# Patient Record
Sex: Female | Born: 1965
Health system: Southern US, Community
[De-identification: ages and names within clinical notes are randomized; demographics above are authoritative.]

## PROBLEM LIST (undated history)

## (undated) DIAGNOSIS — N183 Chronic kidney disease, stage 3 unspecified: Secondary | ICD-10-CM

## (undated) DIAGNOSIS — E079 Disorder of thyroid, unspecified: Secondary | ICD-10-CM

## (undated) DIAGNOSIS — M5136 Other intervertebral disc degeneration, lumbar region: Secondary | ICD-10-CM

## (undated) DIAGNOSIS — L309 Dermatitis, unspecified: Secondary | ICD-10-CM

## (undated) DIAGNOSIS — M545 Low back pain, unspecified: Secondary | ICD-10-CM

## (undated) DIAGNOSIS — M51369 Other intervertebral disc degeneration, lumbar region without mention of lumbar back pain or lower extremity pain: Secondary | ICD-10-CM

## (undated) DIAGNOSIS — K219 Gastro-esophageal reflux disease without esophagitis: Secondary | ICD-10-CM

## (undated) DIAGNOSIS — N3010 Interstitial cystitis (chronic) without hematuria: Secondary | ICD-10-CM

## (undated) DIAGNOSIS — R569 Unspecified convulsions: Secondary | ICD-10-CM

## (undated) DIAGNOSIS — Z860100 Personal history of colon polyps, unspecified: Secondary | ICD-10-CM

## (undated) DIAGNOSIS — T7840XA Allergy, unspecified, initial encounter: Secondary | ICD-10-CM

## (undated) DIAGNOSIS — I10 Essential (primary) hypertension: Secondary | ICD-10-CM

## (undated) DIAGNOSIS — E785 Hyperlipidemia, unspecified: Secondary | ICD-10-CM

## (undated) DIAGNOSIS — T783XXA Angioneurotic edema, initial encounter: Secondary | ICD-10-CM

## (undated) DIAGNOSIS — Z5189 Encounter for other specified aftercare: Secondary | ICD-10-CM

## (undated) DIAGNOSIS — G039 Meningitis, unspecified: Secondary | ICD-10-CM

## (undated) HISTORY — PX: BRAIN TUMOR EXCISION: SHX577

## (undated) HISTORY — DX: Personal history of colon polyps, unspecified: Z86.0100

## (undated) HISTORY — DX: Disorder of thyroid, unspecified: E07.9

## (undated) HISTORY — PX: TONSILLECTOMY: SUR1361

## (undated) HISTORY — DX: Gastro-esophageal reflux disease without esophagitis: K21.9

## (undated) HISTORY — DX: Other intervertebral disc degeneration, lumbar region: M51.36

## (undated) HISTORY — DX: Meningitis, unspecified: G03.9

## (undated) HISTORY — DX: Angioneurotic edema, initial encounter: T78.3XXA

## (undated) HISTORY — PX: ABDOMINAL HYSTERECTOMY: SHX81

## (undated) HISTORY — DX: Low back pain: M54.5

## (undated) HISTORY — DX: Allergy, unspecified, initial encounter: T78.40XA

## (undated) HISTORY — DX: Other intervertebral disc degeneration, lumbar region without mention of lumbar back pain or lower extremity pain: M51.369

## (undated) HISTORY — PX: CHOLECYSTECTOMY: SHX55

## (undated) HISTORY — DX: Interstitial cystitis (chronic) without hematuria: N30.10

## (undated) HISTORY — DX: Encounter for other specified aftercare: Z51.89

## (undated) HISTORY — DX: Unspecified convulsions: R56.9

## (undated) HISTORY — PX: OTHER SURGICAL HISTORY: SHX169

## (undated) HISTORY — DX: Essential (primary) hypertension: I10

## (undated) HISTORY — DX: Dermatitis, unspecified: L30.9

## (undated) HISTORY — DX: Chronic kidney disease, stage 3 unspecified: N18.30

## (undated) HISTORY — PX: POLYPECTOMY: SHX149

## (undated) HISTORY — DX: Low back pain, unspecified: M54.50

## (undated) HISTORY — PX: TUBAL LIGATION: SHX77

## (undated) HISTORY — DX: Personal history of colonic polyps: Z86.010

## (undated) HISTORY — DX: Hyperlipidemia, unspecified: E78.5

---

## 1997-12-15 ENCOUNTER — Emergency Department (HOSPITAL_COMMUNITY): Admission: EM | Admit: 1997-12-15 | Discharge: 1997-12-15 | Payer: Self-pay | Admitting: Emergency Medicine

## 1997-12-17 ENCOUNTER — Emergency Department (HOSPITAL_COMMUNITY): Admission: EM | Admit: 1997-12-17 | Discharge: 1997-12-17 | Payer: Self-pay | Admitting: Emergency Medicine

## 1999-03-14 ENCOUNTER — Emergency Department (HOSPITAL_COMMUNITY): Admission: EM | Admit: 1999-03-14 | Discharge: 1999-03-14 | Payer: Self-pay | Admitting: Emergency Medicine

## 1999-08-01 ENCOUNTER — Encounter: Payer: Self-pay | Admitting: Emergency Medicine

## 1999-08-01 ENCOUNTER — Emergency Department (HOSPITAL_COMMUNITY): Admission: EM | Admit: 1999-08-01 | Discharge: 1999-08-01 | Payer: Self-pay | Admitting: Emergency Medicine

## 2000-03-09 ENCOUNTER — Ambulatory Visit (HOSPITAL_COMMUNITY): Admission: RE | Admit: 2000-03-09 | Discharge: 2000-03-09 | Payer: Self-pay | Admitting: Family Medicine

## 2000-06-09 ENCOUNTER — Encounter: Payer: Self-pay | Admitting: Family Medicine

## 2000-06-09 ENCOUNTER — Encounter: Admission: RE | Admit: 2000-06-09 | Discharge: 2000-06-09 | Payer: Self-pay | Admitting: Family Medicine

## 2001-05-04 ENCOUNTER — Encounter: Admission: RE | Admit: 2001-05-04 | Discharge: 2001-05-04 | Payer: Self-pay | Admitting: Chiropractic Medicine

## 2001-05-04 ENCOUNTER — Encounter: Payer: Self-pay | Admitting: Chiropractic Medicine

## 2002-02-16 ENCOUNTER — Encounter: Payer: Self-pay | Admitting: Internal Medicine

## 2002-02-16 ENCOUNTER — Ambulatory Visit (HOSPITAL_COMMUNITY): Admission: RE | Admit: 2002-02-16 | Discharge: 2002-02-16 | Payer: Self-pay | Admitting: Internal Medicine

## 2002-02-17 ENCOUNTER — Encounter: Payer: Self-pay | Admitting: Internal Medicine

## 2002-02-17 ENCOUNTER — Ambulatory Visit (HOSPITAL_COMMUNITY): Admission: RE | Admit: 2002-02-17 | Discharge: 2002-02-17 | Payer: Self-pay | Admitting: Internal Medicine

## 2002-07-28 ENCOUNTER — Encounter: Admission: RE | Admit: 2002-07-28 | Discharge: 2002-07-28 | Payer: Self-pay | Admitting: Gastroenterology

## 2002-07-28 ENCOUNTER — Encounter: Payer: Self-pay | Admitting: Gastroenterology

## 2002-09-12 ENCOUNTER — Encounter: Payer: Self-pay | Admitting: Emergency Medicine

## 2002-09-12 ENCOUNTER — Emergency Department (HOSPITAL_COMMUNITY): Admission: EM | Admit: 2002-09-12 | Discharge: 2002-09-12 | Payer: Self-pay | Admitting: Emergency Medicine

## 2002-10-28 ENCOUNTER — Encounter: Payer: Self-pay | Admitting: Emergency Medicine

## 2002-10-28 ENCOUNTER — Emergency Department (HOSPITAL_COMMUNITY): Admission: EM | Admit: 2002-10-28 | Discharge: 2002-10-29 | Payer: Self-pay | Admitting: Emergency Medicine

## 2003-02-10 ENCOUNTER — Emergency Department (HOSPITAL_COMMUNITY): Admission: EM | Admit: 2003-02-10 | Discharge: 2003-02-10 | Payer: Self-pay

## 2003-11-24 ENCOUNTER — Ambulatory Visit (HOSPITAL_BASED_OUTPATIENT_CLINIC_OR_DEPARTMENT_OTHER): Admission: RE | Admit: 2003-11-24 | Discharge: 2003-11-24 | Payer: Self-pay | Admitting: Urology

## 2003-11-24 ENCOUNTER — Ambulatory Visit (HOSPITAL_COMMUNITY): Admission: RE | Admit: 2003-11-24 | Discharge: 2003-11-24 | Payer: Self-pay | Admitting: Urology

## 2004-05-07 ENCOUNTER — Ambulatory Visit: Payer: Self-pay | Admitting: Family Medicine

## 2004-05-31 ENCOUNTER — Ambulatory Visit: Payer: Self-pay | Admitting: Family Medicine

## 2004-06-03 ENCOUNTER — Encounter: Admission: RE | Admit: 2004-06-03 | Discharge: 2004-06-03 | Payer: Self-pay | Admitting: Family Medicine

## 2004-12-25 ENCOUNTER — Ambulatory Visit: Payer: Self-pay | Admitting: Family Medicine

## 2004-12-26 ENCOUNTER — Ambulatory Visit: Payer: Self-pay | Admitting: Family Medicine

## 2005-01-01 ENCOUNTER — Ambulatory Visit: Payer: Self-pay | Admitting: Family Medicine

## 2005-01-02 ENCOUNTER — Encounter: Admission: RE | Admit: 2005-01-02 | Discharge: 2005-01-02 | Payer: Self-pay | Admitting: Family Medicine

## 2005-03-18 ENCOUNTER — Encounter: Admission: RE | Admit: 2005-03-18 | Discharge: 2005-03-18 | Payer: Self-pay | Admitting: Family Medicine

## 2005-11-01 ENCOUNTER — Emergency Department (HOSPITAL_COMMUNITY): Admission: EM | Admit: 2005-11-01 | Discharge: 2005-11-01 | Payer: Self-pay | Admitting: Emergency Medicine

## 2005-12-30 ENCOUNTER — Ambulatory Visit: Payer: Self-pay | Admitting: Family Medicine

## 2005-12-30 LAB — CONVERTED CEMR LAB
AST: 13 units/L (ref 0–37)
Calcium: 9.1 mg/dL (ref 8.4–10.5)
Chloride: 103 meq/L (ref 96–112)
Creatinine, Ser: 0.7 mg/dL (ref 0.4–1.2)
Creatinine,U: 176.3 mg/dL
Glomerular Filtration Rate, Af Am: 119 mL/min/{1.73_m2}
Glucose, Bld: 82 mg/dL (ref 70–99)
HDL: 39.6 mg/dL (ref >39.0)
Hemoglobin: 14.8 g/dL (ref 12.0–15.0)
MCHC: 33.1 g/dL (ref 30.0–36.0)
MCV: 89.9 fL (ref 78.0–100.0)
Microalb Creat Ratio: 5.7 mg/g (ref 0.0–30.0)
Microalb, Ur: 1 mg/dL (ref 0.0–1.9)
Potassium: 4.2 meq/L (ref 3.5–5.1)
RBC: 4.97 M/uL (ref 3.87–5.11)
RDW: 14 % (ref 11.5–14.6)
Sodium: 143 meq/L (ref 135–145)
Triglyceride fasting, serum: 160 mg/dL — ABNORMAL HIGH (ref 0–149)

## 2006-01-14 ENCOUNTER — Ambulatory Visit: Payer: Self-pay | Admitting: Family Medicine

## 2006-09-11 DIAGNOSIS — I10 Essential (primary) hypertension: Secondary | ICD-10-CM | POA: Insufficient documentation

## 2006-09-11 DIAGNOSIS — Z86010 Personal history of colonic polyps: Secondary | ICD-10-CM

## 2006-09-11 DIAGNOSIS — J45909 Unspecified asthma, uncomplicated: Secondary | ICD-10-CM | POA: Insufficient documentation

## 2006-09-11 DIAGNOSIS — K219 Gastro-esophageal reflux disease without esophagitis: Secondary | ICD-10-CM

## 2006-10-28 ENCOUNTER — Emergency Department (HOSPITAL_COMMUNITY): Admission: EM | Admit: 2006-10-28 | Discharge: 2006-10-28 | Payer: Self-pay | Admitting: Emergency Medicine

## 2006-12-28 ENCOUNTER — Ambulatory Visit: Payer: Self-pay | Admitting: Family Medicine

## 2006-12-28 DIAGNOSIS — M545 Low back pain: Secondary | ICD-10-CM

## 2006-12-31 LAB — CONVERTED CEMR LAB
Alkaline Phosphatase: 68 units/L (ref 39–117)
BUN: 6 mg/dL (ref 6–23)
Basophils Relative: 0.3 % (ref 0.0–1.0)
Bilirubin, Direct: 0.1 mg/dL (ref 0.0–0.3)
CO2: 32 meq/L (ref 19–32)
Cholesterol: 180 mg/dL (ref 0–200)
Creatinine, Ser: 0.7 mg/dL (ref 0.4–1.2)
Direct LDL: 113.3 mg/dL
Eosinophils Relative: 0.5 % (ref 0.0–5.0)
GFR calc Af Amer: 118 mL/min
Glucose, Bld: 94 mg/dL (ref 70–99)
HCT: 43.9 % (ref 36.0–46.0)
Hemoglobin: 15.4 g/dL — ABNORMAL HIGH (ref 12.0–15.0)
Lymphocytes Relative: 14 % (ref 12.0–46.0)
Monocytes Absolute: 0.2 10*3/uL (ref 0.2–0.7)
Monocytes Relative: 1.6 % — ABNORMAL LOW (ref 3.0–11.0)
Neutro Abs: 11 10*3/uL — ABNORMAL HIGH (ref 1.4–7.7)
Neutrophils Relative %: 83.6 % — ABNORMAL HIGH (ref 43.0–77.0)
Nitrite: POSITIVE
Potassium: 4.1 meq/L (ref 3.5–5.1)
RDW: 13.9 % (ref 11.5–14.6)
Sodium: 141 meq/L (ref 135–145)
TSH: 2.35 microintl units/mL (ref 0.35–5.50)
Total Bilirubin: 0.4 mg/dL (ref 0.3–1.2)
Total CHOL/HDL Ratio: 5.5
Total Protein: 6.5 g/dL (ref 6.0–8.3)
Urobilinogen, UA: 0.2
VLDL: 44 mg/dL — ABNORMAL HIGH (ref 0–40)
WBC Urine, dipstick: NEGATIVE

## 2007-02-01 ENCOUNTER — Ambulatory Visit: Payer: Self-pay | Admitting: Family Medicine

## 2007-06-11 ENCOUNTER — Ambulatory Visit: Payer: Self-pay | Admitting: Family Medicine

## 2007-06-11 ENCOUNTER — Telehealth: Payer: Self-pay | Admitting: Family Medicine

## 2007-06-11 DIAGNOSIS — M129 Arthropathy, unspecified: Secondary | ICD-10-CM | POA: Insufficient documentation

## 2007-06-14 LAB — CONVERTED CEMR LAB
Albumin: 3.7 g/dL (ref 3.5–5.2)
Basophils Absolute: 0 10*3/uL (ref 0.0–0.1)
Basophils Relative: 0.3 % (ref 0.0–1.0)
Eosinophils Absolute: 0.1 10*3/uL (ref 0.0–0.7)
Hemoglobin: 14.7 g/dL (ref 12.0–15.0)
LH: 8 milliintl units/mL
MCHC: 34.6 g/dL (ref 30.0–36.0)
MCV: 84.5 fL (ref 78.0–100.0)
Neutro Abs: 8.3 10*3/uL — ABNORMAL HIGH (ref 1.4–7.7)
Neutrophils Relative %: 75.4 % (ref 43.0–77.0)
RBC: 5.01 M/uL (ref 3.87–5.11)
RDW: 13.8 % (ref 11.5–14.6)
Rhuematoid fact SerPl-aCnc: <20 intl units/mL — ABNORMAL LOW (ref 0.0–20.0)
Total Bilirubin: 0.6 mg/dL (ref 0.3–1.2)
Uric Acid, Serum: 5.4 mg/dL (ref 2.4–7.0)

## 2007-06-20 ENCOUNTER — Encounter: Admission: RE | Admit: 2007-06-20 | Discharge: 2007-06-20 | Payer: Self-pay | Admitting: Family Medicine

## 2007-06-22 DIAGNOSIS — M5137 Other intervertebral disc degeneration, lumbosacral region: Secondary | ICD-10-CM

## 2007-10-13 ENCOUNTER — Ambulatory Visit: Payer: Self-pay | Admitting: Family Medicine

## 2008-04-03 ENCOUNTER — Ambulatory Visit: Payer: Self-pay | Admitting: Family Medicine

## 2008-04-03 DIAGNOSIS — R1084 Generalized abdominal pain: Secondary | ICD-10-CM

## 2008-04-03 LAB — CONVERTED CEMR LAB
Nitrite: NEGATIVE
Urobilinogen, UA: 0.2
WBC Urine, dipstick: NEGATIVE

## 2008-04-05 LAB — CONVERTED CEMR LAB
AST: 11 units/L (ref 0–37)
Albumin: 3.8 g/dL (ref 3.5–5.2)
Alkaline Phosphatase: 72 units/L (ref 39–117)
BUN: 8 mg/dL (ref 6–23)
Eosinophils Relative: 0.6 % (ref 0.0–5.0)
GFR calc Af Amer: 117 mL/min
Glucose, Bld: 104 mg/dL — ABNORMAL HIGH (ref 70–99)
HCT: 41.3 % (ref 36.0–46.0)
Hemoglobin: 14.2 g/dL (ref 12.0–15.0)
Monocytes Absolute: 0.4 10*3/uL (ref 0.1–1.0)
Monocytes Relative: 3.6 % (ref 3.0–12.0)
Platelets: 213 10*3/uL (ref 150–400)
Potassium: 4 meq/L (ref 3.5–5.1)
RBC: 4.74 M/uL (ref 3.87–5.11)
Total CHOL/HDL Ratio: 4.6
Total Protein: 6.8 g/dL (ref 6.0–8.3)
Triglycerides: 192 mg/dL — ABNORMAL HIGH (ref 0–149)
WBC: 10.6 10*3/uL — ABNORMAL HIGH (ref 4.5–10.5)

## 2008-04-24 ENCOUNTER — Ambulatory Visit: Payer: Self-pay | Admitting: Family Medicine

## 2008-05-01 ENCOUNTER — Telehealth: Payer: Self-pay | Admitting: Family Medicine

## 2008-05-08 ENCOUNTER — Encounter: Payer: Self-pay | Admitting: Family Medicine

## 2008-07-26 ENCOUNTER — Ambulatory Visit: Payer: Self-pay | Admitting: Family Medicine

## 2008-07-26 DIAGNOSIS — F411 Generalized anxiety disorder: Secondary | ICD-10-CM

## 2008-11-27 ENCOUNTER — Ambulatory Visit: Payer: Self-pay | Admitting: Family Medicine

## 2008-11-27 DIAGNOSIS — L989 Disorder of the skin and subcutaneous tissue, unspecified: Secondary | ICD-10-CM

## 2009-04-24 ENCOUNTER — Ambulatory Visit: Payer: Self-pay | Admitting: Family Medicine

## 2009-04-24 LAB — CONVERTED CEMR LAB
Glucose, Urine, Semiquant: NEGATIVE
Protein, U semiquant: 30
Urobilinogen, UA: 0.2
WBC Urine, dipstick: NEGATIVE

## 2009-04-25 LAB — CONVERTED CEMR LAB
Albumin: 3.7 g/dL (ref 3.5–5.2)
Alkaline Phosphatase: 59 units/L (ref 39–117)
BUN: 15 mg/dL (ref 6–23)
Basophils Absolute: 0.1 10*3/uL (ref 0.0–0.1)
Bilirubin, Direct: 0 mg/dL (ref 0.0–0.3)
CO2: 33 meq/L — ABNORMAL HIGH (ref 19–32)
Calcium: 9.2 mg/dL (ref 8.4–10.5)
Creatinine, Ser: 0.8 mg/dL (ref 0.4–1.2)
Eosinophils Absolute: 0.1 10*3/uL (ref 0.0–0.7)
Glucose, Bld: 133 mg/dL — ABNORMAL HIGH (ref 70–99)
INR: 0.9 (ref 0.8–1.0)
Lymphocytes Relative: 21.2 % (ref 12.0–46.0)
MCHC: 32.4 g/dL (ref 30.0–36.0)
Neutrophils Relative %: 70.9 % (ref 43.0–77.0)
Prothrombin Time: 9.6 s (ref 9.1–11.7)
RDW: 13.5 % (ref 11.5–14.6)
aPTT: 25.4 s (ref 21.7–28.8)

## 2009-04-30 HISTORY — PX: OTHER SURGICAL HISTORY: SHX169

## 2009-05-02 ENCOUNTER — Observation Stay (HOSPITAL_COMMUNITY): Admission: EM | Admit: 2009-05-02 | Discharge: 2009-05-03 | Payer: Self-pay | Admitting: Emergency Medicine

## 2009-05-02 ENCOUNTER — Ambulatory Visit: Payer: Self-pay | Admitting: Family Medicine

## 2009-05-02 DIAGNOSIS — T783XXA Angioneurotic edema, initial encounter: Secondary | ICD-10-CM

## 2009-05-02 DIAGNOSIS — E119 Type 2 diabetes mellitus without complications: Secondary | ICD-10-CM

## 2009-05-02 DIAGNOSIS — L500 Allergic urticaria: Secondary | ICD-10-CM | POA: Insufficient documentation

## 2009-05-03 ENCOUNTER — Encounter: Payer: Self-pay | Admitting: Family Medicine

## 2009-05-10 ENCOUNTER — Ambulatory Visit: Payer: Self-pay | Admitting: Family Medicine

## 2009-05-21 ENCOUNTER — Encounter: Payer: Self-pay | Admitting: Family Medicine

## 2009-06-07 ENCOUNTER — Ambulatory Visit: Payer: Self-pay | Admitting: Family Medicine

## 2009-06-07 DIAGNOSIS — L259 Unspecified contact dermatitis, unspecified cause: Secondary | ICD-10-CM | POA: Insufficient documentation

## 2009-09-11 ENCOUNTER — Ambulatory Visit: Payer: Self-pay | Admitting: Family Medicine

## 2009-09-11 DIAGNOSIS — E1140 Type 2 diabetes mellitus with diabetic neuropathy, unspecified: Secondary | ICD-10-CM | POA: Insufficient documentation

## 2009-09-14 ENCOUNTER — Ambulatory Visit: Payer: Self-pay | Admitting: Family Medicine

## 2009-09-14 DIAGNOSIS — E538 Deficiency of other specified B group vitamins: Secondary | ICD-10-CM | POA: Insufficient documentation

## 2009-09-14 DIAGNOSIS — E781 Pure hyperglyceridemia: Secondary | ICD-10-CM

## 2009-09-14 LAB — CONVERTED CEMR LAB
Albumin: 3.6 g/dL (ref 3.5–5.2)
Basophils Absolute: 0 10*3/uL (ref 0.0–0.1)
Basophils Relative: 0.3 % (ref 0.0–3.0)
CO2: 32 meq/L (ref 19–32)
Calcium: 9.1 mg/dL (ref 8.4–10.5)
Chloride: 96 meq/L (ref 96–112)
Eosinophils Absolute: 0.1 10*3/uL (ref 0.0–0.7)
Glucose, Bld: 145 mg/dL — ABNORMAL HIGH (ref 70–99)
HCT: 37.9 % (ref 36.0–46.0)
Hemoglobin: 13.1 g/dL (ref 12.0–15.0)
Hgb A1c MFr Bld: 6.6 % — ABNORMAL HIGH (ref 4.6–6.5)
Lymphocytes Relative: 18.5 % (ref 12.0–46.0)
Lymphs Abs: 1.7 10*3/uL (ref 0.7–4.0)
MCHC: 34.5 g/dL (ref 30.0–36.0)
MCV: 86.2 fL (ref 78.0–100.0)
Microalb, Ur: 1.1 mg/dL (ref 0.0–1.9)
Monocytes Absolute: 0.4 10*3/uL (ref 0.1–1.0)
Neutro Abs: 6.8 10*3/uL (ref 1.4–7.7)
RDW: 14.8 % — ABNORMAL HIGH (ref 11.5–14.6)
Sodium: 139 meq/L (ref 135–145)
TSH: 1.59 microintl units/mL (ref 0.35–5.50)
Total Protein: 6.6 g/dL (ref 6.0–8.3)
Triglycerides: 696 mg/dL — ABNORMAL HIGH (ref 0.0–149.0)
Vitamin B-12: 97 pg/mL — ABNORMAL LOW (ref 211–911)

## 2009-11-28 ENCOUNTER — Ambulatory Visit: Payer: Self-pay | Admitting: Family Medicine

## 2009-11-28 DIAGNOSIS — M542 Cervicalgia: Secondary | ICD-10-CM | POA: Insufficient documentation

## 2009-11-28 DIAGNOSIS — M5412 Radiculopathy, cervical region: Secondary | ICD-10-CM | POA: Insufficient documentation

## 2010-02-26 NOTE — Assessment & Plan Note (Signed)
Summary: 1 month fup//ccm   Vital Signs:  Patient profile:   45 year old female Weight:      245 pounds O2 Sat:      98 % on Room air Temp:     98.0 degrees F oral BP sitting:   150 / 88  (left arm) Cuff size:   regular  Vitals Entered By: Raechel Ache, RN (Jun 07, 2009 8:55 AM)  O2 Flow:  Room air CC: 1 mo ROV. BS's 80-130. C/o face breaking out.   History of Present Illness: Here to follow up HTN and DM. She feels much better with more energy. She has lost about 20 lbs in the past 3 months. her BP at home remains in the 140s systolic and 80s diastolic. Her random glucoses run from 80 to 130. She has ha dsome itchy dry patches of skin on the arms, legs, and face.   Allergies: 1)  ! Doxycycline 2)  ! Tylox 3)  ! Codeine 4)  ! * Albuterol 5)  ! Lisinopril 6)  ! Flagyl 7)  ! * Zofran 8)  ! Vancomycin  Past History:  Past Medical History: Allergies Asthma GERD Hypertension Meningitis Colonic polyps, hx of Low back pain interstitial cystitis, per Dr. Isabel Caprice Diabetes mellitus, type II angioedema eczema  Review of Systems  The patient denies anorexia, fever, weight gain, vision loss, decreased hearing, hoarseness, chest pain, syncope, dyspnea on exertion, peripheral edema, prolonged cough, headaches, hemoptysis, abdominal pain, melena, hematochezia, severe indigestion/heartburn, hematuria, incontinence, genital sores, muscle weakness, suspicious skin lesions, transient blindness, difficulty walking, depression, unusual weight change, abnormal bleeding, enlarged lymph nodes, angioedema, breast masses, and testicular masses.    Physical Exam  General:  overweight-appearing.  Gets around very well using her cane Neck:  No deformities, masses, or tenderness noted. Lungs:  Normal respiratory effort, chest expands symmetrically. Lungs are clear to auscultation, no crackles or wheezes. Heart:  Normal rate and regular rhythm. S1 and S2 normal without gallop, murmur, click,  rub or other extra sounds. Extremities:  trace left pedal edema and trace right pedal edema.     Impression & Recommendations:  Problem # 1:  DIABETES MELLITUS, TYPE II (ICD-250.00)  Her updated medication list for this problem includes:    Aspir-low 81 Mg Tbec (Aspirin) .[name]... 1 once daily  Problem # 2:  HYPERTENSION (ICD-401.9)  Her updated medication list for this problem includes:    Diltiazem Hcl Er Beads 240 Mg Xr24h-cap (Diltiazem hcl er beads) .[name]... 1 once daily    Hydrochlorothiazide 25 Mg Tabs (Hydrochlorothiazide) ..... Once daily  Problem # 3:  ASTHMA (ICD-493.90)  Problem # 4:  ECZEMA (ICD-692.9)  The following medications were removed from the medication list:    Cortizone-10/aloe 1 % Crea (Hydrocortisone-aloe vera) .[name]... As needed Her updated medication list for this problem includes:    Promethazine Hcl 25 Mg Tabs (Promethazine hcl) .[name]... Take 1/2 every six hours as needed nausea and vomiting    Benadryl 25 Mg Tabs (Diphenhydramine hcl) .[name]... As needed    Triamcinolone Acetonide 0.1 % Crea (Triamcinolone acetonide) .[name]... Apply two times a day as needed  Complete Medication List: 1)  Promethazine Hcl 25 Mg Tabs (Promethazine hcl) .... Take 1/2 every six hours as needed nausea and vomiting 2)  Magic Mouthwash  .... Swish ans spit 15ml by mouth every six hours as needed 3)  Benadryl 25 Mg Tabs (Diphenhydramine hcl) .... As needed 4)  Sertraline Hcl 50 Mg Tabs (Sertraline hcl) .Marland KitchenMarland KitchenMarland Kitchen  1 once daily 5)  Tramadol Hcl 50 Mg Tabs (Tramadol hcl) .[name].. 1 q8h as needed 6)  Aspir-low 81 Mg Tbec (Aspirin) .[name].. 1 once daily 7)  Diltiazem Hcl Er Beads 240 Mg Xr24h-cap (Diltiazem hcl er beads) .[name].. 1 once daily 8)  Omeprazole 20 Mg Cpdr (Omeprazole) .[name].. 1 once daily 9)  [facility]vs Ultra Thin Lancets Misc (Lancets) .... Uad 10)  Truetest Test Strp (Glucose blood) .... Uad 11)  Hydrochlorothiazide 25 Mg Tabs (Hydrochlorothiazide) .... Once daily 12)  Triamcinolone Acetonide 0.1 % Crea  (Triamcinolone acetonide) .... Apply two times a day as needed  Patient Instructions: 1)  Add HCTZ to the Diltiazem. Use Triamcinolone for her skin.  2)  Please schedule a follow-up appointment in 3 months .  Prescriptions: TRIAMCINOLONE ACETONIDE 0.1 % CREA (TRIAMCINOLONE ACETONIDE) apply two times a day as needed  #60 x 5   Entered and Authorized by:   Nelwyn Salisbury MD   Signed by:   Nelwyn Salisbury MD on 06/07/2009   Method used:   Electronically to        [facility]  [address] [phone][facility] (retail)       [address]       [place], [place]  [address]       Ph: [phone] or [phone]       Fax: [phone]   RxID:   [id number] HYDROCHLOROTHIAZIDE 25 MG TABS (HYDROCHLOROTHIAZIDE) once daily  #30 x 11   Entered and Authorized by:   Nelwyn Salisbury MD   Signed by:   Nelwyn Salisbury MD on 06/07/2009   Method used:   [facility]ronically to        [facility]  [address] [phone][facility] (retail)       [address]       [place], [place]  [address]       Ph: [phone] or [phone]       Fax: [phone]   RxID:   [id number]

## 2010-02-26 NOTE — Assessment & Plan Note (Signed)
Summary: RECTAL BLEEDING // RS   Vital Signs:  Patient profile:   45 year old female Weight:      229 pounds BMI:     42.04 Temp:     97.7 degrees F oral Pulse rate:   78 / minute Pulse rhythm:   regular BP sitting:   148 / 86  (left arm) Cuff size:   regular  Vitals Entered By: Raechel Ache, RN (September 14, 2009 10:21 AM) CC: C/o bright red rectal bleeding for some time, but heavier past 2 weeks, nausea and weight loss.   History of Present Illness: Here for 2 weeks of daily bright red blood from the rectum. She did not tell me about this when I saw her a few days ago. She has a hx of external hemorrhoids and colon polyps, and her last colonoscopy was just last year. Most of this bleeding occurs with BMs, but she has some seepage of blood in between Bms as well. No pain. Also she recentl had lbas here for her chronic fatigue, and we found her B12 level to be quite low. Also her TG were quite high.   Allergies: 1)  ! Doxycycline 2)  ! Tylox 3)  ! Codeine 4)  ! * Albuterol 5)  ! Lisinopril 6)  ! Flagyl 7)  ! * Zofran 8)  ! Vancomycin  Past History:  Past Medical History: Reviewed history from 06/07/2009 and no changes required. Allergies Asthma GERD Hypertension Meningitis Colonic polyps, hx of Low back pain interstitial cystitis, per Dr. Isabel Caprice Diabetes mellitus, type II angioedema eczema  Past Surgical History: Reviewed history from 05/10/2009 and no changes required. Hysterectomy BTL Brain Tumor, benign 1984 SVD x2 Tonsillectomy colonoscopy 05-08-08  per Dr. Matthias Hughs, benign polyps, repeat 5 yrs EGD 9-04 per Dr. Matthias Hughs, GERD only removal of a cystic mass from her abdomen 04-30-09 per Dr. Hardin Negus at [facility]  Review of Systems  The patient denies anorexia, fever, weight loss, weight gain, vision loss, decreased hearing, hoarseness, chest pain, syncope, dyspnea on exertion, peripheral edema, prolonged cough, headaches, hemoptysis, abdominal  pain, melena, severe indigestion/heartburn, hematuria, incontinence, genital sores, muscle weakness, suspicious skin lesions, transient blindness, difficulty walking, depression, unusual weight change, abnormal bleeding, enlarged lymph nodes, angioedema, breast masses, and testicular masses.    Physical Exam  General:  overweight-appearing.   Lungs:  Normal respiratory effort, chest expands symmetrically. Lungs are clear to auscultation, no crackles or wheezes. Heart:  Normal rate and regular rhythm. S1 and S2 normal without gallop, murmur, click, rub or other extra sounds. Abdomen:  Bowel sounds positive,abdomen soft and non-tender without masses, organomegaly or hernias noted. Rectal:  several external hemorrhoids. On digital exam stool was heme positive, no masses felt   Impression & Recommendations:  Problem # 1:  RECTAL BLEEDING (ICD-569.3)  Orders: Gastroenterology Referral (GI)  Problem # 2:  VITAMIN B12 DEFICIENCY (ICD-266.2)  Orders: Vit B12 1000 mcg (J3420) Admin of Therapeutic Inj  intramuscular or subcutaneous ([address])  Problem # 3:  HYPERTRIGLYCERIDEMIA (ICD-272.1)  Her updated medication list for this problem includes:    Fenofibrate 160 Mg Tabs (Fenofibrate) ..... Once daily  Complete Medication List: 1)  Promethazine Hcl 25 Mg Tabs (Promethazine hcl) .... Take 1/2 every six hours as needed nausea and vomiting 2)  Magic Mouthwash  .... Swish ans spit 15ml by mouth every six hours as needed 3)  Benadryl 25 Mg Tabs (Diphenhydramine hcl) .... As needed 4)  Sertraline Hcl 50 Mg Tabs (Sertraline hcl) .Marland KitchenMarland KitchenMarland Kitchen  1 once daily 5)  Tramadol Hcl 50 Mg Tabs (Tramadol hcl) .[name].. 1 q8h as needed 6)  Aspir-low 81 Mg Tbec (Aspirin) .[name].. 1 once daily 7)  Diltiazem Hcl Er Beads 240 Mg Xr24h-cap (Diltiazem hcl er beads) .[name].. 1 once daily 8)  Omeprazole 20 Mg Cpdr (Omeprazole) .[name].. 1 once daily 9)  [facility]vs Ultra Thin Lancets Misc (Lancets) .... Uad 10)  Truetest Test Strp (Glucose blood) ....  Uad 11)  Hydrochlorothiazide 25 Mg Tabs (Hydrochlorothiazide) .... Once daily 12)  Triamcinolone Acetonide 0.1 % Crea (Triamcinolone acetonide) .... Apply two times a day as needed 13)  Permethrin 5 % Crea (Permethrin) .... Apply as directed 14)  Neurontin 300 Mg Caps (Gabapentin) .... Two times a day 15)  Fenofibrate 160 Mg Tabs (Fenofibrate) .... Once daily 16)  Cyanocobalamin 1000 Mcg/ml Soln (Cyanocobalamin) .... Give one ml per week 17)  Monoject Syringe 25g X 1-1/4" 3 Ml Misc (Syringe/needle (disp)) .... As directed  Patient Instructions: 1)  Start weekly B12 shots. Her husband will give these to her. recheck a B12 level in 6 months. Start on Fenofibrate. Refer to a new GI closer to her home.  Prescriptions: MONOJECT SYRINGE 25G X 1-1/4" 3 ML MISC (SYRINGE/NEEDLE (DISP)) as directed  #30 x 11   Entered and Authorized by:   Nelwyn Salisbury MD   Signed by:   Nelwyn Salisbury MD on 09/14/2009   Method used:   [facility]ronically to        [facility]  [address] [phone]* (retail)       [address]       [place], [place]  [address]       Ph: [phone] or [phone]       Fax: [phone]   RxID:   [id number] CYANOCOBALAMIN 1000 MCG/ML SOLN (CYANOCOBALAMIN) give one ml per week  #30 x 11   Entered and Authorized by:   Nelwyn Salisbury MD   Signed by:   Nelwyn Salisbury MD on 09/14/2009   Method used:   [facility]ronically to        [facility]  [address] [phone]* (retail)       [address]       [place], [place]  [address]       Ph: [phone] or [phone]       Fax: [phone]   RxID:   [id number] FENOFIBRATE 160 MG TABS (FENOFIBRATE) once daily  #30 x 11   Entered and Authorized by:   Nelwyn Salisbury MD   Signed by:   Nelwyn Salisbury MD on 09/14/2009   Method used:   [facility]        [facility]  [address] [phone]* (retail)       [address]       [place], [place]  [address]       Ph: [phone] or [phone]       Fax: [phone]   RxID:    [id number]    Medication Administration  Injection # 1:    Medication: Vit B12 1000 mcg    Diagnosis: VITAMIN B12 DEFICIENCY (ICD-266.2)    Route: IM    Site: L deltoid    Exp Date: 04/13    Lot #: [id number]    Mfr: [employer]    Patient tolerated injection without complications  Given by: Raechel Ache, RN (September 14, 2009 11:27 AM)  Orders Added: 1)  Vit B12 1000 mcg [J3420] 2)  Admin of Therapeutic Inj  intramuscular or subcutaneous [96372] 3)  Est. Patient Level IV [89381] 4)  Gastroenterology Referral [GI]

## 2010-02-26 NOTE — Assessment & Plan Note (Signed)
Summary: 1 WK ROV (PT REQ F/U ON ALLERGIC REACTION?) // RS   Vital Signs:  Patient profile:   45 year old female Weight:      241 pounds BMI:     44.24 Temp:     97.6 degrees F oral BP sitting:   150 / 78  (left arm) Cuff size:   regular  Vitals Entered By: Raechel Ache, RN (May 10, 2009 9:04 AM) CC: Hosp f/u- feels better but tired. Not taking Glipizide.   History of Present Illness: Here to follow up a hospital stay from 05-02-09 to 05-03-09 for urticaria and anaphylaxis, presumably from medications. She had been given several different meds a few days before that at [facility], and these included Zofran, Flagyl, and Vancomycin. The most likely culprit was Lisinopril, so of course she was taken off this. Also during this period her glucoses went up, and she was diagnosed with diabetes. Her A1c was only 6.5 however. She was started on Glipizide for this. Since coming home her glucoses bottomed out to the 50s, so she stopped the Glipizide. Now she feels better, and her glucoses are running from 110 to 150. The rash and swelling have all resolved, and she feels well in general. There is some confusion on her meds however, and there is some duplication on them since coming home. She has been on two CCBs and two SSRIs.   Allergies: 1)  ! Doxycycline 2)  ! Tylox 3)  ! Codeine 4)  ! * Albuterol 5)  ! Lisinopril 6)  ! Flagyl 7)  ! * Zofran 8)  ! Vancomycin  Past History:  Past Medical History: Reviewed history from 05/02/2009 and no changes required. Allergies Asthma GERD Hypertension Meningitis Colonic polyps, hx of Low back pain interstitial cystitis, per Dr. Isabel Caprice Diabetes mellitus, type II angioedema  Past Surgical History: Hysterectomy BTL Brain Tumor, benign 1984 SVD x2 Tonsillectomy colonoscopy 05-08-08  per Dr. Matthias Hughs, benign polyps, repeat 5 yrs EGD 9-04 per Dr. Matthias Hughs, GERD only removal of a cystic mass from her abdomen 04-30-09 per Dr. Hardin Negus at  [facility]  Review of Systems  The patient denies anorexia, fever, weight loss, weight gain, vision loss, decreased hearing, hoarseness, chest pain, syncope, dyspnea on exertion, peripheral edema, prolonged cough, headaches, hemoptysis, abdominal pain, melena, hematochezia, severe indigestion/heartburn, hematuria, incontinence, genital sores, muscle weakness, suspicious skin lesions, transient blindness, difficulty walking, depression, unusual weight change, abnormal bleeding, enlarged lymph nodes, angioedema, breast masses, and testicular masses.    Physical Exam  General:  Well-developed,well-nourished,in no acute distress; alert,appropriate and cooperative throughout examination. Walks with her cane Head:  Normocephalic and atraumatic without obvious abnormalities. No apparent alopecia or balding. Eyes:  No corneal or conjunctival inflammation noted. EOMI. Perrla. Funduscopic exam benign, without hemorrhages, exudates or papilledema. Vision grossly normal. Ears:  External ear exam shows no significant lesions or deformities.  Otoscopic examination reveals clear canals, tympanic membranes are intact bilaterally without bulging, retraction, inflammation or discharge. Hearing is grossly normal bilaterally. Nose:  External nasal examination shows no deformity or inflammation. Nasal mucosa are pink and moist without lesions or exudates. Mouth:  Oral mucosa and oropharynx without lesions or exudates.  Teeth in good repair. Neck:  No deformities, masses, or tenderness noted. Lungs:  Normal respiratory effort, chest expands symmetrically. Lungs are clear to auscultation, no crackles or wheezes. Heart:  Normal rate and regular rhythm. S1 and S2 normal without gallop, murmur, click, rub or other extra sounds. Abdomen:  Bowel sounds  positive,abdomen soft and non-tender without masses, organomegaly or hernias noted. Skin:  Intact without suspicious lesions or rashes   Impression &  Recommendations:  Problem # 1:  ALLERGIC URTICARIA (ICD-708.0) Assessment Improved  Problem # 2:  ANGIOEDEMA (ICD-995.1) Assessment: Improved  Problem # 3:  DIABETES MELLITUS, TYPE II (ICD-250.00) Assessment: Improved  The following medications were removed from the medication list:    Glipizide Xl 5 Mg Xr24h-tab (Glipizide) .[name]... Take 1 tablet by mouth once a day for sugar Her updated medication list for this problem includes:    Aspir-low 81 Mg Tbec (Aspirin) .[name]... 1 once daily  Problem # 4:  ANXIETY (ICD-300.00) Assessment: Unchanged  The following medications were removed from the medication list:    Citalopram Hydrobromide 20 Mg Tabs (Citalopram hydrobromide) ..... One tablet every morning Her updated medication list for this problem includes:    Sertraline Hcl 50 Mg Tabs (Sertraline hcl) .[name]... 1 once daily  Problem # 5:  HYPERTENSION (ICD-401.9) Assessment: Unchanged  The following medications were removed from the medication list:    Amlodipine Besylate 5 Mg Tabs (Amlodipine besylate) ..... One tablet every morning Her updated medication list for this problem includes:    Diltiazem Hcl Er Beads 240 Mg Xr24h-cap (Diltiazem hcl er beads) .[name]... 1 once daily  Complete Medication List: 1)  Promethazine Hcl 25 Mg Tabs (Promethazine hcl) .... Take 1/2 every six hours as needed nausea and vomiting 2)  Magic Mouthwash  .... Swish ans spit 15ml by mouth every six hours as needed 3)  Benadryl 25 Mg Tabs (Diphenhydramine hcl) .... As needed 4)  Cortizone-10/aloe 1 % Crea (Hydrocortisone-aloe vera) .... As needed 5)  Sertraline Hcl 50 Mg Tabs (Sertraline hcl) .[name].. 1 once daily 6)  Tramadol Hcl 50 Mg Tabs (Tramadol hcl) .[name].. 1 q8h as needed 7)  Aspir-low 81 Mg Tbec (Aspirin) .[name].. 1 once daily 8)  Diltiazem Hcl Er Beads 240 Mg Xr24h-cap (Diltiazem hcl er beads) .[name].. 1 once daily 9)  Omeprazole 20 Mg Cpdr (Omeprazole) .[name].. 1 once daily  Patient Instructions: 1)  Continue to watch  the diet very closely. Check glucoses. Get some exercise. We got her medications straightened out. 2)  Please schedule a follow-up appointment in 1 month.

## 2010-02-26 NOTE — Assessment & Plan Note (Signed)
Summary: RASH // RS   Vital Signs:  Patient profile:   45 year old female Weight:      233 pounds BMI:     42.77 O2 Sat:      93 % Temp:     98.0 degrees F oral BP sitting:   122 / 82  (left arm) Cuff size:   regular  Vitals Entered By: Raechel Ache, RN (September 11, 2009 9:40 AM) CC: C/o red, itchy rash all over which scabs. Hands get numb and feet have sharp pains. Dizzy at times.   History of Present Illness: Here for several months of an intensely itchy rash over the arms, legs, and trunk. Triamcinolone cream does not help. She continues to lose weight, but she admits to a lot of dietary indiscretions. Her am fasting glucoses have been in the mid to high 100s lately. She has had burning, numbness, and tingling in the hands and feet for a month or so.   Allergies: 1)  ! Doxycycline 2)  ! Tylox 3)  ! Codeine 4)  ! * Albuterol 5)  ! Lisinopril 6)  ! Flagyl 7)  ! * Zofran 8)  ! Vancomycin  Past History:  Past Medical History: Reviewed history from 06/07/2009 and no changes required. Allergies Asthma GERD Hypertension Meningitis Colonic polyps, hx of Low back pain interstitial cystitis, per Dr. Isabel Caprice Diabetes mellitus, type II angioedema eczema  Review of Systems  The patient denies anorexia, fever, weight gain, vision loss, decreased hearing, hoarseness, chest pain, syncope, dyspnea on exertion, peripheral edema, prolonged cough, headaches, hemoptysis, abdominal pain, melena, hematochezia, severe indigestion/heartburn, hematuria, incontinence, genital sores, muscle weakness, suspicious skin lesions, transient blindness, difficulty walking, depression, unusual weight change, abnormal bleeding, enlarged lymph nodes, angioedema, breast masses, and testicular masses.    Physical Exam  General:  overweight-appearing.   Neck:  No deformities, masses, or tenderness noted. Lungs:  Normal respiratory effort, chest expands symmetrically. Lungs are clear to auscultation,  no crackles or wheezes. Heart:  Normal rate and regular rhythm. S1 and S2 normal without gallop, murmur, click, rub or other extra sounds. Neurologic:  alert & oriented X3, cranial nerves II-XII intact, and gait normal.   Skin:  widespread rash as above consisting of single red papular lesions, many of which are excoriated   Impression & Recommendations:  Problem # 1:  PERIPHERAL NEUROPATHY (ICD-356.9)  Orders: TLB-B12, Serum-Total ONLY (01027-O53)  Problem # 2:  SCABIES (ICD-133.0)  Problem # 3:  DIABETES MELLITUS, TYPE II (ICD-250.00)  Her updated medication list for this problem includes:    Aspir-low 81 Mg Tbec (Aspirin) .[name]... 1 once daily  Orders: Venipuncture (66440) TLB-Lipid Panel (80061-LIPID) TLB-BMP (Basic Metabolic Panel-BMET) (80048-METABOL) TLB-CBC Platelet - w/Differential (85025-CBCD) TLB-Hepatic/Liver Function Pnl (80076-HEPATIC) TLB-TSH (Thyroid Stimulating Hormone) (84443-TSH) TLB-A1C / Hgb A1C (Glycohemoglobin) (83036-A1C) TLB-Microalbumin/Creat Ratio, Urine (82043-MALB)  Problem # 4:  ANXIETY (ICD-300.00)  Her updated medication list for this problem includes:    Sertraline Hcl 50 Mg Tabs (Sertraline hcl) .[name]... 1 once daily  Complete Medication List: 1)  Promethazine Hcl 25 Mg Tabs (Promethazine hcl) .... Take 1/2 every six hours as needed nausea and vomiting 2)  Magic Mouthwash  .... Swish ans spit 15ml by mouth every six hours as needed 3)  Benadryl 25 Mg Tabs (Diphenhydramine hcl) .... As needed 4)  Sertraline Hcl 50 Mg Tabs (Sertraline hcl) .[name].. 1 once daily 5)  Tramadol Hcl 50 Mg Tabs (Tramadol hcl) .[name].. 1 q8h as needed 6)  Aspir-low 81  Mg Tbec (Aspirin) .[name].. 1 once daily 7)  Diltiazem Hcl Er Beads 240 Mg Xr24h-cap (Diltiazem hcl er beads) .[name].. 1 once daily 8)  Omeprazole 20 Mg Cpdr (Omeprazole) .[name].. 1 once daily 9)  [facility]vs Ultra Thin Lancets Misc (Lancets) .... Uad 10)  Truetest Test Strp (Glucose blood) .... Uad 11)  Hydrochlorothiazide 25 Mg  Tabs (Hydrochlorothiazide) .... Once daily 12)  Triamcinolone Acetonide 0.1 % Crea (Triamcinolone acetonide) .... Apply two times a day as needed 13)  Permethrin 5 % Crea (Permethrin) .... Apply as directed 14)  Neurontin 300 Mg Caps (Gabapentin) .... Two times a day  Patient Instructions: 1)  get labs today. It sounds like her diabetes is not well controlled.  Prescriptions: NEURONTIN 300 MG CAPS (GABAPENTIN) two times a day  #60 x 11   Entered and Authorized by:   Nelwyn Salisbury MD   Signed by:   Nelwyn Salisbury MD on 09/11/2009   Method used:   [facility]  [address] [phone]* (retail)       [address]       [place], [place]  [address]       Ph: [phone] or [phone]       Fax: [phone]   RxID:   [id number] PERMETHRIN 5 % CREA (PERMETHRIN) apply as directed  #2 x 2   Entered and Authorized by:   Nelwyn Salisbury MD   Signed by:   Nelwyn Salisbury MD on 09/11/2009   Method used:   [facility]  [address] [phone]* (retail)       [address]       [place], [place]  [address]       Ph: [phone] or [phone]       Fax: [phone]   RxID:   [id number]   Appended Document: Orders Update    Clinical Lists Changes  Orders: Added new Service order of Specimen Handling (73710) - Signed Added new Service order of Specimen Handling ([id number]) - Signed

## 2010-02-26 NOTE — Assessment & Plan Note (Signed)
Summary: renew meds//ccm   Vital Signs:  Patient profile:   45 year old female Weight:      248 pounds BMI:     45.52 Temp:     97.4 degrees F oral BP sitting:   122 / 84  (left arm) Cuff size:   regular  Vitals Entered By: Raechel Ache, RN (April 24, 2009 9:00 AM) CC: Needs refills. C/o abdominal pain and diarrhea and says she sees blood. C/o sore on abd which bleeds. Lots of bruising.   History of Present Illness: Here with her husband for 2 weeks of generalized weakness, low grade fevers, diffuse abdominal pains, diarrhea, and some difficulty passing urine. No nausea or vomitting. About 6 weeks ago she had an abcessed tooth pulled by her oral surgeon, and she was on 2 different antibiotics. Her mouth has returned to normal now. She also mentions frequenbt bruising over parts of her body. her daughter was recently diagnosed with thrombotic thrombocytopenic purpura, and she wonders about her own platelet status.   Allergies: 1)  ! Doxycycline 2)  ! * Antibiotics 3)  ! Tylox 4)  ! Codeine 5)  ! * Albuterol  Past History:  Past Medical History: Reviewed history from 02/01/2007 and no changes required. Allergies Asthma GERD Hypertension Meningitis Colonic polyps, hx of Low back pain interstitial cystitis, per Dr. Isabel Caprice  Past Surgical History: Reviewed history from 07/26/2008 and no changes required. Hysterectomy BTL Brain Tumor, benign 1984 SVD x2 Tonsillectomy colonoscopy 05-08-08  per Dr. Matthias Hughs, benign polyps, repeat 5 yrs EGD 9-04 per Dr. Matthias Hughs, GERD only  Review of Systems  The patient denies anorexia, fever, weight loss, weight gain, vision loss, decreased hearing, hoarseness, chest pain, syncope, dyspnea on exertion, peripheral edema, prolonged cough, headaches, hemoptysis, melena, hematochezia, severe indigestion/heartburn, hematuria, incontinence, genital sores, muscle weakness, suspicious skin lesions, transient blindness, difficulty walking,  depression, unusual weight change, abnormal bleeding, enlarged lymph nodes, angioedema, breast masses, and testicular masses.    Physical Exam  General:  overweight-appearing.   Neck:  No deformities, masses, or tenderness noted. Lungs:  Normal respiratory effort, chest expands symmetrically. Lungs are clear to auscultation, no crackles or wheezes. Heart:  Normal rate and regular rhythm. S1 and S2 normal without gallop, murmur, click, rub or other extra sounds. Abdomen:  soft, normal bowel sounds, no distention, no masses, no guarding, no rigidity, no rebound tenderness, no abdominal hernia, no inguinal hernia, no hepatomegaly, and no splenomegaly.  Diffusely tender, more so in both lower quadrants.  Skin:  several small superficial boils on the abdomen. Several small ecchymotic areas on her legs.  Cervical Nodes:  No lymphadenopathy noted Axillary Nodes:  No palpable lymphadenopathy Inguinal Nodes:  No significant adenopathy   Impression & Recommendations:  Problem # 1:  ABDOMINAL PAIN, GENERALIZED (ICD-789.07)  Orders: Venipuncture (16109) TLB-BMP (Basic Metabolic Panel-BMET) (80048-METABOL) TLB-CBC Platelet - w/Differential (85025-CBCD) TLB-Hepatic/Liver Function Pnl (80076-HEPATIC) TLB-TSH (Thyroid Stimulating Hormone) (84443-TSH) TLB-PT (Protime) (85610-PTP) TLB-PTT (85730-PTTL) UA Dipstick w/o Micro (manual) (60454)  Problem # 2:  ECCHYMOSES (ICD-782.9)  Complete Medication List: 1)  Diltiazem Hcl Cr 240 Mg Cp24 (Diltiazem hcl) .[name].. 1 by mouth once daily 2)  Omeprazole 20 Mg Cpdr (Omeprazole) .... Two times a day 3)  Naprosyn 500 Mg Tabs (Naproxen) .... Two times a day 4)  Ketorolac Tromethamine 10 Mg Tabs (Ketorolac tromethamine) .... Three times a day as needed severe pain 5)  Furosemide 20 Mg Tabs (Furosemide) .... Once daily 6)  Lisinopril 20 Mg Tabs (Lisinopril) .Marland KitchenMarland KitchenMarland Kitchen  2 tablet by mouth daily 7)  Zoloft 50 Mg Tabs (Sertraline hcl) .... Once daily 8)  Aspirin 81 Mg Tbec  (Aspirin) .... Take one tablet daily 9)  Metronidazole 500 Mg Tabs (Metronidazole) .... Two times a day  Patient Instructions: 1)  This is consistent with a Clostridium difficile infection, given her recent rounds of antibiotics. Treat with Flagyl. Check labs including her clotting functions.  Prescriptions: OMEPRAZOLE 20 MG CPDR (OMEPRAZOLE) two times a day  #60 x 11   Entered and Authorized by:   Nelwyn Salisbury MD   Signed by:   Nelwyn Salisbury MD on 04/24/2009   Method used:   [facility]ronically to        [facility]  [address] [address] [phone]* (retail)       [address]       [place], [place]  [address]       Ph: [phone] or [phone]       Fax: [phone]   RxID:   [id number] ZOLOFT 50 MG TABS (SERTRALINE HCL) once daily  #30 x 11   Entered and Authorized by:   Nelwyn Salisbury MD   Signed by:   Nelwyn Salisbury MD on 04/24/2009   Method used:   [facility]        [facility]  [address] [phone]* (retail)       [address]       [place], [place]  [address]       Ph: [phone] or [phone]       Fax: [phone]   RxID:   [id number] LISINOPRIL 20 MG TABS (LISINOPRIL) 2 tablet by mouth daily  #60 x 11   Entered and Authorized by:   Nelwyn Salisbury MD   Signed by:   Nelwyn Salisbury MD on 04/24/2009   Method used:   [facility]ically [facility]        [facility]  [address] [phone]* (retail)       [address]       [place], [place]  [address]       Ph: [phone] or [phone]       Fax: [phone]   RxID:   [id number] NAPROSYN 500 MG  TABS (NAPROXEN) two times a day  #60 x 11   Entered and Authorized by:   Nelwyn Salisbury MD   Signed by:   Nelwyn Salisbury MD on 04/24/2009   Method used:   [facility]ronically [facility]        [facility]  [address] [phone]* (retail)       [address]       [place], [place]  [address]       Ph: [phone] or [phone]       Fax: [phone]   RxID:   [id number] DILTIAZEM HCL CR 240 MG  CP24 (DILTIAZEM HCL) 1 by  mouth once daily  #30 x 11   Entered and Authorized by:   Nelwyn Salisbury MD   Signed by:   Nelwyn Salisbury MD on 04/24/2009   Method used:   [facility]ically [facility]        [facility]  [address] [phone]* (retail)       [address] [facility]  [place], [place]  [address]       Ph: [phone] or [phone]       Fax: [phone]   RxID:   [id number] METRONIDAZOLE 500 MG TABS (METRONIDAZOLE) two times a day  #28 x 0   Entered and Authorized by:   Nelwyn Salisbury MD   Signed by:   Nelwyn Salisbury MD on 04/24/2009   Method used:   [facility]        [facility]  [address] [phone]* (retail)       [address]       [place], [place]  [address]       Ph: [phone] or [phone]       Fax: [phone]   RxID:   [id number]   Laboratory Results   Urine Tests    Routine Urinalysis   Color: yellow Appearance: Clear Glucose: negative   (Normal Range: Negative) Bilirubin: negative   (Normal Range: Negative) Ketone: negative   (Normal Range: Negative) Spec. Gravity: 1.025   (Normal Range: 1.003-1.035) Blood: negative   (Normal Range: Negative) pH: 5.0   (Normal Range: 5.0-8.0) Protein: 30   (Normal Range: Negative) Urobilinogen: 0.2   (Normal Range: 0-1) Nitrite: negative   (Normal Range: Negative) Leukocyte Esterace: negative   (Normal Range: Negative)

## 2010-02-26 NOTE — Assessment & Plan Note (Signed)
Summary: SHOULDER PAIN/NJR   Vital Signs:  Patient profile:   45 year old female Weight:      223 pounds O2 Sat:      92 % Temp:     97.9 degrees F Pulse rate:   82 / minute BP sitting:   136 / 88  (left arm) Cuff size:   large  Vitals Entered By: Pura Spice, RN (November 28, 2009 11:12 AM) CC: left shoulder pain  went to urgent care on 11/23/2009 was started on prednisone and meloxicam  15mg     History of Present Illness: Here for one week of severe pain from the left neck to the left shoulder and down the left arm. No recent trauma. She has known degenerative disc disease throughout the entire spine. She saw Urgent Care 3 days ago and was given Meloxicam and a steroid taper. This has not helped at all.   Allergies: 1)  ! Doxycycline 2)  ! Tylox 3)  ! Codeine 4)  ! * Albuterol 5)  ! Lisinopril 6)  ! Flagyl 7)  ! * Zofran 8)  ! Vancomycin  Past History:  Past Medical History: Reviewed history from 06/07/2009 and no changes required. Allergies Asthma GERD Hypertension Meningitis Colonic polyps, hx of Low back pain interstitial cystitis, per Dr. Isabel Caprice Diabetes mellitus, type II angioedema eczema  Past Surgical History: Reviewed history from 05/10/2009 and no changes required. Hysterectomy BTL Brain Tumor, benign 1984 SVD x2 Tonsillectomy colonoscopy 05-08-08  per Dr. Matthias Hughs, benign polyps, repeat 5 yrs EGD 9-04 per Dr. Matthias Hughs, GERD only removal of a cystic mass from her abdomen 04-30-09 per Dr. Hardin Negus at [facility]  Review of Systems  The patient denies anorexia, fever, weight loss, weight gain, vision loss, decreased hearing, hoarseness, chest pain, syncope, dyspnea on exertion, peripheral edema, prolonged cough, headaches, hemoptysis, abdominal pain, melena, hematochezia, severe indigestion/heartburn, hematuria, incontinence, genital sores, muscle weakness, suspicious skin lesions, transient blindness, difficulty walking, depression, unusual  weight change, abnormal bleeding, enlarged lymph nodes, angioedema, breast masses, and testicular masses.    Physical Exam  General:  walks with a cane, no apparent distress Msk:  very tender with spasm and reduced ROM of the neck, especially on the left side. The left trapezius is tender. The left shoulder itself is benign with full ROM.  Pulses:  R and L carotid,radial,femoral,dorsalis pedis and posterior tibial pulses are full and equal bilaterally Extremities:  No clubbing, cyanosis, edema, or deformity noted with normal full range of motion of all joints.     Impression & Recommendations:  Problem # 1:  BRACHIAL NEURITIS OR RADICULITIS NOS (ICD-723.4)  Problem # 2:  NECK PAIN (ICD-723.1)  The following medications were removed from the medication list:    Meloxicam 15 Mg Tabs (Meloxicam) Her updated medication list for this problem includes:    Tramadol Hcl 50 Mg Tabs (Tramadol hcl) .[name]... 1 q 6 hours as needed for pain    Aspir-low 81 Mg Tbec (Aspirin) .[name]... 1 once daily    Flexeril 10 Mg Tabs (Cyclobenzaprine hcl) .[name]... Three times a day as needed spasm    Dilaudid 4 Mg Tabs (Hydromorphone hcl) .[name]... 1 q 6 hours as needed severe pain  Complete Medication List: 1)  Promethazine Hcl 25 Mg Tabs (Promethazine hcl) .... Take 1/2 every six hours as needed nausea and vomiting 2)  Magic Mouthwash  .... Swish ans spit 15ml by mouth every six hours as needed 3)  Benadryl 25 Mg Tabs (Diphenhydramine  hcl) .... As needed 4)  Sertraline Hcl 50 Mg Tabs (Sertraline hcl) .[name].. 1 once daily 5)  Tramadol Hcl 50 Mg Tabs (Tramadol hcl) .[name].. 1 q 6 hours as needed for pain 6)  Aspir-low 81 Mg Tbec (Aspirin) .[name].. 1 once daily 7)  Diltiazem Hcl Er Beads 240 Mg Xr24h-cap (Diltiazem hcl er beads) .[name].. 1 once daily 8)  Omeprazole 20 Mg Cpdr (Omeprazole) .[name].. 1 once daily 9)  [facility] Ultra Thin Lancets Misc (Lancets) .... Uad 10)  Truetest Test Strp (Glucose blood) .... Uad 11)  Hydrochlorothiazide 25 Mg Tabs  (Hydrochlorothiazide) .... Once daily 12)  Triamcinolone Acetonide 0.1 % Crea (Triamcinolone acetonide) .... Apply two times a day as needed 13)  Permethrin 5 % Crea (Permethrin) .... Apply as directed 14)  Neurontin 300 Mg Caps (Gabapentin) .... Two times a day 15)  Fenofibrate 160 Mg Tabs (Fenofibrate) .... Once daily 16)  Cyanocobalamin 1000 Mcg/ml Soln (Cyanocobalamin) .... Give one ml per week 17)  Monoject Syringe 25g X 1-1/4" 3 Ml Misc (Syringe/needle (disp)) .... As directed 18)  Flexeril 10 Mg Tabs (Cyclobenzaprine hcl) .... Three times a day as needed spasm 19)  Dilaudid 4 Mg Tabs (Hydromorphone hcl) .[name].. 1 q 6 hours as needed severe pain  Patient Instructions: 1)  Her arm pain is from pinched nerves in the neck. Use moist heat. Finish out the steroid taper but stop the Meloxicam. Refilled Tramadol. Use Flexeril for spasm and Dilaudid for severe pains. recheck if not better in one week. Prescriptions: DILAUDID 4 MG TABS (HYDROMORPHONE HCL) 1 q 6 hours as needed severe pain  #60 x 0   Entered and Authorized by:   Nelwyn Salisbury MD   Signed by:   Nelwyn Salisbury MD on 11/28/2009   Method used:   Print then Give to Patient   RxID:   [id number] TRAMADOL HCL 50 MG TABS (TRAMADOL HCL) 1 q 6 hours as needed for pain  #60 x 5   Entered and Authorized by:   Nelwyn Salisbury MD   Signed by:   Nelwyn Salisbury MD on 11/28/2009   Method used:   Print then Give to Patient   RxID:   [id number] FLEXERIL 10 MG TABS (CYCLOBENZAPRINE HCL) three times a day as needed spasm  #60 x 5   Entered and Authorized by:   Nelwyn Salisbury MD   Signed by:   Nelwyn Salisbury MD on 11/28/2009   Method used:   Print then Give to Patient   RxID:   [id number]    Orders Added: 1)  Est. Patient Level IV [20254]

## 2010-02-26 NOTE — Miscellaneous (Signed)
  Clinical Lists Changes  Medications: Added new medication of [facility] ULTRA THIN LANCETS  MISC (LANCETS) UAD Added new medication of TRUETEST TEST  STRP (GLUCOSE BLOOD) UAD

## 2010-02-26 NOTE — Assessment & Plan Note (Signed)
Summary: ALLERGIC REACTION? (HIVES) // RS   Vital Signs:  Patient profile:   45 year old female O2 Sat:      82 % on Room air Temp:     98.1 degrees F axillary Pulse rate:   80 / minute Pulse rhythm:   regular BP sitting:   120 / 88  (left arm)  Vitals Entered By: Gladis Riffle, RN (May 02, 2009 9:25 AM)  O2 Flow:  Room air CC: allergic reaction--was discharged from [facility] last night for surgery on abdominal cyst--went to hospital for facial swelling that was told is from lisinopril Is Patient Diabetic? Yes Did you bring your meter with you today? No CBG Result 167 Comments wheezing--has saved urine in case was needed, no sxs at present--continues nausea and body hives   History of Present Illness: Here with her husband for rapidly spreading hives over her entire body including the face over the past 6 hours. She also has been very SOB and wheezing in that time. She was admitted to [facility] on 04-29-09 for the sudden onset of angioedema causing swelling of her face and lips. This was brought under control fairly quickly with IV steroids, but she also told them about the abdominal pains she has been having for the past 3 weeks. They got an abdominal CT scan which showed a mass. She then went to laparoscopic surgery on 04-30-09 per Dr. Hardin Negus, who removed a "cystic mass" from her intestines. Reportedly this was benign, but Petrina does not really know what the nature of this mass was. I  have received no information from [facility] to this point. It was felt that the most likely cause of her angioedema was her Lisinopril, so this was stopped. In fact all her meds were stopped, and substitute meds were started. Her glucoses also jumoed up to the 200s and 300s while she was there, so she has now been diagnosed with diabetes. She was started on Glipizide for this. She was DC'd to home last night, however over the night she has developed the itching hives over her body as  above. She is wheezing and very SOB.   Current Medications (verified): 1)  Naprosyn 500 Mg Tabs (Naproxen) .... Take 1 Tablet By Mouth Two Times A Day 2)  Citalopram Hydrobromide 20 Mg Tabs (Citalopram Hydrobromide) .... One Tablet Every Morning 3)  Amlodipine Besylate 5 Mg Tabs (Amlodipine Besylate) .... One Tablet Every Morning 4)  Glipizide Xl 5 Mg Xr24h-Tab (Glipizide) .... Take 1 Tablet By Mouth Once A Day For Sugar 5)  Promethazine Hcl 25 Mg Tabs (Promethazine Hcl) .... Take 1/2 Every Six Hours As Needed Nausea and Vomiting 6)  Magic Mouthwash .... Swish Ans Spit 15ml By Mouth Every Six Hours As Needed  Allergies: 1)  ! Doxycycline 2)  ! * Antibiotics 3)  ! Tylox 4)  ! Codeine 5)  ! * Albuterol 6)  ! Lisinopril  Past History:  Past Medical History: Allergies Asthma GERD Hypertension Meningitis Colonic polyps, hx of Low back pain interstitial cystitis, per Dr. Isabel Caprice Diabetes mellitus, type II angioedema  Past Surgical History: Hysterectomy BTL Brain Tumor, benign 1984 SVD x2 Tonsillectomy colonoscopy 05-08-08  per Dr. Matthias Hughs, benign polyps, repeat 5 yrs EGD 9-04 per Dr. Matthias Hughs, GERD only removal of a cystic mass from her abdomen 04-30-09 per Dr. Hardin Negus at [facility]  Review of Systems  The patient denies anorexia, fever, weight loss, weight gain, vision loss, decreased hearing, hoarseness, chest  pain, syncope, peripheral edema, prolonged cough, headaches, hemoptysis, melena, hematochezia, severe indigestion/heartburn, hematuria, incontinence, genital sores, muscle weakness, suspicious skin lesions, transient blindness, difficulty walking, depression, unusual weight change, abnormal bleeding, enlarged lymph nodes, angioedema, breast masses, and testicular masses.    Physical Exam  General:  alert but weak and audibly wheezing and SOB, in a wheelchair Head:  mild facial edema Neck:  No deformities, masses, or tenderness noted. Lungs:  very reduced  airflow diffusely, soft wheezes, no rales Heart:  Normal rate and regular rhythm. S1 and S2 normal without gallop, murmur, click, rub or other extra sounds. Skin:  diffuse urticaria all over the body   Impression & Recommendations:  Problem # 1:  ALLERGIC URTICARIA (ICD-708.0)  Problem # 2:  ANGIOEDEMA (ICD-995.1)  Problem # 3:  DIABETES MELLITUS, TYPE II (ICD-250.00)  The following medications were removed from the medication list:    Lisinopril 20 Mg Tabs (Lisinopril) .[name]... 2 tablet by mouth daily    Aspirin 81 Mg Tbec (Aspirin) .[name]... Take one tablet daily Her updated medication list for this problem includes:    Glipizide Xl 5 Mg Xr24h-tab (Glipizide) .[name]... Take 1 tablet by mouth once a day for sugar  Orders: Capillary Blood Glucose/CBG (16109)  Problem # 4:  ASTHMA (ICD-493.90)  Her updated medication list for this problem includes:    Methylprednisolone (pak) 4 Mg Tabs (Methylprednisolone) ..... Uad  Orders: Albuterol Sulfate Sol 1mg  unit dose (U0454) Ipratropium inhalation sol. unit dose (U9811) Nebulizer Tx (91478)  Complete Medication List: 1)  Naprosyn 500 Mg Tabs (Naproxen) .... Take 1 tablet by mouth two times a day 2)  Citalopram Hydrobromide 20 Mg Tabs (Citalopram hydrobromide) .... One tablet every morning 3)  Amlodipine Besylate 5 Mg Tabs (Amlodipine besylate) .... One tablet every morning 4)  Glipizide Xl 5 Mg Xr24h-tab (Glipizide) .... Take 1 tablet by mouth once a day for sugar 5)  Promethazine Hcl 25 Mg Tabs (Promethazine hcl) .... Take 1/2 every six hours as needed nausea and vomiting 6)  Magic Mouthwash  .... Swish ans spit 15ml by mouth every six hours as needed 7)  Methylprednisolone (pak) 4 Mg Tabs (Methylprednisolone) .... Uad 8)  Benadryl 25 Mg Tabs (Diphenhydramine hcl) .... As needed 9)  Cortizone-10/aloe 1 % Crea (Hydrocortisone-aloe vera) .... As needed  Patient Instructions: 1)  After we gave her a nebulizer treatment, her O2 sat went up to  93% but she she still looks bad, she is still very weak and SOB, and she is still moving very little air through her lungs. She needs to be on IV steroids and receive serial nebulizations. Her husband will transport her to [facility] ER now for further management, and I think it likely that she will need to be re-admitted for a time.    Medication Administration  Medication # 1:    Medication: Albuterol Sulfate Sol 1mg  unit dose    Diagnosis: ASTHMA (ICD-493.90)    Dose: 3 ml    Route: inhaled    Exp Date: 06/28/2010    Lot #: [id number]    Mfr: nephron    Patient tolerated medication without complications    Given by: Gladis Riffle, RN (May 02, 2009 10:55 AM)  Medication # 2:    Medication: Ipratropium inhalation sol. unit dose    Diagnosis: ASTHMA (ICD-493.90)    Dose: 2.26ml    Route: inhaled    Exp Date: 05/28/2010    Lot #: [id number]    Mfr: nephron  Patient tolerated medication without complications    Given by: Gladis Riffle, RN (May 02, 2009 10:56 AM)  Orders Added: 1)  Albuterol Sulfate Sol 1mg  unit dose [U9811] 2)  Ipratropium inhalation sol. unit dose [J7644] 3)  Nebulizer Tx [94640] 4)  Capillary Blood Glucose/CBG [82948] 5)  Est. Patient Level V [91478]

## 2010-03-29 ENCOUNTER — Ambulatory Visit (INDEPENDENT_AMBULATORY_CARE_PROVIDER_SITE_OTHER): Payer: BC Managed Care – PPO | Admitting: Family Medicine

## 2010-03-29 ENCOUNTER — Encounter: Payer: Self-pay | Admitting: Family Medicine

## 2010-03-29 DIAGNOSIS — I10 Essential (primary) hypertension: Secondary | ICD-10-CM

## 2010-03-29 DIAGNOSIS — E119 Type 2 diabetes mellitus without complications: Secondary | ICD-10-CM

## 2010-03-29 DIAGNOSIS — E538 Deficiency of other specified B group vitamins: Secondary | ICD-10-CM

## 2010-03-29 DIAGNOSIS — L719 Rosacea, unspecified: Secondary | ICD-10-CM

## 2010-03-29 MED ORDER — METRONIDAZOLE 0.75 % EX GEL: Freq: Two times a day (BID) | CUTANEOUS | Status: AC

## 2010-03-29 NOTE — Progress Notes (Signed)
  Subjective:    Patient ID: Christina [name], female    DOB: 14-Jun-1965, 45 y.o.   MRN: [id number]  HPI Here to follow up on low B12 and diabetes. Also for several months she has had a red, scaly, itchy rash over the face.   Review of Systems  Constitutional: Negative.   Respiratory: Negative.   Cardiovascular: Negative.   Skin: Positive for rash.       Objective:   Physical Exam  Constitutional: She appears well-developed and well-nourished.  Neck: No thyromegaly present.  Cardiovascular: Normal rate, regular rhythm, normal heart sounds and intact distal pulses.   Pulmonary/Chest: Effort normal and breath sounds normal.  Lymphadenopathy:    She has no cervical adenopathy.  Skin:       The entire face and upper neck has a red,  Macular, scaly rash          Assessment & Plan:  Check labs today. Try Metrogel.

## 2010-04-03 ENCOUNTER — Telehealth: Payer: Self-pay

## 2010-04-03 NOTE — Telephone Encounter (Signed)
Message copied by Kyung Rudd on Wed Apr 03, 2010  2:20 PM ------      Message from: Dwaine Deter      Created: Mon Apr 01, 2010  8:23 AM       B12 is normal, her diabetes is slightly out of control. Stay on current meds and watch the diet

## 2010-04-03 NOTE — Progress Notes (Signed)
Pt aware.

## 2010-04-03 NOTE — Telephone Encounter (Signed)
Pt aware.

## 2010-04-17 LAB — DIFFERENTIAL
Basophils Absolute: 0 10*3/uL (ref 0.0–0.1)
Basophils Relative: 0 % (ref 0–1)
Eosinophils Relative: 0 % (ref 0–5)
Lymphocytes Relative: 14 % (ref 12–46)
Monocytes Absolute: 0.2 10*3/uL (ref 0.1–1.0)
Monocytes Relative: 2 % — ABNORMAL LOW (ref 3–12)
Neutro Abs: 10.2 10*3/uL — ABNORMAL HIGH (ref 1.7–7.7)

## 2010-04-17 LAB — CBC
HCT: 35.7 % — ABNORMAL LOW (ref 36.0–46.0)
HCT: 43.3 % (ref 36.0–46.0)
Hemoglobin: 14.6 g/dL (ref 12.0–15.0)
MCHC: 33.8 g/dL (ref 30.0–36.0)
Platelets: 195 10*3/uL (ref 150–400)
RBC: 4.85 MIL/uL (ref 3.87–5.11)
RDW: 14 % (ref 11.5–15.5)
RDW: 14.7 % (ref 11.5–15.5)

## 2010-04-17 LAB — CULTURE, BLOOD (ROUTINE X 2): Culture: NO GROWTH

## 2010-04-17 LAB — URINALYSIS, ROUTINE W REFLEX MICROSCOPIC
Bilirubin Urine: NEGATIVE
Hgb urine dipstick: NEGATIVE
Ketones, ur: NEGATIVE mg/dL
Specific Gravity, Urine: 1.008 (ref 1.005–1.030)
pH: 6.5 (ref 5.0–8.0)

## 2010-04-17 LAB — MAGNESIUM: Magnesium: 1.9 mg/dL (ref 1.5–2.5)

## 2010-04-17 LAB — BASIC METABOLIC PANEL
BUN: 12 mg/dL (ref 6–23)
CO2: 29 mEq/L (ref 19–32)
Calcium: 8.1 mg/dL — ABNORMAL LOW (ref 8.4–10.5)
Calcium: 8.3 mg/dL — ABNORMAL LOW (ref 8.4–10.5)
Creatinine, Ser: 0.89 mg/dL (ref 0.4–1.2)
GFR calc Af Amer: >60 mL/min (ref >60)
GFR calc non Af Amer: >60 mL/min (ref >60)
Glucose, Bld: 145 mg/dL — ABNORMAL HIGH (ref 70–99)
Glucose, Bld: 257 mg/dL — ABNORMAL HIGH (ref 70–99)
Potassium: 3.4 mEq/L — ABNORMAL LOW (ref 3.5–5.1)
Sodium: 135 mEq/L (ref 135–145)

## 2010-04-17 LAB — C3 COMPLEMENT: C3 Complement: 86 mg/dL — ABNORMAL LOW (ref 88–201)

## 2010-05-08 ENCOUNTER — Other Ambulatory Visit: Payer: Self-pay | Admitting: Family Medicine

## 2010-05-11 ENCOUNTER — Other Ambulatory Visit: Payer: Self-pay | Admitting: Family Medicine

## 2010-05-16 ENCOUNTER — Other Ambulatory Visit: Payer: Self-pay | Admitting: Family Medicine

## 2010-05-24 ENCOUNTER — Other Ambulatory Visit: Payer: Self-pay | Admitting: *Deleted

## 2010-05-24 DIAGNOSIS — I10 Essential (primary) hypertension: Secondary | ICD-10-CM

## 2010-05-24 MED ORDER — HYDROCHLOROTHIAZIDE 25 MG PO TABS: 25.0000 mg | ORAL_TABLET | Freq: Every day | ORAL | Status: DC

## 2010-06-07 ENCOUNTER — Other Ambulatory Visit: Payer: Self-pay

## 2010-06-07 MED ORDER — GLUCOSE BLOOD VI STRP: ORAL_STRIP | Status: AC

## 2010-06-07 NOTE — Telephone Encounter (Signed)
[facility] [address] truetest glucose test x5

## 2010-06-11 NOTE — Assessment & Plan Note (Signed)
[facility]                                 ON-CALL NOTE   NAME:[name], Christina                          MRN:          [id number]  DATE:06/04/2007                            DOB:          Dec 23, 1965    TIME OF INTERACTION:  10:48 p.m.   PHONE NUMBER:  [phone]   CALLER:  Reginia Naas, the daughter.   The patient is in severe pain in her left leg; it started this afternoon  about 3 or 4 o'clock, although she says it really started yesterday.  She has pain from the pelvis down to the left ankle.  She has known  degenerative disk disease and is used to being in pain, but this is  different.  This goes all the way from her hip down her left leg and  precludes her from walking.  She fell years ago, not recently, has a  fever.  This discomfort again is unusual for her.  She just does not  feel good.  She is overweight and cannot get out the door or down the  steps by herself or with her daughter.  Her husband is indeed on his way  home from work.  I told them she can wait until he gets home and then go  to the emergency room to be seen.  She lives in [place] and will  go to the [facility] when he gets home.  I tried to impress upon  her with a fever that it is important that she be seen and she said she  would go to the hospital to be evaluated.   PRIMARY CARE Labrisha Wuellner:  Dr. Clent Ridges and home office is [place].     Arta Silence, MD  Electronically Signed    RNS/MedQ  DD: 06/04/2007  DT: 06/04/2007  Job #: [id number]

## 2010-06-14 NOTE — Assessment & Plan Note (Signed)
[facility]                            [place] OFFICE NOTE   NAME:Narasimhan, Christina [name]                        MRN:          [id number]  DATE:01/14/2006                            DOB:          October 01, 1965    This is a 45 year old woman who here a non-gynecological physical  examination. She is accompanied by her husband as usual. Most of our  time spent today was discussing how she has felt bad chronically for  the past several years. She has gained a tremendous amount of weight  over the past several years. She has no energy. She feels tired all the  time. She feels sluggish and somewhat depressed, although she certainly  denies any suicidal ideations. She does not get particularly tearful,  but does admit to not getting enjoyment from things that she used to  enjoy. She has trouble sleeping at times. She apparently does not eat a  lot of food, but is very frustrated with her inability to lose weight.  She admits to getting almost no regular exercise, however, she does have  chronic low back pain which limits her exercise. She denies any proximal  muscle weakness, however, no other specific neurologic phenomena. I last  saw her on December 4th, when she had a small abscessed cyst on the  right breast. She took a 10 day course of Keflex which I prescribed and  it has greatly improved. It is almost gone at this point. She and her  husband have been doing some research on the Internet, and she wonders  if she could have Cushing's disease, and in fact, she does have a lot of  the symptoms and stigmata of it, although she does lack certain typical  symptoms associated with it. She would like to be tested for it. Of  note, she did have fasting laboratories here on December 4th, these were  all completely within normal limits, and especially I was glad that she  showed no signs of diabetes. For other details for past medical history,  family history, social  history, habits, etc., refer to her last physical  note dated January 01, 2005. Her asthma has been under fairly good  control lately.   ALLERGIES:  DOXYCYCLINE  TYLOX  CODEINE   CURRENT MEDICATIONS:  1. Diltiazem 240 mg daily.  2. Omeprazole 20 mg b.i.d.   OBJECTIVE:  VITAL SIGNS: Height 5 foot 3 inches, weight 217, blood  pressure 138/88, pulse 84 and regular.  GENERAL: She remains morbidly obese, affect is somewhat flat, but she  has a good eye contact and is very appropriate.  SKIN: Clear, with the exception of a small erythematous tender spot on  the right breast, this is much improved over when I saw it several weeks  ago.  HEENT: Eyes clear, ears clear, oral pharynx clear.  NECK: Supple without lymphadenopathy or masses.  LUNGS: Clear with good airflow.  CARDIAC: Regular rate and rhythm, no gallops, rubs or murmurs. Pulse is  full.  ABDOMEN: Soft, normal bowel sounds, nontender, no masses.  EXTREMITIES: Show no cyanosis, clubbing, or edema.  NEUROLOGIC: Grossly intact.  Of note, most of her obesity is concentrated in the truncal areas, with  fairly thin arms and legs.   ASSESSMENT AND PLAN:  1. Complete physical exam. We talked about the necessity of increasing      exercise with an eye to losing weight.  2. Chronic low back pain, stable.  3. Asthma, stable.  4. Gastroesophageal reflux disease, stable.  5. Depression. We spent some time talking about somatic symptoms of      depression, and she agreed to give medication a try. I gave her      samples for Cymbalta 60 mg, to take once a day. I asked her and her      husband to return in 3 weeks for a followup visit.  6. Possibility of Cushing's disease. We will set her up for a 24 hour      urine, for a urine free cortisol evaluation.     Tera Mater. Clent Ridges, MD  Electronically Signed    SAF/MedQ  DD: 01/14/2006  DT: 01/15/2006  Job #: [id number]

## 2010-06-14 NOTE — Op Note (Signed)
[name], [name]                 ACCOUNT NO.:  [id number]   MEDICAL RECORD NO.:  [id number]          PATIENT TYPE:  AMB   LOCATION:  [facility]                         FACILITY:  [facility]   PHYSICIAN:  Valetta Fuller, M.D.  DATE OF BIRTH:  11/09/1965   DATE OF PROCEDURE:  DATE OF DISCHARGE:                                 OPERATIVE REPORT   PREOPERATIVE DIAGNOSES:  1.  Chronic pelvic pain.  2.  Frequency.  3.  Possible interstitial cystitis.   POSTOPERATIVE DIAGNOSES:  1.  Chronic pelvic pain.  2.  Frequency.  3.  Possible interstitial cystitis.   PROCEDURE PERFORMED:  Cystoscopy with hydraulic over-distention of the  bladder, urethral dilation and instillation of Clorpactin, Marcaine, and  Pyridium.   SURGEON:  Valetta Fuller, M.D.   ANESTHESIA:  General.   INDICATIONS:  [name] is a 45 year old female.  She does not have much in  the way of urologic history.  She has had longstanding suprapubic and pelvic  pain.  She has also had frequent urination for the last 8-10 years.  She  complains of nocturia 2-3 times per night but during the day can go as often  as every 15-30 minutes.  The patient has not had evidence of infection.  She  has been tried on nonspecific antispasmodic medication for her bladder  without significant improvement.  Because of this ongoing problem with  pelvic pain and frequent urination, we thought she might have interstitial  cystitis, and she presents now for diagnostic as well as potential  therapeutic trial of cystoscopy with hydraulic over-distention of her  bladder and instillation of medication, as indicated.   TECHNIQUE/FINDINGS:  Patient was brought to the operating room, where she  had successful  induction of general anesthesia.  She was placed in a  lithotomy position and was prepped and draped in the usual manner.  The  urethra was dilated to 45 [place] and found not to have any significant  stenosis.  On cystoscopy, her bladder appeared  endoscopically normal.  She  underwent hydraulic distention for five minutes at 100 cm of water pressure.  Capacity was measured at 750 cc.  The patient did have a few scattered 1-2+  glomerular areas of hemorrhage.  She did not appear endoscopically to have  severe interstitial cystitis, but a milder form could not be ruled out.  At  the completion of the procedure, her bladder was drained.  A red Roxan Hockey  was placed, and she had instillation of  standard Clorpactin under gravity drainage a few hundred cc's at a time.  At  the completion of this, her bladder was drained and again rinsed.  We then  placed some Pyridium and Marcaine in her bladder.  She was brought to the  recovery room in stable condition in stable condition.      DSG/MEDQ  D:  11/24/2003  T:  11/24/2003  Job:  [id number]

## 2010-06-25 ENCOUNTER — Other Ambulatory Visit: Payer: Self-pay | Admitting: Family Medicine

## 2010-06-25 NOTE — Telephone Encounter (Signed)
Pt needs refill on tramadol 50 mg and generic flexeril  [facility] [place] [phone]

## 2010-06-26 MED ORDER — CYCLOBENZAPRINE HCL 10 MG PO TABS: 10.0000 mg | ORAL_TABLET | Freq: Three times a day (TID) | ORAL | Status: DC | PRN

## 2010-06-26 MED ORDER — TRAMADOL HCL 50 MG PO TABS: 50.0000 mg | ORAL_TABLET | ORAL | Status: DC | PRN

## 2010-06-26 NOTE — Telephone Encounter (Signed)
Call in Tramadol 50 mg q 4 hours prn pain, #120 with 5 rf and Flexeril 10 mg q 8 hours prn spasm, #90 with 5 rf

## 2010-06-26 NOTE — Telephone Encounter (Signed)
Rx[s] phoned in Done.

## 2010-08-03 ENCOUNTER — Other Ambulatory Visit: Payer: Self-pay | Admitting: Family Medicine

## 2010-08-12 ENCOUNTER — Other Ambulatory Visit: Payer: Self-pay | Admitting: Family Medicine

## 2010-08-12 NOTE — Telephone Encounter (Signed)
Pharmacy request Zoloft; last filled on 05/11/10 and pt last seen 03/29/10

## 2010-08-14 NOTE — Telephone Encounter (Signed)
Call in a one year supply  

## 2010-08-15 ENCOUNTER — Other Ambulatory Visit: Payer: Self-pay | Admitting: Family Medicine

## 2010-08-30 ENCOUNTER — Encounter: Payer: Self-pay | Admitting: Family Medicine

## 2010-08-30 ENCOUNTER — Ambulatory Visit (INDEPENDENT_AMBULATORY_CARE_PROVIDER_SITE_OTHER): Payer: BC Managed Care – PPO | Admitting: Family Medicine

## 2010-08-30 VITALS — BP 128/80 | HR 88 | Temp 98.1°F | Wt 220.0 lb

## 2010-08-30 DIAGNOSIS — R21 Rash and other nonspecific skin eruption: Secondary | ICD-10-CM

## 2010-08-30 MED ORDER — MUPIROCIN CALCIUM 2 % EX CREA: TOPICAL_CREAM | CUTANEOUS | Status: AC

## 2010-08-30 MED ORDER — SULFAMETHOXAZOLE-TRIMETHOPRIM 800-160 MG PO TABS: 1.0000 | ORAL_TABLET | Freq: Two times a day (BID) | ORAL | Status: AC

## 2010-08-30 MED ORDER — SERTRALINE HCL 100 MG PO TABS: 100.0000 mg | ORAL_TABLET | Freq: Every day | ORAL | Status: DC

## 2010-08-30 NOTE — Progress Notes (Signed)
  Subjective:    Patient ID: Christina [name], female    DOB: 07/29/1965, 45 y.o.   MRN: [id number]  HPI Here for one week of scattered tender sore spots or boils on the arms and face. The largest of these is on the face, and it is painful. Neosporin does not help. No fever. She has been treated for MRSA in the past.    Review of Systems  Constitutional: Negative.   Skin: Positive for rash.       Objective:   Physical Exam  Constitutional: She appears well-developed and well-nourished.  Lymphadenopathy:    She has no cervical adenopathy.  Skin:       Scattered single red macular scabbed areas as above, the largest is about 1 cm in diameter on the chin.         Assessment & Plan:  This is a staph infection, possibly MRSA. We will treat it as such with intranasal Bactroban and Bactrim DS. Recheck in 2 weeks

## 2010-09-12 ENCOUNTER — Other Ambulatory Visit: Payer: Self-pay | Admitting: Family Medicine

## 2010-09-27 ENCOUNTER — Encounter: Payer: Self-pay | Admitting: Family Medicine

## 2010-09-27 ENCOUNTER — Ambulatory Visit (INDEPENDENT_AMBULATORY_CARE_PROVIDER_SITE_OTHER): Payer: BC Managed Care – PPO | Admitting: Family Medicine

## 2010-09-27 VITALS — BP 122/80 | HR 99 | Temp 98.0°F | Wt 216.0 lb

## 2010-09-27 DIAGNOSIS — E119 Type 2 diabetes mellitus without complications: Secondary | ICD-10-CM

## 2010-09-27 DIAGNOSIS — R21 Rash and other nonspecific skin eruption: Secondary | ICD-10-CM

## 2010-09-27 LAB — CBC WITH DIFFERENTIAL/PLATELET
Basophils Absolute: 0 10*3/uL (ref 0.0–0.1)
Hemoglobin: 13.7 g/dL (ref 12.0–15.0)
Lymphocytes Relative: 22.1 % (ref 12.0–46.0)
Monocytes Relative: 4.2 % (ref 3.0–12.0)
Platelets: 205 10*3/uL (ref 150.0–400.0)
RDW: 13.8 % (ref 11.5–14.6)

## 2010-09-27 LAB — HEPATIC FUNCTION PANEL
ALT: 24 U/L (ref 0–35)
AST: 20 U/L (ref 0–37)
Albumin: 3.9 g/dL (ref 3.5–5.2)

## 2010-09-27 LAB — BASIC METABOLIC PANEL
BUN: 18 mg/dL (ref 6–23)
GFR: 97.32 mL/min (ref >60.00)
Glucose, Bld: 177 mg/dL — ABNORMAL HIGH (ref 70–99)
Potassium: 3.3 mEq/L — ABNORMAL LOW (ref 3.5–5.1)

## 2010-09-27 LAB — TSH: TSH: 2.96 u[IU]/mL (ref 0.35–5.50)

## 2010-09-27 LAB — HEMOGLOBIN A1C: Hgb A1c MFr Bld: 7.8 % — ABNORMAL HIGH (ref 4.6–6.5)

## 2010-09-27 MED ORDER — SULFAMETHOXAZOLE-TRIMETHOPRIM 800-160 MG PO TABS: 1.0000 | ORAL_TABLET | Freq: Two times a day (BID) | ORAL | Status: DC

## 2010-09-27 MED ORDER — TRAMADOL HCL 50 MG PO TABS: 100.0000 mg | ORAL_TABLET | Freq: Two times a day (BID) | ORAL | Status: DC

## 2010-09-27 MED ORDER — CYCLOBENZAPRINE HCL 10 MG PO TABS: 10.0000 mg | ORAL_TABLET | Freq: Three times a day (TID) | ORAL | Status: DC | PRN

## 2010-09-27 NOTE — Progress Notes (Signed)
  Subjective:    Patient ID: Christina [name], female    DOB: 1965/06/08, 45 y.o.   MRN: [id number]  HPI Here to follow up on rashes on the body and face. She took 10 days of Septra and this helped a lot. She still has some lesions however. Her nose has cleared up completely with Bactroban.    Review of Systems  Constitutional: Negative.   Respiratory: Negative.   Cardiovascular: Negative.   Skin: Positive for rash.       Objective:   Physical Exam  Constitutional: She appears well-developed and well-nourished.  Skin:       Scattered red excoriated lesions and a few ecchymoses           Assessment & Plan:  Get labs today. Get back on Septra DS. Refer to Dermatology.

## 2010-10-01 ENCOUNTER — Telehealth: Payer: Self-pay | Admitting: Family Medicine

## 2010-10-01 ENCOUNTER — Telehealth: Payer: Self-pay | Admitting: *Deleted

## 2010-10-01 DIAGNOSIS — R21 Rash and other nonspecific skin eruption: Secondary | ICD-10-CM

## 2010-10-01 MED ORDER — POTASSIUM CHLORIDE 10 MEQ PO TBCR: 10.0000 meq | EXTENDED_RELEASE_TABLET | Freq: Every day | ORAL | Status: DC

## 2010-10-01 NOTE — Telephone Encounter (Signed)
Please set this referral up for rashes on the body and the face

## 2010-10-01 NOTE — Telephone Encounter (Signed)
Message copied by Trenton Gammon on Tue Oct 01, 2010  4:27 PM ------      Message from: Gershon Crane A      Created: Mon Sep 30, 2010 11:04 AM       Normal except her diabetes is mildly out of control, and her potassium is low. Watch the diet, get some exercise. Satrt on Klor-con 10 mEq daily. Call in one year supply

## 2010-10-01 NOTE — Telephone Encounter (Signed)
patient  Is aware of lab results and rx sent

## 2010-10-01 NOTE — Telephone Encounter (Signed)
Pt req to get a referral to see [facility] [phone], for skin rash as previously discussed.

## 2010-10-03 ENCOUNTER — Telehealth: Payer: Self-pay | Admitting: Family Medicine

## 2010-10-03 NOTE — Telephone Encounter (Signed)
This is not true. She does not need a doctor's  order to set up a mammogram. She had a breast US last year when she had some nipple discharge, but they recommended that go back to routine screening  Mammograms again this year

## 2010-10-03 NOTE — Telephone Encounter (Signed)
Pt would like to get a Mamogram done at the [facility] in [place]  [phone]) pt contacted the office and was told she had to have an order placed by her primary doctor to have this done. Please contact pt.

## 2010-10-06 ENCOUNTER — Other Ambulatory Visit: Payer: Self-pay | Admitting: Family Medicine

## 2010-10-07 NOTE — Telephone Encounter (Signed)
I called the [facility] and they do require a written order and fax to [phone]. Pt can then contact the office in a few days to schedule the appointment.

## 2010-10-08 NOTE — Telephone Encounter (Signed)
Script was faxed and pt aware.

## 2010-10-08 NOTE — Telephone Encounter (Signed)
done

## 2010-10-11 ENCOUNTER — Encounter: Payer: Self-pay | Admitting: Family Medicine

## 2010-10-11 ENCOUNTER — Ambulatory Visit (INDEPENDENT_AMBULATORY_CARE_PROVIDER_SITE_OTHER): Payer: BC Managed Care – PPO | Admitting: Family Medicine

## 2010-10-11 VITALS — BP 120/80 | HR 108 | Temp 98.1°F | Wt 218.0 lb

## 2010-10-11 DIAGNOSIS — G629 Polyneuropathy, unspecified: Secondary | ICD-10-CM

## 2010-10-11 DIAGNOSIS — G589 Mononeuropathy, unspecified: Secondary | ICD-10-CM

## 2010-10-11 DIAGNOSIS — R21 Rash and other nonspecific skin eruption: Secondary | ICD-10-CM

## 2010-10-11 DIAGNOSIS — L719 Rosacea, unspecified: Secondary | ICD-10-CM

## 2010-10-11 NOTE — Progress Notes (Signed)
  Subjective:    Patient ID: Christina [name], female    DOB: [birth year], 45 y.o.   MRN: [id number]  HPI Here to discuss recent lab results. She saw Ebony Cargo NP at [facility] recently for the facial rash she has been dealing with. They felt this was acne rosacea, and in fact she is improving quite nicely on the Septra DS we have been giving her. The dermatologist did labs that included a "positive" ANA but no titers were done. Christina [name] is very worried that she could have multiple sclerosis since there is a hx of MS in her family.    Review of Systems  Respiratory: Negative.   Cardiovascular: Negative.   Musculoskeletal: Positive for myalgias.  Skin: Positive for rash.  Neurological: Positive for weakness and numbness. Negative for headaches.       Objective:   Physical Exam  Constitutional: She appears well-developed and well-nourished.  Cardiovascular: Normal rate, regular rhythm, normal heart sounds and intact distal pulses.   Pulmonary/Chest: Effort normal and breath sounds normal.  Skin:       Her face has slight erythema but is much improved from last time          Assessment & Plan:  It is confirmed that she has rosacea, and we will continue her on Septra DS for now. We will get kabs today including a quantitative ANA with titers and a ds DNA. Refer to Neurology to rule out MS

## 2010-10-12 LAB — HIV ANTIBODY (ROUTINE TESTING W REFLEX): HIV: NONREACTIVE

## 2010-10-16 ENCOUNTER — Telehealth: Payer: Self-pay | Admitting: Family Medicine

## 2010-10-16 NOTE — Telephone Encounter (Signed)
Pt called req test results.

## 2010-10-17 ENCOUNTER — Telehealth: Payer: Self-pay | Admitting: Family Medicine

## 2010-10-17 ENCOUNTER — Encounter: Payer: Self-pay | Admitting: Family Medicine

## 2010-10-17 NOTE — Telephone Encounter (Signed)
I spoke with pt and gave lab results. She wants to know what the next step in her treatment plan is?

## 2010-10-17 NOTE — Telephone Encounter (Signed)
Pr called back again re: test results. Pt req a call back today.

## 2010-10-17 NOTE — Telephone Encounter (Signed)
Message copied by Baldemar Friday on Thu Oct 17, 2010  2:04 PM ------      Message from: Gershon Crane A      Created: Wed Oct 16, 2010  8:45 AM       Lupus test is negative. The ANA is negative .

## 2010-10-17 NOTE — Telephone Encounter (Signed)
Spoke with pt and gave results. 

## 2010-10-17 NOTE — Telephone Encounter (Signed)
Spoke with pt and gave results and also put a copy in mail, per pt request.

## 2010-10-17 NOTE — Telephone Encounter (Signed)
Message copied by Baldemar Friday on Thu Oct 17, 2010  2:04 PM ------      Message from: Gershon Crane A      Created: Mon Oct 14, 2010  5:49 AM       Her HIV is negative. The lupus tests are still pending

## 2010-10-18 NOTE — Telephone Encounter (Signed)
Left voice message.

## 2010-10-18 NOTE — Telephone Encounter (Signed)
We have already referred her to Neurology, so that is the next step

## 2010-10-28 ENCOUNTER — Other Ambulatory Visit: Payer: Self-pay | Admitting: Family Medicine

## 2010-10-29 NOTE — Telephone Encounter (Signed)
Script sent e-scribe 

## 2010-10-30 ENCOUNTER — Other Ambulatory Visit: Payer: Self-pay | Admitting: Family Medicine

## 2010-10-31 MED ORDER — OMEPRAZOLE 20 MG PO CPDR: 20.0000 mg | DELAYED_RELEASE_CAPSULE | Freq: Every day | ORAL | Status: DC

## 2010-10-31 NOTE — Telephone Encounter (Signed)
Addended by: Romualdo Bolk on: 10/31/2010 08:52 AM   Modules accepted: Orders

## 2010-11-06 ENCOUNTER — Telehealth: Payer: Self-pay | Admitting: Family Medicine

## 2010-11-06 NOTE — Telephone Encounter (Signed)
Refill for a year 

## 2010-11-06 NOTE — Telephone Encounter (Signed)
Refill request for B12 injection and pt last here on 10/11/10. Pt is injecting 1 ml once a week.

## 2010-11-07 MED ORDER — CYANOCOBALAMIN 1000 MCG/ML IJ SOLN: 1000.0000 ug | INTRAMUSCULAR | Status: DC

## 2010-11-07 NOTE — Telephone Encounter (Signed)
rx sent in electronically 

## 2011-01-03 ENCOUNTER — Other Ambulatory Visit: Payer: Self-pay | Admitting: Family Medicine

## 2011-01-25 ENCOUNTER — Other Ambulatory Visit: Payer: Self-pay | Admitting: Family Medicine

## 2011-03-31 ENCOUNTER — Encounter: Payer: Self-pay | Admitting: Family Medicine

## 2011-03-31 ENCOUNTER — Ambulatory Visit (INDEPENDENT_AMBULATORY_CARE_PROVIDER_SITE_OTHER)
Admission: RE | Admit: 2011-03-31 | Discharge: 2011-03-31 | Disposition: A | Discharge status: Home / Self Care | Payer: BC Managed Care – PPO | Source: Ambulatory Visit | Attending: Family Medicine | Admitting: Family Medicine

## 2011-03-31 ENCOUNTER — Ambulatory Visit (INDEPENDENT_AMBULATORY_CARE_PROVIDER_SITE_OTHER): Payer: BC Managed Care – PPO | Admitting: Family Medicine

## 2011-03-31 VITALS — BP 138/78 | HR 94 | Temp 98.2°F | Wt 214.0 lb

## 2011-03-31 DIAGNOSIS — IMO0002 Reserved for concepts with insufficient information to code with codable children: Secondary | ICD-10-CM

## 2011-03-31 DIAGNOSIS — K5289 Other specified noninfective gastroenteritis and colitis: Secondary | ICD-10-CM

## 2011-03-31 DIAGNOSIS — S93409A Sprain of unspecified ligament of unspecified ankle, initial encounter: Secondary | ICD-10-CM

## 2011-03-31 DIAGNOSIS — S53409A Unspecified sprain of unspecified elbow, initial encounter: Secondary | ICD-10-CM

## 2011-03-31 DIAGNOSIS — K529 Noninfective gastroenteritis and colitis, unspecified: Secondary | ICD-10-CM

## 2011-03-31 MED ORDER — OMEPRAZOLE 20 MG PO CPDR: 20.0000 mg | DELAYED_RELEASE_CAPSULE | Freq: Two times a day (BID) | ORAL | Status: DC

## 2011-03-31 MED ORDER — TRAMADOL HCL 50 MG PO TABS: 100.0000 mg | ORAL_TABLET | Freq: Two times a day (BID) | ORAL | Status: DC

## 2011-03-31 NOTE — Progress Notes (Signed)
  Subjective:    Patient ID: Christina [name], female    DOB: 12-Nov-1965, 47 y.o.   MRN: [id number]  HPI Here for several reasons. First she fell at home 2 months ago and landed on her right side. She injured the right elbow and the right ankle. Both are still a little swollen and painful. She walks with a cane. Also she is getting over a stomach virus, as is her husband. She had vomiting and diarrhea, but this has stopped. No fever.    Review of Systems  Constitutional: Negative.   Gastrointestinal: Positive for vomiting and diarrhea. Negative for abdominal pain.  Musculoskeletal: Positive for joint swelling.       Objective:   Physical Exam  Constitutional: She appears well-developed and well-nourished.  Abdominal: Soft. Bowel sounds are normal. She exhibits no distension and no mass. There is no tenderness. There is no rebound and no guarding.  Musculoskeletal:       Right lateral epicondyle is swollen and tender. No crepitus. Full ROM. Also the right lateral malleolus is tender and swollen. No crepitus and full ROM           Assessment & Plan:  She is recovering from her GI virus. I think she has sprained her ankle and elbow, but we will get Xrays today to rule out any fracture.

## 2011-04-01 NOTE — Progress Notes (Signed)
Quick Note:  Spoke with pt ______ 

## 2011-04-21 ENCOUNTER — Other Ambulatory Visit: Payer: Self-pay | Admitting: Family Medicine

## 2011-04-22 ENCOUNTER — Telehealth: Payer: Self-pay | Admitting: Family Medicine

## 2011-04-22 NOTE — Telephone Encounter (Signed)
Refill request for B12 injection and inject 1 ml once a week. Pt last here on 10/11/10.

## 2011-04-23 MED ORDER — CYANOCOBALAMIN 1000 MCG/ML IJ SOLN: 1000.0000 ug | INTRAMUSCULAR | Status: DC

## 2011-04-23 NOTE — Telephone Encounter (Signed)
Call in a one year supply  

## 2011-04-23 NOTE — Telephone Encounter (Signed)
Rx sent to pharmacy   

## 2011-04-26 ENCOUNTER — Other Ambulatory Visit: Payer: Self-pay | Admitting: Internal Medicine

## 2011-04-26 ENCOUNTER — Other Ambulatory Visit: Payer: Self-pay | Admitting: Family Medicine

## 2011-05-05 ENCOUNTER — Other Ambulatory Visit: Payer: Self-pay | Admitting: Family Medicine

## 2011-06-01 ENCOUNTER — Other Ambulatory Visit: Payer: Self-pay | Admitting: Family Medicine

## 2011-06-16 ENCOUNTER — Other Ambulatory Visit: Payer: Self-pay | Admitting: Family Medicine

## 2011-07-13 ENCOUNTER — Other Ambulatory Visit: Payer: Self-pay | Admitting: Family Medicine

## 2011-07-26 ENCOUNTER — Other Ambulatory Visit: Payer: Self-pay | Admitting: Family Medicine

## 2011-07-29 ENCOUNTER — Other Ambulatory Visit (INDEPENDENT_AMBULATORY_CARE_PROVIDER_SITE_OTHER): Payer: BC Managed Care – PPO

## 2011-07-29 DIAGNOSIS — Z0000 Encounter for general adult medical examination without abnormal findings: Secondary | ICD-10-CM

## 2011-07-29 LAB — BASIC METABOLIC PANEL
BUN: 15 mg/dL (ref 6–23)
Calcium: 8.6 mg/dL (ref 8.4–10.5)
Creatinine, Ser: 0.8 mg/dL (ref 0.4–1.2)
GFR: 86.73 mL/min (ref >60.00)

## 2011-07-29 LAB — CBC WITH DIFFERENTIAL/PLATELET
Eosinophils Relative: 1.2 % (ref 0.0–5.0)
Monocytes Absolute: 0.4 10*3/uL (ref 0.1–1.0)
Monocytes Relative: 5 % (ref 3.0–12.0)
Neutrophils Relative %: 69.2 % (ref 43.0–77.0)
Platelets: 192 10*3/uL (ref 150.0–400.0)
WBC: 7.5 10*3/uL (ref 4.5–10.5)

## 2011-07-29 LAB — LIPID PANEL
Cholesterol: 189 mg/dL (ref 0–200)
HDL: 53 mg/dL (ref >39.00)
Total CHOL/HDL Ratio: 4
VLDL: 47.6 mg/dL — ABNORMAL HIGH (ref 0.0–40.0)

## 2011-07-29 LAB — HEPATIC FUNCTION PANEL
AST: 19 U/L (ref 0–37)
Alkaline Phosphatase: 49 U/L (ref 39–117)
Total Bilirubin: 0.7 mg/dL (ref 0.3–1.2)

## 2011-07-29 LAB — MICROALBUMIN / CREATININE URINE RATIO: Microalb Creat Ratio: 1.3 mg/g (ref 0.0–30.0)

## 2011-07-29 LAB — POCT URINALYSIS DIPSTICK
Bilirubin, UA: NEGATIVE
Blood, UA: NEGATIVE
Ketones, UA: NEGATIVE
Spec Grav, UA: 1.025

## 2011-07-29 LAB — HEMOGLOBIN A1C: Hgb A1c MFr Bld: 7.9 % — ABNORMAL HIGH (ref 4.6–6.5)

## 2011-08-05 ENCOUNTER — Telehealth: Payer: Self-pay | Admitting: Family Medicine

## 2011-08-05 ENCOUNTER — Ambulatory Visit (INDEPENDENT_AMBULATORY_CARE_PROVIDER_SITE_OTHER): Payer: BC Managed Care – PPO | Admitting: Family Medicine

## 2011-08-05 ENCOUNTER — Encounter: Payer: Self-pay | Admitting: Family Medicine

## 2011-08-05 VITALS — BP 142/90 | HR 97 | Temp 98.5°F | Ht 62.75 in | Wt 220.0 lb

## 2011-08-05 DIAGNOSIS — Z0000 Encounter for general adult medical examination without abnormal findings: Secondary | ICD-10-CM

## 2011-08-05 MED ORDER — ESTROGENS CONJUGATED 0.625 MG PO TABS: 0.6250 mg | ORAL_TABLET | Freq: Every day | ORAL | Status: DC

## 2011-08-05 MED ORDER — HYDROMORPHONE HCL 4 MG PO TABS: 4.0000 mg | ORAL_TABLET | Freq: Four times a day (QID) | ORAL | Status: DC | PRN

## 2011-08-05 MED ORDER — PROMETHAZINE HCL 25 MG PO TABS: 25.0000 mg | ORAL_TABLET | Freq: Four times a day (QID) | ORAL | Status: DC | PRN

## 2011-08-05 MED ORDER — CYANOCOBALAMIN 1000 MCG/ML IJ SOLN: 1000.0000 ug | INTRAMUSCULAR | Status: DC

## 2011-08-05 MED ORDER — HALOBETASOL PROPIONATE 0.05 % EX CREA: TOPICAL_CREAM | Freq: Two times a day (BID) | CUTANEOUS | Status: AC

## 2011-08-05 NOTE — Progress Notes (Signed)
  Subjective:    Patient ID: Christina [name], female    DOB: 09-01-1965, 46 y.o.   MRN: [id number]  HPI 46 yr old female for a cpx. She feels well except for back pain and fatigue. She also asks about menopause. For the past year she has had a lot of hot flashes with sweats, and her libido is down. Her recent labs showed the A1c is up to 7.9.    Review of Systems  Constitutional: Negative.   HENT: Negative.   Eyes: Negative.   Respiratory: Negative.   Cardiovascular: Negative.   Gastrointestinal: Negative.   Genitourinary: Negative for dysuria, urgency, frequency, hematuria, flank pain, decreased urine volume, enuresis, difficulty urinating, pelvic pain and dyspareunia.  Musculoskeletal: Negative.   Skin: Negative.   Neurological: Negative.   Hematological: Negative.   Psychiatric/Behavioral: Negative.        Objective:   Physical Exam  Constitutional: She is oriented to person, place, and time. She appears well-developed and well-nourished. No distress.  HENT:  Head: Normocephalic and atraumatic.  Right Ear: External ear normal.  Left Ear: External ear normal.  Nose: Nose normal.  Mouth/Throat: Oropharynx is clear and moist. No oropharyngeal exudate.  Eyes: Conjunctivae and EOM are normal. Pupils are equal, round, and reactive to light. No scleral icterus.  Neck: Normal range of motion. Neck supple. No JVD present. No thyromegaly present.  Cardiovascular: Normal rate, regular rhythm, normal heart sounds and intact distal pulses.  Exam reveals no gallop and no friction rub.   No murmur heard. Pulmonary/Chest: Effort normal and breath sounds normal. No respiratory distress. She has no wheezes. She has no rales. She exhibits no tenderness.  Abdominal: Soft. Bowel sounds are normal. She exhibits no distension and no mass. There is no tenderness. There is no rebound and no guarding.  Musculoskeletal: Normal range of motion. She exhibits no edema and no tenderness.  Lymphadenopathy:   She has no cervical adenopathy.  Neurological: She is alert and oriented to person, place, and time. She has normal reflexes. No cranial nerve deficit. She exhibits normal muscle tone. Coordination normal.  Skin: Skin is warm and dry. No rash noted. No erythema.  Psychiatric: She has a normal mood and affect. Her behavior is normal. Judgment and thought content normal.          Assessment & Plan:  Well exam. We discussed diet and exercise to lose weight. Start on Metformin for the diabetes. Try Premarin  Daily.

## 2011-08-05 NOTE — Addendum Note (Signed)
Addended by: Gershon Crane A on: 08/05/2011 10:20 AM   Modules accepted: Orders

## 2011-08-05 NOTE — Progress Notes (Signed)
Quick Note:  Pt is here now for CPE and the doctor will go over. ______

## 2011-08-05 NOTE — Telephone Encounter (Signed)
Patient called stating that she was supposed to metformin and it was not called into her pharmacy [facility] [place] [address] Please assist.

## 2011-08-06 MED ORDER — METFORMIN HCL 1000 MG PO TABS: 1000.0000 mg | ORAL_TABLET | Freq: Two times a day (BID) | ORAL | Status: DC

## 2011-08-06 NOTE — Telephone Encounter (Signed)
done

## 2011-09-02 ENCOUNTER — Encounter: Payer: Self-pay | Admitting: Family Medicine

## 2011-09-20 ENCOUNTER — Other Ambulatory Visit: Payer: Self-pay | Admitting: Family Medicine

## 2011-09-27 ENCOUNTER — Other Ambulatory Visit: Payer: Self-pay | Admitting: Family Medicine

## 2011-12-22 ENCOUNTER — Ambulatory Visit (HOSPITAL_COMMUNITY)
Admission: RE | Admit: 2011-12-22 | Discharge: 2011-12-22 | Disposition: A | Discharge status: Home / Self Care | Payer: BC Managed Care – PPO | Source: Ambulatory Visit | Attending: Chiropractic Medicine | Admitting: Chiropractic Medicine

## 2011-12-22 ENCOUNTER — Other Ambulatory Visit (HOSPITAL_COMMUNITY): Payer: Self-pay | Admitting: Chiropractic Medicine

## 2011-12-22 ENCOUNTER — Ambulatory Visit (HOSPITAL_COMMUNITY): Payer: BC Managed Care – PPO

## 2011-12-22 DIAGNOSIS — M542 Cervicalgia: Secondary | ICD-10-CM | POA: Insufficient documentation

## 2011-12-22 DIAGNOSIS — M5412 Radiculopathy, cervical region: Secondary | ICD-10-CM

## 2011-12-22 DIAGNOSIS — S60219A Contusion of unspecified wrist, initial encounter: Secondary | ICD-10-CM | POA: Insufficient documentation

## 2011-12-28 ENCOUNTER — Other Ambulatory Visit: Payer: Self-pay | Admitting: Family Medicine

## 2012-01-01 ENCOUNTER — Other Ambulatory Visit: Payer: Self-pay | Admitting: Family Medicine

## 2012-01-01 MED ORDER — HYDROMORPHONE HCL 4 MG PO TABS: 4.0000 mg | ORAL_TABLET | Freq: Four times a day (QID) | ORAL | Status: DC | PRN

## 2012-01-01 NOTE — Telephone Encounter (Signed)
done

## 2012-01-01 NOTE — Telephone Encounter (Signed)
Pt called req rx for HYDROmorphone (DILAUDID) 4 MG tablet and also traMADol (ULTRAM) 50 MG tablet [facility] [place]. Pts spouse will pick up script when ready.

## 2012-01-25 ENCOUNTER — Other Ambulatory Visit: Payer: Self-pay | Admitting: Family Medicine

## 2012-02-20 ENCOUNTER — Ambulatory Visit (INDEPENDENT_AMBULATORY_CARE_PROVIDER_SITE_OTHER): Payer: BC Managed Care – PPO | Admitting: Family Medicine

## 2012-02-20 ENCOUNTER — Encounter: Payer: Self-pay | Admitting: Family Medicine

## 2012-02-20 VITALS — BP 162/94 | HR 90 | Temp 97.7°F | Wt 216.0 lb

## 2012-02-20 DIAGNOSIS — E119 Type 2 diabetes mellitus without complications: Secondary | ICD-10-CM

## 2012-02-20 DIAGNOSIS — I10 Essential (primary) hypertension: Secondary | ICD-10-CM

## 2012-02-20 DIAGNOSIS — J40 Bronchitis, not specified as acute or chronic: Secondary | ICD-10-CM

## 2012-02-20 DIAGNOSIS — Z780 Asymptomatic menopausal state: Secondary | ICD-10-CM

## 2012-02-20 DIAGNOSIS — L989 Disorder of the skin and subcutaneous tissue, unspecified: Secondary | ICD-10-CM

## 2012-02-20 LAB — HEMOGLOBIN A1C: Hgb A1c MFr Bld: 6.5 % (ref 4.6–6.5)

## 2012-02-20 MED ORDER — AZITHROMYCIN 250 MG PO TABS: ORAL_TABLET | ORAL | Status: DC

## 2012-02-20 MED ORDER — ESTROGENS CONJUGATED 0.9 MG PO TABS: 0.9000 mg | ORAL_TABLET | Freq: Every day | ORAL | Status: DC

## 2012-02-20 MED ORDER — MUPIROCIN 2 % EX OINT: TOPICAL_OINTMENT | Freq: Three times a day (TID) | CUTANEOUS | Status: DC

## 2012-02-20 NOTE — Progress Notes (Signed)
  Subjective:    Patient ID: Christina [name], female    DOB: 1965/04/22, 47 y.o.   MRN: [id number]  HPI Here for several issues. First she has had chest tightness and coughing up yellow sputum for 3 days. No fever. Also her face has cleared up a lot by taking Bactrim DS but she would like better results. She is on Premarin 0.625mg  daily but still has some hot flashes. She stopped taking Metformin 5 months ago due to nausea.   Review of Systems  Constitutional: Negative.   HENT: Negative.   Eyes: Negative.   Respiratory: Positive for cough, chest tightness and shortness of breath. Negative for wheezing.   Cardiovascular: Negative.        Objective:   Physical Exam  Constitutional: She appears well-developed and well-nourished. No distress.  HENT:  Right Ear: External ear normal.  Left Ear: External ear normal.  Nose: Nose normal.  Mouth/Throat: Oropharynx is clear and moist.  Eyes: Conjunctivae normal are normal.  Cardiovascular: Normal rate, regular rhythm, normal heart sounds and intact distal pulses.   Pulmonary/Chest: Effort normal. No respiratory distress. She has no wheezes. She has no rales.       Soft rhonchi   Lymphadenopathy:    She has no cervical adenopathy.  Skin:       Scattered red macules and patches around the mouth           Assessment & Plan:  Treat the bronchitis with a Zpack. Increase the Premarin to 0.9 mg daily. Add Bactroban ointment to the Bactrim DS for her face. Check an A1c today.

## 2012-02-24 NOTE — Progress Notes (Signed)
Quick Note:  I left voice message with results. ______ 

## 2012-03-01 ENCOUNTER — Encounter: Payer: Self-pay | Admitting: Family Medicine

## 2012-03-03 ENCOUNTER — Other Ambulatory Visit: Payer: Self-pay | Admitting: Family Medicine

## 2012-03-04 ENCOUNTER — Telehealth: Payer: Self-pay | Admitting: Family Medicine

## 2012-03-04 MED ORDER — AZITHROMYCIN 250 MG PO TABS: ORAL_TABLET | ORAL | Status: DC

## 2012-03-04 NOTE — Telephone Encounter (Signed)
[facility] Dr. Clent Ridges.

## 2012-03-13 ENCOUNTER — Other Ambulatory Visit: Payer: Self-pay

## 2012-03-25 ENCOUNTER — Encounter: Payer: Self-pay | Admitting: Family Medicine

## 2012-05-10 ENCOUNTER — Telehealth: Payer: Self-pay | Admitting: Family Medicine

## 2012-05-10 DIAGNOSIS — E538 Deficiency of other specified B group vitamins: Secondary | ICD-10-CM

## 2012-05-10 NOTE — Telephone Encounter (Signed)
Refill request for Cyanocobalamin 1,000 mcg/ml.

## 2012-05-10 NOTE — Telephone Encounter (Signed)
Refill for one year, but she needs to set up for a level to be drawn soon

## 2012-05-11 ENCOUNTER — Other Ambulatory Visit: Payer: Self-pay | Admitting: Family Medicine

## 2012-05-11 DIAGNOSIS — E538 Deficiency of other specified B group vitamins: Secondary | ICD-10-CM

## 2012-05-11 MED ORDER — CYANOCOBALAMIN 1000 MCG/ML IJ SOLN: 1000.0000 ug | INTRAMUSCULAR | Status: DC

## 2012-05-11 NOTE — Telephone Encounter (Signed)
I sent script e-scribe and put future lab order in computer, left a voice message for pt to schedule the lab draw.

## 2012-05-17 ENCOUNTER — Other Ambulatory Visit: Payer: Self-pay | Admitting: Family Medicine

## 2012-05-18 ENCOUNTER — Telehealth: Payer: Self-pay | Admitting: Family Medicine

## 2012-05-18 MED ORDER — HYDROCHLOROTHIAZIDE 25 MG PO TABS: 25.0000 mg | ORAL_TABLET | Freq: Every day | ORAL | Status: DC

## 2012-05-18 NOTE — Telephone Encounter (Signed)
Refill request for HCTZ 25 mg and I did send e-scribe

## 2012-06-02 ENCOUNTER — Telehealth: Payer: Self-pay | Admitting: Family Medicine

## 2012-06-02 MED ORDER — CYCLOBENZAPRINE HCL 10 MG PO TABS: 10.0000 mg | ORAL_TABLET | Freq: Three times a day (TID) | ORAL | Status: DC | PRN

## 2012-06-02 NOTE — Telephone Encounter (Signed)
Refill request for Cyclobenzaprine 10 mg and I did send e-scribe

## 2012-07-21 ENCOUNTER — Other Ambulatory Visit: Payer: Self-pay | Admitting: Family Medicine

## 2012-08-12 ENCOUNTER — Other Ambulatory Visit: Payer: Self-pay | Admitting: Family Medicine

## 2012-08-21 ENCOUNTER — Other Ambulatory Visit: Payer: Self-pay | Admitting: Family Medicine

## 2012-10-04 ENCOUNTER — Other Ambulatory Visit: Payer: Self-pay | Admitting: Family Medicine

## 2012-10-04 NOTE — Telephone Encounter (Signed)
Refill for 6 months. 

## 2012-10-08 ENCOUNTER — Encounter: Payer: Self-pay | Admitting: Family Medicine

## 2012-10-08 ENCOUNTER — Ambulatory Visit (INDEPENDENT_AMBULATORY_CARE_PROVIDER_SITE_OTHER): Payer: BC Managed Care – PPO | Admitting: Family Medicine

## 2012-10-08 VITALS — BP 140/70 | HR 90 | Temp 97.7°F | Wt 225.0 lb

## 2012-10-08 DIAGNOSIS — I10 Essential (primary) hypertension: Secondary | ICD-10-CM

## 2012-10-08 DIAGNOSIS — Z23 Encounter for immunization: Secondary | ICD-10-CM

## 2012-10-08 DIAGNOSIS — M545 Low back pain: Secondary | ICD-10-CM

## 2012-10-08 DIAGNOSIS — G609 Hereditary and idiopathic neuropathy, unspecified: Secondary | ICD-10-CM

## 2012-10-08 DIAGNOSIS — E119 Type 2 diabetes mellitus without complications: Secondary | ICD-10-CM

## 2012-10-08 DIAGNOSIS — R413 Other amnesia: Secondary | ICD-10-CM

## 2012-10-08 DIAGNOSIS — M542 Cervicalgia: Secondary | ICD-10-CM

## 2012-10-08 MED ORDER — HYDROMORPHONE HCL 4 MG PO TABS: 4.0000 mg | ORAL_TABLET | Freq: Four times a day (QID) | ORAL | Status: DC | PRN

## 2012-10-08 MED ORDER — CYANOCOBALAMIN 1000 MCG/ML IJ SOLN: 1000.0000 ug | INTRAMUSCULAR | Status: DC

## 2012-10-08 MED ORDER — PHENTERMINE HCL 37.5 MG PO CAPS: 37.5000 mg | ORAL_CAPSULE | ORAL | Status: DC

## 2012-10-08 NOTE — Addendum Note (Signed)
Addended by: Aniceto Boss A on: 10/08/2012 10:24 AM   Modules accepted: Orders

## 2012-10-08 NOTE — Progress Notes (Signed)
  Subjective:    Patient ID: Christina [name], female    DOB: August 23, 1965, 47 y.o.   MRN: [id number]  HPI Here to follow up. She has been seeing Dr. Jacqualyn Posey, a chiropractor, for her neck pain and right arm numbness. He recently got a cervical spine MRI which revealed spinal stenosis. He has referred her to see Dr. Venita Lick for this. She has had a NCS showing severe right and moderate left carpal tunnel syndrome as well. She tries to watch her diet but cannot control her appetite. She asks for help with this. Lastly she has concerns about some memory issues, and she is worried because of a family hx of dementia.    Review of Systems  Constitutional: Negative.   Respiratory: Negative.   Cardiovascular: Negative.        Objective:   Physical Exam  Constitutional: She is oriented to person, place, and time. She appears well-developed and well-nourished.  Cardiovascular: Normal rate, regular rhythm, normal heart sounds and intact distal pulses.   Pulmonary/Chest: Effort normal and breath sounds normal.  Neurological: She is alert and oriented to person, place, and time.          Assessment & Plan:  Get an A1c today. Try Phentermine for 6 months. Refer to Neurology to assess her memory. She will see Dr. Shon Baton as above.

## 2012-10-09 ENCOUNTER — Other Ambulatory Visit: Payer: Self-pay | Admitting: Family Medicine

## 2012-10-12 NOTE — Progress Notes (Signed)
Quick Note:  I left message for pt to check results on my chart and I did release them. ______

## 2012-10-26 ENCOUNTER — Ambulatory Visit: Payer: BC Managed Care – PPO | Admitting: Neurology

## 2012-10-26 ENCOUNTER — Encounter: Payer: Self-pay | Admitting: Neurology

## 2012-10-26 ENCOUNTER — Ambulatory Visit (INDEPENDENT_AMBULATORY_CARE_PROVIDER_SITE_OTHER): Payer: BC Managed Care – PPO | Admitting: Neurology

## 2012-10-26 VITALS — BP 150/80 | HR 72 | Temp 97.7°F | Ht 63.0 in | Wt 224.0 lb

## 2012-10-26 DIAGNOSIS — R413 Other amnesia: Secondary | ICD-10-CM

## 2012-10-26 NOTE — Progress Notes (Signed)
NEUROLOGY CONSULTATION NOTE  Christina [name] MRN: [id number] DOB: December 13, 1965  Referring provider: Dr. Clent Ridges Primary care provider: Dr. Clent Ridges  Reason for consult:  Memory problems.  HISTORY OF PRESENT ILLNESS: Christina [name] is a 47 year old right-handed woman with history of cervical stenosis with right arm numbness, hypertension, asthma, and type II diabetes who presents for memory deficits.  She is accompanied by her husband.  Records and images were personally reviewed where available.    Symptoms have been progressively worsening over at least 13 years.  Specifically, she tends to stop mid-sentence and forget what she was talking about.  It is not word-finding difficulties.  She requires a pill box to remember to take medications.  She does not drive.  She does not have trouble recognizing faces of acquaintances.  She does not have difficulty with remembering how to perform daily tasks.  She has depression, but feels it is well controlled.  She denies hallucinations.  She has history of poor sleep.  She is fatigued when she wakes up in the morning and naps during the day.  Her husband says she snores and there may be brief lapses in breathing.  She is obese.  She has history of chronic pain due to peripheral neuropathy with degenerative disc disease and stenosis.  She takes multiple medications for pain, such as hydrocomorphone, flexeril, tramadol and gabapentin.  She has a history of a benign brain tumor, diagnosed in the 90s and had radiation.  She says it was extraparenchymal, so it may have been a meningioma.  In 1995, she had spinal meningitis.  She feels her health started to decline since then.  She was evaluated by a neurologist in [place] in 2010.  She was experiencing numbness and tingling.  ANA was positive but repeat testing was negative.  She had a repeat MRI, which did not reveal evidence of tumor, but there were "spots".  An LP was performed, which was reportedly normal.  She had an EMG  which revealed neuropathy, cervical radiculopathy and carpal tunnel syndrome.  She takes supplemental B12.  Her maternal grandfather had Alzheimer's dementia.  03/29/10: B12 1364 07/29/11: TSH 4.71.  PAST MEDICAL HISTORY: Past Medical History  Diagnosis Date  . Allergy   . Asthma   . GERD (gastroesophageal reflux disease)   . Hypertension   . Meningitis   . Hx of colonic polyps   . Low back pain   . Interstitial cystitis     per Dr. Isabel Caprice  . Diabetes mellitus   . Angioedema   . Eczema     PAST SURGICAL HISTORY: Past Surgical History  Procedure Laterality Date  . Abdominal hysterectomy    . Tubal ligation      bilateral  . Brain tumor excision      benign 1984  . Tonsillectomy    . Cystic mass  4.4.11    remval from abdomen per Dr. Hardin Negus at [facility]  . Svd x 2      MEDICATIONS: Current Outpatient Prescriptions on File Prior to Visit  Medication Sig Dispense Refill  . aspirin 81 MG EC tablet Take 81 mg by mouth daily.        . B-D 3CC LUER-LOK SYR 25GX1/2" 25G X 1-1/2" 3 ML MISC USE AS DIRECTED  100 each  0  . [facility] Ultra Thin Lancets MISC by Does not apply route as directed.        . cyanocobalamin (,VITAMIN B-12,) 1000 MCG/ML injection Inject 1 mL (  1,000 mcg total) into the muscle once a week.  10 mL  11  . cyclobenzaprine (FLEXERIL) 10 MG tablet Take 1 tablet (10 mg total) by mouth 3 (three) times daily as needed for muscle spasms.  90 tablet  1  . desonide (DESOWEN) 0.05 % cream Apply topically 2 (two) times daily.        [name] diltiazem (CARDIZEM CD) 240 MG 24 hr capsule TAKE ONE CAPSULE BY MOUTH ONCE DAILY  30 capsule  6  . diphenhydrAMINE (BENADRYL) 25 mg capsule Take 25 mg by mouth as needed.        [name] estrogens, conjugated, (PREMARIN) 0.9 MG tablet Take 1 tablet (0.9 mg total) by mouth daily. Take daily for 21 days then do not take for 7 days.  30 tablet  11  . fenofibrate 160 MG tablet TAKE 1 TABLET BY MOUTH EVERY DAY  30 tablet  1  . gabapentin  (NEURONTIN) 300 MG capsule TAKE ONE CAPSULE 2 TIMES A DAY  60 capsule  5  . hydrochlorothiazide (HYDRODIURIL) 25 MG tablet Take 1 tablet (25 mg total) by mouth daily.  30 tablet  6  . HYDROmorphone (DILAUDID) 4 MG tablet Take 1 tablet (4 mg total) by mouth every 6 (six) hours as needed for pain.  60 tablet  0  . ketoconazole (NIZORAL) 2 % shampoo Apply topically 2 (two) times a week.        [name] KLOR-CON M10 10 MEQ tablet TAKE 1 TABLET (10 MEQ TOTAL) BY MOUTH DAILY.  90 tablet  3  . mupirocin ointment (BACTROBAN) 2 % Apply topically 3 (three) times daily.  30 g  5  . omeprazole (PRILOSEC) 20 MG capsule Take 1 capsule (20 mg total) by mouth 2 (two) times daily.  60 capsule  11  . phentermine 37.5 MG capsule Take 1 capsule (37.5 mg total) by mouth every morning.  30 capsule  5  . promethazine (PHENERGAN) 25 MG tablet Take 1 tablet (25 mg total) by mouth every 6 (six) hours as needed for nausea.  60 tablet  5  . sertraline (ZOLOFT) 100 MG tablet TAKE 1 TABLET (100 MG TOTAL) BY MOUTH DAILY.  30 tablet  11  . sulfamethoxazole-trimethoprim (BACTRIM DS) 800-160 MG per tablet TAKE 1 TABLET BY MOUTH TWICE A DAY  60 tablet  6  . SYRINGE-NEEDLE, DISP, 3 ML (MONOJECT 3CC SYR 25GX1-1/4") 25G X 1-1/4" 3 ML MISC by Does not apply route as directed.        . traMADol (ULTRAM) 50 MG tablet TAKE 2 TABLETS TWICE A DAY  120 tablet  5  . [DISCONTINUED] potassium chloride (KLOR-CON 10) 10 MEQ CR tablet Take 1 tablet (10 mEq total) by mouth daily.  90 tablet  3   No current facility-administered medications on file prior to visit.    ALLERGIES: Allergies  Allergen Reactions  . Albuterol   . Codeine   . Doxycycline   . Lisinopril     REACTION: facial swelling  . Metronidazole     REACTION: rash  . Ondansetron Hcl     REACTION: rash  . Oxycodone-Acetaminophen   . Tylox [Oxycodone-Acetaminophen]   . Vancomycin     REACTION: rash    FAMILY HISTORY: Family History  Problem Relation Age of Onset  .  Hypertension      family hx  . Stroke Mother   . Stroke Father   . Rheum arthritis Mother     SOCIAL HISTORY: History   Social History  .  Marital Status: Married    Spouse Name: N/A    Number of Children: N/A  . Years of Education: N/A   Occupational History  . Not on file.   Social History Main Topics  . Smoking status: Former Games developer  . Smokeless tobacco: Never Used  . Alcohol Use: No  . Drug Use: No  . Sexual Activity: Not on file   Other Topics Concern  . Not on file   Social History Narrative  . No narrative on file    REVIEW OF SYSTEMS: Constitutional: No fevers, chills, or sweats, no generalized fatigue, change in appetite Eyes: No visual changes, double vision, eye pain Ear, nose and throat: No hearing loss, ear pain, nasal congestion, sore throat Cardiovascular: No chest pain, palpitations Respiratory:  No shortness of breath at rest or with exertion, wheezes GastrointestinaI: No nausea, vomiting, diarrhea, abdominal pain, fecal incontinence Genitourinary:  No dysuria, urinary retention or frequency Musculoskeletal:  No neck pain, back pain Integumentary: No rash, pruritus, skin lesions Neurological: as above Psychiatric: No depression, insomnia, anxiety Endocrine: No palpitations, fatigue, diaphoresis, mood swings, change in appetite, change in weight, increased thirst Hematologic/Lymphatic:  No anemia, purpura, petechiae. Allergic/Immunologic: no itchy/runny eyes, nasal congestion, recent allergic reactions, rashes  PHYSICAL EXAM: Filed Vitals:   10/26/12 0943  BP: 150/80  Pulse: 72  Temp: 97.7 F (36.5 C)   General: No acute distress Head:  Normocephalic/atraumatic Neck: supple, no paraspinal tenderness, full range of motion Back: No paraspinal tenderness Heart: regular rate and rhythm Lungs: Clear to auscultation bilaterally. Vascular: No carotid bruits. Neurological Exam: Mental status: Orientation: intact (6 pts), speech fluent and not  dysarthric, Visuospatial/Executive:  Able to draw Trail Making test and clock correctly, cube incorrect (4 pts).  Naming:  Intact (3pts).  Attention:  Difficulty repeating numbers in reverse order and difficulty with serial 7 subtraction (4 pts), Language: incorrectly repeated word-for-word one of the sentences, difficulty with naming fluency (1 pt), Abstraction: some difficulty (1 pt), Delayed Recall: 2/5 words.  MOCA 21/30 Cranial nerves: CN I: not tested CN II: pupils equal, round and reactive to light, visual fields intact, fundi unremarkable. CN III, IV, VI:  full range of motion, no nystagmus, no ptosis CN V: facial sensation intact CN VII: upper and lower face symmetric CN VIII: hearing intact CN IX, X: gag intact, uvula midline CN XI: sternocleidomastoid and trapezius muscles intact CN XII: tongue midline Bulk & Tone: normal, no fasciculations. Motor: 5/5 UE bilaterally, 4+/5 left hip flexion and knee extension, otherwise 5-/5. Sensation: reduced temperature and vibration in toes Deep Tendon Reflexes: 2+ UE, 3+ LE, toes down Finger to nose testing: no dysmetria Gait: antalgic, requiring cane. Romberg not tested due to gait instability.  IMPRESSION: Memory problems.  May have cognitive impairment vs effects of medication or possible sleep apnea.  PLAN: 1.  Referral for neuropsychological testing 2.  Will try and obtain MRI brain images and report that was performed 2 years ago. 3.  Recommend evaluation for sleep apnea.  45 minutes spent with patient, over 50% spent counseling and coordinating care.  Thank you for allowing me to take part in the care of this patient.  Shon Millet, DO  CC:  Gershon Crane, MD

## 2012-10-26 NOTE — Patient Instructions (Addendum)
1.  We will schedule for neuropsychological testing. 2.  We will get MRI and report for me to review 3.  Would recommend referral to sleep medicine to evaluate for sleep apnea. 4.  Follow up in 3 months.

## 2012-11-04 ENCOUNTER — Other Ambulatory Visit: Payer: Self-pay | Admitting: Family Medicine

## 2012-11-04 NOTE — Telephone Encounter (Signed)
Can we refill this? 

## 2012-11-05 NOTE — Telephone Encounter (Signed)
Okay for one year  

## 2012-12-12 ENCOUNTER — Other Ambulatory Visit: Payer: Self-pay | Admitting: Family Medicine

## 2012-12-20 ENCOUNTER — Other Ambulatory Visit: Payer: Self-pay | Admitting: Family Medicine

## 2012-12-21 NOTE — Telephone Encounter (Signed)
Can we refill this? Looks like pt might be due for labs? 

## 2013-01-24 ENCOUNTER — Ambulatory Visit: Payer: BC Managed Care – PPO | Admitting: Neurology

## 2013-02-18 ENCOUNTER — Encounter: Payer: Self-pay | Admitting: Family Medicine

## 2013-02-18 ENCOUNTER — Ambulatory Visit (INDEPENDENT_AMBULATORY_CARE_PROVIDER_SITE_OTHER): Payer: BC Managed Care – PPO | Admitting: Family Medicine

## 2013-02-18 VITALS — BP 140/78 | HR 87 | Temp 98.1°F | Ht 63.0 in | Wt 218.0 lb

## 2013-02-18 DIAGNOSIS — M545 Low back pain, unspecified: Secondary | ICD-10-CM

## 2013-02-18 DIAGNOSIS — I10 Essential (primary) hypertension: Secondary | ICD-10-CM

## 2013-02-18 DIAGNOSIS — E119 Type 2 diabetes mellitus without complications: Secondary | ICD-10-CM

## 2013-02-18 DIAGNOSIS — M542 Cervicalgia: Secondary | ICD-10-CM

## 2013-02-18 LAB — CBC WITH DIFFERENTIAL/PLATELET
Basophils Absolute: 0 10*3/uL (ref 0.0–0.1)
Basophils Relative: 0.3 % (ref 0.0–3.0)
Eosinophils Absolute: 0.1 10*3/uL (ref 0.0–0.7)
Eosinophils Relative: 1 % (ref 0.0–5.0)
HCT: 37.7 % (ref 36.0–46.0)
HEMOGLOBIN: 12.6 g/dL (ref 12.0–15.0)
LYMPHS PCT: 21.7 % (ref 12.0–46.0)
Lymphs Abs: 1.6 10*3/uL (ref 0.7–4.0)
MCHC: 33.5 g/dL (ref 30.0–36.0)
MCV: 86.5 fl (ref 78.0–100.0)
MONOS PCT: 4.1 % (ref 3.0–12.0)
Monocytes Absolute: 0.3 10*3/uL (ref 0.1–1.0)
NEUTROS ABS: 5.4 10*3/uL (ref 1.4–7.7)
Neutrophils Relative %: 72.9 % (ref 43.0–77.0)
Platelets: 200 10*3/uL (ref 150.0–400.0)
RBC: 4.36 Mil/uL (ref 3.87–5.11)
RDW: 13.7 % (ref 11.5–14.6)
WBC: 7.5 10*3/uL (ref 4.5–10.5)

## 2013-02-18 LAB — BASIC METABOLIC PANEL
BUN: 16 mg/dL (ref 6–23)
CHLORIDE: 102 meq/L (ref 96–112)
CO2: 29 mEq/L (ref 19–32)
Calcium: 8.8 mg/dL (ref 8.4–10.5)
Creatinine, Ser: 0.7 mg/dL (ref 0.4–1.2)
GFR: 90.26 mL/min (ref >60.00)
Glucose, Bld: 111 mg/dL — ABNORMAL HIGH (ref 70–99)
POTASSIUM: 4.1 meq/L (ref 3.5–5.1)
SODIUM: 140 meq/L (ref 135–145)

## 2013-02-18 LAB — TSH: TSH: 1.74 u[IU]/mL (ref 0.35–5.50)

## 2013-02-18 LAB — HEPATIC FUNCTION PANEL
ALK PHOS: 49 U/L (ref 39–117)
ALT: 13 U/L (ref 0–35)
AST: 11 U/L (ref 0–37)
Albumin: 3.8 g/dL (ref 3.5–5.2)
BILIRUBIN DIRECT: 0 mg/dL (ref 0.0–0.3)
TOTAL PROTEIN: 7 g/dL (ref 6.0–8.3)
Total Bilirubin: 0.4 mg/dL (ref 0.3–1.2)

## 2013-02-18 LAB — HEMOGLOBIN A1C: Hgb A1c MFr Bld: 6.5 % (ref 4.6–6.5)

## 2013-02-18 MED ORDER — HYDROMORPHONE HCL 4 MG PO TABS: 4.0000 mg | ORAL_TABLET | Freq: Four times a day (QID) | ORAL | Status: DC | PRN

## 2013-02-18 MED ORDER — KETOCONAZOLE 2 % EX SHAM: MEDICATED_SHAMPOO | CUTANEOUS | Status: DC

## 2013-02-18 MED ORDER — CVS ULTRA THIN LANCETS MISC
1.0000 | Status: DC
Start: 2013-02-18 — End: 2022-01-03

## 2013-02-18 MED ORDER — SULFAMETHOXAZOLE-TMP DS 800-160 MG PO TABS: ORAL_TABLET | ORAL | Status: DC

## 2013-02-18 MED ORDER — GABAPENTIN 300 MG PO CAPS: 600.0000 mg | ORAL_CAPSULE | Freq: Three times a day (TID) | ORAL | Status: DC

## 2013-02-18 NOTE — Progress Notes (Signed)
Pre visit review using our clinic review tool, if applicable. No additional management support is needed unless otherwise documented below in the visit note. 

## 2013-02-18 NOTE — Progress Notes (Signed)
   Subjective:    Patient ID: Christina [name], female    DOB: 09-Nov-1965, 48 y.o.   MRN: [id number]  HPI Here to follow up. She is doing well in general but the neuropathy in her feet has gotten worse. They burn constantly. She is only taking 600 mg total of gabapentin daily.    Review of Systems  Constitutional: Negative.   Respiratory: Negative.   Cardiovascular: Negative.        Objective:   Physical Exam  Constitutional: She appears well-developed and well-nourished.  Cardiovascular: Normal rate, regular rhythm, normal heart sounds and intact distal pulses.   Pulmonary/Chest: Effort normal and breath sounds normal.          Assessment & Plan:  Increase gabapentin to 600 mg TID. Get labs today

## 2013-02-21 ENCOUNTER — Telehealth: Payer: Self-pay | Admitting: Family Medicine

## 2013-02-21 NOTE — Telephone Encounter (Signed)
Relevant patient education assigned to patient using Emmi. ° °

## 2013-02-22 ENCOUNTER — Telehealth: Payer: Self-pay

## 2013-02-22 NOTE — Telephone Encounter (Signed)
Relevant patient education assigned to patient using Emmi. ° °

## 2013-03-14 ENCOUNTER — Encounter: Payer: Self-pay | Admitting: Family Medicine

## 2013-04-04 ENCOUNTER — Other Ambulatory Visit: Payer: Self-pay | Admitting: Family Medicine

## 2013-05-09 ENCOUNTER — Encounter: Payer: Self-pay | Admitting: Family Medicine

## 2013-05-11 NOTE — Telephone Encounter (Signed)
Call in Tramadol #120 with 5 rf, also test strips

## 2013-05-12 ENCOUNTER — Telehealth: Payer: Self-pay | Admitting: Family Medicine

## 2013-05-12 NOTE — Telephone Encounter (Signed)
[id number] [place], [place] - [address] requesting refill of traMADol (ULTRAM) 50 MG tablet

## 2013-05-13 MED ORDER — TRAMADOL HCL 50 MG PO TABS: ORAL_TABLET | ORAL | Status: DC

## 2013-05-13 MED ORDER — GLUCOSE BLOOD VI STRP: ORAL_STRIP | Status: DC

## 2013-05-13 NOTE — Telephone Encounter (Signed)
I called in script 

## 2013-05-18 ENCOUNTER — Telehealth: Payer: Self-pay | Admitting: Family Medicine

## 2013-05-18 NOTE — Telephone Encounter (Signed)
I received a denial for True Test strips.  [employer] One touch Ultra Blue or One Touch Verio or [employer] Next or [employer] 2 are the preferred products.

## 2013-05-19 NOTE — Telephone Encounter (Signed)
Please send in for the appropriate strips

## 2013-05-20 NOTE — Telephone Encounter (Signed)
I spoke with pt and she has enough strips to last until next week. We need to call the pharmacy on Monday 05/23/13 to find out what they have in stock or if they need to order something.

## 2013-05-23 MED ORDER — ONETOUCH VERIO W/DEVICE KIT: 1.0000 | PACK | Freq: Every day | Status: DC

## 2013-05-23 MED ORDER — GLUCOSE BLOOD VI STRP: ORAL_STRIP | Status: DC

## 2013-05-23 MED ORDER — LANCETS MISC: Status: DC

## 2013-05-23 NOTE — Telephone Encounter (Signed)
I spoke with pt and sent order for One Touch Verio machine, testing strips & [facility]. I called first to make sure the pharmacy had machine.

## 2013-07-02 ENCOUNTER — Other Ambulatory Visit: Payer: Self-pay | Admitting: Family Medicine

## 2013-07-02 IMAGING — CR DG ANKLE COMPLETE 3+V*R*
3 series · 3 of 3 positions shown · non-contrast
Comparison: None

CLINICAL DATA: Fall, right ankle pain.

RIGHT ANKLE - COMPLETE 3+ VIEW

[view not recorded (1 of 3)]
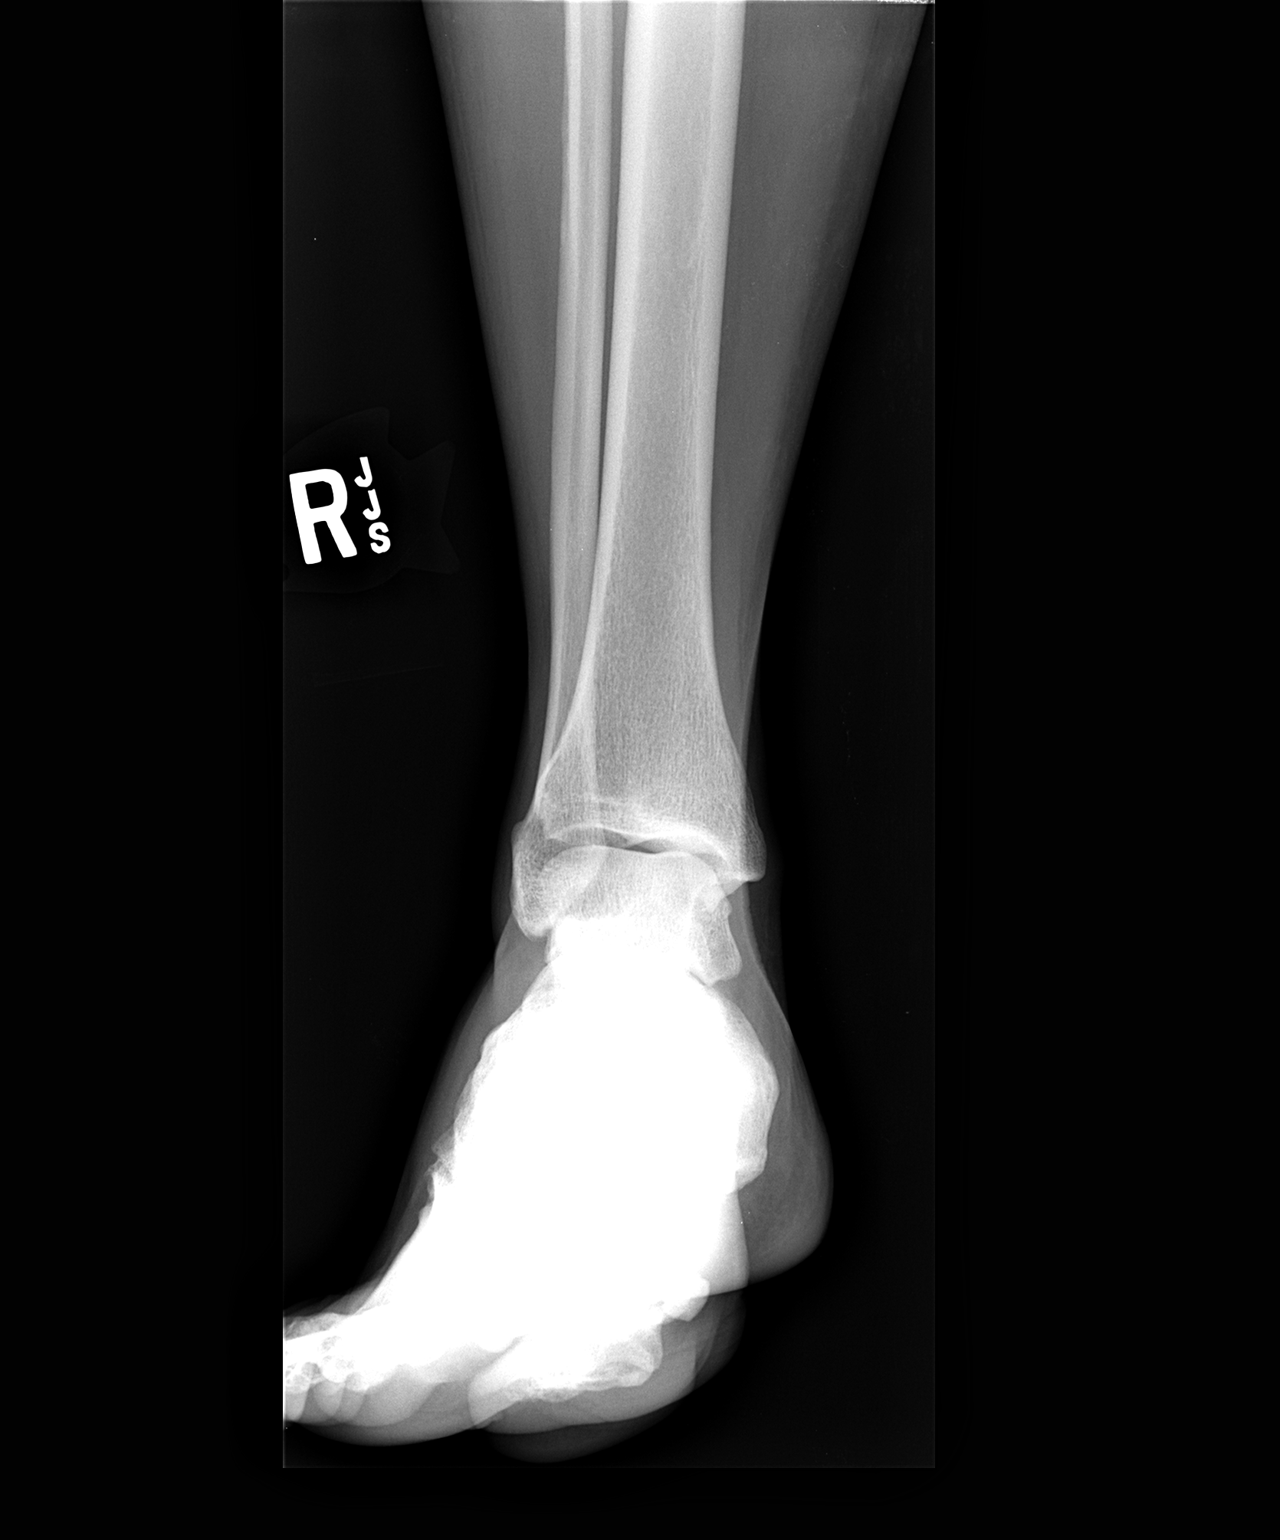

[view not recorded (2 of 3)]
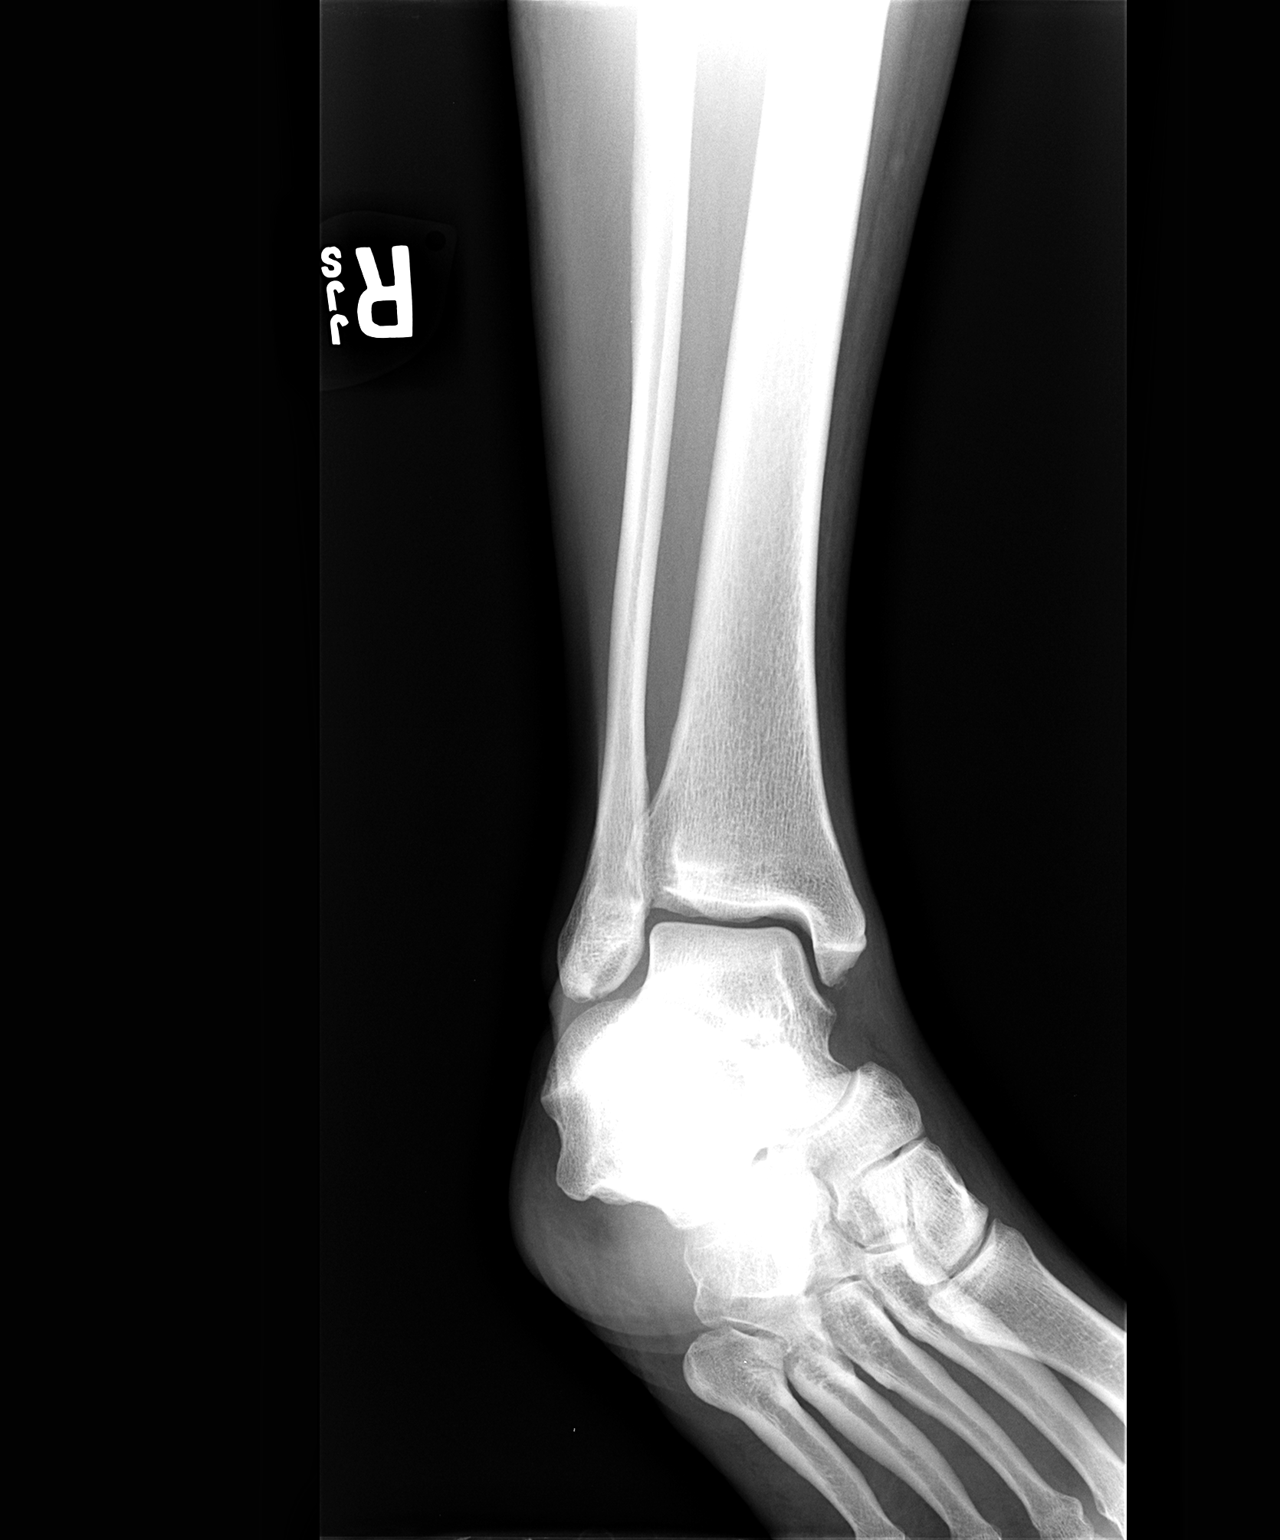

[view not recorded (3 of 3)]
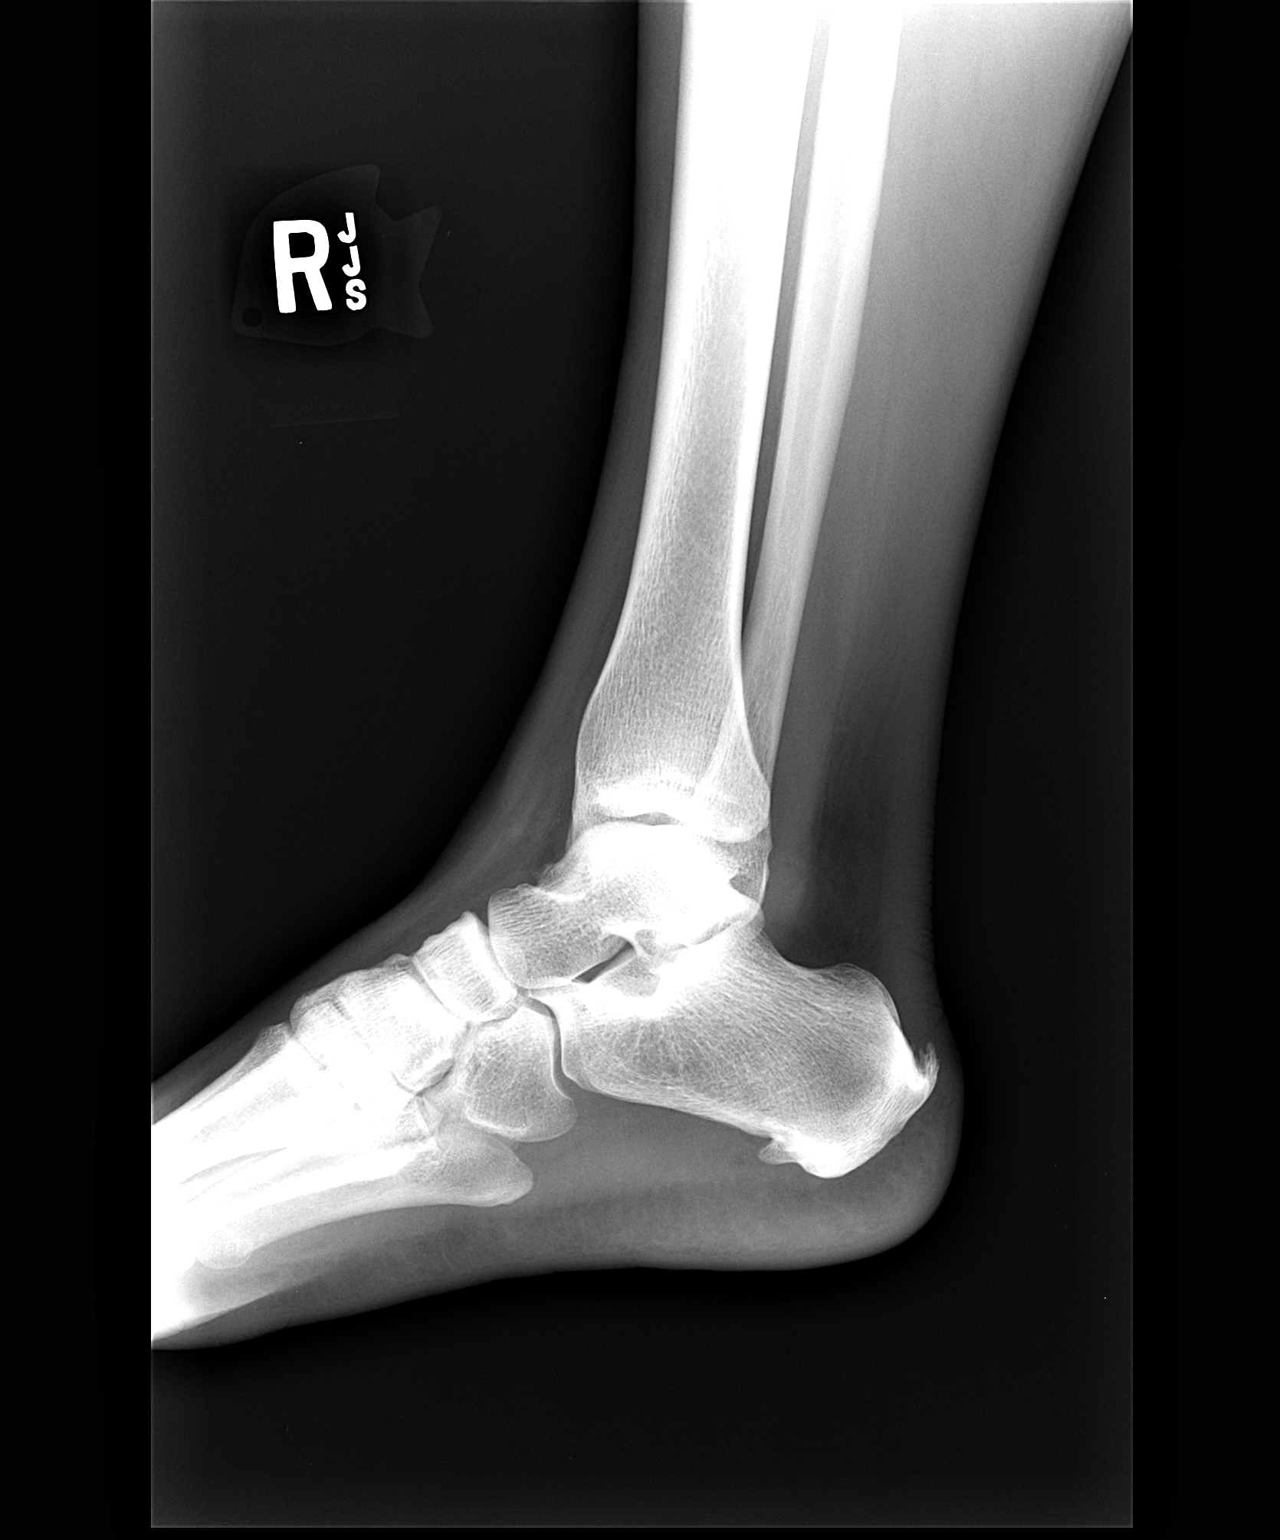

[3 of 3 positions shown; findings below may reference images not displayed]

FINDINGS: No acute bony abnormality.  Specifically, no fracture,
subluxation, or dislocation.  Soft tissues are intact.  Plantar
calcaneal spur.
IMPRESSION: No acute bony abnormality.

## 2013-08-15 ENCOUNTER — Other Ambulatory Visit: Payer: Self-pay | Admitting: Family Medicine

## 2013-08-22 ENCOUNTER — Telehealth: Payer: Self-pay | Admitting: *Deleted

## 2013-08-22 DIAGNOSIS — E119 Type 2 diabetes mellitus without complications: Secondary | ICD-10-CM

## 2013-08-22 DIAGNOSIS — E781 Pure hyperglyceridemia: Secondary | ICD-10-CM

## 2013-08-22 NOTE — Telephone Encounter (Signed)
Left message on machine for patient to schedule a lab appointment to have her cholesterol cheked Lipid panel ordered Message sent to mychart Diabetic bundle

## 2013-09-06 ENCOUNTER — Other Ambulatory Visit (INDEPENDENT_AMBULATORY_CARE_PROVIDER_SITE_OTHER): Payer: BC Managed Care – PPO

## 2013-09-06 DIAGNOSIS — E781 Pure hyperglyceridemia: Secondary | ICD-10-CM

## 2013-09-06 DIAGNOSIS — E119 Type 2 diabetes mellitus without complications: Secondary | ICD-10-CM

## 2013-09-06 LAB — LIPID PANEL
CHOLESTEROL: 205 mg/dL — AB (ref 0–200)
HDL: 45.6 mg/dL (ref >39.00)
NonHDL: 159.4
TRIGLYCERIDES: 356 mg/dL — AB (ref 0.0–149.0)
Total CHOL/HDL Ratio: 4
VLDL: 71.2 mg/dL — AB (ref 0.0–40.0)

## 2013-09-06 LAB — LDL CHOLESTEROL, DIRECT: Direct LDL: 118.4 mg/dL

## 2013-09-21 ENCOUNTER — Ambulatory Visit: Payer: BC Managed Care – PPO | Admitting: Family Medicine

## 2013-09-21 ENCOUNTER — Encounter: Payer: Self-pay | Admitting: Family Medicine

## 2013-09-22 ENCOUNTER — Telehealth: Payer: Self-pay | Admitting: Family Medicine

## 2013-09-22 MED ORDER — HYDROCHLOROTHIAZIDE 25 MG PO TABS: 25.0000 mg | ORAL_TABLET | Freq: Every day | ORAL | Status: DC

## 2013-09-22 NOTE — Telephone Encounter (Signed)
Refill request for Hydrochlorothiazide 25 mg take 1 po qd and [facility]

## 2013-09-22 NOTE — Telephone Encounter (Signed)
I sent script e-scribe. 

## 2013-09-26 ENCOUNTER — Ambulatory Visit: Payer: BC Managed Care – PPO | Admitting: Family Medicine

## 2013-09-27 ENCOUNTER — Ambulatory Visit: Payer: BC Managed Care – PPO | Admitting: Family Medicine

## 2013-09-27 ENCOUNTER — Other Ambulatory Visit: Payer: Self-pay | Admitting: Family Medicine

## 2013-10-05 ENCOUNTER — Encounter: Payer: Self-pay | Admitting: Family Medicine

## 2013-10-05 ENCOUNTER — Ambulatory Visit (INDEPENDENT_AMBULATORY_CARE_PROVIDER_SITE_OTHER): Payer: BC Managed Care – PPO | Admitting: Family Medicine

## 2013-10-05 VITALS — BP 144/84 | HR 74 | Temp 98.5°F | Ht 63.0 in | Wt 221.0 lb

## 2013-10-05 DIAGNOSIS — M542 Cervicalgia: Secondary | ICD-10-CM

## 2013-10-05 DIAGNOSIS — G609 Hereditary and idiopathic neuropathy, unspecified: Secondary | ICD-10-CM

## 2013-10-05 DIAGNOSIS — E119 Type 2 diabetes mellitus without complications: Secondary | ICD-10-CM

## 2013-10-05 DIAGNOSIS — M545 Low back pain, unspecified: Secondary | ICD-10-CM

## 2013-10-05 DIAGNOSIS — Z23 Encounter for immunization: Secondary | ICD-10-CM

## 2013-10-05 LAB — MICROALBUMIN / CREATININE URINE RATIO
Creatinine,U: 101.2 mg/dL
Microalb Creat Ratio: 1.7 mg/g (ref 0.0–30.0)
Microalb, Ur: 1.7 mg/dL (ref 0.0–1.9)

## 2013-10-05 LAB — HEMOGLOBIN A1C: Hgb A1c MFr Bld: 8.2 % — ABNORMAL HIGH (ref 4.6–6.5)

## 2013-10-05 MED ORDER — DICLOFENAC SODIUM 75 MG PO TBEC: 75.0000 mg | DELAYED_RELEASE_TABLET | Freq: Two times a day (BID) | ORAL | Status: DC

## 2013-10-05 NOTE — Progress Notes (Signed)
   Subjective:    Patient ID: Christina [name], female    DOB: 10/02/1965, 48 y.o.   MRN: [id number]  HPI She is doing well in general and her glucoses and BP have been stable at home.    Review of Systems  Constitutional: Negative.   Respiratory: Negative.   Cardiovascular: Negative.        Objective:   Physical Exam  Constitutional: She appears well-developed and well-nourished.  Cardiovascular: Normal rate, regular rhythm, normal heart sounds and intact distal pulses.   Pulmonary/Chest: Effort normal and breath sounds normal.          Assessment & Plan:  Get an A1c today. I suggested she look into Weight Watchers as a way to modify her diet.

## 2013-10-05 NOTE — Addendum Note (Signed)
Addended by: Aggie Hacker A on: 10/05/2013 11:02 AM   Modules accepted: Orders

## 2013-10-05 NOTE — Progress Notes (Signed)
Pre visit review using our clinic review tool, if applicable. No additional management support is needed unless otherwise documented below in the visit note. 

## 2013-10-10 MED ORDER — METFORMIN HCL 1000 MG PO TABS: 1000.0000 mg | ORAL_TABLET | Freq: Two times a day (BID) | ORAL | Status: DC

## 2013-10-10 NOTE — Addendum Note (Signed)
Addended by: Aggie Hacker A on: 10/10/2013 01:59 PM   Modules accepted: Orders

## 2013-10-10 NOTE — Addendum Note (Signed)
Addended by: Aggie Hacker A on: 10/10/2013 02:17 PM   Modules accepted: Orders

## 2013-10-12 MED ORDER — SITAGLIPTIN PHOSPHATE 100 MG PO TABS: 100.0000 mg | ORAL_TABLET | Freq: Every day | ORAL | Status: DC

## 2013-10-12 NOTE — Addendum Note (Signed)
Addended by: Aggie Hacker A on: 10/12/2013 03:35 PM   Modules accepted: Orders

## 2013-10-25 ENCOUNTER — Encounter: Payer: Self-pay | Admitting: Family Medicine

## 2013-10-31 ENCOUNTER — Other Ambulatory Visit: Payer: Self-pay | Admitting: Family Medicine

## 2013-11-08 ENCOUNTER — Telehealth: Payer: Self-pay | Admitting: Family Medicine

## 2013-11-08 NOTE — Telephone Encounter (Signed)
[id number] - [place], [place] - [address] is requesting re-fill on cyanocobalamin (,VITAMIN B-12,) 1000 MCG/ML injection

## 2013-11-09 ENCOUNTER — Other Ambulatory Visit: Payer: Self-pay | Admitting: Family Medicine

## 2013-11-09 DIAGNOSIS — E538 Deficiency of other specified B group vitamins: Secondary | ICD-10-CM

## 2013-11-09 MED ORDER — CYANOCOBALAMIN 1000 MCG/ML IJ SOLN: 1000.0000 ug | INTRAMUSCULAR | Status: DC

## 2013-11-09 NOTE — Telephone Encounter (Signed)
Is pt due for a Vitamin B 12 lab draw?

## 2013-11-09 NOTE — Telephone Encounter (Signed)
I sent script e-scribe, put a future lab order in computer and left a voice message for pt to call and schedule the lab draw soon.

## 2013-11-09 NOTE — Telephone Encounter (Signed)
Refill the B12 for one year, but yes she does need to have a level drawn soon

## 2013-11-14 ENCOUNTER — Other Ambulatory Visit: Payer: Self-pay | Admitting: Family Medicine

## 2013-11-20 ENCOUNTER — Other Ambulatory Visit: Payer: Self-pay | Admitting: Family Medicine

## 2013-11-21 ENCOUNTER — Other Ambulatory Visit: Payer: Self-pay | Admitting: Family Medicine

## 2013-12-12 ENCOUNTER — Other Ambulatory Visit: Payer: Self-pay | Admitting: Family Medicine

## 2013-12-12 NOTE — Telephone Encounter (Signed)
[id number] - [place], [place] - [address] is requesting re-fill on traMADol (ULTRAM) 50 MG tablet

## 2013-12-13 NOTE — Telephone Encounter (Signed)
Call in #120 with 5 rf 

## 2013-12-14 MED ORDER — TRAMADOL HCL 50 MG PO TABS: ORAL_TABLET | ORAL | Status: DC

## 2013-12-14 NOTE — Telephone Encounter (Signed)
Rx sent to pharmacy   

## 2014-01-19 ENCOUNTER — Other Ambulatory Visit: Payer: Self-pay | Admitting: Family Medicine

## 2014-01-24 ENCOUNTER — Other Ambulatory Visit: Payer: Self-pay | Admitting: *Deleted

## 2014-01-24 MED ORDER — POTASSIUM CHLORIDE CRYS ER 10 MEQ PO TBCR: EXTENDED_RELEASE_TABLET | ORAL | Status: DC

## 2014-02-21 ENCOUNTER — Encounter: Payer: Self-pay | Admitting: Family Medicine

## 2014-02-21 ENCOUNTER — Ambulatory Visit (INDEPENDENT_AMBULATORY_CARE_PROVIDER_SITE_OTHER): Payer: 59 | Admitting: Family Medicine

## 2014-02-21 VITALS — BP 148/86 | HR 77 | Temp 98.4°F | Ht 63.0 in | Wt 221.0 lb

## 2014-02-21 DIAGNOSIS — G4733 Obstructive sleep apnea (adult) (pediatric): Secondary | ICD-10-CM

## 2014-02-21 DIAGNOSIS — I10 Essential (primary) hypertension: Secondary | ICD-10-CM

## 2014-02-21 DIAGNOSIS — Z794 Long term (current) use of insulin: Secondary | ICD-10-CM

## 2014-02-21 DIAGNOSIS — E119 Type 2 diabetes mellitus without complications: Secondary | ICD-10-CM

## 2014-02-21 LAB — POCT URINALYSIS DIPSTICK
GLUCOSE UA: NEGATIVE
KETONES UA: NEGATIVE
LEUKOCYTES UA: NEGATIVE
Nitrite, UA: NEGATIVE
PROTEIN UA: NEGATIVE
RBC UA: NEGATIVE
Spec Grav, UA: 1.025
Urobilinogen, UA: 0.2
pH, UA: 5.5

## 2014-02-21 LAB — CBC WITH DIFFERENTIAL/PLATELET
BASOS ABS: 0 10*3/uL (ref 0.0–0.1)
Basophils Relative: 0.4 % (ref 0.0–3.0)
EOS PCT: 1.1 % (ref 0.0–5.0)
Eosinophils Absolute: 0.1 10*3/uL (ref 0.0–0.7)
HCT: 40.1 % (ref 36.0–46.0)
Hemoglobin: 13.8 g/dL (ref 12.0–15.0)
Lymphocytes Relative: 28.3 % (ref 12.0–46.0)
Lymphs Abs: 1.7 10*3/uL (ref 0.7–4.0)
MCHC: 34.4 g/dL (ref 30.0–36.0)
MCV: 84 fl (ref 78.0–100.0)
Monocytes Absolute: 0.2 10*3/uL (ref 0.1–1.0)
Monocytes Relative: 3.8 % (ref 3.0–12.0)
NEUTROS PCT: 66.4 % (ref 43.0–77.0)
Neutro Abs: 4 10*3/uL (ref 1.4–7.7)
Platelets: 172 10*3/uL (ref 150.0–400.0)
RBC: 4.77 Mil/uL (ref 3.87–5.11)
RDW: 13.5 % (ref 11.5–15.5)
WBC: 6.1 10*3/uL (ref 4.0–10.5)

## 2014-02-21 LAB — HEMOGLOBIN A1C: HEMOGLOBIN A1C: 9 % — AB (ref 4.6–6.5)

## 2014-02-21 LAB — TSH: TSH: 4.98 u[IU]/mL — ABNORMAL HIGH (ref 0.35–4.50)

## 2014-02-21 NOTE — Progress Notes (Signed)
   Subjective:    Patient ID: Christina [name], female    DOB: 07/29/1965, 49 y.o.   MRN: [id number]  HPI Here to follow up on issues. Her glucoses have been quite high for the past few months, usually in the high 200s to mid 300s. She is watching her diet but she is off all her meds. She does not tolerate metformin and Januvia is too expensive. She got frustrated and simply stopped taking glipizide. She also describes feeling tired all the time and her husband notes that she often stops breathing in her sleep and she snores a lot.    Review of Systems  Constitutional: Positive for fatigue.  Respiratory: Negative.   Cardiovascular: Negative.        Objective:   Physical Exam  Constitutional: She appears well-developed and well-nourished.  Cardiovascular: Normal rate, regular rhythm, normal heart sounds and intact distal pulses.   Pulmonary/Chest: Effort normal and breath sounds normal.          Assessment & Plan:  She very likely has sleep apnea so we will refer her to be evaluated for this. Her diabetes is out of control, and she would do much better on insulin. We will refer her to Endocrine for this. Get labs today.

## 2014-02-21 NOTE — Progress Notes (Signed)
Pre visit review using our clinic review tool, if applicable. No additional management support is needed unless otherwise documented below in the visit note. 

## 2014-02-22 LAB — HEPATIC FUNCTION PANEL
ALT: 32 U/L (ref 0–35)
AST: 23 U/L (ref 0–37)
Albumin: 4.2 g/dL (ref 3.5–5.2)
Alkaline Phosphatase: 53 U/L (ref 39–117)
BILIRUBIN DIRECT: 0.1 mg/dL (ref 0.0–0.3)
TOTAL PROTEIN: 7.2 g/dL (ref 6.0–8.3)
Total Bilirubin: 0.5 mg/dL (ref 0.2–1.2)

## 2014-02-22 LAB — BASIC METABOLIC PANEL
BUN: 18 mg/dL (ref 6–23)
CHLORIDE: 100 meq/L (ref 96–112)
CO2: 29 mEq/L (ref 19–32)
Calcium: 9.3 mg/dL (ref 8.4–10.5)
Creatinine, Ser: 0.95 mg/dL (ref 0.40–1.20)
GFR: 66.32 mL/min (ref >60.00)
Glucose, Bld: 190 mg/dL — ABNORMAL HIGH (ref 70–99)
Potassium: 4.4 mEq/L (ref 3.5–5.1)
SODIUM: 137 meq/L (ref 135–145)

## 2014-02-22 LAB — LIPID PANEL
CHOL/HDL RATIO: 5
CHOLESTEROL: 224 mg/dL — AB (ref 0–200)
HDL: 49 mg/dL (ref >39.00)
NONHDL: 175
Triglycerides: 328 mg/dL — ABNORMAL HIGH (ref 0.0–149.0)
VLDL: 65.6 mg/dL — ABNORMAL HIGH (ref 0.0–40.0)

## 2014-02-22 LAB — MICROALBUMIN / CREATININE URINE RATIO
CREATININE, U: 177.2 mg/dL
MICROALB/CREAT RATIO: 1.4 mg/g (ref 0.0–30.0)
Microalb, Ur: 2.5 mg/dL — ABNORMAL HIGH (ref 0.0–1.9)

## 2014-02-22 LAB — LDL CHOLESTEROL, DIRECT: LDL DIRECT: 143 mg/dL

## 2014-02-27 MED ORDER — LEVOTHYROXINE SODIUM 50 MCG PO TABS
50.0000 ug | ORAL_TABLET | Freq: Every day | ORAL | Status: DC
Start: 2014-02-27 — End: 2015-02-07

## 2014-02-27 NOTE — Addendum Note (Signed)
Addended by: Aggie Hacker A on: 02/27/2014 12:33 PM   Modules accepted: Orders

## 2014-03-06 ENCOUNTER — Ambulatory Visit (INDEPENDENT_AMBULATORY_CARE_PROVIDER_SITE_OTHER): Payer: 59 | Admitting: Internal Medicine

## 2014-03-06 ENCOUNTER — Encounter: Payer: Self-pay | Admitting: Internal Medicine

## 2014-03-06 VITALS — BP 124/66 | HR 85 | Temp 97.6°F | Resp 14 | Ht 62.5 in | Wt 218.0 lb

## 2014-03-06 DIAGNOSIS — IMO0002 Reserved for concepts with insufficient information to code with codable children: Secondary | ICD-10-CM | POA: Insufficient documentation

## 2014-03-06 DIAGNOSIS — E1165 Type 2 diabetes mellitus with hyperglycemia: Secondary | ICD-10-CM

## 2014-03-06 DIAGNOSIS — E1169 Type 2 diabetes mellitus with other specified complication: Secondary | ICD-10-CM | POA: Insufficient documentation

## 2014-03-06 MED ORDER — GLIPIZIDE 5 MG PO TABS
5.0000 mg | ORAL_TABLET | Freq: Two times a day (BID) | ORAL | Status: DC
Start: 2014-03-06 — End: 2014-04-06

## 2014-03-06 MED ORDER — METFORMIN HCL ER 500 MG PO TB24: 500.0000 mg | ORAL_TABLET | Freq: Two times a day (BID) | ORAL | Status: DC

## 2014-03-06 NOTE — Patient Instructions (Signed)
Please start Metformin XR 500 mg with dinner x 4 days, then add a second dose with b'fast >> continue 500 mg 2x a day with meals. Let me know if I need to call more in.  Also, start Glipizide 5 mg 2x a daily, before b'fast and dinner.  Please let me know if I can refer you to nutrition.   Please return in 1 month with your sugar log.   PATIENT INSTRUCTIONS FOR TYPE 2 DIABETES:  DIET AND EXERCISE Diet and exercise is an important part of diabetic treatment.  We recommended aerobic exercise in the form of brisk walking (working between 40-60% of maximal aerobic capacity, similar to brisk walking) for 150 minutes per week (such as 30 minutes five days per week) along with 3 times per week performing 'resistance' training (using various gauge rubber tubes with handles) 5-10 exercises involving the major muscle groups (upper body, lower body and core) performing 10-15 repetitions (or near fatigue) each exercise. Start at half the above goal but build slowly to reach the above goals. If limited by weight, joint pain, or disability, we recommend daily walking in a swimming pool with water up to waist to reduce pressure from joints while allow for adequate exercise.    BLOOD GLUCOSES Monitoring your blood glucoses is important for continued management of your diabetes. Please check your blood glucoses 2-4 times a day: fasting, before meals and at bedtime (you can rotate these measurements - e.g. one day check before the 3 meals, the next day check before 2 of the meals and before bedtime, etc.).   HYPOGLYCEMIA (low blood sugar) Hypoglycemia is usually a reaction to not eating, exercising, or taking too much insulin/ other diabetes drugs.  Symptoms include tremors, sweating, hunger, confusion, headache, etc. Treat IMMEDIATELY with 15 grams of Carbs: . 4 glucose tablets .  cup regular juice/soda . 2 tablespoons raisins . 4 teaspoons sugar . 1 tablespoon honey Recheck blood glucose in 15 mins and  repeat above if still symptomatic/blood glucose <100.  RECOMMENDATIONS TO REDUCE YOUR RISK OF DIABETIC COMPLICATIONS: * Take your prescribed MEDICATION(S) * Follow a DIABETIC diet: Complex carbs, fiber rich foods, (monounsaturated and polyunsaturated) fats * AVOID saturated/trans fats, high fat foods, >2,300 mg salt per day. * EXERCISE at least 5 times a week for 30 minutes or preferably daily.  * DO NOT SMOKE OR DRINK more than 1 drink a day. * Check your FEET every day. Do not wear tightfitting shoes. Contact us if you develop an ulcer * See your EYE doctor once a year or more if needed * Get a FLU shot once a year * Get a PNEUMONIA vaccine once before and once after age 51 years  GOALS:  * Your Hemoglobin A1c of <7%  * fasting sugars need to be <130 * after meals sugars need to be <180 (2h after you start eating) * Your Systolic BP should be 710 or lower  * Your Diastolic BP should be 80 or lower  * Your HDL (Good Cholesterol) should be 40 or higher  * Your LDL (Bad Cholesterol) should be 100 or lower. * Your Triglycerides should be 150 or lower  * Your Urine microalbumin (kidney function) should be <30 * Your Body Mass Index should be 25 or lower    Please consider the following ways to cut down carbs and fat and increase fiber and micronutrients in your diet: - substitute whole grain for white bread or pasta - substitute brown rice for white  rice - substitute 90-calorie flat bread pieces for slices of bread when possible - substitute sweet potatoes or yams for white potatoes - substitute humus for margarine - substitute tofu for cheese when possible - substitute almond or rice milk for regular milk (would not drink soy milk daily due to concern for soy estrogen influence on breast cancer risk) - substitute dark chocolate for other sweets when possible - substitute water - can add lemon or orange slices for taste - for diet sodas (artificial sweeteners will trick your body that  you can eat sweets without getting calories and will lead you to overeating and weight gain in the long run) - do not skip breakfast or other meals (this will slow down the metabolism and will result in more weight gain over time)  - can try smoothies made from fruit and almond/rice milk in am instead of regular breakfast - can also try old-fashioned (not instant) oatmeal made with almond/rice milk in am - order the dressing on the side when eating salad at a restaurant (pour less than half of the dressing on the salad) - eat as little meat as possible - can try juicing, but should not forget that juicing will get rid of the fiber, so would alternate with eating raw veg./fruits or drinking smoothies - use as little oil as possible, even when using olive oil - can dress a salad with a mix of balsamic vinegar and lemon juice, for e.g. - use agave nectar, stevia sugar, or regular sugar rather than artificial sweateners - steam or broil/roast veggies  - snack on veggies/fruit/nuts (unsalted, preferably) when possible, rather than processed foods - reduce or eliminate aspartame in diet (it is in diet sodas, chewing gum, etc) Read the labels!  Try to read Dr. Janene Harvey book: "Program for Reversing Diabetes" for other ideas for healthy eating.

## 2014-03-06 NOTE — Progress Notes (Signed)
Patient ID: Christina [name], female   DOB: 12/30/1965, 49 y.o.   MRN: [id number]  HPI: Christina [name] is a 49 y.o.-year-old female, referred by her PCP, Dr.Fry, for management of DM2, dx in 2011, initially GDM in 1987 and 1992, non-insulin-dependent, uncontrolled, without complications.  Last hemoglobin A1c was: Lab Results  Component Value Date   HGBA1C 9.0* 02/21/2014   HGBA1C 8.2* 10/05/2013   HGBA1C 6.5 02/18/2013   Pt is not on any diabetic meds. She was on Januvia 100 mg in am >> could not afford ([employer]) Now she has [facility]. She was on metformin >> nausea.  Pt checks her sugars 1-2x a day and they are: - am: 249 - 2h after b'fast: 168 - before lunch: 218-278 - 2h after lunch: 187-232 - before dinner: 231 - 2h after dinner: n/c - bedtime: n/c - nighttime: n/c No lows. Lowest sugar was 168; she has hypoglycemia awareness at 70.  Highest sugar was 400s.  Glucometer: True test  Pt's meals are: - Breakfast: pancakes; eggs; toast; sometimes fruit - Lunch: PB sandwich - Dinner: steak + gravy + mashed potatoes; biscuit; veggies - Snacks: 1-2: Glucerna bar and crackers Cut down sodas >> now driking water.  - no CKD, last BUN/creatinine:  Lab Results  Component Value Date   BUN 18 02/21/2014   CREATININE 0.95 02/21/2014   - last set of lipids: Lab Results  Component Value Date   CHOL 224* 02/21/2014   HDL 49.00 02/21/2014   LDLCALC 103* 12/30/2005   LDLDIRECT 143.0 02/21/2014   TRIG 328.0* 02/21/2014   CHOLHDL 5 02/21/2014  Allergy to ACEI >> face swelling. - last eye exam was in 03/2012. No DR.  - + numbness and tingling in her feet. On Neurontin  - also low back pain.  Pt has no FH of DM.  ROS: Constitutional: + weight gain, + fatigue, + hot flushes, + poor sleep, + nocturia Eyes: + blurry vision, no xerophthalmia ENT: no sore throat, no nodules palpated in throat, no dysphagia/odynophagia, + hoarseness, + tinnitus, + hypoacusis Cardiovascular: + CP/+ SOB/+  palpitations/+ leg swelling Respiratory: + cough/+ SOB/+ wheezing Gastrointestinal: + N/+ V/+ D/+ C, + heartburn Musculoskeletal: + muscle aches/+ joint aches Skin: no rashes, + itching, + easy bruising, + hair loss Neurological: no tremors/numbness/tingling/dizziness, + HA Psychiatric: + both: depression/anxiety   Past Medical History  Diagnosis Date  . Allergy   . Asthma   . GERD (gastroesophageal reflux disease)   . Hypertension   . Meningitis   . Hx of colonic polyps   . Low back pain   . Interstitial cystitis     per Dr. Risa Grill  . Diabetes mellitus   . Angioedema   . Eczema    Past Surgical History  Procedure Laterality Date  . Abdominal hysterectomy    . Tubal ligation      bilateral  . Brain tumor excision      benign 1984  . Tonsillectomy    . Cystic mass  4.4.11    remval from abdomen per Dr. Kendell Bane at [facility]  . Svd x 2     History   Social History  . Marital Status: Married    Spouse Name: N/A    Number of Children: 2   Occupational History  .    Social History Main Topics  . Smoking status: Former Smoker, quit 2009  . Smokeless tobacco: Never Used  . Alcohol Use: No  . Drug Use: No  Current Outpatient Prescriptions on File Prior to Visit  Medication Sig Dispense Refill  . aspirin 81 MG EC tablet Take 81 mg by mouth daily.      . B-D 3CC LUER-LOK SYR 25GX1/2" 25G X 1-1/2" 3 ML MISC USE AS DIRECTED 100 each 0  . Blood Glucose Monitoring Suppl (ONETOUCH VERIO) W/DEVICE KIT 1 each by Does not apply route daily. 1 kit 0  . [facility] ULTRA THIN LANCETS MISC 1 strip by Does not apply route as directed. 200 each 11  . cyanocobalamin (,VITAMIN B-12,) 1000 MCG/ML injection Inject 1 mL (1,000 mcg total) into the muscle once a week. 10 mL 6  . cyclobenzaprine (FLEXERIL) 10 MG tablet TAKE 1 TABLET (10 MG TOTAL) BY MOUTH 3 (THREE) TIMES DAILY AS NEEDED FOR MUSCLE SPASMS. 90 tablet 6  . desonide (DESOWEN) 0.05 % cream Apply topically 2 (two) times  daily.      . diclofenac (VOLTAREN) 75 MG EC tablet Take 1 tablet (75 mg total) by mouth 2 (two) times daily. 60 tablet 5  . diltiazem (CARDIZEM CD) 240 MG 24 hr capsule TAKE ONE CAPSULE BY MOUTH EVERY DAY 30 capsule 3  . diphenhydrAMINE (BENADRYL) 25 mg capsule Take 25 mg by mouth as needed.      . fenofibrate 160 MG tablet TAKE 1 TABLET BY MOUTH EVERY DAY 30 tablet 11  . gabapentin (NEURONTIN) 300 MG capsule TAKE 2 CAPSULES (600 MG TOTAL) BY MOUTH 3 (THREE) TIMES DAILY. 180 capsule 1  . glucose blood (ONETOUCH VERIO) test strip Test once per day and diagnosis code is 250.00 100 each 1  . glucose blood (TRUETEST TEST) test strip Use as instructed 100 each 1  . hydrochlorothiazide (HYDRODIURIL) 25 MG tablet Take 1 tablet (25 mg total) by mouth daily. 30 tablet 6  . HYDROmorphone (DILAUDID) 4 MG tablet Take 1 tablet (4 mg total) by mouth every 6 (six) hours as needed. 60 tablet 0  . ketoconazole (NIZORAL) 2 % shampoo Apply topically 2 (two) times a week. 120 mL 11  . Lancets MISC Test once per day and diagnosis code is 250.00 100 each 1  . levothyroxine (SYNTHROID, LEVOTHROID) 50 MCG tablet Take 1 tablet (50 mcg total) by mouth daily. 30 tablet 11  . mupirocin ointment (BACTROBAN) 2 % Apply topically 3 (three) times daily. 30 g 5  . omeprazole (PRILOSEC) 20 MG capsule Take 1 capsule (20 mg total) by mouth 2 (two) times daily. 60 capsule 11  . omeprazole (PRILOSEC) 20 MG capsule TAKE ONE CAPSULE 2 TIMES A DAY 60 capsule 6  . phentermine 37.5 MG capsule Take 1 capsule (37.5 mg total) by mouth every morning. 30 capsule 5  . potassium chloride (KLOR-CON M10) 10 MEQ tablet TAKE 1 TABLET (10 MEQ TOTAL) BY MOUTH DAILY. 90 tablet 3  . promethazine (PHENERGAN) 25 MG tablet Take 1 tablet (25 mg total) by mouth every 6 (six) hours as needed for nausea. 60 tablet 5  . sertraline (ZOLOFT) 100 MG tablet TAKE 1 TABLET (100 MG TOTAL) BY MOUTH DAILY. 90 tablet 3  . sitaGLIPtin (JANUVIA) 100 MG tablet Take 1 tablet  (100 mg total) by mouth daily. 30 tablet 11  . sulfamethoxazole-trimethoprim (BACTRIM DS) 800-160 MG per tablet TAKE 1 TABLET BY MOUTH TWICE A DAY 60 tablet 11  . SYRINGE-NEEDLE, DISP, 3 ML (MONOJECT 3CC SYR 25GX1-1/4") 25G X 1-1/4" 3 ML MISC by Does not apply route as directed.      . traMADol (ULTRAM) 50 MG tablet  TAKE 2 TABLETS TWICE A DAY 120 tablet 5  . [DISCONTINUED] potassium chloride (KLOR-CON 10) 10 MEQ CR tablet Take 1 tablet (10 mEq total) by mouth daily. 90 tablet 3   No current facility-administered medications on file prior to visit.   Allergies  Allergen Reactions  . Albuterol   . Clindamycin/Lincomycin   . Codeine   . Doxycycline   . Lisinopril     REACTION: facial swelling  . Metformin And Related     Vomiting  . Metronidazole     REACTION: rash  . Ondansetron Hcl     REACTION: rash  . Oxycodone-Acetaminophen   . Tylox [Oxycodone-Acetaminophen]   . Vancomycin     REACTION: rash   Family History  Problem Relation Age of Onset  . Hypertension      family hx  . Stroke Mother   . Stroke Father   . Rheum arthritis Mother    PE: BP 124/66 mmHg  Pulse 85  Temp(Src) 97.6 F (36.4 C) (Oral)  Resp 14  Ht 5' 2.5" (1.588 m)  Wt 218 lb (98.884 kg)  BMI 39.21 kg/m2  SpO2 95% Wt Readings from Last 3 Encounters:  03/06/14 218 lb (98.884 kg)  02/21/14 221 lb (100.245 kg)  10/05/13 221 lb (100.245 kg)   Constitutional: obese - truncal, in NAD Eyes: PERRLA, EOMI, no exophthalmos ENT: moist mucous membranes, no thyromegaly, no cervical lymphadenopathy Cardiovascular: RRR, No MRG Respiratory: CTA B Gastrointestinal: abdomen soft, NT, ND, BS+ Musculoskeletal: no deformities, strength intact in all 4 Skin: moist, warm, no rashes Neurological: no tremor with outstretched hands, DTR normal in all 4  ASSESSMENT: 1. DM2, non-insulin-dependent, uncontrolled, without complications  PLAN:  1. Patient with long-standing, uncontrolled diabetes, not on antidiabetic  regimen yet. We will retry Metformin again, in the XR form and will add Glipizide. We are limited by cost issues. - We discussed about options for treatment, and I suggested to:  Patient Instructions  Please start Metformin XR 500 mg with dinner x 4 days, then add a second dose with b'fast >> continue 500 mg 2x a day with meals. Let me know if I need to call more in.  Also, start Glipizide 5 mg 2x a daily, before b'fast and dinner.  Please let me know if I can refer you to nutrition.   Please return in 1 month with your sugar log.   - Strongly advised her to start checking sugars at different times of the day - check 2 times a day, rotating checks - given sugar log and advised how to fill it and to bring it at next appt  - given foot care handout and explained the principles  - given instructions for hypoglycemia management "15-15 rule"  - advised for yearly eye exams >> she is UTD - Return to clinic in 1 mo with sugar log

## 2014-03-10 ENCOUNTER — Other Ambulatory Visit: Payer: Self-pay | Admitting: Family Medicine

## 2014-03-21 ENCOUNTER — Other Ambulatory Visit: Payer: Self-pay | Admitting: Family Medicine

## 2014-03-27 ENCOUNTER — Other Ambulatory Visit: Payer: Self-pay | Admitting: Family Medicine

## 2014-03-28 ENCOUNTER — Telehealth: Payer: Self-pay | Admitting: Family Medicine

## 2014-03-28 ENCOUNTER — Other Ambulatory Visit: Payer: Self-pay | Admitting: Family Medicine

## 2014-03-28 NOTE — Telephone Encounter (Signed)
Pt has fup next week but states she also would like labs prior to appt: B12,  a1c and thyroid. Is that ok?

## 2014-03-29 NOTE — Telephone Encounter (Signed)
It is too soon to check the TSH and A1c but she already has a future order in place for the B12, so she could check the B12

## 2014-03-30 ENCOUNTER — Other Ambulatory Visit: Payer: Self-pay | Admitting: Family Medicine

## 2014-03-30 NOTE — Telephone Encounter (Signed)
I spoke with pt and went over the below information. 

## 2014-04-05 ENCOUNTER — Encounter: Payer: Self-pay | Admitting: Family Medicine

## 2014-04-05 ENCOUNTER — Ambulatory Visit (INDEPENDENT_AMBULATORY_CARE_PROVIDER_SITE_OTHER): Payer: 59 | Admitting: Family Medicine

## 2014-04-05 VITALS — BP 158/60 | HR 104 | Temp 98.1°F | Wt 220.8 lb

## 2014-04-05 DIAGNOSIS — IMO0002 Reserved for concepts with insufficient information to code with codable children: Secondary | ICD-10-CM

## 2014-04-05 DIAGNOSIS — I10 Essential (primary) hypertension: Secondary | ICD-10-CM

## 2014-04-05 DIAGNOSIS — E039 Hypothyroidism, unspecified: Secondary | ICD-10-CM | POA: Insufficient documentation

## 2014-04-05 DIAGNOSIS — M5441 Lumbago with sciatica, right side: Secondary | ICD-10-CM

## 2014-04-05 DIAGNOSIS — M5442 Lumbago with sciatica, left side: Secondary | ICD-10-CM

## 2014-04-05 DIAGNOSIS — E1165 Type 2 diabetes mellitus with hyperglycemia: Secondary | ICD-10-CM

## 2014-04-05 DIAGNOSIS — J4540 Moderate persistent asthma, uncomplicated: Secondary | ICD-10-CM

## 2014-04-05 DIAGNOSIS — F411 Generalized anxiety disorder: Secondary | ICD-10-CM

## 2014-04-05 DIAGNOSIS — E038 Other specified hypothyroidism: Secondary | ICD-10-CM

## 2014-04-05 MED ORDER — HYDROMORPHONE HCL 4 MG PO TABS: 4.0000 mg | ORAL_TABLET | Freq: Four times a day (QID) | ORAL | Status: DC | PRN

## 2014-04-05 NOTE — Progress Notes (Signed)
   Subjective:    Patient ID: Christina [name], female    DOB: 25-Apr-1965, 49 y.o.   MRN: [id number]  HPI Here to follow up. She feels about the same, but she is trying to walk a little more than before to get exercise. She saw Dr. Cruzita Lederer and she was put back on Metformin. We started her on Synthroid about 6 weeks ago.    Review of Systems  Constitutional: Positive for fatigue.  Respiratory: Negative.   Musculoskeletal: Positive for back pain.       Objective:   Physical Exam  Constitutional: She appears well-developed and well-nourished.  Walks with a cane   Cardiovascular: Normal rate, regular rhythm, normal heart sounds and intact distal pulses.   Pulmonary/Chest: Effort normal and breath sounds normal.          Assessment & Plan:  Refilled her Dilaudid. We will check another TSH in about 6 weeks. She will follow up with Dr. Cruzita Lederer.

## 2014-04-05 NOTE — Progress Notes (Signed)
Pre visit review using our clinic review tool, if applicable. No additional management support is needed unless otherwise documented below in the visit note. 

## 2014-04-06 ENCOUNTER — Encounter: Payer: Self-pay | Admitting: Internal Medicine

## 2014-04-06 ENCOUNTER — Ambulatory Visit (INDEPENDENT_AMBULATORY_CARE_PROVIDER_SITE_OTHER): Payer: 59 | Admitting: Internal Medicine

## 2014-04-06 VITALS — BP 152/82 | HR 81 | Temp 97.6°F | Ht 62.5 in | Wt 226.2 lb

## 2014-04-06 DIAGNOSIS — E1165 Type 2 diabetes mellitus with hyperglycemia: Secondary | ICD-10-CM

## 2014-04-06 DIAGNOSIS — IMO0002 Reserved for concepts with insufficient information to code with codable children: Secondary | ICD-10-CM

## 2014-04-06 MED ORDER — GLIPIZIDE 5 MG PO TABS: 5.0000 mg | ORAL_TABLET | Freq: Two times a day (BID) | ORAL | Status: DC

## 2014-04-06 MED ORDER — METFORMIN HCL ER 500 MG PO TB24: 500.0000 mg | ORAL_TABLET | Freq: Two times a day (BID) | ORAL | Status: DC

## 2014-04-06 NOTE — Progress Notes (Signed)
Patient ID: Christina [name], female   DOB: 1965-03-14, 49 y.o.   MRN: [id number]  HPI: Christina [name] is a 49 y.o.-year-old female, returning for f/u for DM2, dx in 2011, initially GDM in 1987 and 1992, non-insulin-dependent, uncontrolled, without complications. Last visit 1 mo ago. Sh is here with her husband who offers part of the hx.  Last hemoglobin A1c was: Lab Results  Component Value Date   HGBA1C 9.0* 02/21/2014   HGBA1C 8.2* 10/05/2013   HGBA1C 6.5 02/18/2013   Pt is on: - Metformin XR 1000 mg 2x a day - tolerates it well but may forget the evening dose - Glipizide 5 mg 2x a day She was on Januvia 100 mg in am >> could not afford ([employer]) Now she has [facility]. She was on metformin >> nausea.  Pt checks her sugars 2-3x a day and they are MUCH improved: - am: 249 >> 119-170 - 2h after b'fast: 168 >> 220 - before lunch: 218-278 >> 98-163 - 2h after lunch: 187-232 >> 92-147, 170 - before dinner: 231 >> 69, 94-139 - 2h after dinner: n/c >> 101-175 - bedtime: n/c >> 84-160, 175 - nighttime: n/c No lows. Lowest sugar was 168 >> 69; she has hypoglycemia awareness at 70.  Highest sugar was 400s >> 220  Glucometer: True test  Pt's meals are: - Breakfast: pancakes; eggs; toast; sometimes fruit - Lunch: PB sandwich - Dinner: steak + gravy + mashed potatoes; biscuit; veggies - Snacks: 1-2: Glucerna bar and crackers Cut down sodas >> now driking water.  - no CKD, last BUN/creatinine:  Lab Results  Component Value Date   BUN 18 02/21/2014   CREATININE 0.95 02/21/2014   - last set of lipids: Lab Results  Component Value Date   CHOL 224* 02/21/2014   HDL 49.00 02/21/2014   LDLCALC 103* 12/30/2005   LDLDIRECT 143.0 02/21/2014   TRIG 328.0* 02/21/2014   CHOLHDL 5 02/21/2014  Allergy to ACEI >> face swelling. - last eye exam was in 03/2012. No DR.  - + numbness and tingling in her feet. On Neurontin  - also low back pain.  ROS: Constitutional: no weight gain/loss, + fatigue, + hot  flushes, + poor sleep, + nocturia Eyes: + blurry vision, no xerophthalmia ENT: no sore throat, no nodules palpated in throat, no dysphagia/odynophagia Cardiovascular: no CP/SOB/palpitations/leg swelling Respiratory: + cough/no SOB/wheezing Gastrointestinal: no N/no V/+ D/+ C, no heartburn Musculoskeletal: + muscle aches/+ joint aches Skin: no rashes, + easy bruising, + hair loss Neurological: no tremors/numbness/tingling/dizziness, + HA  I reviewed pt's medications, allergies, PMH, social hx, family hx, and changes were documented in the history of present illness. Otherwise, unchanged from my initial visit note.  Past Medical History  Diagnosis Date  . Allergy   . Asthma   . GERD (gastroesophageal reflux disease)   . Hypertension   . Meningitis   . Hx of colonic polyps   . Low back pain   . Interstitial cystitis     per Dr. Risa Grill  . Diabetes mellitus   . Angioedema   . Eczema    Past Surgical History  Procedure Laterality Date  . Abdominal hysterectomy    . Tubal ligation      bilateral  . Brain tumor excision      benign 1984  . Tonsillectomy    . Cystic mass  4.4.11    remval from abdomen per Dr. Kendell Bane at [facility]  . Svd x 2  History   Social History  . Marital Status: Married    Spouse Name: N/A    Number of Children: 2   Occupational History  .    Social History Main Topics  . Smoking status: Former Smoker, quit 2009  . Smokeless tobacco: Never Used  . Alcohol Use: No  . Drug Use: No   Current Outpatient Prescriptions on File Prior to Visit  Medication Sig Dispense Refill  . aspirin 81 MG EC tablet Take 81 mg by mouth daily.      . Blood Glucose Monitoring Suppl (ONETOUCH VERIO) W/DEVICE KIT 1 each by Does not apply route daily. 1 kit 0  . [facility]S ULTRA THIN LANCETS MISC 1 strip by Does not apply route as directed. 200 each 11  . cyanocobalamin (,VITAMIN B-12,) 1000 MCG/ML injection Inject 1 mL (1,000 mcg total) into the muscle once a  week. 10 mL 6  . cyclobenzaprine (FLEXERIL) 10 MG tablet TAKE 1 TABLET (10 MG TOTAL) BY MOUTH 3 (THREE) TIMES DAILY AS NEEDED FOR MUSCLE SPASMS. 90 tablet 6  . desonide (DESOWEN) 0.05 % cream Apply topically 2 (two) times daily.      . diclofenac (VOLTAREN) 75 MG EC tablet Take 1 tablet (75 mg total) by mouth 2 (two) times daily. 60 tablet 5  . diltiazem (CARDIZEM CD) 240 MG 24 hr capsule TAKE ONE CAPSULE BY MOUTH EVERY DAY 30 capsule 3  . diphenhydrAMINE (BENADRYL) 25 mg capsule Take 25 mg by mouth as needed.      . fenofibrate 160 MG tablet TAKE 1 TABLET BY MOUTH EVERY DAY 30 tablet 11  . gabapentin (NEURONTIN) 300 MG capsule TAKE 2 CAPSULES (600 MG TOTAL) BY MOUTH 3 (THREE) TIMES DAILY. 180 capsule 1  . glipiZIDE (GLUCOTROL) 5 MG tablet Take 1 tablet (5 mg total) by mouth 2 (two) times daily before a meal. 60 tablet 2  . glucose blood (ONETOUCH VERIO) test strip Test once per day and diagnosis code is 250.00 100 each 1  . glucose blood (TRUETEST TEST) test strip Use as instructed 100 each 1  . hydrochlorothiazide (HYDRODIURIL) 25 MG tablet Take 1 tablet (25 mg total) by mouth daily. 30 tablet 6  . HYDROmorphone (DILAUDID) 4 MG tablet Take 1 tablet (4 mg total) by mouth every 6 (six) hours as needed for severe pain. 120 tablet 0  . ketoconazole (NIZORAL) 2 % shampoo Apply topically 2 (two) times a week. 120 mL 11  . levothyroxine (SYNTHROID, LEVOTHROID) 50 MCG tablet Take 1 tablet (50 mcg total) by mouth daily. 30 tablet 11  . metFORMIN (GLUCOPHAGE-XR) 500 MG 24 hr tablet Take 1 tablet (500 mg total) by mouth 2 (two) times daily with a meal. 14 tablet 2  . mupirocin ointment (BACTROBAN) 2 % Apply topically 3 (three) times daily. 30 g 5  . omeprazole (PRILOSEC) 20 MG capsule Take 1 capsule (20 mg total) by mouth 2 (two) times daily. 60 capsule 11  . potassium chloride (KLOR-CON M10) 10 MEQ tablet TAKE 1 TABLET (10 MEQ TOTAL) BY MOUTH DAILY. 90 tablet 3  . promethazine (PHENERGAN) 25 MG tablet  Take 1 tablet (25 mg total) by mouth every 6 (six) hours as needed for nausea. 60 tablet 5  . sertraline (ZOLOFT) 100 MG tablet TAKE 1 TABLET (100 MG TOTAL) BY MOUTH DAILY. 90 tablet 3  . sulfamethoxazole-trimethoprim (BACTRIM DS,SEPTRA DS) 800-160 MG per tablet TAKE 1 TABLET BY MOUTH TWICE A DAY 60 tablet 11  . SYRINGE-NEEDLE, DISP, 3 ML (  MONOJECT 3CC SYR 25GX1-1/4") 25G X 1-1/4" 3 ML MISC by Does not apply route as directed.      . traMADol (ULTRAM) 50 MG tablet TAKE 2 TABLETS TWICE A DAY 120 tablet 5  . phentermine 37.5 MG capsule Take 1 capsule (37.5 mg total) by mouth every morning. (Patient not taking: Reported on 04/06/2014) 30 capsule 5  . [DISCONTINUED] potassium chloride (KLOR-CON 10) 10 MEQ CR tablet Take 1 tablet (10 mEq total) by mouth daily. 90 tablet 3   No current facility-administered medications on file prior to visit.   Allergies  Allergen Reactions  . Albuterol   . Clindamycin/Lincomycin   . Codeine   . Doxycycline   . Lisinopril     REACTION: facial swelling  . Metformin And Related     Vomiting  . Metronidazole     REACTION: rash  . Ondansetron Hcl     REACTION: rash  . Oxycodone-Acetaminophen   . Tylox [Oxycodone-Acetaminophen]   . Vancomycin     REACTION: rash   Family History  Problem Relation Age of Onset  . Hypertension      family hx  . Stroke Mother   . Stroke Father   . Rheum arthritis Mother    PE: BP 152/82 mmHg  Pulse 81  Temp(Src) 97.6 F (36.4 C) (Oral)  Ht 5' 2.5" (1.588 m)  Wt 226 lb 4 oz (102.626 kg)  BMI 40.70 kg/m2  SpO2 96% Wt Readings from Last 3 Encounters:  04/06/14 226 lb 4 oz (102.626 kg)  04/05/14 220 lb 12.8 oz (100.154 kg)  03/06/14 218 lb (98.884 kg)   Constitutional: obese - truncal, in NAD Eyes: PERRLA, EOMI, no exophthalmos ENT: moist mucous membranes, no thyromegaly, no cervical lymphadenopathy Cardiovascular: RRR, No MRG Respiratory: CTA B Gastrointestinal: abdomen soft, NT, ND, BS+ Musculoskeletal: no  deformities, strength intact in all 4 Skin: moist, warm, no rashes Neurological: no tremor with outstretched hands, DTR normal in all 4  ASSESSMENT: 1. DM2, non-insulin-dependent, uncontrolled, without complications  PLAN:  1. Patient with long-standing, uncontrolled diabetes, now much improved after starting Metformin XR form and Glipizide. Sugars can even be lower later in the day and at bedtime >> will reduce the pm Glipizide. - We are limited by cost issues. -  I suggested to:  Patient Instructions  Please schedule an appt with Mickel Baas for nutrition advice.  Please continue Metformin XR 500 mg 2x a day with meals.   Continue Glipizide 5 mg in am, before b'fast, but only take it before dinner if you have a larger dinner.  Please return in 1.5 month with your sugar log.   - continue checking sugars at different times of the day - check 2 times a day, rotating checks - advised for yearly eye exams >> needs one - will refer her to nutrition - Return to clinic in 1.5 mo with sugar log

## 2014-04-06 NOTE — Progress Notes (Signed)
Pre visit review using our clinic review tool, if applicable. No additional management support is needed unless otherwise documented below in the visit note. 

## 2014-04-06 NOTE — Patient Instructions (Addendum)
Please schedule an appt with Christina [name] for nutrition advice.  Please continue Metformin XR 500 mg 2x a day with meals.   Continue Glipizide 5 mg in am, before b'fast, but only take it before dinner if you have a larger dinner.  Please return in 1.5 month with your sugar log.

## 2014-04-10 ENCOUNTER — Other Ambulatory Visit: Payer: Self-pay | Admitting: Family Medicine

## 2014-04-18 ENCOUNTER — Institutional Professional Consult (permissible substitution): Payer: 59 | Admitting: Pulmonary Disease

## 2014-05-04 ENCOUNTER — Encounter: Payer: 59 | Admitting: Dietician

## 2014-05-20 ENCOUNTER — Other Ambulatory Visit: Payer: Self-pay | Admitting: Family Medicine

## 2014-05-25 ENCOUNTER — Encounter: Payer: 59 | Attending: Internal Medicine | Admitting: Dietician

## 2014-05-25 ENCOUNTER — Encounter: Payer: Self-pay | Admitting: Dietician

## 2014-05-25 VITALS — Ht 62.5 in | Wt 218.0 lb

## 2014-05-25 DIAGNOSIS — E1165 Type 2 diabetes mellitus with hyperglycemia: Secondary | ICD-10-CM

## 2014-05-25 DIAGNOSIS — E118 Type 2 diabetes mellitus with unspecified complications: Secondary | ICD-10-CM | POA: Diagnosis present

## 2014-05-25 DIAGNOSIS — IMO0002 Reserved for concepts with insufficient information to code with codable children: Secondary | ICD-10-CM

## 2014-05-25 DIAGNOSIS — Z713 Dietary counseling and surveillance: Secondary | ICD-10-CM | POA: Insufficient documentation

## 2014-05-25 NOTE — Progress Notes (Signed)
Medical Nutrition Therapy:  Appt start time: 1761 end time:  1430.   Assessment:  Primary concerns today: Patient is here with her with her husband.  She wishes to learn how to control her blood sugar. Patient checks her blood sugar once daily before breakfast with readings 81-150.  She occasionally will check twice a day but cannot afford strips.  Hx of type 2 diabetes since 2011.  GDM [place].  She gets hypoglycemia 1-2 times per month (as low as 51).  HgbA1C:  9% 02/21/14 which was increased from 8.2% 10/06/14.  Hx also includes benign Brain Tumor 1984 and mass removed from abdomen 04/30/09.  Lipids elevated and noted.  Patient lives with husband.  Husband does the shopping and cooking.  She is in a wheelchair today due to degenerative disk disease.  She can walk short distances. Husband is currently on workman"s comp and may lose job shortly.  Patient drinks a lot of sugar sweetened beverages which she is hesitant to give up.  Dislikes many vegetables and like higher fat/seasoned foods.  Approaches life with sense of humor but may not want to make changes.  Preferred Learning Style:   No preference indicated   Learning Readiness:   Contemplating  Ready  MEDICATIONS: Include Glucotrol and Glucophage   DIETARY INTAKE: Refuses whole wheat bread.  Does not like a lot of vegetables.  Eats out more than cooking at home.  (Steakhouses, cafeteria, subs).  Husband cooks.  Lucendia Herrlich at times.    24-hr recall:  B (10-11 AM): 3 packets instant oatmeal  Snk ( AM):   L ( PM): peanut butter and jelly sandwich on white wheat or SKIPS Snk ( PM):  D ( PM): steak and sweet potato or hamburgers or hot dogs with baked fries or tater tots, occasional salad with [place] or vegetable Snk ( PM): peanut butter and jelly sandwich if did not have one for lunch Beverages: flavored water, diet soda, sweet tea or regular coke (often when feels blood sugar is low) when out to eat  Usual physical activity:  ADL's only.  Patient is considering pool aerobics.    Estimated energy needs: 1400 calories 158 g carbohydrates 88 g protein 47 g fat  Progress Towards Goal(s):  In progress.   Nutritional Diagnosis:  NB-1.1 Food and nutrition-related knowledge deficit As related to balance of carbohydrate, protein, and fat.  As evidenced by diet hx.    Intervention:  Nutrition counseling and diabetes education initiated. Discussed Carb Counting by food group as method of portion control, reading food labels, and benefits of increased activity. Also discussed basic physiology of Diabetes, target BG ranges pre and post meals, and A1c.  Discussed changing breakfast to reduce carbohydrate intake and include a protein.  (cook oats in microwave rather than instant packet or use only 1 packet and have toast and egg or peanut butter.    Remember the Rule of 15 for treatment of low blood sugar.    -If blood sugar is confirmed low then drink or eat 15 grams of carbs (1/2 cup regular soda, 1/2 cup juice, 3-4 glucose tabs, or 2 tablespoons raisin).  Recheck blood sugar.  If still low, repeat above.  When normal, eat a meal or protein. Do not over treat low blood sugar. Rethink what you drink.  Avoid drinking sugar. Try to eat regularly scheduled meals to avoid the low sugar and prevent late night eating. Be mindful of portion sizes and appetite.   For snacks,  ask am I hungry or am I eating for another reason? Protein in moderation with meals and snacks Aim for 3 Carb Choices per meal (45 grams) +/- 1 either way  Aim for 0-2 Carbs per snack if hungry  Bake, broil or grill rather than fry.    Teaching Method Utilized:  Visual Auditory Hands on  Handouts given during visit include:  My plate placemat  Label reading  Snack list  Meal plan card  Support group  Barriers to learning/adherence to lifestyle change: none  Demonstrated degree of understanding via:  Teach Back   Monitoring/Evaluation:   Dietary intake, exercise, label reading, and body weight in 1 month(s).

## 2014-05-25 NOTE — Patient Instructions (Signed)
Remember the Rule of 15 for treatment of low blood sugar.    -If blood sugar is confirmed low then drink or eat 15 grams of carbs (1/2 cup regular soda, 1/2 cup juice, 3-4 glucose tabs, or 2 tablespoons raisin).  Recheck blood sugar.  If still low, repeat above.  When normal, eat a meal or protein. Do not over treat low blood sugar. Rethink what you drink.  Avoid drinking sugar. Try to eat regularly scheduled meals to avoid the low sugar and prevent late night eating. Be mindful of portion sizes and appetite.   For snacks, ask am I hungry or am I eating for another reason? Protein in moderation with meals and snacks Aim for 3 Carb Choices per meal (45 grams) +/- 1 either way  Aim for 0-2 Carbs per snack if hungry  Bake, broil or grill rather than fry.

## 2014-05-29 ENCOUNTER — Ambulatory Visit (INDEPENDENT_AMBULATORY_CARE_PROVIDER_SITE_OTHER): Payer: 59 | Admitting: Family Medicine

## 2014-05-29 ENCOUNTER — Encounter: Payer: Self-pay | Admitting: Family Medicine

## 2014-05-29 VITALS — BP 152/102 | HR 82 | Temp 98.5°F | Ht 62.5 in | Wt 221.0 lb

## 2014-05-29 DIAGNOSIS — I10 Essential (primary) hypertension: Secondary | ICD-10-CM | POA: Diagnosis not present

## 2014-05-29 DIAGNOSIS — E038 Other specified hypothyroidism: Secondary | ICD-10-CM | POA: Diagnosis not present

## 2014-05-29 DIAGNOSIS — IMO0002 Reserved for concepts with insufficient information to code with codable children: Secondary | ICD-10-CM

## 2014-05-29 DIAGNOSIS — E1165 Type 2 diabetes mellitus with hyperglycemia: Secondary | ICD-10-CM

## 2014-05-29 MED ORDER — PROMETHAZINE HCL 25 MG PO TABS: 25.0000 mg | ORAL_TABLET | Freq: Four times a day (QID) | ORAL | Status: AC | PRN

## 2014-05-29 MED ORDER — OXYMORPHONE HCL ER 20 MG PO T12A: 20.0000 mg | EXTENDED_RELEASE_TABLET | Freq: Two times a day (BID) | ORAL | Status: DC

## 2014-05-29 MED ORDER — METOPROLOL SUCCINATE ER 50 MG PO TB24: 50.0000 mg | ORAL_TABLET | Freq: Every day | ORAL | Status: DC

## 2014-05-29 NOTE — Progress Notes (Signed)
   Subjective:    Patient ID: Christina [name], female    DOB: [birth year], 49 y.o.   MRN: [id number]  HPI Here for several issues. First her BP at home has been running high and this worries her. No chest pain or SOB but she feels lightheaded at times. Also she asks if we can resume taking care of her diabetes. She had been seeing Dr. Cruzita Lederer , but she can no longer pay for this since her husband will be losing his insurance in the next week or two. Her random glucoses have been running around 150-180. Her A1c last January was 9.0. We started her on Synthroid last January for newly diagnosed hypothyroidism, and this needs to be checked. Lastly her immediate release hydromorphone no longer helps much with her pain and she asks to try Opana ER like her husband uses.    Review of Systems  Constitutional: Negative.   Respiratory: Negative.   Cardiovascular: Negative.   Neurological: Positive for light-headedness. Negative for dizziness, tremors, seizures, syncope, facial asymmetry, speech difficulty, weakness, numbness and headaches.       Objective:   Physical Exam  Constitutional: She is oriented to person, place, and time. She appears well-developed and well-nourished.  Neck: No thyromegaly present.  Cardiovascular: Normal rate, regular rhythm, normal heart sounds and intact distal pulses.   Pulmonary/Chest: Effort normal and breath sounds normal.  Lymphadenopathy:    She has no cervical adenopathy.  Neurological: She is alert and oriented to person, place, and time.          Assessment & Plan:  First I agreed to assume care of her diabetes, and we will check another A1c today. For the HTN we will start her on Metoprolol succinate 50 mg daily. For the chronic pain we will try Opana ER 20 mg bid. For the hypothyroidism we will check another TSH today.

## 2014-05-29 NOTE — Progress Notes (Signed)
Pre visit review using our clinic review tool, if applicable. No additional management support is needed unless otherwise documented below in the visit note. 

## 2014-05-30 LAB — HEMOGLOBIN A1C: HEMOGLOBIN A1C: 7.1 % — AB (ref 4.6–6.5)

## 2014-05-30 LAB — TSH: TSH: 2.13 u[IU]/mL (ref 0.35–4.50)

## 2014-06-02 ENCOUNTER — Telehealth: Payer: Self-pay | Admitting: Family Medicine

## 2014-06-02 MED ORDER — OPANA ER 20 MG PO T12A: 1.0000 | EXTENDED_RELEASE_TABLET | Freq: Two times a day (BID) | ORAL | Status: DC

## 2014-06-02 NOTE — Telephone Encounter (Signed)
PA for oxymorphone hcl er was denied.  Patient's plan requires patient to try and fail 3 of the following:  Nucynta ER, Opana ER, Morphine sulfate controlled release tablets, fentanyl transdermal .

## 2014-06-02 NOTE — Telephone Encounter (Signed)
Wrote for name brand Opana ER

## 2014-06-02 NOTE — Telephone Encounter (Signed)
Left message on machine for patient that Rx ready for pick up. 

## 2014-06-13 ENCOUNTER — Encounter: Payer: Self-pay | Admitting: Family Medicine

## 2014-06-13 ENCOUNTER — Ambulatory Visit (INDEPENDENT_AMBULATORY_CARE_PROVIDER_SITE_OTHER): Payer: 59 | Admitting: Family Medicine

## 2014-06-13 VITALS — BP 133/74 | HR 65 | Temp 98.8°F | Ht 62.5 in | Wt 227.0 lb

## 2014-06-13 DIAGNOSIS — M545 Low back pain, unspecified: Secondary | ICD-10-CM

## 2014-06-13 DIAGNOSIS — E1165 Type 2 diabetes mellitus with hyperglycemia: Secondary | ICD-10-CM | POA: Diagnosis not present

## 2014-06-13 DIAGNOSIS — E038 Other specified hypothyroidism: Secondary | ICD-10-CM

## 2014-06-13 DIAGNOSIS — IMO0002 Reserved for concepts with insufficient information to code with codable children: Secondary | ICD-10-CM

## 2014-06-13 DIAGNOSIS — I10 Essential (primary) hypertension: Secondary | ICD-10-CM

## 2014-06-13 NOTE — Progress Notes (Signed)
   Subjective:    Patient ID: Christina [name], female    DOB: Apr 09, 1965, 49 y.o.   MRN: [id number]  HPI Here to follow up several issues. At our last visit her A1c returned at 7.1. Her TSH was in range. We added Metoprolol to her regimen and her BP has come down nicely. She averaging 130s over 80s at home. We wrote for her to try Opana ER for pain but they had some trouble getting this filled at the pharmacy. She thinks this has been worked out now.    Review of Systems  Constitutional: Negative.   Respiratory: Negative.   Cardiovascular: Negative.   Musculoskeletal: Positive for back pain.       Objective:   Physical Exam  Constitutional: She is oriented to person, place, and time. She appears well-developed and well-nourished.  Cardiovascular: Normal rate, regular rhythm, normal heart sounds and intact distal pulses.   Pulmonary/Chest: Effort normal and breath sounds normal.  Neurological: She is alert and oriented to person, place, and time.          Assessment & Plan:  Her diabetes and HTN are now well controlled. Hopefully she can pick up the Opana ER to try this .

## 2014-06-13 NOTE — Progress Notes (Signed)
Pre visit review using our clinic review tool, if applicable. No additional management support is needed unless otherwise documented below in the visit note. 

## 2014-06-20 ENCOUNTER — Other Ambulatory Visit: Payer: Self-pay | Admitting: Family Medicine

## 2014-06-20 NOTE — Telephone Encounter (Signed)
Refill Flexeril #90 with 5 rf, also Tramadol #120 with 5 rf

## 2014-06-22 ENCOUNTER — Ambulatory Visit: Payer: 59 | Admitting: Dietician

## 2014-06-24 ENCOUNTER — Other Ambulatory Visit: Payer: Self-pay | Admitting: Family Medicine

## 2014-07-02 ENCOUNTER — Other Ambulatory Visit: Payer: Self-pay | Admitting: Family Medicine

## 2014-07-03 ENCOUNTER — Other Ambulatory Visit: Payer: Self-pay | Admitting: Family Medicine

## 2014-07-04 ENCOUNTER — Telehealth: Payer: Self-pay | Admitting: Family Medicine

## 2014-07-05 MED ORDER — HYDROMORPHONE HCL 4 MG PO TABS: 4.0000 mg | ORAL_TABLET | Freq: Four times a day (QID) | ORAL | Status: DC | PRN

## 2014-07-05 NOTE — Telephone Encounter (Signed)
done

## 2014-07-05 NOTE — Telephone Encounter (Signed)
Left message to advise pt Rx ready for pick up 

## 2014-07-05 NOTE — Addendum Note (Signed)
Addended by: Alysia Penna A on: 07/05/2014 01:17 PM   Modules accepted: Orders

## 2014-07-09 ENCOUNTER — Encounter: Payer: Self-pay | Admitting: Family Medicine

## 2014-07-10 MED ORDER — METFORMIN HCL ER 500 MG PO TB24: 500.0000 mg | ORAL_TABLET | Freq: Two times a day (BID) | ORAL | Status: DC

## 2014-07-24 ENCOUNTER — Other Ambulatory Visit: Payer: Self-pay

## 2014-07-24 ENCOUNTER — Other Ambulatory Visit: Payer: Self-pay | Admitting: Family Medicine

## 2014-09-17 ENCOUNTER — Other Ambulatory Visit: Payer: Self-pay | Admitting: Family Medicine

## 2014-10-02 ENCOUNTER — Other Ambulatory Visit: Payer: Self-pay | Admitting: Family Medicine

## 2014-10-06 ENCOUNTER — Encounter: Payer: Self-pay | Admitting: Family Medicine

## 2014-10-06 ENCOUNTER — Ambulatory Visit (INDEPENDENT_AMBULATORY_CARE_PROVIDER_SITE_OTHER): Payer: PPO | Admitting: Family Medicine

## 2014-10-06 VITALS — BP 151/88 | HR 72 | Temp 98.1°F | Ht 62.5 in | Wt 230.0 lb

## 2014-10-06 DIAGNOSIS — J4540 Moderate persistent asthma, uncomplicated: Secondary | ICD-10-CM

## 2014-10-06 DIAGNOSIS — Z86010 Personal history of colonic polyps: Secondary | ICD-10-CM

## 2014-10-06 DIAGNOSIS — M545 Low back pain, unspecified: Secondary | ICD-10-CM

## 2014-10-06 DIAGNOSIS — I10 Essential (primary) hypertension: Secondary | ICD-10-CM | POA: Diagnosis not present

## 2014-10-06 DIAGNOSIS — Z23 Encounter for immunization: Secondary | ICD-10-CM | POA: Diagnosis not present

## 2014-10-06 DIAGNOSIS — E119 Type 2 diabetes mellitus without complications: Secondary | ICD-10-CM

## 2014-10-06 MED ORDER — FENOFIBRATE 160 MG PO TABS: 160.0000 mg | ORAL_TABLET | Freq: Every day | ORAL | Status: DC

## 2014-10-06 MED ORDER — METOPROLOL SUCCINATE ER 100 MG PO TB24: 100.0000 mg | ORAL_TABLET | Freq: Every day | ORAL | Status: DC

## 2014-10-06 MED ORDER — HYDROMORPHONE HCL 4 MG PO TABS: 4.0000 mg | ORAL_TABLET | Freq: Four times a day (QID) | ORAL | Status: DC | PRN

## 2014-10-06 NOTE — Progress Notes (Signed)
Pre visit review using our clinic review tool, if applicable. No additional management support is needed unless otherwise documented below in the visit note. 

## 2014-10-09 ENCOUNTER — Encounter: Payer: Self-pay | Admitting: Family Medicine

## 2014-10-09 NOTE — Progress Notes (Signed)
   Subjective:    Patient ID: Christina [name], female    DOB: 01-Jan-1966, 49 y.o.   MRN: [id number]  HPI Here to follow up on several issues. Her BP has been running high at home, often in the 453M systolic. Her chronic back oain has been well controlled on her current meds. She is due to see Christina [name] soon. Her glucoses have been running a bit high at home, often getting am fasting values of 140 to 150. Her asthma has been stable.    Review of Systems  Constitutional: Negative.   Respiratory: Negative.   Cardiovascular: Negative.   Musculoskeletal: Positive for back pain.  Neurological: Negative.        Objective:   Physical Exam  Constitutional: She is oriented to person, place, and time. She appears well-developed and well-nourished.  Neck: No thyromegaly present.  Cardiovascular: Normal rate, regular rhythm, normal heart sounds and intact distal pulses.   Pulmonary/Chest: Effort normal and breath sounds normal.  Lymphadenopathy:    She has no cervical adenopathy.  Neurological: She is alert and oriented to person, place, and time.          Assessment & Plan:  Her pain control is adequate and her meds were refilled. She will see Christina [name] soon. Her asthma is stable. Her BP is a bit high so we will increase the Metoprolol to 100 mg daily. Given a flu shot. Refer for a colonoscopy.

## 2014-10-17 ENCOUNTER — Encounter: Payer: Self-pay | Admitting: Family Medicine

## 2014-10-17 NOTE — Telephone Encounter (Signed)
Tell her that Amy Frederik Pear is a NP and she does not perform colonoscopies. Right now we only have female doctors to do this

## 2014-10-18 ENCOUNTER — Other Ambulatory Visit: Payer: Self-pay | Admitting: Family Medicine

## 2014-10-20 ENCOUNTER — Encounter: Payer: Self-pay | Admitting: Family Medicine

## 2014-10-20 DIAGNOSIS — K921 Melena: Secondary | ICD-10-CM

## 2014-10-20 NOTE — Telephone Encounter (Signed)
Referral was done  

## 2014-10-23 ENCOUNTER — Telehealth: Payer: Self-pay | Admitting: Family Medicine

## 2014-10-23 NOTE — Telephone Encounter (Signed)
Pt has seen another GI MD in [place] since seeing Dr Cristina Gong. They are happy to see her again but are currently waiting to see which MD the patient would like to use going forward.

## 2014-10-30 ENCOUNTER — Encounter: Payer: Self-pay | Admitting: Family Medicine

## 2014-10-30 DIAGNOSIS — Z86010 Personal history of colonic polyps: Secondary | ICD-10-CM

## 2014-10-30 NOTE — Telephone Encounter (Signed)
Called Dr.  Ronald Lobo MD office : [phone]) to check on the status of pt referral was told by jennifer that they contact the pt on  Sept 20 to scheduled .pt told them she would like to  speak with her husband first and she would call back . As of today the has not call their office back to schedule spoke with jennifer in Dr [employer] office . Pt can call their office to schedule when she's ready

## 2014-10-30 NOTE — Telephone Encounter (Signed)
Can you check into this for pt? I see the referral was done.

## 2014-10-31 NOTE — Telephone Encounter (Signed)
Referral was done  

## 2014-11-29 ENCOUNTER — Telehealth: Payer: Self-pay | Admitting: Gastroenterology

## 2014-11-29 NOTE — Telephone Encounter (Signed)
Received [facility] records and placed on Dr. Woodward Ku desk for review. Patient is requesting a female provider.

## 2014-12-04 NOTE — Telephone Encounter (Signed)
Dr. Silverio Decamp has accepted patient. Ok to schedule Direct Colon. Left message for patient to return my call.

## 2014-12-10 ENCOUNTER — Other Ambulatory Visit: Payer: Self-pay | Admitting: Family Medicine

## 2014-12-14 ENCOUNTER — Other Ambulatory Visit: Payer: Self-pay

## 2014-12-18 NOTE — Telephone Encounter (Signed)
Office visit scheduled for 02-05-15 with Dr. Silverio Decamp

## 2014-12-20 ENCOUNTER — Other Ambulatory Visit: Payer: Self-pay | Admitting: Family Medicine

## 2014-12-20 NOTE — Telephone Encounter (Signed)
Call in #120 with 5 rf 

## 2014-12-23 ENCOUNTER — Encounter: Payer: Self-pay | Admitting: Family Medicine

## 2014-12-25 ENCOUNTER — Telehealth: Payer: Self-pay | Admitting: Family Medicine

## 2014-12-25 NOTE — Telephone Encounter (Signed)
Pt request refill of the following: traMADol (ULTRAM) 50 MG tablet   Phamacy: [facility] [place]

## 2014-12-25 NOTE — Telephone Encounter (Signed)
This is a duplicate request, please see my chart message.

## 2014-12-31 ENCOUNTER — Other Ambulatory Visit: Payer: Self-pay | Admitting: Family Medicine

## 2015-01-14 ENCOUNTER — Encounter: Payer: Self-pay | Admitting: Family Medicine

## 2015-01-15 ENCOUNTER — Other Ambulatory Visit: Payer: Self-pay | Admitting: Family Medicine

## 2015-01-15 MED ORDER — POTASSIUM CHLORIDE CRYS ER 10 MEQ PO TBCR: EXTENDED_RELEASE_TABLET | ORAL | Status: DC

## 2015-01-15 NOTE — Telephone Encounter (Signed)
We gave her a year long supply of this last June

## 2015-02-05 ENCOUNTER — Telehealth: Payer: Self-pay | Admitting: Family Medicine

## 2015-02-05 ENCOUNTER — Encounter: Payer: Self-pay | Admitting: Family Medicine

## 2015-02-05 ENCOUNTER — Ambulatory Visit: Payer: PPO | Admitting: Gastroenterology

## 2015-02-05 MED ORDER — TRAMADOL HCL 50 MG PO TABS: 100.0000 mg | ORAL_TABLET | Freq: Three times a day (TID) | ORAL | Status: DC | PRN

## 2015-02-05 MED ORDER — HYDROMORPHONE HCL 4 MG PO TABS: 4.0000 mg | ORAL_TABLET | Freq: Four times a day (QID) | ORAL | Status: DC | PRN

## 2015-02-05 NOTE — Telephone Encounter (Signed)
done

## 2015-02-05 NOTE — Addendum Note (Signed)
Addended by: Alysia Penna A on: 02/05/2015 12:50 PM   Modules accepted: Orders

## 2015-02-05 NOTE — Telephone Encounter (Signed)
Script for Dilaudid is ready for pick up at front office.

## 2015-02-05 NOTE — Telephone Encounter (Signed)
Change Tramadol 50 mg to 2 tabs TID, call in #[phone] with 5 rf

## 2015-02-07 ENCOUNTER — Other Ambulatory Visit: Payer: Self-pay | Admitting: Family Medicine

## 2015-03-28 ENCOUNTER — Ambulatory Visit (INDEPENDENT_AMBULATORY_CARE_PROVIDER_SITE_OTHER): Payer: PPO | Admitting: Gastroenterology

## 2015-03-28 ENCOUNTER — Encounter: Payer: Self-pay | Admitting: Gastroenterology

## 2015-03-28 VITALS — BP 136/80 | HR 76 | Ht 62.5 in | Wt 231.0 lb

## 2015-03-28 DIAGNOSIS — K219 Gastro-esophageal reflux disease without esophagitis: Secondary | ICD-10-CM | POA: Diagnosis not present

## 2015-03-28 DIAGNOSIS — R1310 Dysphagia, unspecified: Secondary | ICD-10-CM | POA: Diagnosis not present

## 2015-03-28 DIAGNOSIS — K625 Hemorrhage of anus and rectum: Secondary | ICD-10-CM | POA: Diagnosis not present

## 2015-03-28 MED ORDER — OMEPRAZOLE 20 MG PO CPDR: 20.0000 mg | DELAYED_RELEASE_CAPSULE | Freq: Two times a day (BID) | ORAL | Status: DC

## 2015-03-28 MED ORDER — OMEPRAZOLE 20 MG PO CPDR: DELAYED_RELEASE_CAPSULE | ORAL | Status: DC

## 2015-03-28 MED ORDER — NA SULFATE-K SULFATE-MG SULF 17.5-3.13-1.6 GM/177ML PO SOLN: 1.0000 | Freq: Once | ORAL | Status: DC

## 2015-03-28 NOTE — Patient Instructions (Signed)
You have been scheduled for an endoscopy. Please follow written instructions given to you at your visit today. If you use inhalers (even only as needed), please bring them with you on the day of your procedure.   We have sent the following medications to your pharmacy for you to pick up at your convenience:  Omeprazole  

## 2015-04-11 ENCOUNTER — Other Ambulatory Visit: Payer: Self-pay | Admitting: Family Medicine

## 2015-04-25 ENCOUNTER — Other Ambulatory Visit: Payer: Self-pay | Admitting: Family Medicine

## 2015-05-02 ENCOUNTER — Encounter: Payer: Self-pay | Admitting: Gastroenterology

## 2015-05-02 ENCOUNTER — Ambulatory Visit (AMBULATORY_SURGERY_CENTER): Payer: PPO | Admitting: Gastroenterology

## 2015-05-02 VITALS — BP 114/71 | HR 64 | Temp 97.5°F | Resp 12 | Ht 62.0 in | Wt 231.0 lb

## 2015-05-02 DIAGNOSIS — K2950 Unspecified chronic gastritis without bleeding: Secondary | ICD-10-CM | POA: Diagnosis not present

## 2015-05-02 DIAGNOSIS — K219 Gastro-esophageal reflux disease without esophagitis: Secondary | ICD-10-CM | POA: Diagnosis not present

## 2015-05-02 DIAGNOSIS — K2990 Gastroduodenitis, unspecified, without bleeding: Secondary | ICD-10-CM

## 2015-05-02 DIAGNOSIS — K625 Hemorrhage of anus and rectum: Secondary | ICD-10-CM | POA: Diagnosis not present

## 2015-05-02 DIAGNOSIS — I10 Essential (primary) hypertension: Secondary | ICD-10-CM | POA: Diagnosis not present

## 2015-05-02 DIAGNOSIS — K2970 Gastritis, unspecified, without bleeding: Secondary | ICD-10-CM

## 2015-05-02 DIAGNOSIS — R1310 Dysphagia, unspecified: Secondary | ICD-10-CM | POA: Diagnosis not present

## 2015-05-02 DIAGNOSIS — E119 Type 2 diabetes mellitus without complications: Secondary | ICD-10-CM | POA: Diagnosis not present

## 2015-05-02 HISTORY — PX: COLONOSCOPY: SHX174

## 2015-05-02 LAB — GLUCOSE, CAPILLARY
Glucose-Capillary: 164 mg/dL — ABNORMAL HIGH (ref 65–99)
Glucose-Capillary: 129 mg/dL — ABNORMAL HIGH (ref 65–99)

## 2015-05-02 MED ORDER — SODIUM CHLORIDE 0.9 % IV SOLN: 500.0000 mL | INTRAVENOUS | Status: DC

## 2015-05-02 NOTE — Patient Instructions (Addendum)
YOU HAD AN ENDOSCOPIC PROCEDURE TODAY AT [facility]:   Refer to the procedure report that was given to you for any specific questions about what was found during the examination.  If the procedure report does not answer your questions, please call your gastroenterologist to clarify.  If you requested that your care partner not be given the details of your procedure findings, then the procedure report has been included in a sealed envelope for you to review at your convenience later.  YOU SHOULD EXPECT: Some feelings of bloating in the abdomen. Passage of more gas than usual.  Walking can help get rid of the air that was put into your GI tract during the procedure and reduce the bloating. If you had a lower endoscopy (such as a colonoscopy or flexible sigmoidoscopy) you may notice spotting of blood in your stool or on the toilet paper. If you underwent a bowel prep for your procedure, you may not have a normal bowel movement for a few days.  Please Note:  You might notice some irritation and congestion in your nose or some drainage.  This is from the oxygen used during your procedure.  There is no need for concern and it should clear up in a day or so.  SYMPTOMS TO REPORT IMMEDIATELY:   Following lower endoscopy (colonoscopy or flexible sigmoidoscopy):  Excessive amounts of blood in the stool  Significant tenderness or worsening of abdominal pains  Swelling of the abdomen that is new, acute  Fever of 100F or higher   Following upper endoscopy (EGD)  Vomiting of blood or coffee ground material  New chest pain or pain under the shoulder blades  Painful or persistently difficult swallowing  New shortness of breath  Fever of 100F or higher  Black, tarry-looking stools  For urgent or emergent issues, a gastroenterologist can be reached at any hour by calling [phone].   DIET:   Drink plenty of fluids but you should avoid alcoholic beverages for 24 hours.  Please follow  the dilatation diet the rest of the day.  Follow the handout given to your care partner.  ACTIVITY:  You should plan to take it easy for the rest of today and you should NOT DRIVE or use heavy machinery until tomorrow (because of the sedation medicines used during the test).    FOLLOW UP: Our staff will call the number listed on your records the next business day following your procedure to check on you and address any questions or concerns that you may have regarding the information given to you following your procedure. If we do not reach you, we will leave a message.  However, if you are feeling well and you are not experiencing any problems, there is no need to return our call.  We will assume that you have returned to your regular daily activities without incident.  If any biopsies were taken you will be contacted by phone or by letter within the next 1-3 weeks.  Please call us at [phone] if you have not heard about the biopsies in 3 weeks.    SIGNATURES/CONFIDENTIALITY: You and/or your care partner have signed paperwork which will be entered into your electronic medical record.  These signatures attest to the fact that that the information above on your After Visit Summary has been reviewed and is understood.  Full responsibility of the confidentiality of this discharge information lies with you and/or your care-partner.    Handouts were given to your care  partner on hemorrhoids, diverticulosis, a high fiber diet with liberal fluid intake, gastritis and the esophageal dilatation diet to follow the rest of today. Your blood sugar 129 in the recovery room. You may resume your current medications today. Await biopsy results. Please call if any questions or concerns.

## 2015-05-02 NOTE — Progress Notes (Addendum)
No egg or soy allergy known to patient  No issues with past sedation with any surgeries  or procedures, no intubation problems  No diet pills per patient No home 02 use per patient  No blood thinners per patient  Pt denies issues with constipation   Pt has wheel chair and can transfer and dress with assistance. Husband with her to assist.

## 2015-05-02 NOTE — Progress Notes (Signed)
No problems noted in the recovery room. maw 

## 2015-05-02 NOTE — Progress Notes (Signed)
Report to PACU, RN, vss, BBS= Clear.  

## 2015-05-02 NOTE — Op Note (Signed)
[facility] Patient Name: Christina [name] Procedure Date: 05/02/2015 1:45 PM MRN: [id number] Endoscopist: Mauri Pole , MD Age: 50 Referring MD:  Date of Birth: [birth year] Gender: Female Procedure:                Upper GI endoscopy Indications:              Dysphagia, Heartburn, Suspected esophageal reflux Medicines:                Monitored Anesthesia Care Procedure:                Pre-Anesthesia Assessment:                           - Prior to the procedure, a History and Physical                            was performed, and patient medications and                            allergies were reviewed. The patient's tolerance of                            previous anesthesia was also reviewed. The risks                            and benefits of the procedure and the sedation                            options and risks were discussed with the patient.                            All questions were answered, and informed consent                            was obtained. Prior Anticoagulants: The patient has                            taken no previous anticoagulant or antiplatelet                            agents. ASA Grade Assessment: III - A patient with                            severe systemic disease. After reviewing the risks                            and benefits, the patient was deemed in                            satisfactory condition to undergo the procedure.                           After obtaining informed consent, the endoscope was  passed under direct vision. Throughout the                            procedure, the patient's blood pressure, pulse, and                            oxygen saturations were monitored continuously. The                            Model GIF-HQ190 [id number]) scope was introduced                            through the mouth, and advanced to the second part                            of duodenum. The upper  GI endoscopy was                            accomplished without difficulty. The patient                            tolerated the procedure well. Scope In: Scope Out: Findings:      One mild benign-appearing, intrinsic stenosis was found 35 to 36 cm from       the incisors. This measured less than one cm (in length) and was       traversed. A TTS dilator was passed through the scope. Dilation with a       15-16.5-18 mm balloon dilator was performed to 18 mm. The dilation site       was examined and showed no change. Estimated blood loss: none. Biopsies       were taken from proximal and distal esophagus      Localized mild inflammation characterized by erythema was found in the       prepyloric region of the stomach. Biopsies were taken with a cold       forceps for histology. Biopsies were taken with a cold forceps for       Helicobacter pylori testing.      The duodenal bulb and second portion of the duodenum were normal. Complications:            No immediate complications. Estimated Blood Loss:     Estimated blood loss was minimal. Impression:               - Benign-appearing esophageal stenosis. Dilated.                           - Erosive nodular gastritis. Biopsied.                           - Normal duodenal bulb and second portion of the                            duodenum. Recommendation:           - Patient has a contact number available for  emergencies. The signs and symptoms of potential                            delayed complications were discussed with the                            patient. Return to normal activities tomorrow.                            Written discharge instructions were provided to the                            patient.                           - Resume previous diet.                           - Continue present medications.                           - Await pathology results.                           - No repeat upper  endoscopy.                           - Return to GI office in 3 months. Mauri Pole, MD 05/02/2015 2:38:37 PM This report has been signed electronically. Number of Addenda: 0 Referring MD:      Standley Brooking, MD

## 2015-05-02 NOTE — Progress Notes (Signed)
Pt was in the rest room for a while before discharge.  Pt had no complaints at discharge. maw

## 2015-05-02 NOTE — Progress Notes (Signed)
Called to room to assist during endoscopic procedure.  Patient ID and intended procedure confirmed with present staff. Received instructions for my participation in the procedure from the performing physician.  

## 2015-05-02 NOTE — Op Note (Signed)
[facility] Patient Name: Christina [name] Procedure Date: 05/02/2015 1:44 PM MRN: [id number] Endoscopist: Mauri Pole , MD Age: 50 Referring MD:  Date of Birth: 1965-05-09 Gender: Female Procedure:                Colonoscopy Indications:              Rectal bleeding Medicines:                Monitored Anesthesia Care Procedure:                Pre-Anesthesia Assessment:                           - Prior to the procedure, a History and Physical                            was performed, and patient medications and                            allergies were reviewed. The patient's tolerance of                            previous anesthesia was also reviewed. The risks                            and benefits of the procedure and the sedation                            options and risks were discussed with the patient.                            All questions were answered, and informed consent                            was obtained. Prior Anticoagulants: The patient has                            taken no previous anticoagulant or antiplatelet                            agents. ASA Grade Assessment: III - A patient with                            severe systemic disease. After reviewing the risks                            and benefits, the patient was deemed in                            satisfactory condition to undergo the procedure.                           After obtaining informed consent, the colonoscope  was passed under direct vision. Throughout the                            procedure, the patient's blood pressure, pulse, and                            oxygen saturations were monitored continuously. The                            Model CF-HQ190L [id number]) scope was introduced                            through the anus and advanced to the the cecum,                            identified by appendiceal orifice and ileocecal              valve. The colonoscopy was performed without                            difficulty. The patient tolerated the procedure                            well. The quality of the bowel preparation was                            adequate. The ileocecal valve, appendiceal orifice,                            and rectum were photographed. Scope In: 2:11:32 PM Scope Out: 2:24:40 PM Scope Withdrawal Time: 0 hours 7 minutes 16 seconds  Total Procedure Duration: 0 hours 13 minutes 8 seconds  Findings:      The perianal exam findings include internal hemorrhoids that prolapse       with straining, but require manual replacement into the anal canal       (Grade III).      Non-bleeding external internal hemorrhoids were found during       retroflexion. The hemorrhoids were large and Grade III (internal       hemorrhoids that prolapse but require manual reduction).      The exam was otherwise without abnormality.      A few small-mouthed diverticula were found in the sigmoid colon. Complications:            No immediate complications. Estimated Blood Loss:     Estimated blood loss: none. Impression:               - Internal hemorrhoids that prolapse with                            straining, but require manual replacement into the                            anal canal (Grade III) found on perianal exam.                           -  Non-bleeding external internal hemorrhoids.                           - The examination was otherwise normal.                           - Diverticulosis in the sigmoid colon.                           - No specimens collected. Recommendation:           - Patient has a contact number available for                            emergencies. The signs and symptoms of potential                            delayed complications were discussed with the                            patient. Return to normal activities tomorrow.                            Written discharge  instructions were provided to the                            patient.                           - Resume previous diet.                           - Continue present medications.                           - Repeat colonoscopy in 10 years for screening                            purposes.                           - Return to GI clinic in 2 weeks for hemorrhoidal                            banding. Mauri Pole, MD 05/02/2015 2:41:28 PM This report has been signed electronically. Number of Addenda: 0 Referring MD:      Standley Brooking, MD

## 2015-05-03 ENCOUNTER — Telehealth: Payer: Self-pay | Admitting: *Deleted

## 2015-05-03 NOTE — Telephone Encounter (Signed)
No answer, message left for the patient. 

## 2015-05-08 ENCOUNTER — Other Ambulatory Visit: Payer: Self-pay

## 2015-05-08 ENCOUNTER — Encounter: Payer: Self-pay | Admitting: Gastroenterology

## 2015-05-12 ENCOUNTER — Other Ambulatory Visit: Payer: Self-pay | Admitting: Internal Medicine

## 2015-05-14 ENCOUNTER — Telehealth: Payer: Self-pay | Admitting: Gastroenterology

## 2015-05-14 NOTE — Telephone Encounter (Signed)
Ok to review #60 tabs with no refills. Needs appt for further refills.

## 2015-05-14 NOTE — Telephone Encounter (Signed)
Pt hasn't been seen in over a year and no future appt., please advise  

## 2015-05-17 ENCOUNTER — Other Ambulatory Visit: Payer: Self-pay | Admitting: Family Medicine

## 2015-05-17 NOTE — Progress Notes (Signed)
Christina [name]    [id number]    05-02-1965  Primary Care Physician:FRY,STEPHEN A, MD  Referring Physician: Laurey Morale, MD [place], [place] [address]  Chief complaint: GERD, dysphagia, rectal bleeding HPI: 58 yr F here for new patient with c/o chronic GERD, and intermittent bright red blood per rectum. She is on PPI with some breakthrough heartburn and also c/o intermittent dysphagia to solids. No food impactions. She is having small volume intermittent bright red blood per rectum, worse when she strains more with bowel movement. No pain, does feels hemorrhoids come out sometimes. Denies any nausea or vomiting. Weight stable.    Outpatient Encounter Prescriptions as of 03/28/2015  Medication Sig  . aspirin 81 MG EC tablet Take 81 mg by mouth daily.    . cholecalciferol (VITAMIN D) 1000 UNITS tablet Take 1,000 Units by mouth daily.  . [facility] ULTRA THIN LANCETS MISC 1 strip by Does not apply route as directed.  . cyanocobalamin (,VITAMIN B-12,) 1000 MCG/ML injection Inject 1 mL (1,000 mcg total) into the muscle once a week.  . cyclobenzaprine (FLEXERIL) 10 MG tablet TAKE 1 TABLET (10 MG TOTAL) BY MOUTH 3 (THREE) TIMES DAILY AS NEEDED FOR MUSCLE SPASMS.  [name] desonide (DESOWEN) 0.05 % cream Apply topically 2 (two) times daily.    . diclofenac (VOLTAREN) 75 MG EC tablet TAKE 1 TABLET (75 MG TOTAL) BY MOUTH 2 (TWO) TIMES DAILY.  [name] diltiazem (CARDIZEM CD) 240 MG 24 hr capsule TAKE ONE CAPSULE BY MOUTH EVERY DAY  . diphenhydrAMINE (BENADRYL) 25 mg capsule Take 25 mg by mouth as needed.    . fenofibrate 160 MG tablet Take 1 tablet (160 mg total) by mouth daily.  [name] gabapentin (NEURONTIN) 300 MG capsule TAKE 2 CAPSULES (600 MG TOTAL) BY MOUTH 3 (THREE) TIMES DAILY.  [name] glucose blood (TRUETEST TEST) test strip Use as instructed  . HYDROmorphone (DILAUDID) 4 MG tablet Take 1 tablet (4 mg total) by mouth every 6 (six) hours as needed for severe pain.  [name] ketoconazole  (NIZORAL) 2 % shampoo Apply topically 2 (two) times a week.  . levothyroxine (SYNTHROID, LEVOTHROID) 50 MCG tablet TAKE 1 TABLET (50 MCG TOTAL) BY MOUTH DAILY.  . metFORMIN (GLUCOPHAGE-XR) 500 MG 24 hr tablet Take 1 tablet (500 mg total) by mouth 2 (two) times daily with a meal.  . metoprolol succinate (TOPROL-XL) 100 MG 24 hr tablet Take 1 tablet (100 mg total) by mouth daily. Take with or immediately following a meal.  . mupirocin ointment (BACTROBAN) 2 % Apply topically 3 (three) times daily.  [name] omeprazole (PRILOSEC) 20 MG capsule Take 1 capsule (20 mg total) by mouth 2 (two) times daily.  . potassium chloride (KLOR-CON M10) 10 MEQ tablet TAKE 1 TABLET (10 MEQ TOTAL) BY MOUTH DAILY.  [name] promethazine (PHENERGAN) 25 MG tablet Take 1 tablet (25 mg total) by mouth every 6 (six) hours as needed for nausea.  . sertraline (ZOLOFT) 100 MG tablet TAKE 1 TABLET (100 MG TOTAL) BY MOUTH DAILY.  . SYRINGE-NEEDLE, DISP, 3 ML (MONOJECT 3CC SYR 25GX1-1/4") 25G X 1-1/4" 3 ML MISC by Does not apply route as directed.    . traMADol (ULTRAM) 50 MG tablet Take 2 tablets (100 mg total) by mouth 3 (three) times daily as needed.  . [DISCONTINUED] fenofibrate 160 MG tablet TAKE 1 TABLET BY MOUTH EVERY DAY  . [DISCONTINUED] glipiZIDE (GLUCOTROL) 5 MG tablet Take 1 tablet (5 mg  total) by mouth 2 (two) times daily before a meal.  . [DISCONTINUED] hydrochlorothiazide (HYDRODIURIL) 25 MG tablet TAKE 1 TABLET (25 MG TOTAL) BY MOUTH DAILY.  . [DISCONTINUED] omeprazole (PRILOSEC) 20 MG capsule Take 1 capsule (20 mg total) by mouth 2 (two) times daily.  . [DISCONTINUED] omeprazole (PRILOSEC) 20 MG capsule TAKE ONE CAPSULE 2 TIMES A DAY  . [DISCONTINUED] omeprazole (PRILOSEC) 20 MG capsule TAKE ONE CAPSULE 2 TIMES A DAY  . [DISCONTINUED] sulfamethoxazole-trimethoprim (BACTRIM DS,SEPTRA DS) 800-160 MG per tablet TAKE 1 TABLET BY MOUTH TWICE A DAY  . [DISCONTINUED] Na Sulfate-K Sulfate-Mg Sulf SOLN Take 1 kit by mouth once.   No  facility-administered encounter medications on file as of 03/28/2015.    Allergies as of 03/28/2015 - Review Complete 03/28/2015  Allergen Reaction Noted  . Albuterol  09/11/2006  . Antihistamines, loratadine-type  05/29/2014  . Bee venom  05/29/2014  . Clindamycin/lincomycin  02/21/2014  . Codeine  09/11/2006  . Doxycycline  09/11/2006  . Lisinopril  05/02/2009  . Metronidazole  05/10/2009  . Ondansetron hcl  05/10/2009  . Oxycodone-acetaminophen  09/11/2006  . Tylox [oxycodone-acetaminophen]  10/26/2012  . Vancomycin  05/10/2009    Past Medical History  Diagnosis Date  . Allergy   . Asthma   . GERD (gastroesophageal reflux disease)   . Hypertension   . Meningitis   . Hx of colonic polyps   . Low back pain   . Interstitial cystitis     per Dr. Risa Grill  . Angioedema   . Eczema   . Diabetes mellitus     sees Dr. Cruzita Lederer   . Blood transfusion without reported diagnosis     with both vaginal deliveries bleed out and had blood  . Hyperlipidemia   . DDD (degenerative disc disease), lumbar   . Thyroid disease   . Seizures ([place])     seizures with brain tumor-no sz since 1984    Past Surgical History  Procedure Laterality Date  . Abdominal hysterectomy    . Tubal ligation      bilateral  . Brain tumor excision      benign 1984  . Tonsillectomy    . Cystic mass  4.4.11    remval from abdomen per Dr. Kendell Bane at [facility]  . Svd x 2    . Colonoscopy  05-08-08    per Dr. Cristina Gong, benign polyps, repeat in 5 yrs   . Cholecystectomy    . Polypectomy      Family History  Problem Relation Age of Onset  . Hypertension      family hx  . Stroke Mother   . Rheum arthritis Mother   . COPD Mother   . Stroke Father   . Colon cancer Father   . Colon polyps Neg Hx     Social History   Social History  . Marital Status: Married    Spouse Name: N/A  . Number of Children: N/A  . Years of Education: N/A   Occupational History  . Not on file.   Social  History Main Topics  . Smoking status: Former Research scientist (life sciences)  . Smokeless tobacco: Never Used  . Alcohol Use: No  . Drug Use: No  . Sexual Activity: Not on file   Other Topics Concern  . Not on file   Social History Narrative      Review of systems: Review of Systems  Constitutional: Negative for fever and chills.  HENT: Negative.   Eyes: Negative for blurred  vision.  Respiratory: Negative for cough, shortness of breath and wheezing.   Cardiovascular: Negative for chest pain and palpitations.  Gastrointestinal: as per HPI Genitourinary: Negative for dysuria, urgency, frequency and hematuria.  Musculoskeletal: Negative for myalgias, back pain and joint pain.  Skin: Negative for itching and rash.  Neurological: Negative for dizziness, tremors, focal weakness, seizures and loss of consciousness.  Endo/Heme/Allergies: Negative for environmental allergies.  Psychiatric/Behavioral: Negative for depression, suicidal ideas and hallucinations.  All other systems reviewed and are negative.   Physical Exam: Filed Vitals:   03/28/15 1505  BP: 136/80  Pulse: 76   Gen:      No acute distress HEENT:  EOMI, sclera anicteric Neck:     No masses; no thyromegaly Lungs:    Clear to auscultation bilaterally; normal respiratory effort CV:         Regular rate and rhythm; no murmurs Abd:      + bowel sounds; soft, non-tender; no palpable masses, no distension Ext:    No edema; adequate peripheral perfusion Skin:      Warm and dry; no rash Neuro: alert and oriented x 3 Psych: normal mood and affect  Data Reviewed: Reviewed chart in epic  Assessment and Plan/Recommendations: 11 yr F with GERD with c/o intermittent solid dysphagia and small volume bright red blood per rectum likely hemorrhoidal but will need to exclude other etiologies Will schedule for EGD and colonoscopy  PPI and anti reflux measures  K. Denzil Magnuson , MD [phone] Mon-Fri 8a-5p [phone] after 5p, weekends,  holidays

## 2015-05-20 ENCOUNTER — Other Ambulatory Visit: Payer: Self-pay | Admitting: Internal Medicine

## 2015-05-20 ENCOUNTER — Other Ambulatory Visit: Payer: Self-pay | Admitting: Family Medicine

## 2015-05-21 ENCOUNTER — Other Ambulatory Visit: Payer: Self-pay | Admitting: *Deleted

## 2015-05-21 NOTE — Telephone Encounter (Signed)
Opened encounter in error  

## 2015-05-24 ENCOUNTER — Other Ambulatory Visit: Payer: Self-pay | Admitting: Family Medicine

## 2015-05-29 ENCOUNTER — Telehealth: Payer: Self-pay | Admitting: Family Medicine

## 2015-05-29 MED ORDER — HYDROMORPHONE HCL 4 MG PO TABS: 4.0000 mg | ORAL_TABLET | Freq: Four times a day (QID) | ORAL | Status: DC | PRN

## 2015-05-29 NOTE — Telephone Encounter (Signed)
done

## 2015-06-05 ENCOUNTER — Ambulatory Visit (INDEPENDENT_AMBULATORY_CARE_PROVIDER_SITE_OTHER): Payer: PPO | Admitting: Family Medicine

## 2015-06-05 ENCOUNTER — Encounter: Payer: Self-pay | Admitting: Family Medicine

## 2015-06-05 VITALS — BP 137/79 | HR 68 | Temp 97.8°F | Ht 62.0 in | Wt 230.0 lb

## 2015-06-05 DIAGNOSIS — E119 Type 2 diabetes mellitus without complications: Secondary | ICD-10-CM | POA: Diagnosis not present

## 2015-06-05 DIAGNOSIS — M5137 Other intervertebral disc degeneration, lumbosacral region: Secondary | ICD-10-CM | POA: Diagnosis not present

## 2015-06-05 DIAGNOSIS — I10 Essential (primary) hypertension: Secondary | ICD-10-CM

## 2015-06-05 DIAGNOSIS — E1142 Type 2 diabetes mellitus with diabetic polyneuropathy: Secondary | ICD-10-CM

## 2015-06-05 MED ORDER — GLIPIZIDE 5 MG PO TABS: ORAL_TABLET | ORAL | Status: DC

## 2015-06-05 MED ORDER — TEMAZEPAM 30 MG PO CAPS: 30.0000 mg | ORAL_CAPSULE | Freq: Every evening | ORAL | Status: DC | PRN

## 2015-06-05 NOTE — Progress Notes (Signed)
Pre visit review using our clinic review tool, if applicable. No additional management support is needed unless otherwise documented below in the visit note. 

## 2015-06-06 ENCOUNTER — Encounter: Payer: Self-pay | Admitting: Family Medicine

## 2015-06-06 NOTE — Progress Notes (Signed)
   Subjective:    Patient ID: Christina [name], female    DOB: 09/12/1965, 50 y.o.   MRN: [id number]  HPI Here to follow up. She has been doing fairly well with control of her back pain. Her BP has been stable. Glucoses have been fairly well controlled. Her last A1c was 7.1. She sees Dr. Cruzita Lederer but asks if we could refill her Glipizide until she can see her again.    Review of Systems  Constitutional: Negative.   Respiratory: Negative.   Cardiovascular: Negative.   Neurological: Negative.        Objective:   Physical Exam  Constitutional: She is oriented to person, place, and time. She appears well-developed and well-nourished.  Cardiovascular: Normal rate, regular rhythm, normal heart sounds and intact distal pulses.   Pulmonary/Chest: Effort normal and breath sounds normal.  Musculoskeletal: She exhibits no edema.  Neurological: She is alert and oriented to person, place, and time.          Assessment & Plan:  Her HTN and diabetes are stable. Refills were written. Neuropathy and back pain are stable.  Laurey Morale, MD

## 2015-06-18 ENCOUNTER — Encounter: Payer: Self-pay | Admitting: Gastroenterology

## 2015-06-18 ENCOUNTER — Ambulatory Visit (INDEPENDENT_AMBULATORY_CARE_PROVIDER_SITE_OTHER): Payer: PPO | Admitting: Gastroenterology

## 2015-06-18 VITALS — BP 140/70 | HR 80 | Ht 62.75 in | Wt 230.2 lb

## 2015-06-18 DIAGNOSIS — K641 Second degree hemorrhoids: Secondary | ICD-10-CM

## 2015-06-18 NOTE — Patient Instructions (Signed)
HEMORRHOID BANDING PROCEDURE    FOLLOW-UP CARE   1. The procedure you have had should have been relatively painless since the banding of the area involved does not have nerve endings and there is no pain sensation.  The rubber band cuts off the blood supply to the hemorrhoid and the band may fall off as soon as 48 hours after the banding (the band may occasionally be seen in the toilet bowl following a bowel movement). You may notice a temporary feeling of fullness in the rectum which should respond adequately to plain Tylenol or Motrin.  2. Following the banding, avoid strenuous exercise that evening and resume full activity the next day.  A sitz bath (soaking in a warm tub) or bidet is soothing, and can be useful for cleansing the area after bowel movements.     3. To avoid constipation, take two tablespoons of natural wheat bran, natural oat bran, flax, Benefiber or any over the counter fiber supplement and increase your water intake to 7-8 glasses daily.    4. Unless you have been prescribed anorectal medication, do not put anything inside your rectum for two weeks: No suppositories, enemas, fingers, etc.  5. Occasionally, you may have more bleeding than usual after the banding procedure.  This is often from the untreated hemorrhoids rather than the treated one.  Don't be concerned if there is a tablespoon or so of blood.  If there is more blood than this, lie flat with your bottom higher than your head and apply an ice pack to the area. If the bleeding does not stop within a half an hour or if you feel faint, call our office at [phone] or go to the emergency room.  6. Problems are not common; however, if there is a substantial amount of bleeding, severe pain, chills, fever or difficulty passing urine (very rare) or other problems, you should call us at [phone] or report to the nearest emergency room.  7. Do not stay seated continuously for more than 2-3 hours for a day or two  after the procedure.  Tighten your buttock muscles 10-15 times every two hours and take 10-15 deep breaths every 1-2 hours.  Do not spend more than a few minutes on the toilet if you cannot empty your bowel; instead re-visit the toilet at a later time.   Your 2nd banding is scheduled on 07/13/2015 Friday at 10:30am

## 2015-06-18 NOTE — Progress Notes (Signed)
PROCEDURE NOTE: The patient presents with symptomatic grade II-III hemorrhoids, requesting rubber band ligation of his/her hemorrhoidal disease.  All risks, benefits and alternative forms of therapy were described and informed consent was obtained.  In the Left Lateral Decubitus position anoscopic examination revealed grade II hemorrhoids in the right posterior and left lateral position(s).  The anorectum was pre-medicated with 0.125% nitroglycerin  The decision was made to band the right posterior internal hemorrhoid, and the [place] was used to perform band ligation without complication.  Digital anorectal examination was then performed to assure proper positioning of the band, and to adjust the banded tissue as required.  The patient was discharged home without pain or other issues.  Dietary and behavioral recommendations were given and along with follow-up instructions.     The following adjunctive treatments were recommended:   Benefiber 1 tablespoon 3 times a day  The patient will return in 2-4 weeks for  follow-up and possible additional banding as required. No complications were encountered and the patient tolerated the procedure well.

## 2015-06-30 ENCOUNTER — Other Ambulatory Visit: Payer: Self-pay | Admitting: Family Medicine

## 2015-07-02 ENCOUNTER — Encounter: Payer: PPO | Admitting: Gastroenterology

## 2015-07-13 ENCOUNTER — Encounter: Payer: Self-pay | Admitting: Gastroenterology

## 2015-07-13 ENCOUNTER — Ambulatory Visit (INDEPENDENT_AMBULATORY_CARE_PROVIDER_SITE_OTHER): Payer: PPO | Admitting: Gastroenterology

## 2015-07-13 VITALS — BP 120/70 | HR 72 | Ht 62.75 in | Wt 230.5 lb

## 2015-07-13 DIAGNOSIS — K641 Second degree hemorrhoids: Secondary | ICD-10-CM | POA: Diagnosis not present

## 2015-07-13 DIAGNOSIS — K625 Hemorrhage of anus and rectum: Secondary | ICD-10-CM

## 2015-07-13 NOTE — Patient Instructions (Signed)

## 2015-07-13 NOTE — Progress Notes (Signed)
PROCEDURE NOTE: The patient presents with symptomatic grade II  hemorrhoids, requesting rubber band ligation of his/her hemorrhoidal disease.  All risks, benefits and alternative forms of therapy were described and informed consent was obtained.  The anorectum was pre-medicated with 0.125% Nitroglycerine The decision was made to band the Right anterior internal hemorrhoid, and the [place] was used to perform band ligation without complication.  Digital anorectal examination was then performed to assure proper positioning of the band, and to adjust the banded tissue as required.  The patient was discharged home without pain or other issues.  Dietary and behavioral recommendations were given and along with follow-up instructions.     The patient will return in 2-4 weeks for  follow-up and possible additional banding as required. No complications were encountered and the patient tolerated the procedure well.  Damaris Hippo , MD [phone] Mon-Fri 8a-5p [phone] after 5p, weekends, holidays

## 2015-07-20 ENCOUNTER — Telehealth: Payer: Self-pay | Admitting: Family Medicine

## 2015-07-27 ENCOUNTER — Other Ambulatory Visit: Payer: Self-pay | Admitting: Family Medicine

## 2015-07-27 MED ORDER — HYDROMORPHONE HCL 4 MG PO TABS: 4.0000 mg | ORAL_TABLET | Freq: Four times a day (QID) | ORAL | Status: DC | PRN

## 2015-07-27 NOTE — Telephone Encounter (Signed)
Script ready for pick up, sent pt a my chart message.

## 2015-07-27 NOTE — Telephone Encounter (Signed)
Refill sent to pharmacy.   

## 2015-07-27 NOTE — Telephone Encounter (Signed)
done

## 2015-07-27 NOTE — Addendum Note (Signed)
Addended by: Alysia Penna A on: 07/27/2015 05:31 PM   Modules accepted: Orders

## 2015-08-15 ENCOUNTER — Other Ambulatory Visit: Payer: Self-pay | Admitting: Family Medicine

## 2015-08-15 NOTE — Telephone Encounter (Signed)
Refill sent to pharmacy.   

## 2015-08-21 ENCOUNTER — Other Ambulatory Visit: Payer: Self-pay | Admitting: Family Medicine

## 2015-08-24 ENCOUNTER — Other Ambulatory Visit: Payer: Self-pay | Admitting: Family Medicine

## 2015-09-14 ENCOUNTER — Encounter: Payer: PPO | Admitting: Gastroenterology

## 2015-09-18 ENCOUNTER — Other Ambulatory Visit: Payer: Self-pay | Admitting: Family Medicine

## 2015-09-25 ENCOUNTER — Other Ambulatory Visit: Payer: Self-pay | Admitting: Family Medicine

## 2015-10-10 ENCOUNTER — Other Ambulatory Visit: Payer: Self-pay | Admitting: Family Medicine

## 2015-10-12 NOTE — Telephone Encounter (Signed)
Rx refill sent to pharmacy. 

## 2015-10-16 ENCOUNTER — Encounter: Payer: PPO | Admitting: Gastroenterology

## 2015-10-24 ENCOUNTER — Other Ambulatory Visit: Payer: Self-pay | Admitting: Family Medicine

## 2015-10-26 NOTE — Telephone Encounter (Signed)
Call in #[phone] with 5 rf 

## 2015-11-06 ENCOUNTER — Telehealth: Payer: Self-pay | Admitting: *Deleted

## 2015-11-06 MED ORDER — OMEPRAZOLE 20 MG PO CPDR: 20.0000 mg | DELAYED_RELEASE_CAPSULE | Freq: Two times a day (BID) | ORAL | 3 refills | Status: DC

## 2015-11-06 NOTE — Telephone Encounter (Signed)
[facility]eprazole [facility] request

## 2015-11-12 ENCOUNTER — Other Ambulatory Visit: Payer: Self-pay | Admitting: Internal Medicine

## 2015-11-12 ENCOUNTER — Other Ambulatory Visit: Payer: Self-pay | Admitting: Family Medicine

## 2015-11-15 ENCOUNTER — Telehealth: Payer: Self-pay

## 2015-11-15 NOTE — Telephone Encounter (Signed)
Patient requesting Glipizide refill. Not seen since 03/2014, and no future appointments made. Okay to refill? Thank you!

## 2015-11-15 NOTE — Telephone Encounter (Signed)
She was lost for f/u. PCP now following DM >> she needs to have them send the Rx

## 2015-11-28 ENCOUNTER — Other Ambulatory Visit: Payer: Self-pay

## 2015-11-28 MED ORDER — METOPROLOL SUCCINATE ER 100 MG PO TB24: ORAL_TABLET | ORAL | 1 refills | Status: DC

## 2015-12-11 ENCOUNTER — Other Ambulatory Visit: Payer: Self-pay | Admitting: Family Medicine

## 2015-12-15 ENCOUNTER — Other Ambulatory Visit: Payer: Self-pay | Admitting: Family Medicine

## 2015-12-25 ENCOUNTER — Other Ambulatory Visit: Payer: Self-pay | Admitting: Family Medicine

## 2016-01-04 ENCOUNTER — Ambulatory Visit (INDEPENDENT_AMBULATORY_CARE_PROVIDER_SITE_OTHER): Payer: PPO | Admitting: Family Medicine

## 2016-01-04 ENCOUNTER — Encounter: Payer: Self-pay | Admitting: Family Medicine

## 2016-01-04 DIAGNOSIS — Z23 Encounter for immunization: Secondary | ICD-10-CM | POA: Diagnosis not present

## 2016-01-04 DIAGNOSIS — J4540 Moderate persistent asthma, uncomplicated: Secondary | ICD-10-CM | POA: Diagnosis not present

## 2016-01-04 DIAGNOSIS — E119 Type 2 diabetes mellitus without complications: Secondary | ICD-10-CM | POA: Diagnosis not present

## 2016-01-04 DIAGNOSIS — M5137 Other intervertebral disc degeneration, lumbosacral region: Secondary | ICD-10-CM

## 2016-01-04 DIAGNOSIS — I10 Essential (primary) hypertension: Secondary | ICD-10-CM | POA: Diagnosis not present

## 2016-01-04 DIAGNOSIS — E1142 Type 2 diabetes mellitus with diabetic polyneuropathy: Secondary | ICD-10-CM

## 2016-01-04 DIAGNOSIS — F411 Generalized anxiety disorder: Secondary | ICD-10-CM

## 2016-01-04 LAB — CBC WITH DIFFERENTIAL/PLATELET
BASOS PCT: 0 %
Basophils Absolute: 0 cells/uL (ref 0–200)
EOS ABS: 122 {cells}/uL (ref 15–500)
Eosinophils Relative: 2 %
HEMATOCRIT: 40.1 % (ref 35.0–45.0)
Hemoglobin: 13.2 g/dL (ref 11.7–15.5)
LYMPHS PCT: 28 %
Lymphs Abs: 1708 cells/uL (ref 850–3900)
MCH: 29.3 pg (ref 27.0–33.0)
MCHC: 32.9 g/dL (ref 32.0–36.0)
MCV: 89.1 fL (ref 80.0–100.0)
MONOS PCT: 6 %
MPV: 9.6 fL (ref 7.5–12.5)
Monocytes Absolute: 366 cells/uL (ref 200–950)
NEUTROS PCT: 64 %
Neutro Abs: 3904 cells/uL (ref 1500–7800)
PLATELETS: 167 10*3/uL (ref 140–400)
RBC: 4.5 MIL/uL (ref 3.80–5.10)
RDW: 14.7 % (ref 11.0–15.0)
WBC: 6.1 10*3/uL (ref 3.8–10.8)

## 2016-01-04 LAB — BASIC METABOLIC PANEL
BUN: 20 mg/dL (ref 7–25)
CHLORIDE: 97 mmol/L — AB (ref 98–110)
CO2: 29 mmol/L (ref 20–31)
CREATININE: 1.31 mg/dL — AB (ref 0.50–1.05)
Calcium: 8.9 mg/dL (ref 8.6–10.4)
GLUCOSE: 177 mg/dL — AB (ref 65–99)
Potassium: 4.7 mmol/L (ref 3.5–5.3)
Sodium: 137 mmol/L (ref 135–146)

## 2016-01-04 LAB — TSH: TSH: 2.88 m[IU]/L

## 2016-01-04 MED ORDER — HYDROMORPHONE HCL 4 MG PO TABS: 4.0000 mg | ORAL_TABLET | Freq: Four times a day (QID) | ORAL | 0 refills | Status: DC | PRN

## 2016-01-04 NOTE — Progress Notes (Signed)
   Subjective:    Patient ID: Christina [name], female    DOB: August 22, 1965, 50 y.o.   MRN: [id number]  HPI Here to follow up on issues like HTN and DM and obesity. She has tried for years to lose weight but has found this to be impossible. She has followed strict diets but she cannot exercise at all due to her chronic back  Pain, arthritis, and asthma. She asks if bariatric surgery is possible for her. She knows the weight contributes to her HTN, her DM, her SOB, and her joint pains. Her BP has been stable at home in the 140s over 80s. Her glucoses have been spotty, often with am fasting readings of 150-160.    Review of Systems  Constitutional: Positive for fatigue.  Respiratory: Positive for shortness of breath. Negative for cough and wheezing.   Cardiovascular: Negative.   Gastrointestinal: Negative.   Musculoskeletal: Positive for arthralgias and back pain.  Neurological: Negative.        Objective:   Physical Exam  Constitutional: She is oriented to person, place, and time.  Morbidly obese, walks with a cane   Neck: No thyromegaly present.  Cardiovascular: Normal rate, regular rhythm, normal heart sounds and intact distal pulses.   Pulmonary/Chest: Effort normal and breath sounds normal. No respiratory distress. She has no wheezes. She has no rales.  Musculoskeletal:  2+ edema to both ankles   Lymphadenopathy:    She has no cervical adenopathy.  Neurological: She is alert and oriented to person, place, and time.          Assessment & Plan:  She has chronic type 2 diabetes, HTN, asthma, and joint and back pains, and her obesity contributes to all of these. We get labs today including an A1c. I do feel she would be an appropriate candidate for bariatric surgery, and we will refer her to Surgery to consider this. Laurey Morale, MD

## 2016-01-04 NOTE — Progress Notes (Signed)
Pre visit review using our clinic review tool, if applicable. No additional management support is needed unless otherwise documented below in the visit note. 

## 2016-01-05 LAB — HEMOGLOBIN A1C
Hgb A1c MFr Bld: 9.7 % — ABNORMAL HIGH (ref <5.7)
Mean Plasma Glucose: 232 mg/dL

## 2016-01-08 ENCOUNTER — Telehealth: Payer: Self-pay | Admitting: Family Medicine

## 2016-01-08 ENCOUNTER — Other Ambulatory Visit: Payer: Self-pay | Admitting: Family Medicine

## 2016-01-08 NOTE — Telephone Encounter (Signed)
I spoke with pt and went over lab results.  

## 2016-01-08 NOTE — Telephone Encounter (Signed)
Patient returning phone call.  Contact Info: [phone]

## 2016-01-08 NOTE — Telephone Encounter (Signed)
I spoke with pt and sent over results.

## 2016-01-11 ENCOUNTER — Other Ambulatory Visit: Payer: Self-pay | Admitting: Family Medicine

## 2016-01-14 ENCOUNTER — Other Ambulatory Visit: Payer: Self-pay | Admitting: Family Medicine

## 2016-01-20 ENCOUNTER — Other Ambulatory Visit: Payer: Self-pay | Admitting: Family Medicine

## 2016-01-22 ENCOUNTER — Other Ambulatory Visit: Payer: Self-pay | Admitting: Emergency Medicine

## 2016-01-22 ENCOUNTER — Telehealth: Payer: Self-pay | Admitting: Emergency Medicine

## 2016-01-22 MED ORDER — GLIPIZIDE 10 MG PO TABS: 10.0000 mg | ORAL_TABLET | Freq: Two times a day (BID) | ORAL | 5 refills | Status: DC

## 2016-01-22 NOTE — Telephone Encounter (Signed)
According to last lab result, Glipizide was increased to 10 mg twice a day. [facility] and left a voice message for pt with this information.

## 2016-01-22 NOTE — Telephone Encounter (Signed)
Left message for patient to call back regarding refill request for Glipizide 5mg . Not sure if patient is still taking medication or not. Just needed to verify.   [facility]

## 2016-01-23 ENCOUNTER — Other Ambulatory Visit: Payer: Self-pay | Admitting: Emergency Medicine

## 2016-01-23 MED ORDER — GLIPIZIDE 10 MG PO TABS: 10.0000 mg | ORAL_TABLET | Freq: Two times a day (BID) | ORAL | 2 refills | Status: DC

## 2016-02-13 ENCOUNTER — Other Ambulatory Visit: Payer: Self-pay | Admitting: Family Medicine

## 2016-03-11 ENCOUNTER — Other Ambulatory Visit: Payer: Self-pay | Admitting: Family Medicine

## 2016-04-20 ENCOUNTER — Other Ambulatory Visit: Payer: Self-pay | Admitting: Family Medicine

## 2016-04-28 ENCOUNTER — Other Ambulatory Visit: Payer: Self-pay | Admitting: Family Medicine

## 2016-05-03 ENCOUNTER — Other Ambulatory Visit: Payer: Self-pay | Admitting: Family Medicine

## 2016-05-05 NOTE — Telephone Encounter (Signed)
Call in #[phone] with 5 rf 

## 2016-05-06 NOTE — Telephone Encounter (Signed)
Rx called in to patient's pharmacy.

## 2016-05-07 ENCOUNTER — Other Ambulatory Visit: Payer: Self-pay | Admitting: Family Medicine

## 2016-06-25 ENCOUNTER — Other Ambulatory Visit: Payer: Self-pay | Admitting: Family Medicine

## 2016-07-01 ENCOUNTER — Other Ambulatory Visit: Payer: Self-pay | Admitting: Family Medicine

## 2016-07-11 ENCOUNTER — Other Ambulatory Visit: Payer: Self-pay | Admitting: Family Medicine

## 2016-07-11 NOTE — Telephone Encounter (Signed)
Can we refill this? 

## 2016-07-14 ENCOUNTER — Other Ambulatory Visit: Payer: Self-pay | Admitting: Family Medicine

## 2016-07-14 NOTE — Telephone Encounter (Signed)
Can we refill these? 

## 2016-07-27 ENCOUNTER — Other Ambulatory Visit: Payer: Self-pay | Admitting: Family Medicine

## 2016-09-01 ENCOUNTER — Other Ambulatory Visit: Payer: Self-pay | Admitting: Family Medicine

## 2016-10-01 ENCOUNTER — Other Ambulatory Visit: Payer: Self-pay | Admitting: Family Medicine

## 2016-10-01 NOTE — Telephone Encounter (Signed)
Refill for one year 

## 2016-10-01 NOTE — Telephone Encounter (Signed)
Looks like its been awhile since filled, can we send in new script?

## 2016-10-02 NOTE — Telephone Encounter (Signed)
Just to clarify dosing that we are supposed to be sending,  On 01/08/16 the Rx was changed to 1000mg  BID due elevated A1C and the patient was advised to have follow up A1C rechecked in 90 days but this was never done. Are we keeping dose at 100mg  BID or decreasing back to the original Rx of 500mg  BID?  No follow up OV scheduled or standing labs.   Please advise, thanks.

## 2016-10-06 ENCOUNTER — Other Ambulatory Visit: Payer: Self-pay | Admitting: Family Medicine

## 2016-10-06 ENCOUNTER — Telehealth: Payer: Self-pay | Admitting: Family Medicine

## 2016-10-06 DIAGNOSIS — E119 Type 2 diabetes mellitus without complications: Secondary | ICD-10-CM

## 2016-10-06 DIAGNOSIS — Z794 Long term (current) use of insulin: Principal | ICD-10-CM

## 2016-10-06 NOTE — Telephone Encounter (Signed)
Can you call pt to schedule lab appointment? Lab orders are in computer. Must have this done before next refill on Metformin is due, just sent in a 1 month supply.

## 2016-10-06 NOTE — Telephone Encounter (Signed)
Pt has been sch for 10-10-16

## 2016-10-06 NOTE — Telephone Encounter (Signed)
I put in orders for a BMET and an A1c. Have her set up a lab appt. Call in Metformin 1000 mg bid #60

## 2016-10-06 NOTE — Telephone Encounter (Signed)
lmom for pt to call the office back

## 2016-10-06 NOTE — Progress Notes (Signed)
Call in Metformin 1000 mg Bid #60 and she needs to set up a lab appt

## 2016-10-10 ENCOUNTER — Other Ambulatory Visit: Payer: PPO

## 2016-10-10 ENCOUNTER — Ambulatory Visit: Payer: PPO | Admitting: Family Medicine

## 2016-10-16 ENCOUNTER — Encounter: Payer: Self-pay | Admitting: Family Medicine

## 2016-10-25 ENCOUNTER — Other Ambulatory Visit: Payer: Self-pay | Admitting: Family Medicine

## 2016-10-29 ENCOUNTER — Encounter: Payer: Self-pay | Admitting: Family Medicine

## 2016-10-29 ENCOUNTER — Ambulatory Visit (INDEPENDENT_AMBULATORY_CARE_PROVIDER_SITE_OTHER): Payer: PPO | Admitting: Family Medicine

## 2016-10-29 VITALS — BP 145/82 | HR 61 | Temp 97.9°F | Ht 62.75 in | Wt 223.0 lb

## 2016-10-29 DIAGNOSIS — L409 Psoriasis, unspecified: Secondary | ICD-10-CM

## 2016-10-29 DIAGNOSIS — E119 Type 2 diabetes mellitus without complications: Secondary | ICD-10-CM

## 2016-10-29 DIAGNOSIS — K219 Gastro-esophageal reflux disease without esophagitis: Secondary | ICD-10-CM | POA: Diagnosis not present

## 2016-10-29 DIAGNOSIS — Z794 Long term (current) use of insulin: Secondary | ICD-10-CM

## 2016-10-29 DIAGNOSIS — G8929 Other chronic pain: Secondary | ICD-10-CM

## 2016-10-29 DIAGNOSIS — E1165 Type 2 diabetes mellitus with hyperglycemia: Secondary | ICD-10-CM

## 2016-10-29 DIAGNOSIS — M5440 Lumbago with sciatica, unspecified side: Secondary | ICD-10-CM | POA: Diagnosis not present

## 2016-10-29 DIAGNOSIS — Z23 Encounter for immunization: Secondary | ICD-10-CM | POA: Diagnosis not present

## 2016-10-29 DIAGNOSIS — J4520 Mild intermittent asthma, uncomplicated: Secondary | ICD-10-CM | POA: Diagnosis not present

## 2016-10-29 MED ORDER — CLOBETASOL PROPIONATE 0.05 % EX SOLN: 1.0000 "application " | Freq: Two times a day (BID) | CUTANEOUS | 5 refills | Status: DC

## 2016-10-29 MED ORDER — TRIAMCINOLONE ACETONIDE 0.1 % EX CREA: 1.0000 "application " | TOPICAL_CREAM | Freq: Two times a day (BID) | CUTANEOUS | 5 refills | Status: DC

## 2016-10-29 MED ORDER — TRAMADOL HCL 50 MG PO TABS: ORAL_TABLET | ORAL | 5 refills | Status: DC

## 2016-10-29 MED ORDER — HYDROMORPHONE HCL 4 MG PO TABS: 4.0000 mg | ORAL_TABLET | Freq: Four times a day (QID) | ORAL | 0 refills | Status: DC | PRN

## 2016-10-29 MED ORDER — SERTRALINE HCL 100 MG PO TABS: 150.0000 mg | ORAL_TABLET | Freq: Every day | ORAL | 3 refills | Status: DC

## 2016-10-29 NOTE — Patient Instructions (Signed)
WE NOW OFFER   [facility] FAST TRACK!!!  SAME DAY Appointments for ACUTE CARE  Such as: Sprains, Injuries, cuts, abrasions, rashes, muscle pain, joint pain, back pain Colds, flu, sore throats, headache, allergies, cough, fever  Ear pain, sinus and eye infections Abdominal pain, nausea, vomiting, diarrhea, upset stomach Animal/insect bites  3 Easy Ways to Schedule: Walk-In Scheduling Call in scheduling Mychart Sign-up: [email or web address]         

## 2016-10-29 NOTE — Progress Notes (Signed)
   Subjective:    Patient ID: Christina [name], female    DOB: September 15, 1965, 51 y.o.   MRN: [id number]  HPI Here to follow up and to ask about a rash on the scalp and face that started about 2 months ago. This burns and itches. Her skin peels and flakes. Dandruff shampoos do not help. Her BP has been stable. AM fasting glucoses average around 120. She does say her depression and worsened a bit lately. She has been on her current dose of Zoloft (100 mg daily) for years now.    Review of Systems  Constitutional: Negative.   Respiratory: Negative.   Cardiovascular: Negative.   Gastrointestinal: Negative.   Neurological: Negative.   Psychiatric/Behavioral: Positive for dysphoric mood. Negative for agitation, behavioral problems, confusion and hallucinations. The patient is nervous/anxious.        Objective:   Physical Exam  Constitutional: She is oriented to person, place, and time. She appears well-developed and well-nourished.  Neck: No thyromegaly present.  Cardiovascular: Normal rate, regular rhythm, normal heart sounds and intact distal pulses.   Pulmonary/Chest: Effort normal and breath sounds normal. No respiratory distress. She has no wheezes. She has no rales.  Musculoskeletal: She exhibits no edema.  Lymphadenopathy:    She has no cervical adenopathy.  Neurological: She is alert and oriented to person, place, and time.  Skin:  Her face is clear but the scalp has areas of redness and flaking           Assessment & Plan:  She has some psoriasis on the scalp and face. Try Clobetasol solution on the scalp and Triamcinolone cream on the face. Her HTN is stable. DM is stable. We will get labs today including ann A1c and a BMET. GERD and asthma are stable. For the depression we will increase the Zoloft to 150 mg daily.  Alysia Penna, MD

## 2016-10-30 LAB — BASIC METABOLIC PANEL
BUN: 23 mg/dL (ref 6–23)
CALCIUM: 9.1 mg/dL (ref 8.4–10.5)
CO2: 31 meq/L (ref 19–32)
CREATININE: 1.25 mg/dL — AB (ref 0.40–1.20)
Chloride: 99 mEq/L (ref 96–112)
GFR: 47.8 mL/min — AB (ref >60.00)
GLUCOSE: 110 mg/dL — AB (ref 70–99)
Potassium: 4.6 mEq/L (ref 3.5–5.1)
Sodium: 138 mEq/L (ref 135–145)

## 2016-10-30 LAB — HEMOGLOBIN A1C: Hgb A1c MFr Bld: 6.8 % — ABNORMAL HIGH (ref 4.6–6.5)

## 2016-11-09 ENCOUNTER — Other Ambulatory Visit: Payer: Self-pay | Admitting: Family Medicine

## 2016-11-11 ENCOUNTER — Other Ambulatory Visit: Payer: Self-pay | Admitting: Family Medicine

## 2016-11-16 ENCOUNTER — Other Ambulatory Visit: Payer: Self-pay | Admitting: Family Medicine

## 2016-11-27 ENCOUNTER — Other Ambulatory Visit: Payer: Self-pay | Admitting: Family Medicine

## 2016-12-25 ENCOUNTER — Other Ambulatory Visit: Payer: Self-pay | Admitting: Gastroenterology

## 2016-12-30 ENCOUNTER — Telehealth: Payer: Self-pay | Admitting: Family Medicine

## 2016-12-30 NOTE — Telephone Encounter (Signed)
Copied from [place]. Topic: General - Other >> Dec 30, 2016  3:41 PM Conception Chancy, NT wrote: Reason for CRM:  patient state she was seen in October and was prescribed -- metformin; hydromorphone; and sertraline and can not find those prescriptions anywhere and is wanting to know if they can be sent back in. Please contact patient on what to do if these can not be sent in.

## 2016-12-31 NOTE — Telephone Encounter (Signed)
Called pt and left a VM that her [facility] [address] [place] and the Hydromorphone was given to her as a written Rx back in October. I suggested for the pt to [facility] to find out if [facility] the metformin and zoloft.

## 2017-01-09 ENCOUNTER — Other Ambulatory Visit: Payer: Self-pay

## 2017-01-09 MED ORDER — METFORMIN HCL ER 500 MG PO TB24: ORAL_TABLET | ORAL | 5 refills | Status: DC

## 2017-01-15 ENCOUNTER — Telehealth: Payer: Self-pay | Admitting: *Deleted

## 2017-01-15 NOTE — Telephone Encounter (Signed)
[facility] recommends patient be prescribed a statin due to having a diagnosis of diabetes. If you agree, please advise medication and I will contact patient and send to pharmacy.  Thanks! 

## 2017-01-16 NOTE — Telephone Encounter (Signed)
Call in Lipitor 10 mg daily, #90 with 3 rf. Recheck lipids and a liver panel in 90 days

## 2017-01-19 MED ORDER — ATORVASTATIN CALCIUM 10 MG PO TABS: 10.0000 mg | ORAL_TABLET | Freq: Every day | ORAL | 3 refills | Status: DC

## 2017-01-19 NOTE — Telephone Encounter (Signed)
Pt notified of instructions and verbalized understanding. 

## 2017-01-19 NOTE — Telephone Encounter (Signed)
Patient is agreeable to starting statin. Medication filled to pharmacy as requested.  She is coming in for appt in March and will have labs done at that time.  Should she continue taking fenofibrate once she starts lipitor?

## 2017-01-19 NOTE — Telephone Encounter (Signed)
Yes continue the fenofibrate

## 2017-01-19 NOTE — Telephone Encounter (Signed)
Called patient and left message to return call

## 2017-02-04 ENCOUNTER — Ambulatory Visit: Payer: PPO | Admitting: Family Medicine

## 2017-02-04 ENCOUNTER — Encounter: Payer: Self-pay | Admitting: Family Medicine

## 2017-02-04 DIAGNOSIS — M65331 Trigger finger, right middle finger: Secondary | ICD-10-CM | POA: Diagnosis not present

## 2017-02-04 DIAGNOSIS — R21 Rash and other nonspecific skin eruption: Secondary | ICD-10-CM | POA: Diagnosis not present

## 2017-02-04 MED ORDER — HYDROMORPHONE HCL 4 MG PO TABS: 4.0000 mg | ORAL_TABLET | Freq: Four times a day (QID) | ORAL | 0 refills | Status: DC | PRN

## 2017-02-04 MED ORDER — METFORMIN HCL ER 500 MG PO TB24: ORAL_TABLET | ORAL | 3 refills | Status: DC

## 2017-02-04 MED ORDER — SERTRALINE HCL 100 MG PO TABS: 150.0000 mg | ORAL_TABLET | Freq: Every day | ORAL | 3 refills | Status: DC

## 2017-02-04 NOTE — Progress Notes (Signed)
   Subjective:    Patient ID: Christina [name], female    DOB: 10/15/1965, 52 y.o.   MRN: [id number]  HPI Here for several issues. First she wants to pursue the possibility of bariatric surgery. We spoke about this last year but did not fully pursue it. She cannot walk without assistance and she has chronic back pain, all in part due to her obesity. This also affects her diabetes and HTN. Second for the past month she has had trouble with the 3rd finger on the right hand getting stuck in a flexed position. She often has to use her other hand to pop the finger back out straight. Third she continues to have facial rashes that burn and itch.   Review of Systems  Constitutional: Negative.   Respiratory: Negative.   Cardiovascular: Negative.   Musculoskeletal: Positive for arthralgias and back pain.  Skin: Positive for rash.       Objective:   Physical Exam  Constitutional: She is oriented to person, place, and time.  Morbidly obese. In a wheelchair   Cardiovascular: Normal rate, regular rhythm, normal heart sounds and intact distal pulses.  Pulmonary/Chest: Effort normal and breath sounds normal. No respiratory distress. She has no wheezes. She has no rales.  Musculoskeletal:  The right hand appears normal   Neurological: She is alert and oriented to person, place, and time.          Assessment & Plan:  For the obesity, we will refer her to Bariatric Surgery to evaluate. For the trigger finger, refer to Hand Surgery. For the facial rash, refer to Dermatology. Alysia Penna, MD

## 2017-03-05 DIAGNOSIS — M65331 Trigger finger, right middle finger: Secondary | ICD-10-CM | POA: Diagnosis not present

## 2017-03-24 ENCOUNTER — Encounter: Payer: Self-pay | Admitting: Family Medicine

## 2017-03-25 NOTE — Telephone Encounter (Signed)
We faxed this to them today

## 2017-04-01 ENCOUNTER — Ambulatory Visit (INDEPENDENT_AMBULATORY_CARE_PROVIDER_SITE_OTHER): Payer: PPO | Admitting: Family Medicine

## 2017-04-01 ENCOUNTER — Encounter: Payer: Self-pay | Admitting: Family Medicine

## 2017-04-01 VITALS — BP 126/70 | HR 61 | Temp 98.1°F | Ht 62.75 in | Wt 224.8 lb

## 2017-04-01 DIAGNOSIS — Z0000 Encounter for general adult medical examination without abnormal findings: Secondary | ICD-10-CM | POA: Diagnosis not present

## 2017-04-01 DIAGNOSIS — Z23 Encounter for immunization: Secondary | ICD-10-CM | POA: Diagnosis not present

## 2017-04-01 DIAGNOSIS — E1165 Type 2 diabetes mellitus with hyperglycemia: Secondary | ICD-10-CM | POA: Diagnosis not present

## 2017-04-01 LAB — POC URINALSYSI DIPSTICK (AUTOMATED)
Bilirubin, UA: NEGATIVE
Blood, UA: NEGATIVE
GLUCOSE UA: NEGATIVE
Ketones, UA: NEGATIVE
Leukocytes, UA: NEGATIVE
Nitrite, UA: NEGATIVE
PROTEIN UA: NEGATIVE
SPEC GRAV UA: 1.025 (ref 1.010–1.025)
Urobilinogen, UA: 0.2 E.U./dL
pH, UA: 6 (ref 5.0–8.0)

## 2017-04-01 NOTE — Progress Notes (Signed)
   Subjective:    Patient ID: Christina [name], female    DOB: Aug 26, 1965, 52 y.o.   MRN: [id number]  HPI Here for a well exam. She feels well in general. She has an upcoming surgery with Dr. Apolonio Schneiders to repair the trigger finger. She is talking to [facility] to look into bariatric surgery. Her BP is stable.   Review of Systems  Constitutional: Negative.   HENT: Negative.   Eyes: Negative.   Respiratory: Negative.   Cardiovascular: Negative.   Gastrointestinal: Negative.   Genitourinary: Negative for decreased urine volume, difficulty urinating, dyspareunia, dysuria, enuresis, flank pain, frequency, hematuria, pelvic pain and urgency.  Musculoskeletal: Negative.   Skin: Negative.   Neurological: Negative.   Psychiatric/Behavioral: Negative.        Objective:   Physical Exam  Constitutional: She is oriented to person, place, and time. She appears well-developed and well-nourished. No distress.  HENT:  Head: Normocephalic and atraumatic.  Right Ear: External ear normal.  Left Ear: External ear normal.  Nose: Nose normal.  Mouth/Throat: Oropharynx is clear and moist. No oropharyngeal exudate.  Eyes: Conjunctivae and EOM are normal. Pupils are equal, round, and reactive to light. No scleral icterus.  Neck: Normal range of motion. Neck supple. No JVD present. No thyromegaly present.  Cardiovascular: Normal rate, regular rhythm, normal heart sounds and intact distal pulses. Exam reveals no gallop and no friction rub.  No murmur heard. Pulmonary/Chest: Effort normal and breath sounds normal. No respiratory distress. She has no wheezes. She has no rales. She exhibits no tenderness.  Abdominal: Soft. Bowel sounds are normal. She exhibits no distension and no mass. There is no tenderness. There is no rebound and no guarding.  Musculoskeletal: Normal range of motion. She exhibits no edema or tenderness.  Lymphadenopathy:    She has no cervical adenopathy.  Neurological: She is alert  and oriented to person, place, and time. She has normal reflexes. No cranial nerve deficit. She exhibits normal muscle tone. Coordination normal.  Skin: Skin is warm and dry. No rash noted. No erythema.  Psychiatric: She has a normal mood and affect. Her behavior is normal. Judgment and thought content normal.          Assessment & Plan:  Well exam. We discussed diet and exercise. Get fasting labs today. She is given a TDaP. She is past due for a mammogram. Alysia Penna, MD

## 2017-04-02 ENCOUNTER — Other Ambulatory Visit: Payer: Self-pay | Admitting: Family Medicine

## 2017-04-02 LAB — LIPID PANEL
CHOLESTEROL: 164 mg/dL (ref 0–200)
HDL: 48.7 mg/dL (ref >39.00)
LDL Cholesterol: 76 mg/dL (ref 0–99)
NonHDL: 115.63
Total CHOL/HDL Ratio: 3
Triglycerides: 199 mg/dL — ABNORMAL HIGH (ref 0.0–149.0)
VLDL: 39.8 mg/dL (ref 0.0–40.0)

## 2017-04-02 LAB — CBC WITH DIFFERENTIAL/PLATELET
BASOS ABS: 0 10*3/uL (ref 0.0–0.1)
Basophils Relative: 0.4 % (ref 0.0–3.0)
EOS PCT: 1.7 % (ref 0.0–5.0)
Eosinophils Absolute: 0.1 10*3/uL (ref 0.0–0.7)
HCT: 38.4 % (ref 36.0–46.0)
HEMOGLOBIN: 12.6 g/dL (ref 12.0–15.0)
LYMPHS PCT: 31.1 % (ref 12.0–46.0)
Lymphs Abs: 1.5 10*3/uL (ref 0.7–4.0)
MCHC: 32.7 g/dL (ref 30.0–36.0)
MCV: 84.1 fl (ref 78.0–100.0)
MONOS PCT: 4.4 % (ref 3.0–12.0)
Monocytes Absolute: 0.2 10*3/uL (ref 0.1–1.0)
Neutro Abs: 3.1 10*3/uL (ref 1.4–7.7)
Neutrophils Relative %: 62.4 % (ref 43.0–77.0)
Platelets: 163 10*3/uL (ref 150.0–400.0)
RBC: 4.57 Mil/uL (ref 3.87–5.11)
RDW: 14.9 % (ref 11.5–15.5)
WBC: 5 10*3/uL (ref 4.0–10.5)

## 2017-04-02 LAB — HEPATIC FUNCTION PANEL
ALBUMIN: 4.2 g/dL (ref 3.5–5.2)
ALK PHOS: 41 U/L (ref 39–117)
ALT: 26 U/L (ref 0–35)
AST: 25 U/L (ref 0–37)
Bilirubin, Direct: 0.1 mg/dL (ref 0.0–0.3)
Total Bilirubin: 0.4 mg/dL (ref 0.2–1.2)
Total Protein: 6.7 g/dL (ref 6.0–8.3)

## 2017-04-02 LAB — BASIC METABOLIC PANEL
BUN: 25 mg/dL — ABNORMAL HIGH (ref 6–23)
CO2: 31 mEq/L (ref 19–32)
Calcium: 9.3 mg/dL (ref 8.4–10.5)
Chloride: 99 mEq/L (ref 96–112)
Creatinine, Ser: 1.19 mg/dL (ref 0.40–1.20)
GFR: 50.51 mL/min — ABNORMAL LOW (ref >60.00)
GLUCOSE: 88 mg/dL (ref 70–99)
POTASSIUM: 4.8 meq/L (ref 3.5–5.1)
SODIUM: 138 meq/L (ref 135–145)

## 2017-04-02 LAB — HEMOGLOBIN A1C: HEMOGLOBIN A1C: 7.7 % — AB (ref 4.6–6.5)

## 2017-04-02 LAB — TSH: TSH: 1.98 u[IU]/mL (ref 0.35–4.50)

## 2017-04-03 LAB — ANA: Anti Nuclear Antibody(ANA): NEGATIVE

## 2017-04-03 NOTE — Telephone Encounter (Signed)
Last OV 04/01/2017   Rx was last refilled 02/18/2016 disp 60 with 6 refills   Sent to PCP for approval

## 2017-04-10 ENCOUNTER — Encounter: Payer: PPO | Admitting: Family Medicine

## 2017-04-15 DIAGNOSIS — M65841 Other synovitis and tenosynovitis, right hand: Secondary | ICD-10-CM | POA: Diagnosis not present

## 2017-04-15 DIAGNOSIS — M65331 Trigger finger, right middle finger: Secondary | ICD-10-CM | POA: Diagnosis not present

## 2017-05-20 ENCOUNTER — Other Ambulatory Visit: Payer: Self-pay | Admitting: Family Medicine

## 2017-05-20 NOTE — Telephone Encounter (Signed)
Pt advised that PCP is out of the office until Monday pt is Ok with waiting until PCP is back in the office.   Pt has enough medication to last her.

## 2017-05-21 NOTE — Telephone Encounter (Signed)
Sent to PCP to advise 

## 2017-07-12 ENCOUNTER — Other Ambulatory Visit: Payer: Self-pay | Admitting: Family Medicine

## 2017-07-13 NOTE — Telephone Encounter (Signed)
Last OV   Tramadol last refilled 10/29/2016 disp 180 with 5 refills   klor-con last refilled 07/14/2016 disp 90 with 3 refills   cardizem last refilled 07/11/2016 disp 90 with 3 refills  Sent to PCP for approval for Tramadol

## 2017-07-18 ENCOUNTER — Other Ambulatory Visit: Payer: Self-pay | Admitting: Family Medicine

## 2017-07-20 NOTE — Telephone Encounter (Signed)
Last OV 04/01/2017

## 2017-07-28 ENCOUNTER — Other Ambulatory Visit: Payer: Self-pay | Admitting: Family Medicine

## 2017-07-28 NOTE — Telephone Encounter (Signed)
Last OV 04/01/2017   Last refilled 11/10/2016 disp 90 with 1 refill   Sent to PCP for approval

## 2017-08-13 ENCOUNTER — Other Ambulatory Visit: Payer: Self-pay | Admitting: Family Medicine

## 2017-08-14 NOTE — Telephone Encounter (Signed)
Last OV 04/01/2017   Last refilled 07/28/2016 disp 90 with no refills    Sent to PCP for approval

## 2017-09-28 ENCOUNTER — Other Ambulatory Visit: Payer: Self-pay | Admitting: Gastroenterology

## 2017-10-22 ENCOUNTER — Other Ambulatory Visit: Payer: Self-pay | Admitting: Family Medicine

## 2017-11-09 ENCOUNTER — Other Ambulatory Visit: Payer: Self-pay | Admitting: Family Medicine

## 2017-11-10 NOTE — Telephone Encounter (Signed)
Dr. Fry please advise on refill. Thanks  

## 2017-12-11 ENCOUNTER — Other Ambulatory Visit: Payer: Self-pay | Admitting: Family Medicine

## 2017-12-14 ENCOUNTER — Other Ambulatory Visit: Payer: Self-pay | Admitting: Family Medicine

## 2017-12-17 MED ORDER — DICLOFENAC SODIUM 75 MG PO TBEC: DELAYED_RELEASE_TABLET | ORAL | 4 refills | Status: DC

## 2017-12-22 ENCOUNTER — Other Ambulatory Visit: Payer: Self-pay | Admitting: Family Medicine

## 2017-12-22 MED ORDER — SERTRALINE HCL 100 MG PO TABS: 150.0000 mg | ORAL_TABLET | Freq: Every day | ORAL | 0 refills | Status: DC

## 2017-12-22 NOTE — Telephone Encounter (Signed)
Copied from [place] [id number]. Topic: Quick Communication - Rx Refill/Question >> Dec 22, 2017  8:16 AM Alfredia Ferguson R wrote: Medication: sertraline (ZOLOFT) 100 MG tablet  Has the patient contacted their pharmacy? Yes Preferred Pharmacy (with phone number or street name):  [id number] - [place], [place]. [address] [phone] (Phone) [phone] (Fax)    Agent: Please be advised that RX refills may take up to 3 business days. We ask that you follow-up with your pharmacy.

## 2017-12-22 NOTE — Telephone Encounter (Signed)
Requested Prescriptions  Pending Prescriptions Disp Refills  . sertraline (ZOLOFT) 100 MG tablet 135 tablet 0    Sig: Take 1.5 tablets (150 mg total) by mouth daily.     Psychiatry:  Antidepressants - SSRI Failed - 12/22/2017  1:05 PM      Failed - Valid encounter within last 6 months    Recent Outpatient Visits          8 months ago Preventative health care   [facility] at [place], Ishmael Holter, MD   10 months ago Morbid obesity [facility])   [place] at [place], Ishmael Holter, MD   1 year ago [place] at [place], Ishmael Holter, MD   1 year ago Morbid obesity [facility])   [place] at [place], Ishmael Holter, MD   2 years ago Diabetes mellitus without complication [facility])   [place] at [facility], Ishmael Holter, MD      Future Appointments            Tomorrow Laurey Morale, MD [place] at [place], [facility]

## 2017-12-23 ENCOUNTER — Encounter: Payer: Self-pay | Admitting: Family Medicine

## 2017-12-23 ENCOUNTER — Ambulatory Visit (INDEPENDENT_AMBULATORY_CARE_PROVIDER_SITE_OTHER): Payer: PPO | Admitting: Family Medicine

## 2017-12-23 VITALS — BP 142/84 | HR 85 | Temp 98.4°F | Wt 219.2 lb

## 2017-12-23 DIAGNOSIS — M7661 Achilles tendinitis, right leg: Secondary | ICD-10-CM

## 2017-12-23 DIAGNOSIS — E1165 Type 2 diabetes mellitus with hyperglycemia: Secondary | ICD-10-CM

## 2017-12-23 DIAGNOSIS — I10 Essential (primary) hypertension: Secondary | ICD-10-CM

## 2017-12-23 DIAGNOSIS — L409 Psoriasis, unspecified: Secondary | ICD-10-CM | POA: Diagnosis not present

## 2017-12-23 DIAGNOSIS — Z23 Encounter for immunization: Secondary | ICD-10-CM

## 2017-12-23 DIAGNOSIS — M7121 Synovial cyst of popliteal space [Baker], right knee: Secondary | ICD-10-CM | POA: Diagnosis not present

## 2017-12-23 LAB — HEMOGLOBIN A1C: HEMOGLOBIN A1C: 8 % — AB (ref 4.6–6.5)

## 2017-12-23 MED ORDER — CLOBETASOL PROPIONATE 0.05 % EX SHAM: 1.0000 "application " | MEDICATED_SHAMPOO | Freq: Every day | CUTANEOUS | 5 refills | Status: DC

## 2017-12-23 NOTE — Progress Notes (Signed)
   Subjective:    Patient ID: Christina [name], female    DOB: 02/24/1965, 52 y.o.   MRN: [id number]  HPI Here for several issues. First she has had a tender lump behind the right heel for a year or more, and it has become painful for her to walk on the right foot. She does wear supportive shoes with good arch supports. Also for several months she has noticed a lump behind the right knee, but this is not painful. She also complains about itching on her scalp with some flaking. She does not check her glucoses very often though I ave asked her to. Her last A1c in March was up a bit to 7.4.    Review of Systems  Constitutional: Negative.   Respiratory: Negative.   Cardiovascular: Negative.   Musculoskeletal: Positive for arthralgias.  Neurological: Negative.        Objective:   Physical Exam  Constitutional: She is oriented to person, place, and time.  In a wheelchair   Cardiovascular: Normal rate, regular rhythm, normal heart sounds and intact distal pulses.  Pulmonary/Chest: Effort normal and breath sounds normal.  Musculoskeletal:  There is a small fluctuant non-tender lump in the right popliteal space. There is a firm knot behind the right heel at the insertion of the Achilles tendon that is quite tender   Neurological: She is alert and oriented to person, place, and time.  Skin:  Her scalp has scattered areas of pink scaly skin           Assessment & Plan:  Her HTN is stable. For the diabetes we will send her for an A1c today. She will treat the psoriasis on her scalp with Clobetasol shampoo daily. She has a right Baker's cyst and this does not require treatment at this time. She as a right Achilles tendonitis so we will refer her to Orthopedics. She may use ice and Ibuprofen prn.  Alysia Penna, MD

## 2018-01-01 ENCOUNTER — Encounter (INDEPENDENT_AMBULATORY_CARE_PROVIDER_SITE_OTHER): Payer: Self-pay | Admitting: Orthopaedic Surgery

## 2018-01-01 ENCOUNTER — Ambulatory Visit (INDEPENDENT_AMBULATORY_CARE_PROVIDER_SITE_OTHER): Payer: PPO

## 2018-01-01 ENCOUNTER — Ambulatory Visit (INDEPENDENT_AMBULATORY_CARE_PROVIDER_SITE_OTHER): Payer: PPO | Admitting: Orthopaedic Surgery

## 2018-01-01 ENCOUNTER — Ambulatory Visit (HOSPITAL_COMMUNITY)
Admission: RE | Admit: 2018-01-01 | Discharge: 2018-01-01 | Disposition: A | Discharge status: Home / Self Care | Payer: PPO | Source: Ambulatory Visit | Attending: Orthopaedic Surgery | Admitting: Orthopaedic Surgery

## 2018-01-01 DIAGNOSIS — S86011A Strain of right Achilles tendon, initial encounter: Secondary | ICD-10-CM | POA: Diagnosis not present

## 2018-01-01 DIAGNOSIS — M79671 Pain in right foot: Secondary | ICD-10-CM | POA: Diagnosis not present

## 2018-01-01 DIAGNOSIS — M25471 Effusion, right ankle: Secondary | ICD-10-CM | POA: Insufficient documentation

## 2018-01-01 DIAGNOSIS — S93491A Sprain of other ligament of right ankle, initial encounter: Secondary | ICD-10-CM | POA: Diagnosis not present

## 2018-01-01 DIAGNOSIS — M722 Plantar fascial fibromatosis: Secondary | ICD-10-CM | POA: Diagnosis not present

## 2018-01-01 DIAGNOSIS — R609 Edema, unspecified: Secondary | ICD-10-CM | POA: Insufficient documentation

## 2018-01-01 DIAGNOSIS — X58XXXA Exposure to other specified factors, initial encounter: Secondary | ICD-10-CM | POA: Insufficient documentation

## 2018-01-01 NOTE — Addendum Note (Signed)
Addended by: Precious Bard on: 01/01/2018 09:01 AM   Modules accepted: Orders

## 2018-01-01 NOTE — Progress Notes (Signed)
Office Visit Note   Patient: Christina [name]           Date of Birth: [birth year]           MRN: [id number] Visit Date: 01/01/2018              Requested by: Laurey Morale, MD [place], [place] [address] PCP: Laurey Morale, MD   Assessment & Plan: Visit Diagnoses:  1. Pain of right heel     Plan: My suspicion is that she has acutely torn her right Achilles tendon from the insertion.  At this point we will place her in a plantarflexed splint.  We will obtain an urgent MRI to evaluate for the severity of the rupture.  She is diabetic with her previous hemoglobin A1c of 8.0.  This has been trending upwards.  Follow-Up Instructions: Return in about 10 days (around 01/11/2018).   Orders:  Orders Placed This Encounter  Procedures  . DG Os [place] Right  . XR Os [place]cis Right   No orders of the defined types were placed in this encounter.     Procedures: No procedures performed   Clinical Data: No additional findings.   Subjective: Chief Complaint  Patient presents with  . Right Knee - Pain    Christina [name] is a 52 year old female who has chronic back pain and is a household ambulator who comes in with acute right heel pain since 2 days ago.  She felt like her leg gave out and she felt a pop in the back of her heel.  Since then she is has been unable to ambulate.  She normally only ambulates short distances with a walker at home.  She does endorse swelling and discomfort in her right foot and heel.   Review of Systems  Constitutional: Negative.   HENT: Negative.   Eyes: Negative.   Respiratory: Negative.   Cardiovascular: Negative.   Endocrine: Negative.   Musculoskeletal: Negative.   Neurological: Negative.   Hematological: Negative.   Psychiatric/Behavioral: Negative.   All other systems reviewed and are negative.    Objective: Vital Signs: There were no vitals taken for this visit.  Physical Exam  Constitutional: She is oriented to person, place,  and time. She appears well-developed and well-nourished.  HENT:  Head: Normocephalic and atraumatic.  Eyes: EOM are normal.  Neck: Neck supple.  Pulmonary/Chest: Effort normal.  Abdominal: Soft.  Neurological: She is alert and oriented to person, place, and time.  Skin: Skin is warm. Capillary refill takes less than 2 seconds.  Psychiatric: She has a normal mood and affect. Her behavior is normal. Judgment and thought content normal.  Nursing note and vitals reviewed.   Ortho Exam Right heel exam shows tenderness at the calcaneal tuberosity.  She does have swelling of her right foot which is mild.  No neurovascular compromise.  Compared to the contralateral side it feels like there is a little bit of a divot in her Achilles tendon.  She has an abnormal Thompson's test.  Specialty Comments:  No specialty comments available.  Imaging: No results found.   PMFS History: Patient Active Problem List   Diagnosis Date Noted  . Psoriasis 10/29/2016  . Morbid obesity ([place]) 01/04/2016  . Hypothyroidism 04/05/2014  . Type 2 diabetes mellitus, uncontrolled ([place]) 03/06/2014  . NECK PAIN 11/28/2009  . BRACHIAL NEURITIS OR RADICULITIS NOS 11/28/2009  . VITAMIN B12 DEFICIENCY 09/14/2009  . HYPERTRIGLYCERIDEMIA 09/14/2009  . Diabetic neuropathy ([place]) 09/11/2009  .  ECZEMA 06/07/2009  . Diabetes mellitus without complication ([place]) 15/40/0867  . ALLERGIC URTICARIA 05/02/2009  . ANGIOEDEMA 05/02/2009  . SKIN LESION 11/27/2008  . Anxiety state 07/26/2008  . ABDOMINAL PAIN, GENERALIZED 04/03/2008  . [place] DISEASE, LUMBAR 06/22/2007  . ARTHRITIS 06/11/2007  . LOW BACK PAIN 12/28/2006  . Essential hypertension 09/11/2006  . Asthma 09/11/2006  . GERD 09/11/2006  . History of colonic polyps 09/11/2006   Past Medical History:  Diagnosis Date  . Allergy   . Angioedema   . Asthma   . Blood transfusion without reported diagnosis    with both vaginal deliveries bleed out and had blood  . DDD  (degenerative disc disease), lumbar   . Diabetes mellitus    sees Dr. Cruzita Lederer   . Eczema   . GERD (gastroesophageal reflux disease)   . Hx of colonic polyps   . Hyperlipidemia   . Hypertension   . Interstitial cystitis    per Dr. Risa Grill  . Low back pain   . Meningitis   . Seizures ([place])    seizures with brain tumor-no sz since 1984  . Thyroid disease     Family History  Problem Relation Age of Onset  . Hypertension Unknown        family hx  . Stroke Mother   . Rheum arthritis Mother   . COPD Mother   . Stroke Father   . Colon cancer Father   . Colon polyps Neg Hx     Past Surgical History:  Procedure Laterality Date  . ABDOMINAL HYSTERECTOMY    . BRAIN TUMOR EXCISION     benign 1984  . CHOLECYSTECTOMY    . COLONOSCOPY  05/02/2015   per Dr. Silverio Decamp, internal hemorrhoids but no polyps, repeat in 10 yrs   . cystic mass  4.4.11   remval from abdomen per Dr. Kendell Bane at [facility]  . POLYPECTOMY    . SVD X 2    . TONSILLECTOMY    . TUBAL LIGATION     bilateral   Social History   Occupational History  . Not on file  Tobacco Use  . Smoking status: Former Research scientist (life sciences)  . Smokeless tobacco: Never Used  Substance and Sexual Activity  . Alcohol use: No    Alcohol/week: 0.0 standard drinks  . Drug use: No  . Sexual activity: Not on file

## 2018-01-03 NOTE — Progress Notes (Signed)
Please have her come in to see me on Tuesday to discuss MRI results and treatment options

## 2018-01-06 ENCOUNTER — Ambulatory Visit (INDEPENDENT_AMBULATORY_CARE_PROVIDER_SITE_OTHER): Payer: PPO | Admitting: Orthopaedic Surgery

## 2018-01-06 DIAGNOSIS — S86011A Strain of right Achilles tendon, initial encounter: Secondary | ICD-10-CM

## 2018-01-06 DIAGNOSIS — M9261 Juvenile osteochondrosis of tarsus, right ankle: Secondary | ICD-10-CM

## 2018-01-06 NOTE — Progress Notes (Signed)
Office Visit Note   Patient: Christina [name]           Date of Birth: 06-20-1965           MRN: [id number] Visit Date: 01/06/2018              Requested by: Laurey Morale, MD [place], [place] [address] PCP: Laurey Morale, MD   Assessment & Plan: Visit Diagnoses:  1. Rupture of right Achilles tendon, initial encounter   2. Haglund's deformity of right heel     Plan: MRI findings were reviewed with the patient and her husband today.  She has an acute rupture of her Achilles from the insertion of the calcaneus with 5 cm of retraction.  Given the significant amount of retraction I recommend surgical repair as well as correction of her Haglund deformity as soon as possible.  Risks and benefits and rehab recovery reviewed today.  Questions encouraged and answered.  Follow-Up Instructions: Return for 2 week postop visit.   Orders:  No orders of the defined types were placed in this encounter.  No orders of the defined types were placed in this encounter.     Procedures: No procedures performed   Clinical Data: No additional findings.   Subjective: Chief Complaint  Patient presents with  . Right Ankle - Follow-up    MRI review    Christina [name] follows up today for MRI review.   Review of Systems   Objective: Vital Signs: There were no vitals taken for this visit.  Physical Exam  Ortho Exam Right ankle exam is stable. Specialty Comments:  No specialty comments available.  Imaging: No results found.   PMFS History: Patient Active Problem List   Diagnosis Date Noted  . Psoriasis 10/29/2016  . Morbid obesity ([place]) 01/04/2016  . Hypothyroidism 04/05/2014  . Type 2 diabetes mellitus, uncontrolled ([place]) 03/06/2014  . NECK PAIN 11/28/2009  . BRACHIAL NEURITIS OR RADICULITIS NOS 11/28/2009  . VITAMIN B12 DEFICIENCY 09/14/2009  . HYPERTRIGLYCERIDEMIA 09/14/2009  . Diabetic neuropathy ([place]) 09/11/2009  . ECZEMA 06/07/2009  . Diabetes mellitus  without complication ([place]) 38/75/6433  . ALLERGIC URTICARIA 05/02/2009  . ANGIOEDEMA 05/02/2009  . SKIN LESION 11/27/2008  . Anxiety state 07/26/2008  . ABDOMINAL PAIN, GENERALIZED 04/03/2008  . [place] DISEASE, LUMBAR 06/22/2007  . ARTHRITIS 06/11/2007  . LOW BACK PAIN 12/28/2006  . Essential hypertension 09/11/2006  . Asthma 09/11/2006  . GERD 09/11/2006  . History of colonic polyps 09/11/2006   Past Medical History:  Diagnosis Date  . Allergy   . Angioedema   . Asthma   . Blood transfusion without reported diagnosis    with both vaginal deliveries bleed out and had blood  . DDD (degenerative disc disease), lumbar   . Diabetes mellitus    sees Dr. Cruzita Lederer   . Eczema   . GERD (gastroesophageal reflux disease)   . Hx of colonic polyps   . Hyperlipidemia   . Hypertension   . Interstitial cystitis    per Dr. Risa Grill  . Low back pain   . Meningitis   . Seizures ([place])    seizures with brain tumor-no sz since 1984  . Thyroid disease     Family History  Problem Relation Age of Onset  . Hypertension Unknown        family hx  . Stroke Mother   . Rheum arthritis Mother   . COPD Mother   . Stroke Father   . Colon cancer  Father   . Colon polyps Neg Hx     Past Surgical History:  Procedure Laterality Date  . ABDOMINAL HYSTERECTOMY    . BRAIN TUMOR EXCISION     benign 1984  . CHOLECYSTECTOMY    . COLONOSCOPY  05/02/2015   per Dr. Silverio Decamp, internal hemorrhoids but no polyps, repeat in 10 yrs   . cystic mass  4.4.11   remval from abdomen per [facility] Kendell [facility]ane at [facility]  . POLYPECTOMY    . SVD X 2    . TONSILLECTOMY    . TUBAL LIGATION     bilateral   Social History   Occupational History  . Not on file  Tobacco Use  . Smoking status: Former Research scientist (life sciences)  . Smokeless tobacco: Never Used  Substance and Sexual Activity  . Alcohol use: No    Alcohol/week: 0.0 standard drinks  . Drug use: No  . Sexual activity: Not on file

## 2018-01-07 DIAGNOSIS — Y999 Unspecified external cause status: Secondary | ICD-10-CM | POA: Diagnosis not present

## 2018-01-07 DIAGNOSIS — G8918 Other acute postprocedural pain: Secondary | ICD-10-CM | POA: Diagnosis not present

## 2018-01-07 DIAGNOSIS — X58XXXA Exposure to other specified factors, initial encounter: Secondary | ICD-10-CM | POA: Diagnosis not present

## 2018-01-07 DIAGNOSIS — S86012A Strain of left Achilles tendon, initial encounter: Secondary | ICD-10-CM | POA: Diagnosis not present

## 2018-01-07 DIAGNOSIS — S86011A Strain of right Achilles tendon, initial encounter: Secondary | ICD-10-CM | POA: Diagnosis not present

## 2018-01-07 DIAGNOSIS — M25774 Osteophyte, right foot: Secondary | ICD-10-CM | POA: Diagnosis not present

## 2018-01-11 ENCOUNTER — Other Ambulatory Visit: Payer: Self-pay | Admitting: Family Medicine

## 2018-01-11 ENCOUNTER — Encounter: Payer: Self-pay | Admitting: *Deleted

## 2018-01-11 ENCOUNTER — Telehealth (INDEPENDENT_AMBULATORY_CARE_PROVIDER_SITE_OTHER): Payer: Self-pay | Admitting: Orthopaedic Surgery

## 2018-01-11 MED ORDER — SITAGLIPTIN PHOSPHATE 100 MG PO TABS: 100.0000 mg | ORAL_TABLET | Freq: Every day | ORAL | 3 refills | Status: DC

## 2018-01-11 NOTE — Telephone Encounter (Signed)
Patient request Rx for knee scooter, states the wheelchair is nit working

## 2018-01-11 NOTE — Telephone Encounter (Signed)
Ok to do

## 2018-01-11 NOTE — Telephone Encounter (Signed)
yes

## 2018-01-12 NOTE — Telephone Encounter (Signed)
IC patient and LMVM advising knee scooter Rx ready for pickup

## 2018-01-21 ENCOUNTER — Ambulatory Visit (INDEPENDENT_AMBULATORY_CARE_PROVIDER_SITE_OTHER): Payer: PPO | Admitting: Orthopaedic Surgery

## 2018-01-21 ENCOUNTER — Encounter (INDEPENDENT_AMBULATORY_CARE_PROVIDER_SITE_OTHER): Payer: Self-pay | Admitting: Orthopaedic Surgery

## 2018-01-21 DIAGNOSIS — M9261 Juvenile osteochondrosis of tarsus, right ankle: Secondary | ICD-10-CM

## 2018-01-21 DIAGNOSIS — S86011A Strain of right Achilles tendon, initial encounter: Secondary | ICD-10-CM

## 2018-01-21 MED ORDER — MUPIROCIN 2 % EX OINT: 1.0000 "application " | TOPICAL_OINTMENT | Freq: Two times a day (BID) | CUTANEOUS | 0 refills | Status: DC

## 2018-01-21 NOTE — Progress Notes (Signed)
Post-Op Visit Note   Patient: Christina [name]           Date of Birth: [birth year]           MRN: [id number] Visit Date: 01/21/2018 PCP: Laurey Morale, MD   Assessment & Plan:  Chief Complaint:  Chief Complaint  Patient presents with  . Right Ankle - Routine Post Op   Visit Diagnoses:  1. Rupture of right Achilles tendon, initial encounter   2. Haglund's deformity of right heel     Plan: [name] follows up today 2-week status post right Achilles repair and Haglund's deformity correction.  She is overall doing well.  Her pain is mild.  Her surgical incision is healed without any dehiscence.  Swelling is mild.  Today we remove the sutures and placed Steri-Strips.  Bactroban ointment to the superficial blister.  She may begin physical therapy per protocol.  Protected weightbearing with a walker and Cam walker with heel lifts.  Recheck in 4 weeks.  Follow-Up Instructions: Return in about 4 weeks (around 02/18/2018).   Orders:  No orders of the defined types were placed in this encounter.  Meds ordered this encounter  Medications  . mupirocin ointment (BACTROBAN) 2 %    Sig: Apply 1 application topically 2 (two) times daily.    Dispense:  22 g    Refill:  0    Imaging: No results found.  PMFS History: Patient Active Problem List   Diagnosis Date Noted  . Psoriasis 10/29/2016  . Morbid obesity ([place]) 01/04/2016  . Hypothyroidism 04/05/2014  . Type 2 diabetes mellitus, uncontrolled ([place]) 03/06/2014  . NECK PAIN 11/28/2009  . BRACHIAL NEURITIS OR RADICULITIS NOS 11/28/2009  . VITAMIN B12 DEFICIENCY 09/14/2009  . HYPERTRIGLYCERIDEMIA 09/14/2009  . Diabetic neuropathy ([place]) 09/11/2009  . ECZEMA 06/07/2009  . Diabetes mellitus without complication ([place]) 63/14/9702  . ALLERGIC URTICARIA 05/02/2009  . ANGIOEDEMA 05/02/2009  . SKIN LESION 11/27/2008  . Anxiety state 07/26/2008  . ABDOMINAL PAIN, GENERALIZED 04/03/2008  . [place] DISEASE, LUMBAR 06/22/2007  . ARTHRITIS 06/11/2007    . LOW BACK PAIN 12/28/2006  . Essential hypertension 09/11/2006  . Asthma 09/11/2006  . GERD 09/11/2006  . History of colonic polyps 09/11/2006   Past Medical History:  Diagnosis Date  . Allergy   . Angioedema   . Asthma   . Blood transfusion without reported diagnosis    with both vaginal deliveries bleed out and had blood  . DDD (degenerative disc disease), lumbar   . Diabetes mellitus    sees Dr. Cruzita Lederer   . Eczema   . GERD (gastroesophageal reflux disease)   . Hx of colonic polyps   . Hyperlipidemia   . Hypertension   . Interstitial cystitis    per Dr. Risa Grill  . Low back pain   . Meningitis   . Seizures ([place])    seizures with brain tumor-no sz since 1984  . Thyroid disease     Family History  Problem Relation Age of Onset  . Hypertension Unknown        family hx  . Stroke Mother   . Rheum arthritis Mother   . COPD Mother   . Stroke Father   . Colon cancer Father   . Colon polyps Neg Hx     Past Surgical History:  Procedure Laterality Date  . ABDOMINAL HYSTERECTOMY    . BRAIN TUMOR EXCISION     benign 1984  . CHOLECYSTECTOMY    . COLONOSCOPY  05/02/2015  per Dr. Silverio Decamp, internal hemorrhoids but no polyps, repeat in 10 yrs   . cystic mass  4.4.11   remval from abdomen per [facility] Kendell Bane at [facility]  . POLYPECTOMY    . SVD X 2    . TONSILLECTOMY    . TUBAL LIGATION     bilateral   Social History   Occupational History  . Not on file  Tobacco Use  . Smoking status: Former Research scientist (life sciences)  . Smokeless tobacco: Never Used  Substance and Sexual Activity  . Alcohol use: No    Alcohol/week: 0.0 standard drinks  . Drug use: No  . Sexual activity: Not on file

## 2018-01-22 ENCOUNTER — Telehealth (INDEPENDENT_AMBULATORY_CARE_PROVIDER_SITE_OTHER): Payer: Self-pay | Admitting: Orthopaedic Surgery

## 2018-01-22 NOTE — Telephone Encounter (Signed)
Last 2 ov notes faxed to [facility] Patient [phone], [place]

## 2018-01-25 DIAGNOSIS — R2681 Unsteadiness on feet: Secondary | ICD-10-CM | POA: Diagnosis not present

## 2018-02-05 ENCOUNTER — Telehealth (INDEPENDENT_AMBULATORY_CARE_PROVIDER_SITE_OTHER): Payer: Self-pay | Admitting: Orthopaedic Surgery

## 2018-02-05 NOTE — Telephone Encounter (Signed)
[facility] FAXED

## 2018-02-05 NOTE — Telephone Encounter (Signed)
[facility] in [place] called needing patient demographics referral for OT or PT.  Order can be faxed to [phone]

## 2018-02-09 DIAGNOSIS — R262 Difficulty in walking, not elsewhere classified: Secondary | ICD-10-CM | POA: Diagnosis not present

## 2018-02-09 DIAGNOSIS — M25571 Pain in right ankle and joints of right foot: Secondary | ICD-10-CM | POA: Diagnosis not present

## 2018-02-09 DIAGNOSIS — M66871 Spontaneous rupture of other tendons, right ankle and foot: Secondary | ICD-10-CM | POA: Diagnosis not present

## 2018-02-09 DIAGNOSIS — Z4789 Encounter for other orthopedic aftercare: Secondary | ICD-10-CM | POA: Diagnosis not present

## 2018-02-18 ENCOUNTER — Ambulatory Visit (INDEPENDENT_AMBULATORY_CARE_PROVIDER_SITE_OTHER): Payer: PPO | Admitting: Physician Assistant

## 2018-02-18 ENCOUNTER — Encounter (INDEPENDENT_AMBULATORY_CARE_PROVIDER_SITE_OTHER): Payer: Self-pay | Admitting: Orthopaedic Surgery

## 2018-02-18 VITALS — Temp 98.4°F

## 2018-02-18 DIAGNOSIS — Z9889 Other specified postprocedural states: Secondary | ICD-10-CM

## 2018-02-18 MED ORDER — MUPIROCIN 2 % EX OINT: TOPICAL_OINTMENT | CUTANEOUS | 0 refills | Status: AC

## 2018-02-18 MED ORDER — MUPIROCIN 2 % EX OINT: 1.0000 "application " | TOPICAL_OINTMENT | Freq: Two times a day (BID) | CUTANEOUS | 0 refills | Status: DC

## 2018-02-18 NOTE — Progress Notes (Signed)
Post-Op Visit Note   Patient: Christina [name]           Date of Birth: [birth year]           MRN: [id number] Visit Date: 02/18/2018 PCP: Laurey Morale, MD   Assessment & Plan:  Chief Complaint:  Chief Complaint  Patient presents with  . Right Knee - Pain   Visit Diagnoses:  1. S/P Achilles tendon repair     Plan: Patient is a pleasant 53 year old female who presents our clinic today 5 weeks status post right Achilles tendon repair, date of surgery 01/13/2018.  She noticed a slight area of wound dehiscence to the distal third of the incision following suture removal.  Her PCP prescribed her Bactrim for this.  She has been covering this with a Band-Aid.  No fevers or chills.  She has been ambulating with the cam walker but at times without the cam walker.  She has been unable to attend formal physical therapy due to financial constraints.  She has been working on a home exercise program, however.  Examination of her right heel shows a healing surgical incision with peri-incisional erythema.  Mild tenderness throughout.  She does have a slight wound dehiscence to the distal third.  No active drainage.  This does appear to be filling in.  Of note, she is diabetic.  At this point, we will cover the wound with mupirocin.  A new prescription was provided the patient today for this.  She will do this twice daily after gently cleaning the wound with soap and water.  I have reinforced the importance of weightbearing in her cam walker as opposed to barefoot.  He will continue to work on her range of motion exercises as tolerated.  Follow-up with [place] in 3 weeks time for recheck.  Follow-Up Instructions: Return in about 3 weeks (around 03/11/2018).   Orders:  No orders of the defined types were placed in this encounter.  Meds ordered this encounter  Medications  . mupirocin ointment (BACTROBAN) 2 %    Sig: Apply to affected area 3 times daily    Dispense:  22 g    Refill:  0  . mupirocin ointment  (BACTROBAN) 2 %    Sig: Apply 1 application topically 2 (two) times daily.    Dispense:  22 g    Refill:  0    Imaging: No new imaging  PMFS History: Patient Active Problem List   Diagnosis Date Noted  . S/P Achilles tendon repair 02/18/2018  . Psoriasis 10/29/2016  . Morbid obesity ([place]) 01/04/2016  . Hypothyroidism 04/05/2014  . Type 2 diabetes mellitus, uncontrolled ([place]) 03/06/2014  . NECK PAIN 11/28/2009  . BRACHIAL NEURITIS OR RADICULITIS NOS 11/28/2009  . VITAMIN B12 DEFICIENCY 09/14/2009  . HYPERTRIGLYCERIDEMIA 09/14/2009  . Diabetic neuropathy ([place]) 09/11/2009  . ECZEMA 06/07/2009  . Diabetes mellitus without complication ([place]) 02/54/2706  . ALLERGIC URTICARIA 05/02/2009  . ANGIOEDEMA 05/02/2009  . SKIN LESION 11/27/2008  . Anxiety state 07/26/2008  . ABDOMINAL PAIN, GENERALIZED 04/03/2008  . [place] DISEASE, LUMBAR 06/22/2007  . ARTHRITIS 06/11/2007  . LOW BACK PAIN 12/28/2006  . Essential hypertension 09/11/2006  . Asthma 09/11/2006  . GERD 09/11/2006  . History of colonic polyps 09/11/2006   Past Medical History:  Diagnosis Date  . Allergy   . Angioedema   . Asthma   . Blood transfusion without reported diagnosis    with both vaginal deliveries bleed out and had blood  . DDD (  degenerative disc disease), lumbar   . Diabetes mellitus    sees Dr. Cruzita Lederer   . Eczema   . GERD (gastroesophageal reflux disease)   . Hx of colonic polyps   . Hyperlipidemia   . Hypertension   . Interstitial cystitis    per Dr. Risa Grill  . Low back pain   . Meningitis   . Seizures ([place])    seizures with brain tumor-no sz since 1984  . Thyroid disease     Family History  Problem Relation Age of Onset  . Hypertension Unknown        family hx  . Stroke Mother   . Rheum arthritis Mother   . COPD Mother   . Stroke Father   . Colon cancer Father   . Colon polyps Neg Hx     Past Surgical History:  Procedure Laterality Date  . ABDOMINAL HYSTERECTOMY    . BRAIN TUMOR  EXCISION     benign 1984  . CHOLECYSTECTOMY    . COLONOSCOPY  05/02/2015   per Dr. Silverio Decamp, internal hemorrhoids but no polyps, repeat in 10 yrs   . cystic mass  4.4.11   remval from abdomen per Dr. Kendell Bane at [facility]  . POLYPECTOMY    . SVD X 2    . TONSILLECTOMY    . TUBAL LIGATION     bilateral   Social History   Occupational History  . Not on file  Tobacco Use  . Smoking status: Former Research scientist (life sciences)  . Smokeless tobacco: Never Used  Substance and Sexual Activity  . Alcohol use: No    Alcohol/week: 0.0 standard drinks  . Drug use: No  . Sexual activity: Not on file

## 2018-03-07 ENCOUNTER — Other Ambulatory Visit: Payer: Self-pay | Admitting: Family Medicine

## 2018-03-10 NOTE — Telephone Encounter (Signed)
Last rx given on 6/17 #180 with 5 ref

## 2018-03-16 ENCOUNTER — Other Ambulatory Visit: Payer: Self-pay | Admitting: Family Medicine

## 2018-03-17 ENCOUNTER — Ambulatory Visit (INDEPENDENT_AMBULATORY_CARE_PROVIDER_SITE_OTHER): Payer: PPO | Admitting: Family Medicine

## 2018-03-17 ENCOUNTER — Encounter: Payer: Self-pay | Admitting: Family Medicine

## 2018-03-17 VITALS — BP 160/82 | HR 78 | Temp 97.8°F | Ht 62.75 in | Wt 217.8 lb

## 2018-03-17 DIAGNOSIS — L409 Psoriasis, unspecified: Secondary | ICD-10-CM | POA: Diagnosis not present

## 2018-03-17 DIAGNOSIS — E1165 Type 2 diabetes mellitus with hyperglycemia: Secondary | ICD-10-CM

## 2018-03-17 DIAGNOSIS — E038 Other specified hypothyroidism: Secondary | ICD-10-CM | POA: Diagnosis not present

## 2018-03-17 DIAGNOSIS — I10 Essential (primary) hypertension: Secondary | ICD-10-CM

## 2018-03-17 MED ORDER — CEPHALEXIN 500 MG PO CAPS: 500.0000 mg | ORAL_CAPSULE | Freq: Three times a day (TID) | ORAL | 0 refills | Status: DC

## 2018-03-17 NOTE — Progress Notes (Signed)
   Subjective:    Patient ID: Christina [name], female    DOB: 01-17-1966, 53 y.o.   MRN: [id number]  HPI Here for several issues. First she asks to check her thyroid levels because her hair has been thinning and falling out for the past few months. Also for 2 weeks she has has a painful rash on the scalp. No fever. She is recovering from surgery to repair a torn Achilles tendon. Her last A1c in November was 8.0 and we wanted to start her on Januvia. However she could not afford to get this filled.    Review of Systems  Constitutional: Negative.   Respiratory: Negative.   Cardiovascular: Negative.   Musculoskeletal: Positive for arthralgias.  Skin: Positive for rash.       Objective:   Physical Exam Constitutional:      Comments: In a wheelchair   HENT:     Head:     Comments: Her hair is indeed thinning. There is a collection of red excoriated papules on the vertex of the scalp and at her posterior hairline  Cardiovascular:     Rate and Rhythm: Normal rate and regular rhythm.     Pulses: Normal pulses.     Heart sounds: Normal heart sounds.  Pulmonary:     Effort: Pulmonary effort is normal.     Breath sounds: Normal breath sounds.  Lymphadenopathy:     Cervical: No cervical adenopathy.  Neurological:     Mental Status: She is alert.           Assessment & Plan:  We need to see if her thyroid dose is appropriate, given the hair loss. Get labs today to include a full thyroid panel. Check an A1c. She has a folliculitis on the scalp which we will treat with Keflex.  Alysia Penna, MD

## 2018-03-18 LAB — CBC WITH DIFFERENTIAL/PLATELET
Basophils Absolute: 0.1 10*3/uL (ref 0.0–0.1)
Basophils Relative: 1.3 % (ref 0.0–3.0)
EOS ABS: 0.1 10*3/uL (ref 0.0–0.7)
Eosinophils Relative: 1.4 % (ref 0.0–5.0)
HEMATOCRIT: 34.9 % — AB (ref 36.0–46.0)
HEMOGLOBIN: 11.5 g/dL — AB (ref 12.0–15.0)
LYMPHS PCT: 26.1 % (ref 12.0–46.0)
Lymphs Abs: 1.4 10*3/uL (ref 0.7–4.0)
MCHC: 32.9 g/dL (ref 30.0–36.0)
MCV: 80.6 fl (ref 78.0–100.0)
Monocytes Absolute: 0.3 10*3/uL (ref 0.1–1.0)
Monocytes Relative: 5.2 % (ref 3.0–12.0)
Neutro Abs: 3.5 10*3/uL (ref 1.4–7.7)
Neutrophils Relative %: 66 % (ref 43.0–77.0)
Platelets: 173 10*3/uL (ref 150.0–400.0)
RBC: 4.34 Mil/uL (ref 3.87–5.11)
RDW: 14.5 % (ref 11.5–15.5)
WBC: 5.3 10*3/uL (ref 4.0–10.5)

## 2018-03-18 LAB — T3, FREE: T3, Free: 3.5 pg/mL (ref 2.3–4.2)

## 2018-03-18 LAB — HEPATIC FUNCTION PANEL
ALT: 26 U/L (ref 0–35)
AST: 21 U/L (ref 0–37)
Albumin: 4.4 g/dL (ref 3.5–5.2)
Alkaline Phosphatase: 50 U/L (ref 39–117)
Bilirubin, Direct: 0.1 mg/dL (ref 0.0–0.3)
Total Bilirubin: 0.3 mg/dL (ref 0.2–1.2)
Total Protein: 7 g/dL (ref 6.0–8.3)

## 2018-03-18 LAB — BASIC METABOLIC PANEL
BUN: 24 mg/dL — ABNORMAL HIGH (ref 6–23)
CO2: 32 mEq/L (ref 19–32)
Calcium: 9.5 mg/dL (ref 8.4–10.5)
Chloride: 102 mEq/L (ref 96–112)
Creatinine, Ser: 1.17 mg/dL (ref 0.40–1.20)
GFR: 48.28 mL/min — ABNORMAL LOW (ref >60.00)
Glucose, Bld: 113 mg/dL — ABNORMAL HIGH (ref 70–99)
POTASSIUM: 4.4 meq/L (ref 3.5–5.1)
Sodium: 142 mEq/L (ref 135–145)

## 2018-03-18 LAB — HEMOGLOBIN A1C: Hgb A1c MFr Bld: 7.5 % — ABNORMAL HIGH (ref 4.6–6.5)

## 2018-03-18 LAB — T4, FREE: Free T4: 0.86 ng/dL (ref 0.60–1.60)

## 2018-03-18 LAB — TSH: TSH: 3.33 u[IU]/mL (ref 0.35–4.50)

## 2018-03-19 ENCOUNTER — Encounter: Payer: Self-pay | Admitting: *Deleted

## 2018-03-26 ENCOUNTER — Other Ambulatory Visit: Payer: Self-pay | Admitting: Family Medicine

## 2018-03-30 ENCOUNTER — Other Ambulatory Visit: Payer: Self-pay | Admitting: Family Medicine

## 2018-04-01 ENCOUNTER — Encounter (INDEPENDENT_AMBULATORY_CARE_PROVIDER_SITE_OTHER): Payer: Self-pay

## 2018-04-01 ENCOUNTER — Ambulatory Visit (INDEPENDENT_AMBULATORY_CARE_PROVIDER_SITE_OTHER): Payer: PPO | Admitting: Orthopaedic Surgery

## 2018-04-01 ENCOUNTER — Other Ambulatory Visit: Payer: Self-pay | Admitting: Family Medicine

## 2018-04-02 ENCOUNTER — Other Ambulatory Visit: Payer: Self-pay | Admitting: Family Medicine

## 2018-04-02 MED ORDER — TRAMADOL HCL 50 MG PO TABS: ORAL_TABLET | ORAL | 5 refills | Status: DC

## 2018-04-02 NOTE — Telephone Encounter (Signed)
[facility] called and stated that the pt called in and stated that the refill did not get to the pharmacy.  I have called this medication into the pharmacy and spoke with the pharmacist.

## 2018-04-06 ENCOUNTER — Other Ambulatory Visit: Payer: Self-pay | Admitting: Family Medicine

## 2018-04-07 NOTE — Telephone Encounter (Signed)
Dr. Fry please advise on refill. Thanks  

## 2018-04-10 ENCOUNTER — Other Ambulatory Visit: Payer: Self-pay | Admitting: Family Medicine

## 2018-05-10 ENCOUNTER — Other Ambulatory Visit: Payer: Self-pay | Admitting: Family Medicine

## 2018-05-17 ENCOUNTER — Encounter: Payer: Self-pay | Admitting: Family Medicine

## 2018-05-18 MED ORDER — ATORVASTATIN CALCIUM 10 MG PO TABS: 10.0000 mg | ORAL_TABLET | Freq: Every day | ORAL | 3 refills | Status: DC

## 2018-05-18 NOTE — Telephone Encounter (Signed)
This medication has not been filled since 01/19/17. Last OV was 03/17/2018.  Does patient need a visit for this?

## 2018-05-18 NOTE — Telephone Encounter (Signed)
Please refill this for one year  

## 2018-06-08 ENCOUNTER — Other Ambulatory Visit: Payer: Self-pay | Admitting: Family Medicine

## 2018-06-23 ENCOUNTER — Other Ambulatory Visit: Payer: Self-pay | Admitting: Family Medicine

## 2018-07-03 ENCOUNTER — Encounter: Payer: Self-pay | Admitting: Family Medicine

## 2018-07-03 ENCOUNTER — Other Ambulatory Visit: Payer: Self-pay | Admitting: Family Medicine

## 2018-07-05 MED ORDER — POTASSIUM CHLORIDE CRYS ER 10 MEQ PO TBCR: EXTENDED_RELEASE_TABLET | ORAL | 3 refills | Status: DC

## 2018-07-05 MED ORDER — OMEPRAZOLE 20 MG PO CPDR: 20.0000 mg | DELAYED_RELEASE_CAPSULE | Freq: Two times a day (BID) | ORAL | 3 refills | Status: DC

## 2018-07-05 NOTE — Telephone Encounter (Signed)
Call in [place] #90 with 3 rf, also Omeprazole #180 with 3 rf

## 2018-07-05 NOTE — Telephone Encounter (Signed)
Dr. Sarajane Jews please advise on the refill of bactrim as well.  Thanks

## 2018-07-20 ENCOUNTER — Encounter: Payer: Self-pay | Admitting: Family Medicine

## 2018-07-20 NOTE — Telephone Encounter (Signed)
Dr. Fry please advise. Thanks  

## 2018-07-21 MED ORDER — SULFAMETHOXAZOLE-TRIMETHOPRIM 800-160 MG PO TABS: 1.0000 | ORAL_TABLET | Freq: Two times a day (BID) | ORAL | 11 refills | Status: DC

## 2018-07-21 NOTE — Telephone Encounter (Signed)
No need for an OV. Call in #60 with 11 rf

## 2018-08-09 ENCOUNTER — Other Ambulatory Visit: Payer: Self-pay | Admitting: Family Medicine

## 2018-09-17 ENCOUNTER — Other Ambulatory Visit: Payer: Self-pay | Admitting: Family Medicine

## 2018-09-17 NOTE — Telephone Encounter (Signed)
Last OV 03/17/2018 Please advise Glipizide was last filled 07/28/2016, should the patient continue this medication.

## 2018-10-10 ENCOUNTER — Other Ambulatory Visit: Payer: Self-pay | Admitting: Family Medicine

## 2018-11-13 ENCOUNTER — Other Ambulatory Visit: Payer: Self-pay | Admitting: Family Medicine

## 2018-11-18 ENCOUNTER — Other Ambulatory Visit: Payer: Self-pay | Admitting: Family Medicine

## 2018-11-30 ENCOUNTER — Ambulatory Visit (INDEPENDENT_AMBULATORY_CARE_PROVIDER_SITE_OTHER): Payer: PPO | Admitting: Family Medicine

## 2018-11-30 ENCOUNTER — Other Ambulatory Visit: Payer: Self-pay

## 2018-11-30 ENCOUNTER — Encounter: Payer: Self-pay | Admitting: Family Medicine

## 2018-11-30 VITALS — BP 160/100 | HR 75 | Temp 98.5°F | Ht 62.5 in

## 2018-11-30 DIAGNOSIS — G8929 Other chronic pain: Secondary | ICD-10-CM | POA: Diagnosis not present

## 2018-11-30 DIAGNOSIS — E1165 Type 2 diabetes mellitus with hyperglycemia: Secondary | ICD-10-CM

## 2018-11-30 DIAGNOSIS — M5440 Lumbago with sciatica, unspecified side: Secondary | ICD-10-CM

## 2018-11-30 DIAGNOSIS — G4700 Insomnia, unspecified: Secondary | ICD-10-CM | POA: Diagnosis not present

## 2018-11-30 DIAGNOSIS — Z23 Encounter for immunization: Secondary | ICD-10-CM | POA: Diagnosis not present

## 2018-11-30 DIAGNOSIS — I10 Essential (primary) hypertension: Secondary | ICD-10-CM

## 2018-11-30 DIAGNOSIS — E119 Type 2 diabetes mellitus without complications: Secondary | ICD-10-CM | POA: Diagnosis not present

## 2018-11-30 LAB — POCT GLYCOSYLATED HEMOGLOBIN (HGB A1C): Hemoglobin A1C: 7.9 % — AB (ref 4.0–5.6)

## 2018-11-30 MED ORDER — TRAMADOL HCL 50 MG PO TABS: 100.0000 mg | ORAL_TABLET | Freq: Four times a day (QID) | ORAL | 5 refills | Status: DC | PRN

## 2018-11-30 MED ORDER — TEMAZEPAM 30 MG PO CAPS: 30.0000 mg | ORAL_CAPSULE | Freq: Every evening | ORAL | 5 refills | Status: DC | PRN

## 2018-11-30 MED ORDER — CYCLOBENZAPRINE HCL 10 MG PO TABS: ORAL_TABLET | ORAL | 5 refills | Status: DC

## 2018-11-30 NOTE — Patient Instructions (Signed)
Health Maintenance Due  Topic Date Due  . PNEUMOCOCCAL POLYSACCHARIDE VACCINE AGE 54-64 HIGH RISK  08/28/1966  . FOOT EXAM  08/28/1974  . OPHTHALMOLOGY EXAM  08/28/1974  . PAP SMEAR-Modifier  08/27/1985  . MAMMOGRAM  08/28/2014  . URINE MICROALBUMIN  02/22/2015  . INFLUENZA VACCINE  08/28/2018  . HEMOGLOBIN A1C  09/15/2018    Depression screen PHQ 2/9 05/25/2014  Decreased Interest 0  Down, Depressed, Hopeless 0  PHQ - 2 Score 0

## 2018-11-30 NOTE — Progress Notes (Signed)
   Subjective:    Patient ID: Christina [name], female    DOB: December 22, 1965, 53 y.o.   MRN: [id number]  HPI Here for follow up. She is doing well in general but she complains of trouble sleeping. She used Temazepam a few years ago. Her BP has been stable at home. Her A1c today is up at 7.9 but she admits to eating a lot of starchy foods.    Review of Systems  Constitutional: Negative.   Respiratory: Negative.   Cardiovascular: Negative.   Musculoskeletal: Positive for back pain.  Psychiatric/Behavioral: Positive for sleep disturbance.       Objective:   Physical Exam Constitutional:      Appearance: Normal appearance.     Comments: In her wheelchair   Cardiovascular:     Rate and Rhythm: Normal rate and regular rhythm.     Pulses: Normal pulses.     Heart sounds: Normal heart sounds.  Pulmonary:     Effort: Pulmonary effort is normal.     Breath sounds: Normal breath sounds.  Neurological:     General: No focal deficit present.     Mental Status: She is alert and oriented to person, place, and time.           Assessment & Plan:  Her HTN and asthma are stable. For the back pain we will increase her Tramadol dosing to every 6 hours prn. As for the diabetes, she will cut back on her carb intake.  Alysia Penna, MD

## 2019-02-09 ENCOUNTER — Other Ambulatory Visit: Payer: Self-pay | Admitting: Family Medicine

## 2019-02-16 ENCOUNTER — Other Ambulatory Visit: Payer: Self-pay | Admitting: Family Medicine

## 2019-02-16 NOTE — Telephone Encounter (Signed)
Routing to PCP for refill advise

## 2019-02-19 ENCOUNTER — Encounter: Payer: Self-pay | Admitting: Family Medicine

## 2019-03-09 ENCOUNTER — Telehealth: Payer: PPO

## 2019-03-10 ENCOUNTER — Encounter: Payer: Self-pay | Admitting: Family Medicine

## 2019-03-10 ENCOUNTER — Other Ambulatory Visit: Payer: Self-pay | Admitting: Family Medicine

## 2019-03-10 MED ORDER — LEVOTHYROXINE SODIUM 50 MCG PO TABS: ORAL_TABLET | ORAL | 3 refills | Status: DC

## 2019-03-10 NOTE — Telephone Encounter (Signed)
Last filled 07/20/2017 Last OV 11/30/2018  Please advise. Should the patient continue this medication?

## 2019-03-11 MED ORDER — MUPIROCIN 2 % EX OINT: TOPICAL_OINTMENT | Freq: Three times a day (TID) | CUTANEOUS | 2 refills | Status: DC

## 2019-03-11 NOTE — Telephone Encounter (Signed)
Call in Bactroban ointment (not the nasal type) to apply BID as needed, 15 gm with 2 rf. If this does not heal up in 2 weeks, she she should make an in person OV for me to check it

## 2019-03-18 ENCOUNTER — Telehealth: Payer: Self-pay | Admitting: Family Medicine

## 2019-03-18 NOTE — Chronic Care Management (AMB) (Signed)
  Chronic Care Management   Note  03/18/2019 Name: Christina [name] MRN: [id number] DOB: 10-Aug-1965  Christina [name] is a 54 y.o. year old female who is a primary care patient of Laurey Morale, MD. I reached out to [name] by phone today in response to a referral sent by Christina [name] PCP, Laurey Morale, MD.   Christina [name]:  1. CCM service includes personalized support from designated clinical staff supervised by her physician, [name] individualized plan of care and coordination with other care providers 2. 24/7 contact phone numbers for assistance for urgent and routine care needs. 3. Service will only be billed when office clinical staff spend 20 minutes or more in a month to coordinate care. 4. Only one practitioner may furnish and bill the service in a calendar month. 5. The patient may stop CCM services at any time (effective at the end of the month) by phone call to the office staff. 6. The patient will be responsible for cost sharing (co-pay) of up to 20% of the service fee (after annual deductible is met).  Patient agreed to services and verbal consent obtained.   Follow up plan:   Christina [name] [employer] Scheduler

## 2019-03-18 NOTE — Progress Notes (Signed)
  Chronic Care Management   Outreach Note  03/18/2019 Name: JAKEIA STRUNK MRN: [id number] DOB: 12/12/1965  Referred by: Laurey Morale, MD Reason for referral : No chief complaint on file.   An unsuccessful telephone outreach was attempted today. The patient was referred to the pharmacist for assistance with care management and care coordination.   Follow Up Plan:   Raynicia Dukes [employer] Scheduler

## 2019-03-28 ENCOUNTER — Ambulatory Visit: Payer: PPO

## 2019-03-28 ENCOUNTER — Other Ambulatory Visit: Payer: Self-pay

## 2019-03-28 ENCOUNTER — Telehealth: Payer: Self-pay

## 2019-03-28 DIAGNOSIS — I10 Essential (primary) hypertension: Secondary | ICD-10-CM

## 2019-03-28 DIAGNOSIS — E1165 Type 2 diabetes mellitus with hyperglycemia: Secondary | ICD-10-CM

## 2019-03-28 DIAGNOSIS — E119 Type 2 diabetes mellitus without complications: Secondary | ICD-10-CM

## 2019-03-28 NOTE — Chronic Care Management (AMB) (Signed)
Chronic Care Management Pharmacy  Name: Christina [name]  MRN: [id number] DOB: Nov 20, 1965  Initial Questions: 1. Have you seen any other providers since your last visit? NA 2. Any changes in your medicines or health? No   Chief Complaint/ HPI  Christina [name],  54 y.o. , female presents for their Initial CCM visit with the clinical pharmacist via telephone due to COVID-19 Pandemic.  PCP : Laurey Morale, MD  Their chronic conditions include: HTN, Asthma, GERD, DM, Hypothyroidism, Arthritis, skin lesions, vitamin B12 deficiency, anxiety, insomnia    Office Visits: 11/30/2018- Patient presented for office visit with Dr. Alysia Penna, MD for follow up. Patient's HTN and asthma are stable. Dose of tramadol increased to every 6 hours as needed for back pain. Patient instructed to decrease carb intake for her diabetes.   03/17/2018- Patient presented for office visit with Dr. Alysia Penna, MD for DM follow up. Patient was prescribed Januvia, previous A1c in Nov: 8.0%; however, patient was unable to afford it. Full thyroid panel ordered since patient complained for hair loss. She was given Keflex for folliculitis on scalp.   Consult Visit: 02/18/2018- Orthopedics- Patient presented to Dwana Melena, PA for s/p achilles tendon rupture. Patient to cover wound with mupirocin. Patient to use cam walker as opposed to barefoot. Follow up in 3 weeks for recheck.   Medications: Outpatient Encounter Medications as of 03/28/2019  Medication Sig  . aspirin 81 MG EC tablet Take 81 mg by mouth daily.    [name] atorvastatin (LIPITOR) 10 MG tablet Take 1 tablet (10 mg total) by mouth daily.  . cholecalciferol (VITAMIN D) 1000 UNITS tablet Take 1,000 Units by mouth daily.  . Cinnamon 500 MG TABS Take 2 tablets by mouth daily.  . [facility] ULTRA THIN LANCETS MISC 1 strip by Does not apply route as directed.  . cyclobenzaprine (FLEXERIL) 10 MG tablet TAKE 1 TABLET (10 MG TOTAL) BY MOUTH 3 (THREE) TIMES DAILY AS NEEDED FOR  MUSCLE SPASMS.  [name] diclofenac (VOLTAREN) 75 MG EC tablet TAKE 1 TABLET BY MOUTH TWICE A DAY  . diltiazem (CARDIZEM CD) 240 MG 24 hr capsule TAKE 1 CAPSULE BY MOUTH EVERY DAY  . diphenhydrAMINE (BENADRYL) 25 mg capsule Take 25 mg by mouth as needed.    . fenofibrate 160 MG tablet TAKE 1 TABLET BY MOUTH DAILY  . gabapentin (NEURONTIN) 300 MG capsule TAKE 2 CAPSULES (600 MG TOTAL) BY MOUTH 3 (THREE) TIMES DAILY.  [name] glipiZIDE (GLUCOTROL) 10 MG tablet TAKE 1 TABLET BY MOUTH TWICE A DAY BEFORE A MEAL  . glucose blood (TRUETEST TEST) test strip Use as instructed  . hydrochlorothiazide (HYDRODIURIL) 25 MG tablet TAKE 1 TABLET BY MOUTH EVERY DAY  . HYDROmorphone (DILAUDID) 4 MG tablet Take 1 tablet (4 mg total) by mouth every 6 (six) hours as needed for severe pain.  [name] levothyroxine (SYNTHROID) 50 MCG tablet TAKE 1 TABLET (50 MCG TOTAL) BY MOUTH DAILY.  . metFORMIN (GLUCOPHAGE-XR) 500 MG 24 hr tablet TAKE 1 TABLET BY MOUTH TWICE A DAY WITH MEAL  . metoprolol succinate (TOPROL-XL) 100 MG 24 hr tablet TAKE 1 TABLET BY MOUTH DAILY. TAKE WITH OR IMMEDIATELY FOLLOWING A MEAL  . mupirocin ointment (BACTROBAN) 2 % Apply topically 3 (three) times daily.  [name] omeprazole (PRILOSEC) 20 MG capsule Take 1 capsule (20 mg total) by mouth 2 (two) times daily.  . potassium chloride (KLOR-CON M10) 10 MEQ tablet TAKE 1 TABLET (10 MEQ TOTAL) BY MOUTH DAILY.  [name] promethazine (PHENERGAN)  25 MG tablet Take 1 tablet (25 mg total) by mouth every 6 (six) hours as needed for nausea.  . sertraline (ZOLOFT) 100 MG tablet TAKE 1.5 TABLETS BY MOUTH DAILY  . sulfamethoxazole-trimethoprim (BACTRIM DS) 800-160 MG tablet Take 1 tablet by mouth 2 (two) times daily.  . temazepam (RESTORIL) 30 MG capsule Take 1 capsule (30 mg total) by mouth at bedtime as needed for sleep.  . traMADol (ULTRAM) 50 MG tablet Take 2 tablets (100 mg total) by mouth every 6 (six) hours as needed for moderate pain. TAKE 2 TABLETS BY MOUTH 3 TIMES DAILY AS NEEDED  .  triamcinolone cream (KENALOG) 0.1 % Apply 1 application topically 2 (two) times daily.  . vitamin B-12 (CYANOCOBALAMIN) 1000 MCG tablet Take 1,000 mcg by mouth daily. 2500 mcg once daily  . vitamin E (VITAMIN E) 180 MG (400 UNITS) capsule Take 400 Units by mouth daily.  . cephALEXin (KEFLEX) 500 MG capsule TAKE 1 CAPSULE BY MOUTH THREE TIMES A DAY (Patient not taking: Reported on 03/28/2019)  . clobetasol (TEMOVATE) 0.05 % external solution Apply 1 application topically 2 (two) times daily. (Patient not taking: Reported on 03/28/2019)  . Clobetasol Propionate 0.05 % shampoo Apply 1 application topically daily.  . Cyanocobalamin (VITAMIN B 12 PO) Take 1,000 Units by mouth daily.  [name] desonide (DESOWEN) 0.05 % cream Apply topically 2 (two) times daily.    [name] ketoconazole (NIZORAL) 2 % shampoo Apply topically 2 (two) times a week.  . levothyroxine (SYNTHROID) 50 MCG tablet TAKE 1 TABLET (50 MCG TOTAL) BY MOUTH DAILY.  . sitaGLIPtin (JANUVIA) 100 MG tablet Take 1 tablet (100 mg total) by mouth daily. (Patient not taking: Reported on 03/28/2019)  . [DISCONTINUED] potassium chloride (KLOR-CON 10) 10 MEQ CR tablet Take 1 tablet (10 mEq total) by mouth daily.   No facility-administered encounter medications on file as of 03/28/2019.    Current Diagnosis/Assessment:  Goals Addressed            This Visit's Progress   . Pharmacy Care Plan       Current Barriers:  . Chronic Disease Management support, education, and care coordination needs related to HTN and DMII, Asthma, GERD, Hypothyroidism, Arthritis, Skin lesions, Anxiety, Insomnia   Pharmacist Clinical Goal(s):  [name] Over the next month, begin/ continue self health monitoring activities as directed today  . Call care management team with questions or concerns.  . With next months, schedule physical exam appointment with Dr. Sarajane Jews and obtain appropriate blood work.  . Diabetes:  o Achieve A1c <7.0% . Within this year, obtain diabetes preventative health  exams (eye and foot exams). . Improve lifestyle modifications (plate method; decrease carbohydrate intake).  . Blood pressure:  o Within next couple of months, achieve blood pressure within goal of your provider (130/80 or 140/90).  . Heart burn: Minimize reflux symptoms  . Insomnia: Continue to see improvement in insomnia. . Hypothyroidism: Maintain TSH between 0.45 to 4.5uIU/ml. . Chronic pain: Continue to see improvement in pain level.   Interventions: . Comprehensive medication review performed . Collaboration with provider re: medication management.  . Discussed with Dr. Sarajane Jews pending lab work. You will need to make physical exam appointment to obtain appropriate labs.  . Diabetes o TrueTest test strips were refilled.  o Continue metformin ER 500mg , 1 tablet twice daily and glipizide 10mg , 1 tablet twice daily.  . Blood pressure:  o Discussed need to continue checking blood pressure at home.  o Discussed diet modifications. DASH diet:  following a diet emphasizing fruits and vegetables and low-fat dairy products along with whole grains, fish, poultry, and nuts. Reducing red meats and sugars.  o Continue diltiazem (Cardizem CD) 240mg , 1 capsule daily, hydrochlorothiazide 25mg , 1 tablet daily, and metoprolol succinate 100mg , 1 tablet daily.  . High cholesterol:  o Continue atorvastatin 10mg , 1 tablet daily and fenofibrate 160mg , 1 tablet daily.  o Discussed diet high in plant sterols (fruits/vegetables/nuts, whole grains/ legumes) may reduce your cholesterol.  . Chronic pain/muscle spasms:  o Discussed pain regimen and avoiding concomitant use of tramadol and hydromorphone.  o Continue diclofenac 75mg , 1 tablet twice daily, tramadol 50mg , 2 tablets three times daily as needed for moderate pain, hydromorphone 4mg , 1 tablet every six hours as needed for severe pain, cyclobenzaprine 10mg , 1 tablet three times as needed for muscle spasms.  . Diabetic neuropathy o Continue gabapentin 300mg , 2  capsules three times daily.  . Anxiety o Continue sertraline 100mg , 1.5 tablets once daily.  . Insomnia:  o Discussed non-pharmacological interventions for insomnia. (Avoid napping, limit exposure to technology near bedtime, etc). o Continue temazepam 30mg , 1 capsule at bedtime as needed.  . Skin lesions o Continue mupirocin 2% ointment, Bactrim DS, 1 tablet twice daily, and triamcinolone 0.1%.  [name] Hypothyroidism:  o Continue levothyroxine 32mcg, 1 tablet daily.  [name] GERD:  o Discussed non-pharmacological interventions for acid reflux. Take measures to prevent acid reflux, such as avoiding spicy foods, avoiding caffeine, avoid laying down a few hours after eating, and raising the head of the bed. o Continue omeprazole 20mg , 1 capsule twice daily.   Patient Self Care Activities:  . Self administers medications as prescribed and Calls provider office for new concerns or questions . Continue current medications as directed by providers.  . Continue following up with specialists. . Continue at home blood pressure readings. . Continue checking blood sugars.   Initial goal documentation        Asthma   Eosinophil count:   Lab Results  Component Value Date/Time   EOSPCT 1.4 03/17/2018 04:39 PM  %                               Eos (Absolute):  Lab Results  Component Value Date/Time   EOSABS 0.1 03/17/2018 04:39 PM   Tobacco Status:  Social History   Tobacco Use  Smoking Status Former Smoker  Smokeless Tobacco Never Used  - Patient endorse stopped smoking 12-14 years ago.   - Patient notes she was diagnosed with asthma over 20 years ago. Working conditions made breathing worse. She denies having any breathing problems at the moment.   Patient has failed these meds in past: patient does not remember names of inhalers ("made her heart do weird things and sent her into panic").  Patient is currently controlled on the following medications:  - currently not on any inhalers.    Using maintenance inhaler regularly? No Frequency of rescue inhaler use:  never  We discussed:  discussed having rescue inhalers on hand (selective beta-2 agonist; i.e. Xopenex)  Plan Patient wants to continue as is.  Gave patient instructions to call and Xopenex can be trialed.    Diabetes   Recent Relevant Labs: Lab Results  Component Value Date/Time   HGBA1C 7.9 (A) 11/30/2018 02:11 PM   HGBA1C 7.5 (H) 03/17/2018 04:39 PM   HGBA1C 8.0 (H) 12/23/2017 11:57 AM   MICROALBUR 2.5 (H) 02/21/2014 02:22 PM  MICROALBUR 1.7 10/05/2013 10:42 AM    Checking BG: currently unable to check BGs (needs refill for test strips).   Patient has failed these meds in past: none   Patient is currently uncontrolled on the following medications:  - glipizide 10mg , 1 tablet twice daily - metformin XR 500mg  1 tablet twice daily   Last diabetic Eye exam: No results found for: HMDIABEYEEXA -  Patient notes last eye exam was 4 years ago. Counseled importance of diabetic eye exam every 1 to 2 years.  Last diabetic Foot exam: No results found for: HMDIABFOOTEX  - Discussed foot exams at least once yearly. Patient endorses daily feet inspections at home.   We discussed: diet and exercise extensively and diabetes preventative exams.   - Diet: patient endorses eating a lot of potatoes, oatmeal, Cheerios. Discussed plate method and limiting carbs/ starches per meal.  - Exercise: patient notes she is unable to exercise since she suffers from chronic pain.    Plan  Patient requested [facility]ill [facility]  Patient notes cost barrier for Januvia (was prescribed in the past, but was unable to pay for it).  Recommended to increase metformin ER to 500mg , 2 tablets in the AM and 1 tablet in the PM. Will need to monitor kidney function.  Patient due for microalbumin and A1c.   Hyperlipidemia   Lipid Panel     Component Value Date/Time   CHOL 164 04/01/2017 1544   TRIG 199.0 (H)  04/01/2017 1544   TRIG 160 (H) 12/30/2005 1425   HDL 48.70 04/01/2017 1544   CHOLHDL 3 04/01/2017 1544   VLDL 39.8 04/01/2017 1544   LDLCALC 76 04/01/2017 1544   LDLDIRECT 143.0 02/21/2014 1422    The 10-year ASCVD risk score Mikey Bussing DC Jr., et al., 2013) is: 6.5%  Patient has failed these meds in past: none   Patient is currently controlled on the following medications:  - atorvastatin 10mg , 1 tablet daily - fenofibrate 160mg , 1 tablet daily   We discussed:  diet and exercise extensively - How to reduce cholesterol through diet/weight management and physical activity.    - We discussed how a diet high in plant sterols (fruits/vegetables/nuts/whole grains/legumes) may reduce your cholesterol.   - Encouraged increasing fiber to a daily intake of 10-25g/day   Plan Continue current medications.  Patient due for lipid panel.   Hypertension  BP today is: >140/83mmHg   Office blood pressures are  BP Readings from Last 3 Encounters:  11/30/18 (!) 160/100  03/17/18 (!) 160/82  12/23/17 (!) 142/84  Patient has failed these meds in the past:   Patient checks BP at home 1-2x per week  Patient home BP readings are ranging: 166/91 mmHg (patient states pain elevates BP).  Pulse: 83 bpm   Patient is uncontrolled on:  - diltiazem (Cardizem CD) 240mg , 1 capsule daily - HCTZ 25mg  ,1 tablet daily - metoprolol succinate 100mg , 1 tablet daily   We discussed diet and exercise extensively  - DASH diet:  following a diet emphasizing fruits and vegetables and low-fat dairy products along with whole grains, fish, poultry, and nuts. Reducing red meats and sugars.  - Exercising- see exercising section above.  - Reducing the amount of salt intake to 1500mg /per day.  - Recommend using a salt substitute to replace your salt if you need flavor.    - Getting enough potassium in your diet equaling 3500-5000mg /day.  This helps to regulate BP by balancing out the effects of salt.  Weight reduction- We  discussed losing 5-10% of body weight.    Plan Will call patient back in 2 weeks to obtain BP readings and will reassess BP regimen.  Continue current medications and control with diet and exercise   Hypothyroidism   TSH  Date Value Ref Range Status  03/17/2018 3.33 0.35 - 4.50 uIU/mL Final    No components found for: T4  Patient has failed these meds in past: none  Patient is currently controlled on the following medications:  - levothyroxine 74mcg, 1 tablet daily   Plan Continue current medications  GERD  Patient has failed these meds in past: none  Patient is currently controlled on the following medications:  - omeprazole 20mg , 1 capsule twice daily    We discussed:  Discussed non-pharmacological interventions for acid reflux. Take measures to prevent acid reflux, such as avoiding spicy foods, avoiding caffeine, avoid laying down a few hours after eating, and raising the head of the bed.  Plan Continue current medications.    Vitamin B12 deficiency   Patient has failed these meds in past: none  Patient is currently managed on the following medications:  - cyanocobalamin 1000 units daily   Plan Continue current medications. Recommend vitamin B12 level.   Arthritis/ muscle spasms    Patient has failed these meds in past: Opana (oxymorphone)  Patient is currently controlled on the following medications: - diclofenac 75mg , 1 tablet twice daily  - tramadol 50mg , 2 tablets three times daily as needed for moderate pain  - hydromorphone 4mg , 1 tablet every six hours as needed for severe pain   - cyclobenzaprine 10mg , 1 tablet three times daily as needed for muscle spasms   We discussed:  concomitant use of 2 opioids and cyclobenzaprine increases risk of CNS depression  - Patient states she relies more on tramadol. If she notices pain is severe she still stop taking tramadol and a dose of hydromorphone (which she states is rare) and she limits use of cyclobenzaprine to 1  tablet at night.   Plan Patient endorses medications for a long time and it's the only thing that works for her back pain.  Continue current medications.   Diabetic neuropathy   Patient has failed these meds in past: none  Patient is currently controlled on the following medications:  - gabapentin 300mg , 2 capsules three times daily   Plan Continue current medications.   Skin lesions   Patient has failed these meds in past: none  Patient is currently controlled on the following medications:  - mupirocin 2% ointment, apply three times daily  - Bactrim DS, 1 tablet twice daily - triamcinolone 0.1% cream, apply twice daily  Plan Continue current medications.   Anxiety   Patient has failed these meds in past: none  Patient is currently controlled on the following medications:  - sertraline 100mg , 1.5 tablets once daily   Plan Continue current medications  Insomnia   Patient has failed these meds in past: none  Patient is currently controlled on the following medications:  - temazepam 30mg , 1 capsule at bedtime as needed for sleep  We discussed:  Discussed non-pharmacological interventions for insomnia. (Avoid napping, limit exposure to technology near bedtime, etc).  - patient notes using temazepam 1-2x per week.   Plan Continue current medications.    Aspirin therapy   Patient is currently managed on the following medications:  - ASA 81mg  once daily   Plan Continue current medications.   Medication Management   Patient organizes  medications: husbands helps her organize medications and makes she takes her meds.  Barriers: unknown (spouse obtains medications from pharmacy).  Follow up Follow up appointment in 3 months.  Patient referred to schedule annual physical exam since it is overdue. Patient to obtain needed blood work then.   Anson Crofts, PharmD Clinical Pharmacist [facility] [phone]

## 2019-03-28 NOTE — Telephone Encounter (Signed)
I am requesting an ambulatory referral to CCM be placed for this patient. Referral will need to have 2 current diagnosis attached to it. Thank you!  

## 2019-03-28 NOTE — Telephone Encounter (Signed)
Done

## 2019-03-29 NOTE — Patient Instructions (Addendum)
Visit Information  Goals Addressed            This Visit's Progress   . Pharmacy Care Plan       Current Barriers:  . Chronic Disease Management support, education, and care coordination needs related to HTN and DMII, Asthma, GERD, Hypothyroidism, Arthritis, Skin lesions, Anxiety, Insomnia   Pharmacist Clinical Goal(s):  [name] Over the next month, begin/ continue self health monitoring activities as directed today  . Call care management team with questions or concerns.  . With next months, schedule physical exam appointment with Dr. Sarajane Jews and obtain appropriate blood work.  . Diabetes:  o Achieve A1c <7.0% . Within this year, obtain diabetes preventative health exams (eye and foot exams). . Improve lifestyle modifications (plate method; decrease carbohydrate intake).  . Blood pressure:  o Within next couple of months, achieve blood pressure within goal of your provider (130/80 or 140/90).  . Heart burn: Minimize reflux symptoms  . Insomnia: Continue to see improvement in insomnia. . Hypothyroidism: Maintain TSH between 0.45 to 4.5uIU/ml. . Chronic pain: Continue to see improvement in pain level.   Interventions: . Comprehensive medication review performed . Collaboration with provider re: medication management.  . Discussed with Dr. Sarajane Jews pending lab work. You will need to make physical exam appointment to obtain appropriate labs.  . Diabetes o TrueTest test strips were refilled.  o Continue metformin ER 557m, 1 tablet twice daily and glipizide 174m 1 tablet twice daily.  . Blood pressure:  o Discussed need to continue checking blood pressure at home.  o Discussed diet modifications. DASH diet:  following a diet emphasizing fruits and vegetables and low-fat dairy products along with whole grains, fish, poultry, and nuts. Reducing red meats and sugars.  o Continue diltiazem (Cardizem CD) 24077m1 capsule daily, hydrochlorothiazide 30m36m tablet daily, and metoprolol succinate 100mg30m tablet daily.  . High cholesterol:  o Continue atorvastatin 10mg,39mablet daily and fenofibrate 160mg, 59mblet daily.  o Discussed diet high in plant sterols (fruits/vegetables/nuts, whole grains/ legumes) may reduce your cholesterol.  . Chronic pain/muscle spasms:  o Discussed pain regimen and avoiding concomitant use of tramadol and hydromorphone.  o Continue diclofenac 75mg, 130mlet twice daily, tramadol 50mg, 2 28mets three times daily as needed for moderate pain, hydromorphone 4mg, 1 ta40mt every six hours as needed for severe pain, cyclobenzaprine 10mg, 1 ta87m three times as needed for muscle spasms.  . Diabetic neuropathy o Continue gabapentin 300mg, 2 cap20ms three times daily.  . Anxiety o Continue sertraline 100mg, 1.5 ta50ms once daily.  . Insomnia:  o Discussed non-pharmacological interventions for insomnia. (Avoid napping, limit exposure to technology near bedtime, etc). o Continue temazepam 30mg, 1 capsu47mt bedtime as needed.  . Skin lesions o Continue mupirocin 2% ointment, Bactrim DS, 1 tablet twice daily, and triamcinolone 0.1%.  . HypothyroidiMarland Kitchenm:  o Continue levothyroxine 50mcg, 1 table30mily.  . GERD:  o DiscMarland Kitchenssed non-pharmacological interventions for acid reflux. Take measures to prevent acid reflux, such as avoiding spicy foods, avoiding caffeine, avoid laying down a few hours after eating, and raising the head of the bed. o Continue omeprazole 20mg, 1 capsule65mce daily.   Patient Self Care Activities:  . Self administers medications as prescribed and Calls provider office for new concerns or questions . Continue current medications as directed by providers.  . Continue following up with specialists. . Continue at home blood pressure readings. . Continue checking blood sugars.   Initial goal  documentation        Christina [name] was given information about Chronic Care Management services today including:  1. CCM service includes personalized support  from designated clinical staff supervised by her physician, including individualized plan of care and coordination with other care providers 2. 24/7 contact phone numbers for assistance for urgent and routine care needs. 3. Service will only be billed when office clinical staff spend 20 minutes or more in a month to coordinate care. 4. Only one practitioner may furnish and bill the service in a calendar month. 5. The patient may stop CCM services at any time (effective at the end of the month) by phone call to the office staff. 6. The patient will be responsible for cost sharing (co-pay) of up to 20% of the service fee (after annual deductible is met).  Patient agreed to services and verbal consent obtained.   The patient verbalized understanding of instructions provided today and agreed to receive a mailed copy of patient instruction and/or educational materials. Telephone follow up appointment with pharmacy team member scheduled for:  06/28/2019  Anson Crofts, PharmD Clinical Pharmacist [facility] [phone]      Diabetes Mellitus and Standards of Medical Care Managing diabetes (diabetes mellitus) can be complicated. Your diabetes treatment may be managed by a team of health care providers, including:  A physician who specializes in diabetes (endocrinologist).  A nurse practitioner or physician assistant.  Nurses.  A diet and nutrition specialist (registered dietitian).  A certified diabetes educator (CDE).  An exercise specialist.  A pharmacist.  An eye doctor.  A foot specialist (podiatrist).  A dentist.  A primary care provider.  A mental health provider. Your health care providers follow guidelines to help you get the best quality of care. The following schedule is a general guideline for your diabetes management plan. Your health care providers may give you more specific instructions. Physical exams Upon being diagnosed with  diabetes mellitus, and each year after that, your health care provider will ask about your medical and family history. He or she will also do a physical exam. Your exam may include:  Measuring your height, weight, and body mass index (BMI).  Checking your blood pressure. This will be done at every routine medical visit. Your target blood pressure may vary depending on your medical conditions, your age, and other factors.  Thyroid gland exam.  Skin exam.  Screening for damage to your nerves (peripheral neuropathy). This may include checking the pulse in your legs and feet and checking the level of sensation in your hands and feet.  A complete foot exam to inspect the structure and skin of your feet, including checking for cuts, bruises, redness, blisters, sores, or other problems.  Screening for blood vessel (vascular) problems, which may include checking the pulse in your legs and feet and checking your temperature. Blood tests Depending on your treatment plan and your personal needs, you may have the following tests done:  HbA1c (hemoglobin A1c). This test provides information about blood sugar (glucose) control over the previous 2-3 months. It is used to adjust your treatment plan, if needed. This test will be done: ? At least 2 times a year, if you are meeting your treatment goals. ? 4 times a year, if you are not meeting your treatment goals or if treatment goals have changed.  Lipid testing, including total, LDL, and HDL cholesterol and triglyceride levels. ? The goal for LDL is less than 100 mg/dL (  5.5 mmol/L). If you are at high risk for complications, the goal is less than 70 mg/dL (3.9 mmol/L). ? The goal for HDL is 40 mg/dL (2.2 mmol/L) or higher for men and 50 mg/dL (2.8 mmol/L) or higher for women. An HDL cholesterol of 60 mg/dL (3.3 mmol/L) or higher gives some protection against heart disease. ? The goal for triglycerides is less than 150 mg/dL (8.3 mmol/L).  Liver function  tests.  Kidney function tests.  Thyroid function tests. Dental and eye exams  Visit your dentist two times a year.  If you have type 1 diabetes, your health care provider may recommend an eye exam 3-5 years after you are diagnosed, and then once a year after your first exam. ? For children with type 1 diabetes, a health care provider may recommend an eye exam when your child is age 5 or older and has had diabetes for 3-5 years. After the first exam, your child should get an eye exam once a year.  If you have type 2 diabetes, your health care provider may recommend an eye exam as soon as you are diagnosed, and then once a year after your first exam. Immunizations   The yearly flu (influenza) vaccine is recommended for everyone 6 months or older who has diabetes.  The pneumonia (pneumococcal) vaccine is recommended for everyone 2 years or older who has diabetes. If you are 75 or older, you may get the pneumonia vaccine as a series of two separate shots.  The hepatitis B vaccine is recommended for adults shortly after being diagnosed with diabetes.  Adults and children with diabetes should receive all other vaccines according to age-specific recommendations from the Centers for Disease Control and Prevention (CDC). Mental and emotional health Screening for symptoms of eating disorders, anxiety, and depression is recommended at the time of diagnosis and afterward as needed. If your screening shows that you have symptoms (positive screening result), you may need more evaluation and you may work with a mental health care provider. Treatment plan Your treatment plan will be reviewed at every medical visit. You and your health care provider will discuss:  How you are taking your medicines, including insulin.  Any side effects you are experiencing.  Your blood glucose target goals.  The frequency of your blood glucose monitoring.  Lifestyle habits, such as activity level as well as  tobacco, alcohol, and substance use. Diabetes self-management education Your health care provider will assess how well you are monitoring your blood glucose levels and whether you are taking your insulin correctly. He or she may refer you to:  A certified diabetes educator to manage your diabetes throughout your life, starting at diagnosis.  A registered dietitian who can create or review your personal nutrition plan.  An exercise specialist who can discuss your activity level and exercise plan. Summary  Managing diabetes (diabetes mellitus) can be complicated. Your diabetes treatment may be managed by a team of health care providers.  Your health care providers follow guidelines in order to help you get the best quality of care.  Standards of care including having regular physical exams, blood tests, blood pressure monitoring, immunizations, screening tests, and education about how to manage your diabetes.  Your health care providers may also give you more specific instructions based on your individual health. This information is not intended to replace advice given to you by your health care provider. Make sure you discuss any questions you have with your health care provider. Document Revised: 10/02/2017 Document  Reviewed: 10/12/2015 Elsevier Patient Education  [place] DASH stands for "Dietary Approaches to Stop Hypertension." The DASH eating plan is a healthy eating plan that has been shown to reduce high blood pressure (hypertension). It may also reduce your risk for type 2 diabetes, heart disease, and stroke. The DASH eating plan may also help with weight loss. What are tips for following this plan?  General guidelines  Avoid eating more than 2,300 mg (milligrams) of salt (sodium) a day. If you have hypertension, you may need to reduce your sodium intake to 1,500 mg a day.  Limit alcohol intake to no more than 1 drink a day for nonpregnant women and 2  drinks a day for men. One drink equals 12 oz of beer, 5 oz of wine, or 1 oz of hard liquor.  Work with your health care provider to maintain a healthy body weight or to lose weight. Ask what an ideal weight is for you.  Get at least 30 minutes of exercise that causes your heart to beat faster (aerobic exercise) most days of the week. Activities may include walking, swimming, or biking.  Work with your health care provider or diet and nutrition specialist (dietitian) to adjust your eating plan to your individual calorie needs. Reading food labels   Check food labels for the amount of sodium per serving. Choose foods with less than 5 percent of the Daily Value of sodium. Generally, foods with less than 300 mg of sodium per serving fit into this eating plan.  To find whole grains, look for the word "whole" as the first word in the ingredient list. Shopping  Buy products labeled as "low-sodium" or "no salt added."  Buy fresh foods. Avoid canned foods and premade or frozen meals. Cooking  Avoid adding salt when cooking. Use salt-free seasonings or herbs instead of table salt or sea salt. Check with your health care provider or pharmacist before using salt substitutes.  Do not fry foods. Cook foods using healthy methods such as baking, boiling, grilling, and broiling instead.  Cook with heart-healthy oils, such as olive, canola, soybean, or sunflower oil. Meal planning  Eat a balanced diet that includes: ? 5 or more servings of fruits and vegetables each day. At each meal, try to fill half of your plate with fruits and vegetables. ? Up to 6-8 servings of whole grains each day. ? Less than 6 oz of lean meat, poultry, or fish each day. A 3-oz serving of meat is about the same size as a deck of cards. One egg equals 1 oz. ? 2 servings of low-fat dairy each day. ? A serving of nuts, seeds, or beans 5 times each week. ? Heart-healthy fats. Healthy fats called Omega-3 fatty acids are found in  foods such as flaxseeds and coldwater fish, like sardines, salmon, and mackerel.  Limit how much you eat of the following: ? Canned or prepackaged foods. ? Food that is high in trans fat, such as fried foods. ? Food that is high in saturated fat, such as fatty meat. ? Sweets, desserts, sugary drinks, and other foods with added sugar. ? Full-fat dairy products.  Do not salt foods before eating.  Try to eat at least 2 vegetarian meals each week.  Eat more home-cooked food and less restaurant, buffet, and fast food.  When eating at a restaurant, ask that your food be prepared with less salt or no salt, if possible. What foods are recommended? The items listed  may not be a complete list. Talk with your dietitian about what dietary choices are best for you. Grains Whole-grain or whole-wheat bread. Whole-grain or whole-wheat pasta. Brown rice. [place]. Bulgur. Whole-grain and low-sodium cereals. Pita bread. Low-fat, low-sodium crackers. Whole-wheat flour tortillas. Vegetables Fresh or frozen vegetables (raw, steamed, roasted, or grilled). Low-sodium or reduced-sodium tomato and vegetable juice. Low-sodium or reduced-sodium tomato sauce and tomato paste. Low-sodium or reduced-sodium canned vegetables. Fruits All fresh, dried, or frozen fruit. Canned fruit in natural juice (without added sugar). Meat and other protein foods Skinless chicken or [place]. Ground chicken or [place]. Pork with fat trimmed off. Fish and seafood. Egg whites. Dried beans, peas, or lentils. Unsalted nuts, nut butters, and seeds. Unsalted canned beans. Lean cuts of beef with fat trimmed off. Low-sodium, lean deli meat. Dairy Low-fat (1%) or fat-free (skim) milk. Fat-free, low-fat, or reduced-fat cheeses. Nonfat, low-sodium ricotta or cottage cheese. Low-fat or nonfat yogurt. Low-fat, low-sodium cheese. Fats and oils Soft margarine without trans fats. Vegetable oil. Low-fat, reduced-fat, or light mayonnaise and salad  dressings (reduced-sodium). Canola, safflower, olive, soybean, and sunflower oils. Avocado. Seasoning and other foods Herbs. Spices. Seasoning mixes without salt. Unsalted popcorn and pretzels. Fat-free sweets. What foods are not recommended? The items listed may not be a complete list. Talk with your dietitian about what dietary choices are best for you. Grains Baked goods made with fat, such as croissants, muffins, or some breads. Dry pasta or rice meal packs. Vegetables Creamed or fried vegetables. Vegetables in a cheese sauce. Regular canned vegetables (not low-sodium or reduced-sodium). Regular canned tomato sauce and paste (not low-sodium or reduced-sodium). Regular tomato and vegetable juice (not low-sodium or reduced-sodium). Angie Fava. Olives. Fruits Canned fruit in a light or heavy syrup. Fried fruit. Fruit in cream or butter sauce. Meat and other protein foods Fatty cuts of meat. Ribs. Fried meat. Berniece Salines. Sausage. Bologna and other processed lunch meats. Salami. Fatback. Hotdogs. Bratwurst. Salted nuts and seeds. Canned beans with added salt. Canned or smoked fish. Whole eggs or egg yolks. Chicken or [place] with skin. Dairy Whole or 2% milk, cream, and half-and-half. Whole or full-fat cream cheese. Whole-fat or sweetened yogurt. Full-fat cheese. Nondairy creamers. Whipped toppings. Processed cheese and cheese spreads. Fats and oils Butter. Stick margarine. Lard. Shortening. Ghee. Bacon fat. Tropical oils, such as coconut, palm kernel, or palm oil. Seasoning and other foods Salted popcorn and pretzels. Onion salt, garlic salt, seasoned salt, table salt, and sea salt. Worcestershire sauce. Tartar sauce. Barbecue sauce. Teriyaki sauce. Soy sauce, including reduced-sodium. Steak sauce. Canned and packaged gravies. Fish sauce. Oyster sauce. Cocktail sauce. Horseradish that you find on the shelf. Ketchup. Mustard. Meat flavorings and tenderizers. Bouillon cubes. Hot sauce and Tabasco sauce.  Premade or packaged marinades. Premade or packaged taco seasonings. Relishes. Regular salad dressings. Where to find more information:  National Heart, Lung, and [place]: https://wilson-eaton.com/  American Heart Association: www.heart.org Summary  The DASH eating plan is a healthy eating plan that has been shown to reduce high blood pressure (hypertension). It may also reduce your risk for type 2 diabetes, heart disease, and stroke.  With the DASH eating plan, you should limit salt (sodium) intake to 2,300 mg a day. If you have hypertension, you may need to reduce your sodium intake to 1,500 mg a day.  When on the DASH eating plan, aim to eat more fresh fruits and vegetables, whole grains, lean proteins, low-fat dairy, and heart-healthy fats.  Work with your health care provider or diet  and nutrition specialist (dietitian) to adjust your eating plan to your individual calorie needs. This information is not intended to replace advice given to you by your health care provider. Make sure you discuss any questions you have with your health care provider. Document Revised: 12/26/2016 Document Reviewed: 01/07/2016 Elsevier Patient Education  2020 [employer].

## 2019-03-30 ENCOUNTER — Telehealth: Payer: Self-pay

## 2019-03-30 MED ORDER — TRUETEST TEST VI STRP: ORAL_STRIP | 1 refills | Status: DC

## 2019-03-30 NOTE — Telephone Encounter (Signed)
I had CCM visit with patient and she is requesting refill for TrueTest test strips. Can you send a refill to [facility]? (currently don't have clearance to send this in). Thanks!

## 2019-03-30 NOTE — Telephone Encounter (Signed)
Rx has been sent. Left a detailed message on verified voice mail.

## 2019-04-02 ENCOUNTER — Other Ambulatory Visit: Payer: Self-pay | Admitting: Family Medicine

## 2019-04-18 ENCOUNTER — Other Ambulatory Visit: Payer: Self-pay | Admitting: Family Medicine

## 2019-04-20 ENCOUNTER — Encounter: Payer: PPO | Admitting: Family Medicine

## 2019-04-24 ENCOUNTER — Other Ambulatory Visit: Payer: Self-pay | Admitting: Family Medicine

## 2019-04-28 ENCOUNTER — Ambulatory Visit: Payer: Self-pay

## 2019-04-28 ENCOUNTER — Other Ambulatory Visit: Payer: Self-pay

## 2019-04-28 NOTE — Chronic Care Management (AMB) (Signed)
  Chronic Care Management Pharmacy  Name: Christina [name]  MRN: [id number] DOB: [birth year]  Called patient to follow up and review blood pressure and blood glucose readings.   Hypertension At last CCM visit, patient reported at home BP reading: 166/18mmHg (03/28/2019)   Patient reported taking:  - Diltiazem 240mg , 1 capsule once daily - HCTZ 25mg , 1 tablet once daily - Metoprolol succinate (Toprol XL) 100mg , 1 tablet once daily  Patient reports not checking BP lately due to decrease incidence of headaches.   During call patient checked BP.  BP: 156/64mmHg. Pulse: 88 bpm.   - patient stated pain is about the same, but headaches have decreased. She mentioned she switched from drinking diet Dr. Malachi Bonds to Sprite Zero.     Diabetes  At last visit, patient was unable to check BGs due to needing refill of test strips. Since then, test strips have been refilled and [facility].   Patient on:  - glipizide 10mg , 1 tablet twice daily - metformin XR 500mg  1 tablet twice daily   - patient reported not picking up test strips. Patient appreciated reminder and will send husband to pick up refill.     Plan  Instructed patient to check BP daily and report at upcoming appt.  Patient has physical appt with Dr. Sarajane Jews on 05/02/2019. Will provide follow up call in 1 month for BP reassessment and therapy modification recommendations.

## 2019-05-02 ENCOUNTER — Encounter: Payer: Self-pay | Admitting: Family Medicine

## 2019-05-02 ENCOUNTER — Ambulatory Visit (INDEPENDENT_AMBULATORY_CARE_PROVIDER_SITE_OTHER): Payer: PPO | Admitting: Family Medicine

## 2019-05-02 ENCOUNTER — Other Ambulatory Visit: Payer: Self-pay

## 2019-05-02 VITALS — BP 140/80 | HR 86 | Temp 97.3°F | Ht 62.5 in | Wt 218.4 lb

## 2019-05-02 DIAGNOSIS — E1165 Type 2 diabetes mellitus with hyperglycemia: Secondary | ICD-10-CM

## 2019-05-02 DIAGNOSIS — Z0000 Encounter for general adult medical examination without abnormal findings: Secondary | ICD-10-CM

## 2019-05-02 DIAGNOSIS — E038 Other specified hypothyroidism: Secondary | ICD-10-CM | POA: Diagnosis not present

## 2019-05-02 DIAGNOSIS — G4730 Sleep apnea, unspecified: Secondary | ICD-10-CM

## 2019-05-02 DIAGNOSIS — L989 Disorder of the skin and subcutaneous tissue, unspecified: Secondary | ICD-10-CM | POA: Diagnosis not present

## 2019-05-02 LAB — LIPID PANEL
Cholesterol: 191 mg/dL (ref 0–200)
HDL: 48.3 mg/dL (ref >39.00)
NonHDL: 142.84
Total CHOL/HDL Ratio: 4
Triglycerides: 268 mg/dL — ABNORMAL HIGH (ref 0.0–149.0)
VLDL: 53.6 mg/dL — ABNORMAL HIGH (ref 0.0–40.0)

## 2019-05-02 LAB — HEPATIC FUNCTION PANEL
ALT: 20 U/L (ref 0–35)
AST: 20 U/L (ref 0–37)
Albumin: 4.6 g/dL (ref 3.5–5.2)
Alkaline Phosphatase: 48 U/L (ref 39–117)
Bilirubin, Direct: 0.1 mg/dL (ref 0.0–0.3)
Total Bilirubin: 0.4 mg/dL (ref 0.2–1.2)
Total Protein: 7.1 g/dL (ref 6.0–8.3)

## 2019-05-02 LAB — CBC WITH DIFFERENTIAL/PLATELET
Basophils Absolute: 0 10*3/uL (ref 0.0–0.1)
Basophils Relative: 0.8 % (ref 0.0–3.0)
Eosinophils Absolute: 0.1 10*3/uL (ref 0.0–0.7)
Eosinophils Relative: 1.4 % (ref 0.0–5.0)
HCT: 37.8 % (ref 36.0–46.0)
Hemoglobin: 12.3 g/dL (ref 12.0–15.0)
Lymphocytes Relative: 20.1 % (ref 12.0–46.0)
Lymphs Abs: 1.1 10*3/uL (ref 0.7–4.0)
MCHC: 32.6 g/dL (ref 30.0–36.0)
MCV: 76.7 fl — ABNORMAL LOW (ref 78.0–100.0)
Monocytes Absolute: 0.2 10*3/uL (ref 0.1–1.0)
Monocytes Relative: 4.2 % (ref 3.0–12.0)
Neutro Abs: 4 10*3/uL (ref 1.4–7.7)
Neutrophils Relative %: 73.5 % (ref 43.0–77.0)
Platelets: 145 10*3/uL — ABNORMAL LOW (ref 150.0–400.0)
RBC: 4.93 Mil/uL (ref 3.87–5.11)
RDW: 16.1 % — ABNORMAL HIGH (ref 11.5–15.5)
WBC: 5.5 10*3/uL (ref 4.0–10.5)

## 2019-05-02 LAB — BASIC METABOLIC PANEL
BUN: 24 mg/dL — ABNORMAL HIGH (ref 6–23)
CO2: 32 mEq/L (ref 19–32)
Calcium: 9.1 mg/dL (ref 8.4–10.5)
Chloride: 99 mEq/L (ref 96–112)
Creatinine, Ser: 1.21 mg/dL — ABNORMAL HIGH (ref 0.40–1.20)
GFR: 46.25 mL/min — ABNORMAL LOW (ref >60.00)
Glucose, Bld: 149 mg/dL — ABNORMAL HIGH (ref 70–99)
Potassium: 4.6 mEq/L (ref 3.5–5.1)
Sodium: 140 mEq/L (ref 135–145)

## 2019-05-02 LAB — T3, FREE: T3, Free: 3.8 pg/mL (ref 2.3–4.2)

## 2019-05-02 LAB — T4, FREE: Free T4: 0.76 ng/dL (ref 0.60–1.60)

## 2019-05-02 LAB — TSH: TSH: 1.91 u[IU]/mL (ref 0.35–4.50)

## 2019-05-02 LAB — HEMOGLOBIN A1C: Hgb A1c MFr Bld: 9.3 % — ABNORMAL HIGH (ref 4.6–6.5)

## 2019-05-02 LAB — LDL CHOLESTEROL, DIRECT: Direct LDL: 107 mg/dL

## 2019-05-02 MED ORDER — DILTIAZEM HCL ER COATED BEADS 360 MG PO CP24: 360.0000 mg | ORAL_CAPSULE | Freq: Every day | ORAL | 3 refills | Status: DC

## 2019-05-02 NOTE — Progress Notes (Signed)
Subjective:    Patient ID: Christina [name], female    DOB: [birth year], 54 y.o.   MRN: [id number]  HPI Here for a well exam. She has several issues to discuss. First she has had a spot on the left side of her nose for about a year, but it recently crusted up and squeezed some fluid out of it. It is not tender. Second her BP has been running high at home, usually in the range of 160-190/ 80-100. No chest pain or SOB or headache. Her heart rates have been stable in the 70s or 80s. Third, she has been very fatigued for several months and she admits to not sleeping well. She snores a lot. I have suspected sleep apnea  for years but she has always declined having this evaluated. Now she wants to pursue this.    Review of Systems  Constitutional: Positive for fatigue.  HENT: Negative.   Eyes: Negative.   Respiratory: Negative.   Cardiovascular: Negative.   Gastrointestinal: Negative.   Genitourinary: Negative for decreased urine volume, difficulty urinating, dyspareunia, dysuria, enuresis, flank pain, frequency, hematuria, pelvic pain and urgency.  Musculoskeletal: Negative.   Skin: Positive for wound.  Neurological: Negative.   Psychiatric/Behavioral: Negative.        Objective:   Physical Exam Constitutional:      General: She is not in acute distress.    Appearance: She is well-developed. She is obese.     Comments: In her wheelchair   HENT:     Head: Normocephalic and atraumatic.     Right Ear: External ear normal.     Left Ear: External ear normal.     Nose: Nose normal.     Mouth/Throat:     Pharynx: No oropharyngeal exudate.  Eyes:     General: No scleral icterus.    Conjunctiva/sclera: Conjunctivae normal.     Pupils: Pupils are equal, round, and reactive to light.  Neck:     Thyroid: No thyromegaly.     Vascular: No JVD.  Cardiovascular:     Rate and Rhythm: Normal rate and regular rhythm.     Heart sounds: Normal heart sounds. No murmur. No friction rub. No gallop.    Pulmonary:     Effort: Pulmonary effort is normal. No respiratory distress.     Breath sounds: Normal breath sounds. No wheezing or rales.  Chest:     Chest wall: No tenderness.  Abdominal:     General: Bowel sounds are normal. There is no distension.     Palpations: Abdomen is soft. There is no mass.     Tenderness: There is no abdominal tenderness. There is no guarding or rebound.  Musculoskeletal:        General: No tenderness. Normal range of motion.     Cervical back: Normal range of motion and neck supple.  Lymphadenopathy:     Cervical: No cervical adenopathy.  Skin:    General: Skin is warm and dry.     Comments: The left nose just below the bridge has a small red lesion with a raised border   Neurological:     Mental Status: She is alert and oriented to person, place, and time.     Cranial Nerves: No cranial nerve deficit.     Motor: No abnormal muscle tone.     Coordination: Coordination normal.     Deep Tendon Reflexes: Reflexes are normal and symmetric. Reflexes normal.  Psychiatric:        Behavior:  Behavior normal.        Thought Content: Thought content normal.        Judgment: Judgment normal.           Assessment & Plan:  Well exam. We discussed diet and exercise. Get fasting labs. She likely has a basal cell cancer on the nose, so we will refer to Dermatology to evaluate. Refer to Pulmonary to work up sleep apnea. For the HTN, we will increase Diltiazem CD to 360 mg daily.  Alysia Penna, MD

## 2019-05-04 ENCOUNTER — Other Ambulatory Visit: Payer: Self-pay | Admitting: Family Medicine

## 2019-05-15 ENCOUNTER — Other Ambulatory Visit: Payer: Self-pay | Admitting: Family Medicine

## 2019-05-20 ENCOUNTER — Other Ambulatory Visit: Payer: Self-pay | Admitting: Family Medicine

## 2019-05-24 NOTE — Telephone Encounter (Signed)
Pt still has refills on fenofibrate and atorvastatin  Dilaudid has not been refilled since 01/2017  Keflex was filled for folliculitis [id number]  Please advise

## 2019-06-06 ENCOUNTER — Other Ambulatory Visit: Payer: Self-pay | Admitting: Family Medicine

## 2019-06-07 NOTE — Telephone Encounter (Signed)
Tramadol last filled 11/30/2018 Last OV 05/02/2019  Ok to fill?

## 2019-06-17 ENCOUNTER — Other Ambulatory Visit: Payer: Self-pay | Admitting: Family Medicine

## 2019-06-28 ENCOUNTER — Telehealth: Payer: PPO

## 2019-06-28 ENCOUNTER — Other Ambulatory Visit: Payer: Self-pay | Admitting: Family Medicine

## 2019-07-06 ENCOUNTER — Ambulatory Visit: Payer: PPO

## 2019-07-06 ENCOUNTER — Other Ambulatory Visit: Payer: Self-pay

## 2019-07-06 DIAGNOSIS — E781 Pure hyperglyceridemia: Secondary | ICD-10-CM

## 2019-07-06 DIAGNOSIS — E1165 Type 2 diabetes mellitus with hyperglycemia: Secondary | ICD-10-CM

## 2019-07-06 DIAGNOSIS — F411 Generalized anxiety disorder: Secondary | ICD-10-CM

## 2019-07-06 DIAGNOSIS — I10 Essential (primary) hypertension: Secondary | ICD-10-CM

## 2019-07-06 NOTE — Chronic Care Management (AMB) (Signed)
Chronic Care Management Pharmacy  Name: Christina [name]  MRN: [id number] DOB: 1965-05-21  Initial Questions: 1. Have you seen any other providers since your last visit? Yes  2. Any changes in your medicines or health? Yes - diltiazem dose was increased.  Chief Complaint/ HPI  Christina [name],  54 y.o. , female presents for their Follow-Up CCM visit with the clinical pharmacist via telephone due to COVID-19 Pandemic.  PCP : Laurey Morale, MD  Patient wants to keep on track with health. But states it is hard to make changes especially diet. Cost is a big concern when making medication changes. She states her husband takes care of her and recently he underwent surgery which makes it difficult to schedule in-person appointments at this time.   Their chronic conditions include: HTN, Asthma, GERD, DM, Hypothyroidism, Arthritis, skin lesions, vitamin B12 deficiency, anxiety, insomnia  Office Visits: 05/02/2019- Alysia Penna, MD- Patient presented for office visit for well exam. Patient to obtain fasting labs. Patient referred to dermatology for likely basal cell cancer on nose. Patient referred to pulmonary for sleep apnea work up. For reported elevated home BP readings, diltiazem CD increased to 360mg  daily.   11/30/2018- Patient presented for office visit with Dr. Alysia Penna, MD for follow up. Patient's HTN and asthma are stable. Dose of tramadol increased to every 6 hours as needed for back pain. Patient instructed to decrease carb intake for her diabetes.   03/17/2018- Patient presented for office visit with Dr. Alysia Penna, MD for DM follow up. Patient was prescribed Januvia, previous A1c in Nov: 8.0%; however, patient was unable to afford it. Full thyroid panel ordered since patient complained for hair loss. She was given Keflex for folliculitis on scalp.   Consult Visit: 02/18/2018- Orthopedics- Patient presented to Dwana Melena, PA for s/p achilles tendon rupture. Patient to cover wound with  mupirocin. Patient to use cam walker as opposed to barefoot. Follow up in 3 weeks for recheck.   Medications: Outpatient Encounter Medications as of 07/06/2019  Medication Sig  . aspirin 81 MG EC tablet Take 81 mg by mouth daily.    [name] atorvastatin (LIPITOR) 10 MG tablet TAKE 1 TABLET BY MOUTH EVERY DAY  . cholecalciferol (VITAMIN D) 1000 UNITS tablet Take 1,000 Units by mouth daily.  . [facility] ULTRA THIN LANCETS MISC 1 strip by Does not apply route as directed.  . diltiazem (CARDIZEM CD) 240 MG 24 hr capsule TAKE 1 CAPSULE BY MOUTH EVERY DAY  . fenofibrate 160 MG tablet TAKE 1 TABLET BY MOUTH DAILY  . glipiZIDE (GLUCOTROL) 10 MG tablet TAKE 1 TABLET BY MOUTH TWICE A DAY BEFORE A MEAL  . glucose blood (TRUETEST TEST) test strip Use as instructed  . hydrochlorothiazide (HYDRODIURIL) 25 MG tablet TAKE 1 TABLET BY MOUTH EVERY DAY  . metFORMIN (GLUCOPHAGE-XR) 500 MG 24 hr tablet TAKE 1 TABLET BY MOUTH TWICE A DAY WITH MEAL  . metoprolol succinate (TOPROL-XL) 100 MG 24 hr tablet TAKE 1 TABLET BY MOUTH DAILY. TAKE WITH OR IMMEDIATELY FOLLOWING A MEAL  . sertraline (ZOLOFT) 100 MG tablet TAKE 1.5 TABLETS BY MOUTH DAILY  . triamcinolone cream (KENALOG) 0.1 % Apply 1 application topically 2 (two) times daily.  . cephALEXin (KEFLEX) 500 MG capsule TAKE 1 CAPSULE BY MOUTH THREE TIMES A DAY  . Cinnamon 500 MG TABS Take 2 tablets by mouth daily.  . clobetasol (TEMOVATE) 0.05 % external solution Apply 1 application topically 2 (two) times daily.  . Cyanocobalamin (VITAMIN B  12 PO) Take 1,000 Units by mouth daily.  . cyclobenzaprine (FLEXERIL) 10 MG tablet TAKE 1 TABLET BY MOUTH THREE TIMES A DAY AS NEEDED FOR MUSCLE SPASMS  . desonide (DESOWEN) 0.05 % cream Apply topically 2 (two) times daily.    . diclofenac (VOLTAREN) 75 MG EC tablet TAKE 1 TABLET BY MOUTH TWICE A DAY  . diltiazem (CARDIZEM CD) 360 MG 24 hr capsule Take 1 capsule (360 mg total) by mouth daily. (Patient not taking: Reported on 07/11/2019)  .  diphenhydrAMINE (BENADRYL) 25 mg capsule Take 25 mg by mouth as needed.    . gabapentin (NEURONTIN) 300 MG capsule TAKE 2 CAPSULES (600 MG TOTAL) BY MOUTH 3 (THREE) TIMES DAILY.  [name] HYDROmorphone (DILAUDID) 4 MG tablet Take 1 tablet (4 mg total) by mouth every 6 (six) hours as needed for severe pain.  [name] levothyroxine (SYNTHROID) 50 MCG tablet TAKE 1 TABLET (50 MCG TOTAL) BY MOUTH DAILY.  . mupirocin ointment (BACTROBAN) 2 % Apply topically 3 (three) times daily.  [name] omeprazole (PRILOSEC) 20 MG capsule Take 1 capsule (20 mg total) by mouth 2 (two) times daily.  . potassium chloride (KLOR-CON M10) 10 MEQ tablet TAKE 1 TABLET (10 MEQ TOTAL) BY MOUTH DAILY.  [name] promethazine (PHENERGAN) 25 MG tablet Take 1 tablet (25 mg total) by mouth every 6 (six) hours as needed for nausea.  . sitaGLIPtin (JANUVIA) 100 MG tablet Take 1 tablet (100 mg total) by mouth daily. (Patient not taking: Reported on 07/11/2019)  . sulfamethoxazole-trimethoprim (BACTRIM DS) 800-160 MG tablet Take 1 tablet by mouth 2 (two) times daily.  . temazepam (RESTORIL) 30 MG capsule Take 1 capsule (30 mg total) by mouth at bedtime as needed for sleep.  . traMADol (ULTRAM) 50 MG tablet TAKE 2 TABLETS BY MOUTH 3 TIMES DAILY AS NEEDED FOR MODERATE PAIN  . vitamin B-12 (CYANOCOBALAMIN) 1000 MCG tablet Take 1,000 mcg by mouth daily. 2500 mcg once daily  . vitamin E (VITAMIN E) 180 MG (400 UNITS) capsule Take 400 Units by mouth daily.  . [DISCONTINUED] potassium chloride (KLOR-CON 10) 10 MEQ CR tablet Take 1 tablet (10 mEq total) by mouth daily.   No facility-administered encounter medications on file as of 07/06/2019.    Current Diagnosis/Assessment:  Goals Addressed            This Visit's Progress   . Pharmacy Care Plan       CARE PLAN ENTRY  Current Barriers:  . Chronic Disease Management support, education, and care coordination needs related to Hypertension, Hyperlipidemia, Diabetes, and Anxiety   Hypertension . Pharmacist  Clinical Goal(s): o Over the next 30 days, patient will work with PharmD and providers to achieve BP goal <130/80 . Current regimen:  o diltiazem (Cardizem CD) 360mg , 1 capsule daily o HCTZ 25mg  ,1 tablet daily o metoprolol succinate 100mg , 1 tablet daily  . Interventions: o Recommend stopping diltiazem CD 240mg  and start 360mg  capsule.  . Patient self care activities - Over the next 30 days, patient will: o Check BP as instructed, document, and provide at future appointments o Ensure daily salt intake < 2300 mg/day  Hyperlipidemia/ hypertriglyceridemia . Pharmacist Clinical Goal(s): o Over the next 30 days, patient will work with PharmD and providers to achieve LDL goal < 30 . Current regimen:  o atorvastatin 10mg , 1 tablet daily o fenofibrate 160mg , 1 tablet daily  . Interventions: o Discussed lifestyle modifications (diet).  o Provided resources to obtain fruits/ vegetables.  . Patient self care activities -  Over the next 30 days, patient will: o Work on incorporating more fruits and vegetables in diet.   Diabetes . Pharmacist Clinical Goal(s): o Over the next 30 days, patient will work with PharmD and providers to maintain A1c goal <7% . Current regimen:  o glipizide 10mg , 1 tablet twice daily o metformin XR 500mg  1 tablet twice daily  . Interventions: o Recommend starting Ozempic injection.  . Patient self care activities - Over the next 30 days, patient will: o Check blood sugar as directed, document, and provide at future appointments  Anxiety  . Pharmacist Clinical Goal(s) o Over the next 7 days, patient will work with PharmD and providers to address concerns and provide further management.  . Current regimen:  o sertraline 100mg , 1.5 tablets once daily . Interventions: o Discussed with Dr. Sarajane Jews. Recommends making a visit with him to discuss further and explore options.  . Patient self care activities - Over the next 7 days, patient will: o Call office to make visit  appointment with Dr. Sarajane Jews.   Medication management . Pharmacist Clinical Goal(s): o Over the next 30 days, patient will work with PharmD and providers to achieve optimal medication adherence . [facility] . Interventions o Comprehensive medication review performed. o Continue current medication management strategy . Patient self care activities - Over the next 30 days, patient will: o Take medications as prescribed o Report any questions or concerns to PharmD and/or provider(s)  Please see past updates related to this goal by clicking on the "Past Updates" button in the selected goal          Diabetes   Recent Relevant Labs: Lab Results  Component Value Date/Time   HGBA1C 9.3 (H) 05/02/2019 02:51 PM   HGBA1C 7.9 (A) 11/30/2018 02:11 PM   HGBA1C 7.5 (H) 03/17/2018 04:39 PM   MICROALBUR 2.5 (H) 02/21/2014 02:22 PM   MICROALBUR 1.7 10/05/2013 10:42 AM    Checking BGs: about 2-3 times per week.   Recent BGs readings: 273 mg/dl (5 minutes ago). Patient reports having pizza last night.   Patient has failed these meds in past: none   Patient is currently uncontrolled on the following medications:  - glipizide 10mg , 1 tablet twice daily - metformin XR 500mg  1 tablet twice daily    Last diabetic Eye exam: No results found for: HMDIABEYEEXA  - Patient notes last eye exam was 4 years ago. Counseled importance of diabetic eye exam every 1 to 2 years.  Last diabetic Foot exam: No results found for: HMDIABFOOTEX  - Discussed foot exams at least once yearly. Patient endorses daily feet inspections at home.   We discussed:    Diet: patient endorses eating a lot of potatoes, oatmeal, Cheerios. She is aware of not eating correct food choices. Discussed plate method and limiting carbs/ starches per meal.   Exercise: patient notes she is unable to exercise since she suffers from chronic pain.   Plan  Patient declined referral to endocrinologist at this time (waiting for  husband to get well since he has a recent surgery).  Discussed with Dr. Sarajane Jews. Agreed to start patient on Ozempic.  Will work with patient on patient assistance.   Hyperlipidemia   Lipid Panel     Component Value Date/Time   CHOL 191 05/02/2019 1451   TRIG 268.0 (H) 05/02/2019 1451   TRIG 160 (H) 12/30/2005 1425   HDL 48.30 05/02/2019 1451   CHOLHDL 4 05/02/2019 1451   VLDL 53.6 (H) 05/02/2019 1451  [place] 76 04/01/2017 1544   LDLDIRECT 107.0 05/02/2019 1451    The 10-year ASCVD risk score Mikey Bussing DC Jr., et al., 2013) is: 6%  Patient has failed these meds in past: none   Patient is currently controlled on the following medications:  - atorvastatin 10mg , 1 tablet daily - fenofibrate 160mg , 1 tablet daily   We discussed:  diet and exercise extensively - How to reduce cholesterol through diet/weight management and physical activity.    - We discussed how a diet high in plant sterols (fruits/vegetables/nuts/whole grains/legumes) may reduce your cholesterol.   - Encouraged increasing fiber to a daily intake of 10-25g/day   Plan Continue current medications.  Patient due for lipid panel.   Hypertension  BP today is: >140/41mmHg   Patient stated still using diltiazem 240mg  when it was increased to 360mg . Did not want to waste pills.   Office blood pressures are  BP Readings from Last 3 Encounters:  05/02/19 140/80  11/30/18 (!) 160/100  03/17/18 (!) 160/82  Patient has failed these meds in the past:   Patient checks BP at home 1-2x per week  Patient home BP readings are ranging: 166/91 mmHg (patient states pain elevates BP). Pulse: 83 bpm   180/90  HR: 80 bpm.   Patient is uncontrolled on:   diltiazem (Cardizem CD) 360mg , 1 capsule daily  HCTZ 25mg  ,1 tablet daily  metoprolol succinate 100mg , 1 tablet daily   We discussed diet and exercise extensively  - states pain is high. Has headache a lot.   Plan Patient to start taking diltiazem (Cardizem CD) 360mg , 1  capsule once daily (previously prescribed by Dr. Sarajane Jews).  Continue current medications   Hypothyroidism   TSH  Date Value Ref Range Status  05/02/2019 1.91 0.35 - 4.50 uIU/mL Final    No components found for: T4  Patient has failed these meds in past: none  Patient is currently controlled on the following medications:  - levothyroxine 80mcg, 1 tablet daily   Plan Continue current medications  Asthma  Patient notes she was diagnosed with asthma over 20 years ago. Working conditions made breathing worse. She denies having any breathing problems at the moment.   Eosinophil count:   Lab Results  Component Value Date/Time   EOSPCT 1.4 05/02/2019 02:51 PM  %                               Eos (Absolute):  Lab Results  Component Value Date/Time   EOSABS 0.1 05/02/2019 02:51 PM   Tobacco Status:  Social History   Tobacco Use  Smoking Status Former Smoker  Smokeless Tobacco Never Used  - Patient endorse stopped smoking 12-14 years ago.   Patient has failed these meds in past: patient does not remember names of inhalers ("made her heart do weird things and sent her into panic").   Patient is currently controlled on the following medications:  - currently not on any inhalers.   Using maintenance inhaler regularly? No Frequency of rescue inhaler use:  never  We discussed:  discussed having rescue inhalers on hand (selective beta-2 agonist; i.e. Xopenex)  Plan Patient wants to continue as is.   GERD  Patient has failed these meds in past: none   Patient is currently controlled on the following medications:  - omeprazole 20mg , 1 capsule twice daily    We discussed:  Discussed non-pharmacological interventions for acid reflux. Take measures to prevent acid reflux,  such as avoiding spicy foods, avoiding caffeine, avoid laying down a few hours after eating, and raising the head of the bed.  Plan Continue current medications.    Vitamin B12 deficiency   Patient has failed  these meds in past: none  Patient is currently managed on the following medications:  - cyanocobalamin 1000 units daily   Plan Continue current medications. Recommend vitamin B12 level.   Arthritis/ muscle spasms    Patient has failed these meds in past: Opana (oxymorphone)  Patient is currently controlled on the following medications: - diclofenac 75mg , 1 tablet twice daily  - tramadol 50mg , 2 tablets three times daily as needed for moderate pain  - hydromorphone 4mg , 1 tablet every six hours as needed for severe pain   - cyclobenzaprine 10mg , 1 tablet three times daily as needed for muscle spasms   We discussed:  concomitant use of 2 opioids and cyclobenzaprine increases risk of CNS depression  - Patient states she relies more on tramadol. If she notices pain is severe she still stop taking tramadol and a dose of hydromorphone (which she states is rare) and she limits use of cyclobenzaprine to 1 tablet at night.   Plan Patient endorses medications for a long time and it's the only thing that works for her back pain.  Continue current medications.   Diabetic neuropathy   Patient has failed these meds in past: none  Patient is currently controlled on the following medications:  - gabapentin 300mg , 2 capsules three times daily   Plan  Continue current medications.   Skin lesions   Patient has failed these meds in past: none  Patient is currently controlled on the following medications:  - mupirocin 2% ointment, apply three times daily  - Bactrim DS, 1 tablet twice daily - triamcinolone 0.1% cream, apply twice daily  Plan Provided patient information of referral made to derm.  Continue current medications.   Anxiety   Patient inquired on dose increase since he states having thoughts of hurting herself before, but no plan. Currently states she feels good, but would like to take care of herself before it gets worse.   Patient has failed these meds in past: none  Patient is  currently controlled on the following medications:  - sertraline 100mg , 1.5 tablets once daily   Plan Continue current medications Discussed with Dr. Sarajane Jews. Dr. Sarajane Jews prefers for patient to schedule a visit with him for further discussion. Patient to make a visit with Dr. Sarajane Jews within the next week.    Insomnia   Patient has failed these meds in past: none  Patient is currently controlled on the following medications:  - temazepam 30mg , 1 capsule at bedtime as needed for sleep  We discussed:  Discussed non-pharmacological interventions for insomnia. (Avoid napping, limit exposure to technology near bedtime, etc).  - patient notes using temazepam 1-2x per week.   Plan Continue current medications.    Aspirin therapy   Patient is currently managed on the following medications:  - ASA 81mg  once daily   Plan Continue current medications.   Vaccines   Reviewed and discussed patient's vaccination history.    Immunization History  Administered Date(s) Administered  . Influenza Whole 12/27/2004, 11/27/2008  . Influenza,inj,Quad PF,6+ Mos 10/08/2012, 10/05/2013, 10/06/2014, 01/04/2016, 10/29/2016, 12/23/2017, 11/30/2018  . Tdap 04/01/2017   Covid vaccine [place]  1st:05/20/19 2nd: 06/15/19  Plan Sending message to [facility] to add COVID vaccine information to patient's record.     Medication Management  Patient organizes medications: husbands helps her organize medications and makes she takes her meds.    Follow up Follow up appointment in 1 month.  Patient instructed to make follow up visit with Dr. Sarajane Jews within 1 week.  Can attempt to start patient on Ozempic samples at that time to see how she does and will start patient assistance forms for Ozempic.    Anson Crofts, PharmD Clinical Pharmacist [facility] [phone]

## 2019-07-11 NOTE — Patient Instructions (Addendum)
Visit Information  Goals Addressed            This Visit's Progress   . Pharmacy Care Plan       CARE PLAN ENTRY  Current Barriers:  . Chronic Disease Management support, education, and care coordination needs related to Hypertension, Hyperlipidemia, Diabetes, and Anxiety   Hypertension . Pharmacist Clinical Goal(s): o Over the next 30 days, patient will work with PharmD and providers to achieve BP goal <130/80 . Current regimen:  o diltiazem (Cardizem CD) 360mg , 1 capsule daily o HCTZ 25mg  ,1 tablet daily o metoprolol succinate 100mg , 1 tablet daily  . Interventions: o Recommend stopping diltiazem CD 240mg  and start 360mg  capsule.  . Patient self care activities - Over the next 30 days, patient will: o Check BP as instructed, document, and provide at future appointments o Ensure daily salt intake < 2300 mg/day  Hyperlipidemia/ hypertriglyceridemia . Pharmacist Clinical Goal(s): o Over the next 30 days, patient will work with PharmD and providers to achieve LDL goal < 30 . Current regimen:  o atorvastatin 10mg , 1 tablet daily o fenofibrate 160mg , 1 tablet daily  . Interventions: o Discussed lifestyle modifications (diet).  o Provided resources to obtain fruits/ vegetables.  . Patient self care activities - Over the next 30 days, patient will: o Work on incorporating more fruits and vegetables in diet.   Diabetes . Pharmacist Clinical Goal(s): o Over the next 30 days, patient will work with PharmD and providers to maintain A1c goal <7% . Current regimen:  o glipizide 10mg , 1 tablet twice daily o metformin XR 500mg  1 tablet twice daily  . Interventions: o Recommend starting Ozempic injection.  . Patient self care activities - Over the next 30 days, patient will: o Check blood sugar as directed, document, and provide at future appointments  Anxiety  . Pharmacist Clinical Goal(s) o Over the next 7 days, patient will work with PharmD and providers to address concerns  and provide further management.  . Current regimen:  o sertraline 100mg , 1.5 tablets once daily . Interventions: o Discussed with Dr. Sarajane Jews. Recommends making a visit with him to discuss further and explore options.  . Patient self care activities - Over the next 7 days, patient will: o Call office to make visit appointment with Dr. Sarajane Jews.   Medication management . Pharmacist Clinical Goal(s): o Over the next 30 days, patient will work with PharmD and providers to achieve optimal medication adherence . Current pharmacy: [facility] . Interventions o Comprehensive medication review performed. o Continue current medication management strategy . Patient self care activities - Over the next 30 days, patient will: o Take medications as prescribed o Report any questions or concerns to PharmD and/or provider(s)  Please see past updates related to this goal by clicking on the "Past Updates" button in the selected goal           Christina [name] was given information about Chronic Care Management services today including:  1. CCM service includes personalized support from designated clinical staff supervised by her physician, including individualized plan of care and coordination with other care providers 2. 24/7 contact phone numbers for assistance for urgent and routine care needs. 3. Standard insurance, coinsurance, copays and deductibles apply for chronic care management only during months in which we provide at least 20 minutes of these services. Most insurances cover these services at 100%, however patients may be responsible for any copay, coinsurance and/or deductible if applicable. This service may help you avoid the need  for more expensive face-to-face services. 4. Only one practitioner may furnish and bill the service in a calendar month. 5. The patient may stop CCM services at any time (effective at the end of the month) by phone call to the office staff.  Patient agreed to services and verbal  consent obtained.   The patient verbalized understanding of instructions provided today and agreed to receive a mailed copy of patient instruction and/or educational materials. Telephone follow up appointment with pharmacy team member scheduled for: 08/10/2019   Anson Crofts, PharmD Clinical Pharmacist [facility] [place] [phone]   Diabetes Mellitus and Nutrition, Adult When you have diabetes (diabetes mellitus), it is very important to have healthy eating habits because your blood sugar (glucose) levels are greatly affected by what you eat and drink. Eating healthy foods in the appropriate amounts, at about the same times every day, can help you:  Control your blood glucose.  Lower your risk of heart disease.  Improve your blood pressure.  Reach or maintain a healthy weight. Every person with diabetes is different, and each person has different needs for a meal plan. Your health care provider may recommend that you work with a diet and nutrition specialist (dietitian) to make a meal plan that is best for you. Your meal plan may vary depending on factors such as:  The calories you need.  The medicines you take.  Your weight.  Your blood glucose, blood pressure, and cholesterol levels.  Your activity level.  Other health conditions you have, such as heart or kidney disease. How do carbohydrates affect me? Carbohydrates, also called carbs, affect your blood glucose level more than any other type of food. Eating carbs naturally raises the amount of glucose in your blood. Carb counting is a method for keeping track of how many carbs you eat. Counting carbs is important to keep your blood glucose at a healthy level, especially if you use insulin or take certain oral diabetes medicines. It is important to know how many carbs you can safely have in each meal. This is different for every person. Your dietitian can help you calculate how many carbs you should  have at each meal and for each snack. Foods that contain carbs include:  Bread, cereal, rice, pasta, and crackers.  Potatoes and corn.  Peas, beans, and lentils.  Milk and yogurt.  Fruit and juice.  Desserts, such as cakes, cookies, ice cream, and candy. How does alcohol affect me? Alcohol can cause a sudden decrease in blood glucose (hypoglycemia), especially if you use insulin or take certain oral diabetes medicines. Hypoglycemia can be a life-threatening condition. Symptoms of hypoglycemia (sleepiness, dizziness, and confusion) are similar to symptoms of having too much alcohol. If your health care provider says that alcohol is safe for you, follow these guidelines:  Limit alcohol intake to no more than 1 drink per day for nonpregnant women and 2 drinks per day for men. One drink equals 12 oz of beer, 5 oz of wine, or 1 oz of hard liquor.  Do not drink on an empty stomach.  Keep yourself hydrated with water, diet soda, or unsweetened iced tea.  Keep in mind that regular soda, juice, and other mixers may contain a lot of sugar and must be counted as carbs. What are tips for following this plan?  Reading food labels  Start by checking the serving size on the "Nutrition Facts" label of packaged foods and drinks. The amount of calories, carbs, fats, and  other nutrients listed on the label is based on one serving of the item. Many items contain more than one serving per package.  Check the total grams (g) of carbs in one serving. You can calculate the number of servings of carbs in one serving by dividing the total carbs by 15. For example, if a food has 30 g of total carbs, it would be equal to 2 servings of carbs.  Check the number of grams (g) of saturated and trans fats in one serving. Choose foods that have low or no amount of these fats.  Check the number of milligrams (mg) of salt (sodium) in one serving. Most people should limit total sodium intake to less than 2,300 mg per  day.  Always check the nutrition information of foods labeled as "low-fat" or "nonfat". These foods may be higher in added sugar or refined carbs and should be avoided.  Talk to your dietitian to identify your daily goals for nutrients listed on the label. Shopping  Avoid buying canned, premade, or processed foods. These foods tend to be high in fat, sodium, and added sugar.  Shop around the outside edge of the grocery store. This includes fresh fruits and vegetables, bulk grains, fresh meats, and fresh dairy. Cooking  Use low-heat cooking methods, such as baking, instead of high-heat cooking methods like deep frying.  Cook using healthy oils, such as olive, canola, or sunflower oil.  Avoid cooking with butter, cream, or high-fat meats. Meal planning  Eat meals and snacks regularly, preferably at the same times every day. Avoid going long periods of time without eating.  Eat foods high in fiber, such as fresh fruits, vegetables, beans, and whole grains. Talk to your dietitian about how many servings of carbs you can eat at each meal.  Eat 4-6 ounces (oz) of lean protein each day, such as lean meat, chicken, fish, eggs, or tofu. One oz of lean protein is equal to: ? 1 oz of meat, chicken, or fish. ? 1 egg. ?  cup of tofu.  Eat some foods each day that contain healthy fats, such as avocado, nuts, seeds, and fish. Lifestyle  Check your blood glucose regularly.  Exercise regularly as told by your health care provider. This may include: ? 150 minutes of moderate-intensity or vigorous-intensity exercise each week. This could be brisk walking, biking, or water aerobics. ? Stretching and doing strength exercises, such as yoga or weightlifting, at least 2 times a week.  Take medicines as told by your health care provider.  Do not use any products that contain nicotine or tobacco, such as cigarettes and e-cigarettes. If you need help quitting, ask your health care provider.  Work with  a Social worker or diabetes educator to identify strategies to manage stress and any emotional and social challenges. Questions to ask a health care provider  Do I need to meet with a diabetes educator?  Do I need to meet with a dietitian?  What number can I call if I have questions?  When are the best times to check my blood glucose? Where to find more information:  American Diabetes Association: diabetes.org  Academy of Nutrition and Dietetics: www.eatright.[employer] of Diabetes and Digestive and Kidney Diseases (NIH): [employer] Summary  A healthy meal plan will help you control your blood glucose and maintain a healthy lifestyle.  Working with a diet and nutrition specialist (dietitian) can help you make a meal plan that is best for you.  Keep in mind that  carbohydrates (carbs) and alcohol have immediate effects on your blood glucose levels. It is important to count carbs and to use alcohol carefully. This information is not intended to replace advice given to you by your health care provider. Make sure you discuss any questions you have with your health care provider. Document Revised: 12/26/2016 Document Reviewed: 02/18/2016 Elsevier Patient Education  2020 [employer].

## 2019-07-13 ENCOUNTER — Ambulatory Visit (INDEPENDENT_AMBULATORY_CARE_PROVIDER_SITE_OTHER): Payer: PPO | Admitting: Family Medicine

## 2019-07-13 ENCOUNTER — Other Ambulatory Visit: Payer: Self-pay

## 2019-07-13 ENCOUNTER — Encounter: Payer: Self-pay | Admitting: Family Medicine

## 2019-07-13 VITALS — BP 150/70 | HR 82 | Temp 97.5°F

## 2019-07-13 DIAGNOSIS — E1165 Type 2 diabetes mellitus with hyperglycemia: Secondary | ICD-10-CM | POA: Diagnosis not present

## 2019-07-13 DIAGNOSIS — I10 Essential (primary) hypertension: Secondary | ICD-10-CM

## 2019-07-13 DIAGNOSIS — F418 Other specified anxiety disorders: Secondary | ICD-10-CM | POA: Diagnosis not present

## 2019-07-13 MED ORDER — OZEMPIC (0.25 OR 0.5 MG/DOSE) 2 MG/1.5ML ~~LOC~~ SOPN: 0.5000 mg | PEN_INJECTOR | SUBCUTANEOUS | 0 refills | Status: DC

## 2019-07-13 MED ORDER — VENLAFAXINE HCL ER 150 MG PO CP24
150.0000 mg | ORAL_CAPSULE | Freq: Every day | ORAL | 2 refills | Status: DC
Start: 2019-07-13 — End: 2019-08-08

## 2019-07-13 NOTE — Progress Notes (Signed)
   Subjective:    Patient ID: Christina [name], female    DOB: [birth year], 54 y.o.   MRN: [id number]  HPI Here for several issues. First she asks about samples of a new diabetes medication. She has been working with our PharmD to come with better ways to control her diabetes. Currently she is taking Metformin and Glipizide, but this is not working well. Januvia was too expensive on her insurance plan. Also we need to follow up on her BP. We recently increased her Diltiazem to 360 mg daily  And this has worked well. She also described dealing with a lot of depression lately, and she is not sure why. She admits to having some suicidal ideation, but she states clearly that she has not intention of harming herself. She feels sad and hopeless, and cries frequently. She has been on Zoloft for several years.    Review of Systems  Constitutional: Negative.   Respiratory: Negative.   Cardiovascular: Negative.   Psychiatric/Behavioral: Positive for decreased concentration, dysphoric mood, sleep disturbance and suicidal ideas. Negative for agitation, behavioral problems, confusion, hallucinations and self-injury. The patient is nervous/anxious.        Objective:   Physical Exam Constitutional:      Appearance: Normal appearance.     Comments: In her wheelchair   Cardiovascular:     Rate and Rhythm: Normal rate and regular rhythm.     Pulses: Normal pulses.     Heart sounds: Normal heart sounds.  Pulmonary:     Effort: Pulmonary effort is normal.     Breath sounds: Normal breath sounds.  Neurological:     Mental Status: She is alert.           Assessment & Plan:  Her HTN is now well controlled. For the diabetes we gave her samples to try Ozempic 0.5 mg injections once weekly and she will stop the Glipizide. Our PharmD will be working on enrolling Kaileia in a program through the [facility]. For the depression she will stop Zoloft and will start on Effexor XR 150 mg daily. Recheck in 3-4  weeks.  Alysia Penna, MD

## 2019-07-17 ENCOUNTER — Other Ambulatory Visit: Payer: Self-pay | Admitting: Family Medicine

## 2019-07-20 ENCOUNTER — Other Ambulatory Visit: Payer: Self-pay | Admitting: Family Medicine

## 2019-08-03 ENCOUNTER — Other Ambulatory Visit: Payer: Self-pay

## 2019-08-03 ENCOUNTER — Ambulatory Visit (INDEPENDENT_AMBULATORY_CARE_PROVIDER_SITE_OTHER): Payer: PPO | Admitting: Family Medicine

## 2019-08-03 ENCOUNTER — Encounter: Payer: Self-pay | Admitting: Family Medicine

## 2019-08-03 VITALS — BP 140/80 | HR 81 | Temp 97.4°F

## 2019-08-03 DIAGNOSIS — F418 Other specified anxiety disorders: Secondary | ICD-10-CM | POA: Diagnosis not present

## 2019-08-03 DIAGNOSIS — I10 Essential (primary) hypertension: Secondary | ICD-10-CM

## 2019-08-03 DIAGNOSIS — E1165 Type 2 diabetes mellitus with hyperglycemia: Secondary | ICD-10-CM

## 2019-08-03 NOTE — Progress Notes (Signed)
   Subjective:    Patient ID: Christina [name], female    DOB: 08/25/1965, 54 y.o.   MRN: [id number]  HPI Here to follow up on several issues. Her BP continues to be well controlled since increased the dose of her Diltiazem. Several weeks ago we gave her samples to try Ozempic injections weekly and we switched her from Zoloft to Effexor XR for the depression and anxiety. She has been pleased with the Effexor and her depression has abated a bit. Her glucoses have improved on Ozempic 0.5 mg weekly. Her am fasting readings have ranged from 120 to 150.    Review of Systems  Constitutional: Negative.   Respiratory: Negative.   Cardiovascular: Negative.   Neurological: Negative.   Psychiatric/Behavioral: Negative.        Objective:   Physical Exam Constitutional:      Comments: In her wheelchair   Cardiovascular:     Rate and Rhythm: Normal rate and regular rhythm.     Pulses: Normal pulses.     Heart sounds: Normal heart sounds.  Pulmonary:     Effort: Pulmonary effort is normal.     Breath sounds: Normal breath sounds.  Neurological:     Mental Status: She is alert and oriented to person, place, and time. Mental status is at baseline.  Psychiatric:        Mood and Affect: Mood normal.           Assessment & Plan:  Her HTN is well controlled. Her diabetes has improved, but we agreed to increase the dose of Ozempic to 1 mg weekly. Her depression has improved and we agreed to keep the Effexor XR at 150 mg daily. Recheck in one month.  Alysia Penna, MD

## 2019-08-04 ENCOUNTER — Other Ambulatory Visit: Payer: Self-pay | Admitting: Family Medicine

## 2019-08-04 NOTE — Telephone Encounter (Signed)
Ok for 90 days  

## 2019-08-10 ENCOUNTER — Ambulatory Visit: Payer: PPO

## 2019-08-10 ENCOUNTER — Other Ambulatory Visit: Payer: Self-pay

## 2019-08-10 DIAGNOSIS — F418 Other specified anxiety disorders: Secondary | ICD-10-CM

## 2019-08-10 DIAGNOSIS — I10 Essential (primary) hypertension: Secondary | ICD-10-CM

## 2019-08-10 DIAGNOSIS — E785 Hyperlipidemia, unspecified: Secondary | ICD-10-CM

## 2019-08-10 DIAGNOSIS — E1165 Type 2 diabetes mellitus with hyperglycemia: Secondary | ICD-10-CM

## 2019-08-10 DIAGNOSIS — E781 Pure hyperglyceridemia: Secondary | ICD-10-CM

## 2019-08-10 NOTE — Chronic Care Management (AMB) (Signed)
Chronic Care Management Pharmacy  Name: Christina [name]  MRN: [id number] DOB: [birth year]  Initial Questions: 1. Have you seen any other providers since your last visit? Yes  2. Any changes in your medicines or health? Yes - started Ozempic, sertraline stopped and switched to venlafaxine   Chief Complaint/ HPI  Christina [name],  54 y.o. , female presents for their Follow-Up CCM visit with the clinical pharmacist via telephone due to COVID-19 Pandemic.  PCP : Laurey Morale, MD  Patient reported feeling better overall since last CCM visit. She reports improvements in BGs with being on Ozempic for ~1 month. Patient impressed with results and has also noted weight loss. Patient hopes to continue on this and wants to proceed with Ozempic patient assistance program. Patient also reports not drinking soda as she started to drink carbonated water instead.   Their chronic conditions include: HTN, Asthma, GERD, DM, Hypothyroidism, Arthritis, skin lesions, vitamin B12 deficiency, anxiety, insomnia  Office Visits: 08/03/2019- Alysia Penna, MD- Patient presented for office visit for BP and depression/ anxiety. BP and BGs improved. Ozempic increased to 1mg  weekly. Patient to continue Effexor XR 150mg  daily. Patient to follow up in 1 month.   07/13/2019- Alysia Penna, MD- Patient presented for office visit for DM and depression/ anxiety. Patient given samples of Ozempic 0.5mg  one weekly and glipizide to be stopped. Zoloft stopped and switched to Effexor XR 150mg  daily. Recheck in 3 to 4 weeks.   05/02/2019- Alysia Penna, MD- Patient presented for office visit for well exam. Patient to obtain fasting labs. Patient referred to dermatology for likely basal cell cancer on nose. Patient referred to pulmonary for sleep apnea work up. For reported elevated home BP readings, diltiazem CD increased to 360mg  daily.   11/30/2018- Patient presented for office visit with Dr. Alysia Penna, MD for follow up. Patient's HTN and  asthma are stable. Dose of tramadol increased to every 6 hours as needed for back pain. Patient instructed to decrease carb intake for her diabetes.   03/17/2018- Patient presented for office visit with Dr. Alysia Penna, MD for DM follow up. Patient was prescribed Januvia, previous A1c in Nov: 8.0%; however, patient was unable to afford it. Full thyroid panel ordered since patient complained for hair loss. She was given Keflex for folliculitis on scalp.   Consult Visit: 02/18/2018- Orthopedics- Patient presented to Dwana Melena, PA for s/p achilles tendon rupture. Patient to cover wound with mupirocin. Patient to use cam walker as opposed to barefoot. Follow up in 3 weeks for recheck.   Medications: Outpatient Encounter Medications as of 08/10/2019  Medication Sig  . aspirin 81 MG EC tablet Take 81 mg by mouth daily.    [name] atorvastatin (LIPITOR) 10 MG tablet TAKE 1 TABLET BY MOUTH EVERY DAY  . cephALEXin (KEFLEX) 500 MG capsule TAKE 1 CAPSULE BY MOUTH THREE TIMES A DAY  . cholecalciferol (VITAMIN D) 1000 UNITS tablet Take 1,000 Units by mouth daily.  . Cinnamon 500 MG TABS Take 2 tablets by mouth daily.  . clobetasol (TEMOVATE) 0.05 % external solution Apply 1 application topically 2 (two) times daily.  . [facility] ULTRA THIN LANCETS MISC 1 strip by Does not apply route as directed.  . Cyanocobalamin (VITAMIN B 12 PO) Take 1,000 Units by mouth daily.  . cyclobenzaprine (FLEXERIL) 10 MG tablet TAKE 1 TABLET BY MOUTH THREE TIMES A DAY AS NEEDED FOR MUSCLE SPASMS  . desonide (DESOWEN) 0.05 % cream Apply topically 2 (two) times daily.    [name]  diclofenac (VOLTAREN) 75 MG EC tablet TAKE 1 TABLET BY MOUTH TWICE A DAY  . diltiazem (CARDIZEM CD) 240 MG 24 hr capsule TAKE 1 CAPSULE BY MOUTH EVERY DAY (Patient not taking: Reported on 08/10/2019)  . diltiazem (CARDIZEM CD) 360 MG 24 hr capsule Take 1 capsule (360 mg total) by mouth daily.  . diphenhydrAMINE (BENADRYL) 25 mg capsule Take 25 mg by mouth as needed.    .  fenofibrate 160 MG tablet TAKE 1 TABLET BY MOUTH DAILY  . gabapentin (NEURONTIN) 300 MG capsule TAKE 2 CAPSULES (600 MG TOTAL) BY MOUTH 3 (THREE) TIMES DAILY.  [name] glucose blood (TRUETEST TEST) test strip Use as instructed  . hydrochlorothiazide (HYDRODIURIL) 25 MG tablet TAKE 1 TABLET BY MOUTH EVERY DAY (Patient not taking: Reported on 08/10/2019)  . HYDROmorphone (DILAUDID) 4 MG tablet Take 1 tablet (4 mg total) by mouth every 6 (six) hours as needed for severe pain.  [name] levothyroxine (SYNTHROID) 50 MCG tablet TAKE 1 TABLET (50 MCG TOTAL) BY MOUTH DAILY.  . metFORMIN (GLUCOPHAGE-XR) 500 MG 24 hr tablet TAKE 1 TABLET BY MOUTH TWICE A DAY WITH MEAL  . metoprolol succinate (TOPROL-XL) 100 MG 24 hr tablet TAKE 1 TABLET BY MOUTH DAILY. TAKE WITH OR IMMEDIATELY FOLLOWING A MEAL (Patient not taking: Reported on 08/10/2019)  . mupirocin ointment (BACTROBAN) 2 % Apply topically 3 (three) times daily.  . potassium chloride (KLOR-CON M10) 10 MEQ tablet TAKE 1 TABLET BY MOUTH EVERY DAY  . promethazine (PHENERGAN) 25 MG tablet Take 1 tablet (25 mg total) by mouth every 6 (six) hours as needed for nausea.  . Semaglutide,0.25 or 0.5MG /DOS, (OZEMPIC, 0.25 OR 0.5 MG/DOSE,) 2 MG/1.5ML SOPN Inject 0.375 mLs (0.5 mg total) into the skin once a week.  . sitaGLIPtin (JANUVIA) 100 MG tablet Take 1 tablet (100 mg total) by mouth daily. (Patient not taking: Reported on 08/10/2019)  . sulfamethoxazole-trimethoprim (BACTRIM DS) 800-160 MG tablet Take 1 tablet by mouth 2 (two) times daily.  . temazepam (RESTORIL) 30 MG capsule Take 1 capsule (30 mg total) by mouth at bedtime as needed for sleep.  . traMADol (ULTRAM) 50 MG tablet TAKE 2 TABLETS BY MOUTH 3 TIMES DAILY AS NEEDED FOR MODERATE PAIN  . triamcinolone cream (KENALOG) 0.1 % Apply 1 application topically 2 (two) times daily.  [name] venlafaxine XR (EFFEXOR-XR) 150 MG 24 hr capsule TAKE 1 CAPSULE (150 MG TOTAL) BY MOUTH DAILY WITH BREAKFAST.  [name] vitamin B-12 (CYANOCOBALAMIN) 1000  MCG tablet Take 1,000 mcg by mouth daily. 2500 mcg once daily  . vitamin E (VITAMIN E) 180 MG (400 UNITS) capsule Take 400 Units by mouth daily.  . [DISCONTINUED] omeprazole (PRILOSEC) 20 MG capsule Take 1 capsule (20 mg total) by mouth 2 (two) times daily.  . [DISCONTINUED] potassium chloride (KLOR-CON 10) 10 MEQ CR tablet Take 1 tablet (10 mEq total) by mouth daily.   No facility-administered encounter medications on file as of 08/10/2019.    Current Diagnosis/Assessment:  Goals Addressed            This Visit's Progress   . Pharmacy Care Plan       CARE PLAN ENTRY  Current Barriers:  . Chronic Disease Management support, education, and care coordination needs related to Hypertension, Hyperlipidemia, Diabetes, Depression, and Anxiety   Hypertension . Pharmacist Clinical Goal(s): o Over the next 30 days, patient will work with PharmD and providers to achieve BP goal <130/80 . Current regimen:  o diltiazem (Cardizem CD) 360mg , 1 capsule daily .  Interventions: o Recommend addition of blood pressure medication (Chlorthalidone 25mg , 1 tablet once daily) . Patient self care activities - Over the next 30 days, patient will: o Check BP as instructed, document, and provide at future appointments o Ensure daily salt intake < 2300 mg/day  Hyperlipidemia/ hypertriglyceridemia . Pharmacist Clinical Goal(s): o Over the next 30 days, patient will work with PharmD and providers to achieve LDL goal < 30 . Current regimen:  o atorvastatin 10mg , 1 tablet daily o fenofibrate 160mg , 1 tablet daily  . Interventions: o Discussed lifestyle modifications (diet).  o Provided resources to obtain fruits/ vegetables.  . Patient self care activities - Over the next 30 days, patient will: o Work on incorporating more fruits and vegetables in diet.   Diabetes . Pharmacist Clinical Goal(s): o Over the next 30 days, patient will work with PharmD and providers to maintain A1c goal <7% . Current  regimen:  o metformin XR 500mg  1 tablet twice daily  o semaglutide (Ozempic) 0.5mg  once a week . Interventions: o Mailed patient assistance forms for Ozempic.  [name] Patient self care activities - Over the next 30 days, patient will: o Check blood sugar as directed, document, and provide at future appointments o Complete patient assistance forms and either mail back or bring back to [place] office. Attention: Anne Ng (clinical pharmacist)   Anxiety/ depression  . Pharmacist Clinical Goal(s) o Over the next 30days, patient will work with PharmD and providers to address concerns and provide further management.  . Current regimen:   venlafaxine XR 150mg , 1 capsule one daily with breakfast  . Patient self care activities - Over the next 30 days, patient will: o Continue current medications and follow up visits.   Medication management . Pharmacist Clinical Goal(s): o Over the next 30 days, patient will work with PharmD and providers to achieve optimal medication adherence . [facility] . Interventions o Comprehensive medication review performed. o Continue current medication management strategy . Patient self care activities - Over the next 30 days, patient will: o Take medications as prescribed o Report any questions or concerns to PharmD and/or provider(s)  Please see past updates related to this goal by clicking on the "Past Updates" button in the selected goal          SDOH Interventions     Most Recent Value  SDOH Interventions  Financial Strain Interventions Other (Comment)  [Patient does not qualify for Extra Help. Mailed Ozempic patient assistance forms.]       Diabetes   Recent Relevant Labs: Lab Results  Component Value Date/Time   HGBA1C 9.3 (H) 05/02/2019 02:51 PM   HGBA1C 7.9 (A) 11/30/2018 02:11 PM   HGBA1C 7.5 (H) 03/17/2018 04:39 PM   MICROALBUR 2.5 (H) 02/21/2014 02:22 PM   MICROALBUR 1.7 10/05/2013 10:42 AM    Checking BGs: about 2-3 times  per week.   Recent BGs readings: 125 (improved)  Prior blood sugar readings: 150 273 mg/dl (Patient reported having pizza prior night).   Patient has failed these meds in past: glipizide  Patient is currently uncontrolled (based on A1c- but improved FBGs) on the following medications:   semaglutide (Ozempic) 0.5mg  once a week  metformin XR 500mg  1 tablet twice daily    Last diabetic Eye exam: No results found for: HMDIABEYEEXA  - Patient notes last eye exam was 4 years ago. Counseled importance of diabetic eye exam every 1 to 2 years.  Last diabetic Foot exam: No results found for: HMDIABFOOTEX  -  Discussed foot exams at least once yearly. Patient endorses daily feet inspections at home.   We discussed:    Diet: patient endorses eating a lot of potatoes, oatmeal, Cheerios. She is aware of not eating correct food choices. Discussed plate method and limiting carbs/ starches per meal.   Exercise: patient notes she is unable to exercise since she suffers from chronic pain.    Plan  Continue current medications.  Mailing patient- [employer] patient assistance application for Ozempic.   Hyperlipidemia   Lipid Panel     Component Value Date/Time   CHOL 191 05/02/2019 1451   TRIG 268.0 (H) 05/02/2019 1451   TRIG 160 (H) 12/30/2005 1425   HDL 48.30 05/02/2019 1451   CHOLHDL 4 05/02/2019 1451   VLDL 53.6 (H) 05/02/2019 1451   LDLCALC 76 04/01/2017 1544   LDLDIRECT 107.0 05/02/2019 1451    The 10-year ASCVD risk score Mikey Bussing DC Jr., et al., 2013) is: 6%  Patient has failed these meds in past: none   Patient is currently controlled on the following medications:   atorvastatin 10mg , 1 tablet daily  fenofibrate 160mg , 1 tablet daily   We discussed:  diet and exercise extensively - How to reduce cholesterol through diet/weight management and physical activity.    - We discussed how a diet high in plant sterols (fruits/vegetables/nuts/whole grains/legumes) may reduce your  cholesterol.   - Encouraged increasing fiber to a daily intake of 10-25g/day   Plan Continue current medications.   Hypertension  BP today is: >140/102mmHg   Office blood pressures are  BP Readings from Last 3 Encounters:  08/03/19 140/80  07/13/19 (!) 150/70  05/02/19 140/80  Patient was these meds in the past: - lisinopril (facial swelling) - metoprolol succinate 100mg , HCTZ 25mg  (patient reports these were stopped when she started diltiazem) - no notes found in patient chart stating this.   Patient checks BP at home daily  Patient home BP readings are ranging: 169/92, 175/103, 159/ 104-107   Patient is uncontrolled on:   diltiazem (Cardizem CD) 360mg , 1 capsule daily    We discussed diet and exercise extensively  - states pain is high. Has headache a lot.  - patient reports not a salt fan  Plan Consulted with Dr. Sarajane Jews. Patient to restart metoprolol succinate 100mg  once daily. Continue current medications.   Hypothyroidism   TSH  Date Value Ref Range Status  05/02/2019 1.91 0.35 - 4.50 uIU/mL Final    No components found for: T4  Patient has failed these meds in past: none  Patient is currently controlled on the following medications:   levothyroxine 17mcg, 1 tablet daily   Plan Continue current medications  Asthma  Patient notes she was diagnosed with asthma over 20 years ago. Working conditions made breathing worse. She denies having any breathing problems at the moment.   Eosinophil count:   Lab Results  Component Value Date/Time   EOSPCT 1.4 05/02/2019 02:51 PM  %                               Eos (Absolute):  Lab Results  Component Value Date/Time   EOSABS 0.1 05/02/2019 02:51 PM   Tobacco Status:  Social History   Tobacco Use  Smoking Status Former Smoker  Smokeless Tobacco Never Used  - Patient endorse stopped smoking 12-14 years ago.   Patient has failed these meds in past: patient does not remember names of inhalers ("made her  heart do  weird things and sent her into panic").   Patient is currently controlled on the following medications:  - currently not on any inhalers.   Using maintenance inhaler regularly? No Frequency of rescue inhaler use:  never  We discussed:  discussed having rescue inhalers on hand (selective beta-2 agonist; i.e. Xopenex)  Plan Patient wants to continue as is.   GERD  Patient has failed these meds in past: none   Patient is currently controlled on the following medications:   omeprazole 20mg , 1 capsule twice daily    We discussed:  Discussed non-pharmacological interventions for acid reflux. Take measures to prevent acid reflux, such as avoiding spicy foods, avoiding caffeine, avoid laying down a few hours after eating, and raising the head of the bed.  Plan Continue current medications.    Vitamin B12 deficiency   Patient has failed these meds in past: none  Patient is currently managed on the following medications:   cyanocobalamin 1000 units daily   Plan Continue current medications. Recommend vitamin B12 level.   Arthritis/ muscle spasms    Patient has failed these meds in past: Opana (oxymorphone)   Patient is currently controlled on the following medications:  diclofenac 75mg , 1 tablet twice daily   tramadol 50mg , 2 tablets three times daily as needed for moderate pain   hydromorphone 4mg , 1 tablet every six hours as needed for severe pain    cyclobenzaprine 10mg , 1 tablet three times daily as needed for muscle spasms   We discussed:  concomitant use of 2 opioids and cyclobenzaprine increases risk of CNS depression  - Patient states she relies more on tramadol. If she notices pain is severe she still stop taking tramadol and will take a dose of hydromorphone (which she states is rare) and she limits use of cyclobenzaprine to 1 tablet at night.   Plan Patient endorses medications for a long time and it's the only thing that works for her back pain.  Continue  current medications.   Diabetic neuropathy   Patient has failed these meds in past: none  Patient is currently controlled on the following medications:   gabapentin 300mg , 2 capsules three times daily   Plan  Continue current medications.   Skin lesions     Patient has failed these meds in past: none  Patient is currently controlled on the following medications:   mupirocin 2% ointment, apply three times daily   Bactrim DS, 1 tablet twice daily  triamcinolone 0.1% cream, apply twice daily  Plan Provided patient information of referral made to derm.  Continue current medications.   Anxiety / depression   Patient reports having better in terms of depression/ mood since switching to venlafaxine a few weeks ago. She   Patient has failed these meds in past: sertraline Patient is currently controlled on the following medications:   venlafaxine XR 150mg , 1 capsule one daily with breakfast   Plan Continue current medications  Reassess in 1 month.     Insomnia   Patient has failed these meds in past: none  Patient is currently controlled on the following medications:   temazepam 30mg , 1 capsule at bedtime as needed for sleep  We discussed:  Discussed non-pharmacological interventions for insomnia. (Avoid napping, limit exposure to technology near bedtime, etc).  - patient notes using temazepam 1-2x per week.   Plan Continue current medications.    Aspirin therapy   Patient is currently managed on the following medications:   ASA 81mg  once daily  Plan Continue current medications.   Vaccines   Reviewed and discussed patient's vaccination history.    Immunization History  Administered Date(s) Administered  . Influenza Whole 12/27/2004, 11/27/2008  . Influenza,inj,Quad PF,6+ Mos 10/08/2012, 10/05/2013, 10/06/2014, 01/04/2016, 10/29/2016, 12/23/2017, 11/30/2018  . PFIZER SARS-COV-2 Vaccination 05/20/2019, 06/15/2019  . Tdap 04/01/2017   Plan Reassess at  follow up for shingles vaccine.   Medication Management   Patient organizes medications: husbands helps her organize medications and makes she takes her meds.  Completed patient assistance documentation- pending income information and signature from provider and patient. Mailed to patient and patient to return for completion.    Verbal consent obtained for [facility] enhanced pharmacy services (medication synchronization, adherence packaging, delivery coordination). A medication sync plan was created to allow patient to get all medications delivered once every 30 to 90 days per patient preference. Patient understands they have freedom to choose pharmacy and clinical pharmacist will coordinate care between all prescribers and [facility].  Patient requests deliveries for morning route and 90 day supply.  Patient requested list of medication costs in order to coordinate deliveries to be able to separate medication costs. Patient defers med sync since she states it would not be cost-effective for her.    Follow up Follow up appointment in 1 month for DM, depression, and BP reassessment.  Mailing patient assistance form for Ozempic and list of medication cost. Will follow up accordingly to onboard patient to pharmacy.   Verbal consent obtained for [facility] enhanced pharmacy services (medication synchronization, adherence packaging, delivery coordination). A medication sync plan was created to allow patient to get all medications delivered once every 30 to 90 days per patient preference. Patient understands they have freedom to choose pharmacy and clinical pharmacist will coordinate care between all prescribers and [facility].    Anson Crofts, PharmD Clinical Pharmacist [facility] [place] [phone]

## 2019-08-11 ENCOUNTER — Other Ambulatory Visit: Payer: Self-pay | Admitting: Family Medicine

## 2019-08-18 DIAGNOSIS — E785 Hyperlipidemia, unspecified: Secondary | ICD-10-CM | POA: Insufficient documentation

## 2019-08-18 NOTE — Patient Instructions (Addendum)
Visit Information  It is always a pleasure touching base with you! Please give me a call back: [phone] when you receive the medication list costs so we can review on a plan for medication delivery.   Goals Addressed            This Visit's Progress   . Pharmacy Care Plan       CARE PLAN ENTRY  Current Barriers:  . Chronic Disease Management support, education, and care coordination needs related to Hypertension, Hyperlipidemia, Diabetes, Depression, and Anxiety   Hypertension . Pharmacist Clinical Goal(s): o Over the next 30 days, patient will work with PharmD and providers to achieve BP goal <130/80 . Current regimen:  o diltiazem (Cardizem CD) 360mg , 1 capsule daily . Interventions: o Recommend addition of blood pressure medication (Chlorthalidone 25mg , 1 tablet once daily) . Patient self care activities - Over the next 30 days, patient will: o Check BP as instructed, document, and provide at future appointments o Ensure daily salt intake < 2300 mg/day  Hyperlipidemia/ hypertriglyceridemia . Pharmacist Clinical Goal(s): o Over the next 30 days, patient will work with PharmD and providers to achieve LDL goal < 30 . Current regimen:  o atorvastatin 10mg , 1 tablet daily o fenofibrate 160mg , 1 tablet daily  . Interventions: o Discussed lifestyle modifications (diet).  o Provided resources to obtain fruits/ vegetables.  . Patient self care activities - Over the next 30 days, patient will: o Work on incorporating more fruits and vegetables in diet.   Diabetes . Pharmacist Clinical Goal(s): o Over the next 30 days, patient will work with PharmD and providers to maintain A1c goal <7% . Current regimen:  o metformin XR 500mg  1 tablet twice daily  o semaglutide (Ozempic) 0.5mg  once a week . Interventions: o Mailed patient assistance forms for Ozempic.  [name] Patient self care activities - Over the next 30 days, patient will: o Check blood sugar as directed, document, and  provide at future appointments o Complete patient assistance forms and either mail back or bring back to [place] office. Attention: Anne Ng (clinical pharmacist)   Anxiety/ depression  . Pharmacist Clinical Goal(s) o Over the next 30days, patient will work with PharmD and providers to address concerns and provide further management.  . Current regimen:   venlafaxine XR 150mg , 1 capsule one daily with breakfast  . Patient self care activities - Over the next 30 days, patient will: o Continue current medications and follow up visits.   Medication management . Pharmacist Clinical Goal(s): o Over the next 30 days, patient will work with PharmD and providers to achieve optimal medication adherence . [facility] . Interventions o Comprehensive medication review performed. o Continue current medication management strategy . Patient self care activities - Over the next 30 days, patient will: o Take medications as prescribed o Report any questions or concerns to PharmD and/or provider(s)  Please see past updates related to this goal by clicking on the "Past Updates" button in the selected goal           Verbal consent obtained for [facility] enhanced pharmacy services (medication synchronization, adherence packaging, delivery coordination). A medication sync plan will be created to allow patient to get all medications delivered once every 30 to 90 days per patient preference. Patient understands they have freedom to choose pharmacy and clinical pharmacist will coordinate care between all prescribers and [facility].    The patient verbalized understanding of instructions provided today and agreed to receive a mailed copy  of patient instruction and/or educational materials.  Telephone follow up appointment with pharmacy team member scheduled for: 08/31/2019  Anson Crofts, PharmD Clinical Pharmacist [facility] [place] [phone]

## 2019-08-19 ENCOUNTER — Telehealth: Payer: Self-pay

## 2019-08-19 ENCOUNTER — Other Ambulatory Visit: Payer: Self-pay | Admitting: Family Medicine

## 2019-08-19 NOTE — Telephone Encounter (Signed)
Hi Dr. Sarajane Jews,   As discussed can you send in prescription for [facility]oprolol succinate 100mg , [facility]?   Thanks!

## 2019-08-22 MED ORDER — METOPROLOL SUCCINATE ER 100 MG PO TB24: ORAL_TABLET | ORAL | 3 refills | Status: DC

## 2019-08-29 ENCOUNTER — Other Ambulatory Visit: Payer: Self-pay

## 2019-08-29 ENCOUNTER — Ambulatory Visit: Payer: PPO

## 2019-08-29 ENCOUNTER — Other Ambulatory Visit: Payer: Self-pay | Admitting: Internal Medicine

## 2019-08-29 DIAGNOSIS — I10 Essential (primary) hypertension: Secondary | ICD-10-CM

## 2019-08-29 DIAGNOSIS — E1165 Type 2 diabetes mellitus with hyperglycemia: Secondary | ICD-10-CM

## 2019-08-29 DIAGNOSIS — F418 Other specified anxiety disorders: Secondary | ICD-10-CM

## 2019-08-29 DIAGNOSIS — E785 Hyperlipidemia, unspecified: Secondary | ICD-10-CM

## 2019-08-29 DIAGNOSIS — F411 Generalized anxiety disorder: Secondary | ICD-10-CM

## 2019-08-30 NOTE — Patient Instructions (Signed)
Visit Information  Goals Addressed            This Visit's Progress   . Pharmacy Care Plan       CARE PLAN ENTRY  Current Barriers:  . Chronic Disease Management support, education, and care coordination needs related to Hypertension, Hyperlipidemia, Diabetes, Depression, and Anxiety   Hypertension . Pharmacist Clinical Goal(s): o Over the next 30 days, patient will work with PharmD and providers to achieve BP goal <130/80 . Current regimen:  o diltiazem (Cardizem CD) 360mg , 1 capsule daily o metoprolol succinate 100mg  once daily (RESTART) . Interventions: o After consulting with Dr. Sarajane Jews, restart metoprolol succinate 100mg , once daily . Patient self care activities - Over the next 30 days, patient will: o Check BP as instructed, document, and provide at future appointments o Ensure daily salt intake < 2300 mg/day  Hyperlipidemia/ hypertriglyceridemia . Pharmacist Clinical Goal(s): o Over the next 30 days, patient will work with PharmD and providers to achieve LDL goal < 70 . Current regimen:  o atorvastatin 10mg , 1 tablet daily o fenofibrate 160mg , 1 tablet daily  . Interventions: o Discussed lifestyle modifications (diet).  o Provided resources to obtain fruits/ vegetables.  . Patient self care activities - Over the next 30 days, patient will: o Work on incorporating more fruits and vegetables in diet.   Diabetes . Pharmacist Clinical Goal(s): o Over the next 30 days, patient will work with PharmD and providers to maintain A1c goal <7% . Current regimen:  o metformin XR 500mg  1 tablet twice daily  o semaglutide (Ozempic) 0.5mg  once a week . Interventions: o Reviewed instructions to complete [employer] patient assistance forms for Ozempic and instructed to return to [place] office.  . Patient self care activities - Over the next 30 days, patient will: o Check blood sugar as directed, document, and provide at future appointments o Complete patient assistance forms  and either mail back or bring back to [place] office. Attention: Anne Ng (clinical pharmacist)   Anxiety/ depression  . Pharmacist Clinical Goal(s) o Over the next 30 days, patient will work with PharmD and providers to address concerns and provide further management.  . Current regimen:   venlafaxine XR 150mg , 1 capsule one daily with breakfast  . Patient self care activities - Over the next 30 days, patient will: o Continue current medications and follow up visits.   Medication management . Pharmacist Clinical Goal(s): o Over the next 30 days, patient will work with PharmD and providers to achieve optimal medication adherence . [facility] . Interventions o Comprehensive medication review performed. o Utilize [facility] for medication synchronization, packaging and delivery . Patient self care activities - Over the next 30 days, patient will: o Take medications as prescribed o Report any questions or concerns to PharmD and/or provider(s)  Please see past updates related to this goal by clicking on the "Past Updates" button in the selected goal           Patient verbalizes understanding of instructions provided today.   Telephone follow up appointment with pharmacy team member scheduled for: 09/28/2019  Anson Crofts, PharmD Clinical Pharmacist [facility] [place] [phone]

## 2019-08-30 NOTE — Chronic Care Management (AMB) (Signed)
Chronic Care Management Pharmacy  Name: Christina [name]  MRN: [id number] DOB: 1965/10/31  Initial Questions: 1. Have you seen any other providers since your last visit? Yes  2. Any changes in your medicines or health? Yes - started Ozempic, sertraline stopped and switched to venlafaxine   Chief Complaint/ HPI  Christina [name],  54 y.o. , female presents for their Follow-Up CCM visit with the clinical pharmacist via telephone due to COVID-19 Pandemic.  PCP : Laurey Morale, MD  08/30/2019 Patient reported doing better overall. She reports blood sugars have greatly improved. Recent FBS: 127 and reports losing 18 pounds. She also reports mood improving as well with recent changes of antidepressants medications. She notes being at peace with several things including her kids. She does report worrying about her daughter who she states has had 3 strokes and has difficulty with vision and son who is a Academic librarian. She reports having difficulty staying asleep. She states she sleeps at 3 to 4am (time her husband comes back from work) and she stays up watching TV or crocheting. She also reports taking naps during the day. She states she refuses to take medications routinely as he reports her mother had addiction problems and is worried she will get addicted to medications as well.   She also reports being concerned about blood pressure and it is still elevated.    08/19/2019 Patient reported feeling better overall since last CCM visit. She reports improvements in BGs with being on Ozempic for ~1 month. Patient impressed with results and has also noted weight loss. Patient hopes to continue on this and wants to proceed with Ozempic patient assistance program. Patient also reports not drinking soda as she started to drink carbonated water instead.    Diet: patient endorses eating a lot of potatoes, oatmeal, Cheerios. She is aware of not eating correct food choices  Exercise: patient notes she is unable  to exercise since she suffers from chronic pain.  Their chronic conditions include: HTN, Asthma, GERD, DM, Hypothyroidism, Arthritis, skin lesions, vitamin B12 deficiency, anxiety, insomnia  Office Visits: 08/03/2019- Alysia Penna, MD- Patient presented for office visit for BP and depression/ anxiety. BP and BGs improved. Ozempic increased to 1mg  weekly. Patient to continue Effexor XR 150mg  daily. Patient to follow up in 1 month.   07/13/2019- Alysia Penna, MD- Patient presented for office visit for DM and depression/ anxiety. Patient given samples of Ozempic 0.5mg  one weekly and glipizide to be stopped. Zoloft stopped and switched to Effexor XR 150mg  daily. Recheck in 3 to 4 weeks.   05/02/2019- Alysia Penna, MD- Patient presented for office visit for well exam. Patient to obtain fasting labs. Patient referred to dermatology for likely basal cell cancer on nose. Patient referred to pulmonary for sleep apnea work up. For reported elevated home BP readings, diltiazem CD increased to 360mg  daily.   Consult Visit: 02/18/2018- Orthopedics- Patient presented to Dwana Melena, PA for s/p achilles tendon rupture. Patient to cover wound with mupirocin. Patient to use cam walker as opposed to barefoot. Follow up in 3 weeks for recheck.   Medications: Outpatient Encounter Medications as of 08/29/2019  Medication Sig  . aspirin 81 MG EC tablet Take 81 mg by mouth daily.    [name] atorvastatin (LIPITOR) 10 MG tablet TAKE 1 TABLET BY MOUTH EVERY DAY  . Cyanocobalamin (VITAMIN B 12 PO) Take 1,000 Units by mouth daily.  . cyclobenzaprine (FLEXERIL) 10 MG tablet TAKE 1 TABLET BY MOUTH THREE TIMES A DAY  AS NEEDED FOR MUSCLE SPASMS  . diclofenac (VOLTAREN) 75 MG EC tablet TAKE 1 TABLET BY MOUTH TWICE A DAY  . diltiazem (CARDIZEM CD) 360 MG 24 hr capsule Take 1 capsule (360 mg total) by mouth daily.  . fenofibrate 160 MG tablet TAKE 1 TABLET BY MOUTH DAILY  . gabapentin (NEURONTIN) 300 MG capsule TAKE 2 CAPSULES (600 MG  TOTAL) BY MOUTH 3 (THREE) TIMES DAILY.  [name] glucose blood (TRUETEST TEST) test strip Use as instructed  . hydrochlorothiazide (HYDRODIURIL) 25 MG tablet TAKE 1 TABLET BY MOUTH EVERY DAY  . levothyroxine (SYNTHROID) 50 MCG tablet TAKE 1 TABLET (50 MCG TOTAL) BY MOUTH DAILY.  . metFORMIN (GLUCOPHAGE-XR) 500 MG 24 hr tablet TAKE 1 TABLET BY MOUTH TWICE A DAY WITH MEAL  . metoprolol succinate (TOPROL-XL) 100 MG 24 hr tablet TAKE 1 TABLET BY MOUTH DAILY. TAKE WITH OR IMMEDIATELY FOLLOWING A MEAL  . mupirocin ointment (BACTROBAN) 2 % Apply topically 3 (three) times daily.  [name] omeprazole (PRILOSEC) 20 MG capsule TAKE 1 CAPSULE BY MOUTH TWICE A DAY  . potassium chloride (KLOR-CON M10) 10 MEQ tablet TAKE 1 TABLET BY MOUTH EVERY DAY  . Semaglutide,0.25 or 0.5MG /DOS, (OZEMPIC, 0.25 OR 0.5 MG/DOSE,) 2 MG/1.5ML SOPN Inject 0.375 mLs (0.5 mg total) into the skin once a week.  . sulfamethoxazole-trimethoprim (BACTRIM DS) 800-160 MG tablet Take 1 tablet by mouth 2 (two) times daily.  . temazepam (RESTORIL) 30 MG capsule Take 1 capsule (30 mg total) by mouth at bedtime as needed for sleep.  . traMADol (ULTRAM) 50 MG tablet TAKE 2 TABLETS BY MOUTH 3 TIMES DAILY AS NEEDED FOR MODERATE PAIN  . venlafaxine XR (EFFEXOR-XR) 150 MG 24 hr capsule TAKE 1 CAPSULE (150 MG TOTAL) BY MOUTH DAILY WITH BREAKFAST.  . cephALEXin (KEFLEX) 500 MG capsule TAKE 1 CAPSULE BY MOUTH THREE TIMES A DAY  . cholecalciferol (VITAMIN D) 1000 UNITS tablet Take 1,000 Units by mouth daily.  . Cinnamon 500 MG TABS Take 2 tablets by mouth daily.  . clobetasol (TEMOVATE) 0.05 % external solution Apply 1 application topically 2 (two) times daily.  . [facility] ULTRA THIN LANCETS MISC 1 strip by Does not apply route as directed.  . desonide (DESOWEN) 0.05 % cream Apply topically 2 (two) times daily.    [name] diltiazem (CARDIZEM CD) 240 MG 24 hr capsule TAKE 1 CAPSULE BY MOUTH EVERY DAY (Patient not taking: Reported on 08/10/2019)  . diphenhydrAMINE (BENADRYL) 25  mg capsule Take 25 mg by mouth as needed.    [name] HYDROmorphone (DILAUDID) 4 MG tablet Take 1 tablet (4 mg total) by mouth every 6 (six) hours as needed for severe pain.  . promethazine (PHENERGAN) 25 MG tablet Take 1 tablet (25 mg total) by mouth every 6 (six) hours as needed for nausea.  . sitaGLIPtin (JANUVIA) 100 MG tablet Take 1 tablet (100 mg total) by mouth daily. (Patient not taking: Reported on 08/10/2019)  . triamcinolone cream (KENALOG) 0.1 % Apply 1 application topically 2 (two) times daily.  . vitamin B-12 (CYANOCOBALAMIN) 1000 MCG tablet Take 1,000 mcg by mouth daily. 2500 mcg once daily  . vitamin E (VITAMIN E) 180 MG (400 UNITS) capsule Take 400 Units by mouth daily.   No facility-administered encounter medications on file as of 08/29/2019.    Current Diagnosis/Assessment:  Goals Addressed            This Visit's Progress   . Pharmacy Care Plan       CARE PLAN ENTRY  Current Barriers:  . Chronic Disease Management support, education, and care coordination needs related to Hypertension, Hyperlipidemia, Diabetes, Depression, and Anxiety   Hypertension . Pharmacist Clinical Goal(s): o Over the next 30 days, patient will work with PharmD and providers to achieve BP goal <130/80 . Current regimen:  o diltiazem (Cardizem CD) 360mg , 1 capsule daily o metoprolol succinate 100mg  once daily (RESTART) . Interventions: o After consulting with Dr. Sarajane Jews, restart metoprolol succinate 100mg , once daily . Patient self care activities - Over the next 30 days, patient will: o Check BP as instructed, document, and provide at future appointments o Ensure daily salt intake < 2300 mg/day  Hyperlipidemia/ hypertriglyceridemia . Pharmacist Clinical Goal(s): o Over the next 30 days, patient will work with PharmD and providers to achieve LDL goal < 70 . Current regimen:  o atorvastatin 10mg , 1 tablet daily o fenofibrate 160mg , 1 tablet daily  . Interventions: o Discussed lifestyle  modifications (diet).  o Provided resources to obtain fruits/ vegetables.  . Patient self care activities - Over the next 30 days, patient will: o Work on incorporating more fruits and vegetables in diet.   Diabetes . Pharmacist Clinical Goal(s): o Over the next 30 days, patient will work with PharmD and providers to maintain A1c goal <7% . Current regimen:  o metformin XR 500mg  1 tablet twice daily  o semaglutide (Ozempic) 0.5mg  once a week . Interventions: o Reviewed instructions to complete [employer] patient assistance forms for Ozempic and instructed to return to [place] office.  . Patient self care activities - Over the next 30 days, patient will: o Check blood sugar as directed, document, and provide at future appointments o Complete patient assistance forms and either mail back or bring back to [place] office. Attention: Anne Ng (clinical pharmacist)   Anxiety/ depression  . Pharmacist Clinical Goal(s) o Over the next 30 days, patient will work with PharmD and providers to address concerns and provide further management.  . Current regimen:   venlafaxine XR 150mg , 1 capsule one daily with breakfast  . Patient self care activities - Over the next 30 days, patient will: o Continue current medications and follow up visits.   Medication management . Pharmacist Clinical Goal(s): o Over the next 30 days, patient will work with PharmD and providers to achieve optimal medication adherence . [facility] . Interventions o Comprehensive medication review performed. o Utilize [facility] for medication synchronization, packaging and delivery . Patient self care activities - Over the next 30 days, patient will: o Take medications as prescribed o Report any questions or concerns to PharmD and/or provider(s)  Please see past updates related to this goal by clicking on the "Past Updates" button in the selected goal          SDOH Interventions     Most Recent  Value  SDOH Interventions  Financial Strain Interventions Other (Comment)  [Reviewed [employer] patient assistance application and items required to be turned in. Patient to mail in completed application to [place] office.]     SDOH Interventions     Most Recent Value  SDOH Interventions  Financial Strain Interventions Other (Comment)  [Reviewed Novo[employer] patient assistance application and items required to be turned in. Patient to mail in completed application to [place] office.]       Diabetes   Recent Relevant Labs: Lab Results  Component Value Date/Time   HGBA1C 9.3 (H) 05/02/2019 02:51 PM   HGBA1C 7.9 (A) 11/30/2018 02:11 PM   HGBA1C 7.5 (H) 03/17/2018 04:39  PM   MICROALBUR 2.5 (H) 02/21/2014 02:22 PM   MICROALBUR 1.7 10/05/2013 10:42 AM    Checking BGs: about 2-3 times per week.   Recent BGs readings: 125- 127 (improved)  Prior blood sugar readings: 150- 273 mg/dl   Patient has failed these meds in past: glipizide  Patient is currently uncontrolled (based on A1c- but improved FBGs) on the following medications:   semaglutide (Ozempic) 0.5mg  once a week  metformin XR 500mg  1 tablet twice daily    Last diabetic Eye exam: No results found for: HMDIABEYEEXA  - Patient notes last eye exam was 4 years ago. Counseled importance of diabetic eye exam every 1 to 2 years.  Last diabetic Foot exam: No results found for: HMDIABFOOTEX  - Discussed foot exams at least once yearly. Patient endorses daily feet inspections at home.   We discussed:      We reviewed items required for [employer] patient assistance application for Ozempic.   Plan  Continue current medications.  Patient to return patient assistance application to [place] office. Address need for A1c at follow up visit.    Hyperlipidemia   Lipid Panel     Component Value Date/Time   CHOL 191 05/02/2019 1451   TRIG 268.0 (H) 05/02/2019 1451   TRIG 160 (H) 12/30/2005 1425   HDL 48.30 05/02/2019  1451   CHOLHDL 4 05/02/2019 1451   VLDL 53.6 (H) 05/02/2019 1451   LDLCALC 76 04/01/2017 1544   LDLDIRECT 107.0 05/02/2019 1451    The 10-year ASCVD risk score Mikey Bussing DC Jr., et al., 2013) is: 6.6%  Patient has failed these meds in past: none   Patient is currently controlled on the following medications:   atorvastatin 10mg , 1 tablet daily  fenofibrate 160mg , 1 tablet daily   We discussed:  diet and exercise extensively - How to reduce cholesterol through diet/weight management and physical activity.    - We discussed how a diet high in plant sterols (fruits/vegetables/nuts/whole grains/legumes) may reduce your cholesterol.   - Encouraged increasing fiber to a daily intake of 10-25g/day   Plan Continue current medications.   Hypertension  BP today is: >140/62mmHg   Office blood pressures are  BP Readings from Last 3 Encounters:  08/03/19 140/80  07/13/19 (!) 150/70  05/02/19 140/80  Patient was these meds in the past: - lisinopril (facial swelling) - metoprolol succinate 100mg , HCTZ 25mg  (patient reports these were stopped when she started diltiazem) - no notes found in patient chart stating this.   Patient checks BP at home daily  Patient home BP readings are ranging: 168/104  Patient is uncontrolled on:   diltiazem (Cardizem CD) 360mg , 1 capsule daily   We discussed   Stress and pain and affect on blood pressure  Plan Patient to restart metoprolol succinate 100mg  once daily ([facility]  Continue current medications.   Hypothyroidism   TSH  Date Value Ref Range Status  05/02/2019 1.91 0.35 - 4.50 uIU/mL Final    No components found for: T4  Patient has failed these meds in past: none  Patient is currently controlled on the following medications:   levothyroxine 29mcg, 1 tablet daily   Plan Continue current medications  Asthma  Patient notes she was diagnosed with asthma over 20 years ago. Working conditions made breathing worse. She  denies having any breathing problems at the moment.   Eosinophil count:   Lab Results  Component Value Date/Time   EOSPCT 1.4 05/02/2019 02:51 PM  %  Eos (Absolute):  Lab Results  Component Value Date/Time   EOSABS 0.1 05/02/2019 02:51 PM   Tobacco Status:  Social History   Tobacco Use  Smoking Status Former Smoker  Smokeless Tobacco Never Used  - Patient endorse stopped smoking 12-14 years ago.   Patient has failed these meds in past: patient does not remember names of inhalers ("made her heart do weird things and sent her into panic").   Patient is currently controlled on the following medications:  - currently not on any inhalers.   Using maintenance inhaler regularly? No Frequency of rescue inhaler use:  never  We discussed:   rescue inhalers that do not affect heart as much (selective beta-2 agonist; i.e. Xopenex)  Plan Patient wants to continue as is.   GERD  Patient has failed these meds in past: none   Patient is currently controlled on the following medications:   omeprazole 20mg , 1 capsule twice daily    We discussed:  Discussed non-pharmacological interventions for acid reflux. Take measures to prevent acid reflux, such as avoiding spicy foods, avoiding caffeine, avoid laying down a few hours after eating, and raising the head of the bed.  Plan Continue current medications.    Vitamin B12 deficiency   Patient has failed these meds in past: none  Patient is currently managed on the following medications:   cyanocobalamin 1000 units daily   Plan Continue current medications. Recommend vitamin B12 level.   Arthritis/ muscle spasms    Patient endorses medications for a long time and it's the only thing that works for her back pain.   Patient has failed these meds in past: Opana (oxymorphone)   Patient is currently controlled on the following medications:  diclofenac 75mg , 1 tablet twice daily   tramadol 50mg , 2  tablets three times daily as needed for moderate pain   hydromorphone 4mg , 1 tablet every six hours as needed for severe pain    cyclobenzaprine 10mg , 1 tablet three times daily as needed for muscle spasms   We discussed:  concomitant use of 2 opioids and cyclobenzaprine increases risk of CNS depression  - Patient states she relies more on tramadol. If she notices pain is severe she still stop taking tramadol and will take a dose of hydromorphone (which she states is rare) and she limits use of cyclobenzaprine to 1 tablet at night.   Plan Continue current medications.   Diabetic neuropathy   Patient has failed these meds in past: none  Patient is currently controlled on the following medications:   gabapentin 300mg , 2 capsules three times daily   Plan  Continue current medications.   Skin lesions     Patient has failed these meds in past: none  Patient is currently controlled on the following medications:   mupirocin 2% ointment, apply three times daily   Bactrim DS, 1 tablet twice daily  triamcinolone 0.1% cream, apply twice daily  Plan Provided patient information of referral made to derm.  Continue current medications.   Anxiety / depression   Patient reports having better in terms of depression/ mood since switching to venlafaxine a few weeks ago. She   Patient has failed these meds in past: sertraline Patient is currently controlled on the following medications:   venlafaxine XR 150mg , 1 capsule one daily with breakfast   Plan Continue current medications  Reassess in 1 month.    Insomnia   Patient notes using temazepam 1-2x per week.   Patient has failed these meds in past:  none  Patient is currently uncontrolled on the following medications:   temazepam 30mg , 1 capsule at bedtime as needed for sleep  We discussed:  Discussed non-pharmacological interventions for insomnia. (Avoid napping, limit exposure to technology near bedtime, etc).     Plan Continue current medications.   Readdress at follow up (consider extended release melatonin).   Aspirin therapy   Patient is currently managed on the following medications:   ASA 81mg  once daily   Plan Continue current medications.   Vaccines   Reviewed and discussed patient's vaccination history.    Immunization History  Administered Date(s) Administered  . Influenza Whole 12/27/2004, 11/27/2008  . Influenza,inj,Quad PF,6+ Mos 10/08/2012, 10/05/2013, 10/06/2014, 01/04/2016, 10/29/2016, 12/23/2017, 11/30/2018  . PFIZER SARS-COV-2 Vaccination 05/20/2019, 06/15/2019  . Tdap 04/01/2017   Plan Reassess at follow up for shingles vaccine.   Medication Management   Patient organizes medications:  husbands helps her organize medications and makes she takes her meds.  Financial barriers: Ozempic cost prohibitive Transportation: patient does not drive; spouse takes patient to appointments and picks up medications [facility] Adherence: no gaps in refill history  Plan:  Patient to return [employer] patient assistance application. Reviewed with patient if approved, Ozempic will be mailed to [facility] office and will need to be picked up by patient.  Discussed [facility] services to coordinate medication delivery.   Verbal consent obtained for [facility] enhanced pharmacy services (medication synchronization, adherence packaging, delivery coordination). A medication sync plan was created to allow patient to get all medications delivered once every 30 to 90 days per patient preference. Patient understands they have freedom to choose pharmacy and clinical pharmacist will coordinate care between all prescribers and [facility].  Patient requests deliveries for morning route and 90 day supply.  Patient requested list of medication costs in order to coordinate deliveries to be able to separate medication costs. Patient defers med sync since  she states it would not be cost-effective for her.       Follow up Follow up appointment in 1 month for DM, depression, BP, and insomnia reassessment.     Anson Crofts, PharmD, BCACP Clinical Pharmacist [facility] [place] [phone]

## 2019-08-31 ENCOUNTER — Telehealth: Payer: PPO

## 2019-09-02 ENCOUNTER — Telehealth: Payer: Self-pay

## 2019-09-02 DIAGNOSIS — E1165 Type 2 diabetes mellitus with hyperglycemia: Secondary | ICD-10-CM

## 2019-09-02 NOTE — Progress Notes (Signed)
Received completed [employer] patient assistance forms for Ozempic from patient.   Prescriber section completed by Dr. Sarajane Jews.   Faxed application to [employer]:  [phone].

## 2019-09-05 ENCOUNTER — Other Ambulatory Visit: Payer: Self-pay | Admitting: Family Medicine

## 2019-09-06 ENCOUNTER — Other Ambulatory Visit: Payer: Self-pay | Admitting: Family Medicine

## 2019-09-06 NOTE — Telephone Encounter (Signed)
Please advise. The Rx is not on the patients med list.

## 2019-09-09 ENCOUNTER — Telehealth: Payer: Self-pay

## 2019-09-09 NOTE — Progress Notes (Signed)
°  Chronic Care Management   Outreach Note  09/09/2019 Name: Christina [name] MRN: [id number] DOB: 04-12-1965  Referred by: Laurey Morale, MD Reason for referral : No chief complaint on file.  BP Readings from Last 3 Encounters:  08/03/19 140/80  07/13/19 (!) 150/70  05/02/19 140/80    Lab Results  Component Value Date   HGBA1C 9.3 (H) 05/02/2019    Verbal consent obtained for [facility] enhanced pharmacy services (medication synchronization, adherence packaging, delivery coordination). A medication sync plan was created to allow patient to get all medications delivered once every 30 to 90 days per patient preference. Patient understands they have freedom to choose pharmacy and clinical pharmacist will coordinate care between all prescribers and [facility].  Coordinated medications delivery with pharmacy as seen below. Patient declined medication synchronization.   Patient requested to obtain medications through [facility]  90 Days    Medication Name Last Fill Date & Day Supply  Anticipated next due date  Format: MM/DD/YY      Atorvastatin 10mg ; one tab daily 07/18/2019 (90 DS) 10/14/2019  Cyclobenzaprine 10mg ; one tab three times a day as needed for muscle pain  08/15/2019 (30 DS) 09/13/2019  Diclofenac 75mg ; one tab twice a day 08/15/2019 (30 DS)  09/13/2019  Fenofibrate 160mg ; one tab daily 08/14/2019 (90 DS)  11/14/2019  Gabapentin 300mg ; two cap three times a day  08/19/2019 (30 DS)  09/16/2019  Levothyroxine 51mcg; one tab daily 09/06/2019 (90 DS) 12/05/2019  Metoprolol Succinate 100mg ; one tab daily after meal 08/22/2019 (90 DS) 11/18/2019  Omeprazole 20mg ; one cap twice a day 08/11/2019 (90 DS) 11/09/2019  Potassium Chloride 55mEQ; one tab daily 08/14/2019 (90 DS) 11/11/2019  Bactrim DS, 1 tablet twice daily  09/13/2019  Venlafaxine ER 150mg ; one tab daily 08/08/2019 (90 DS)  11/06/2019  Diltiazem ER (CD) 360mg ; one cap daily 08/05/2019 (90 DS)  11/02/2019  Metformin  500mg ; one tab twice a day with meals 06/29/2019 (90 DS) 09/27/2019  Tramadol 50mg ; two tabs 3 times a day PRN 06/07/2019 (40 DS) 09/13/2019  Temazepam 30mg ; one cap daily PRN for sleep  03/06/2019 (30 DS)  09/13/2019     Profile transferred requested from [facility].   Confirmed delivery date of 09/13/2019, advised patient that pharmacy will contact them the morning of delivery.   Anson Crofts, PharmD Clinical Pharmacist [facility] [place] [phone]

## 2019-09-12 ENCOUNTER — Telehealth: Payer: Self-pay

## 2019-09-12 MED ORDER — LEVOTHYROXINE SODIUM 50 MCG PO TABS: ORAL_TABLET | ORAL | 3 refills | Status: DC

## 2019-09-12 MED ORDER — ATORVASTATIN CALCIUM 10 MG PO TABS: 10.0000 mg | ORAL_TABLET | Freq: Every day | ORAL | 3 refills | Status: DC

## 2019-09-12 MED ORDER — TEMAZEPAM 30 MG PO CAPS: 30.0000 mg | ORAL_CAPSULE | Freq: Every evening | ORAL | 1 refills | Status: DC | PRN

## 2019-09-12 MED ORDER — SULFAMETHOXAZOLE-TRIMETHOPRIM 800-160 MG PO TABS: 1.0000 | ORAL_TABLET | Freq: Two times a day (BID) | ORAL | 3 refills | Status: DC

## 2019-09-12 MED ORDER — TRAMADOL HCL 50 MG PO TABS: ORAL_TABLET | ORAL | 1 refills | Status: DC

## 2019-09-12 NOTE — Telephone Encounter (Signed)
-----   Message from Earnie Larsson, [facility] sent at 09/12/2019  9:02 AM EDT ----- Hi Dr. Sarajane Jews,   I am working on transitioning patient to [facility] services.   We were able to obtain almost all of there prescriptions from her previous pharmacy.    However we do need refills for the following:   Bactrim DS, 1 tablet twice daily  Tramadol 50mg , 2 tablets three times daily as needed  Temazepam 30mg , 1 capsule at bedtime as needed  Levothyroxine 50 mcg, 1 tablet once daily  Atorvastatin 10mg , 1 tablet once daily   Your help is appreciated!   Anne Ng

## 2019-09-12 NOTE — Progress Notes (Signed)
Received voicemail from patient stating she is out of Ozempic this week.   Returned patient's call and discussed Novonordisk application. Application was faxed on 09/02/2019. Pending confirmation from them.   Consulting with Dr. Sarajane Jews to approve samples to be delivery with patient's medications from [place], PharmD Clinical Pharmacist [facility] [place] [phone]

## 2019-09-12 NOTE — Telephone Encounter (Signed)
Done

## 2019-09-13 ENCOUNTER — Other Ambulatory Visit: Payer: Self-pay | Admitting: Family Medicine

## 2019-09-13 MED ORDER — OZEMPIC (0.25 OR 0.5 MG/DOSE) 2 MG/1.5ML ~~LOC~~ SOPN: 0.5000 mg | PEN_INJECTOR | SUBCUTANEOUS | 1 refills | Status: DC

## 2019-09-21 ENCOUNTER — Telehealth: Payer: Self-pay | Admitting: Family Medicine

## 2019-09-21 NOTE — Telephone Encounter (Signed)
The patient called needing a refill on 2 medications  atorvastatin (LIPITOR) 10 MG tablet  levothyroxine (SYNTHROID) 50 MCG tablet   I see that the Rx was filled on 08/16 and [facility] but the patient hasn't received them.  Please advise

## 2019-09-21 NOTE — Telephone Encounter (Signed)
Both prescriptions were sent in on 09/12/2019. The patient will need to contact their pharmacy if they have not received this.   ATC to let the patient know to contact their pharmacy. Unable to leave a voice mail.

## 2019-09-23 NOTE — Telephone Encounter (Signed)
ATC, unable to leave a voice mail.  

## 2019-09-27 NOTE — Telephone Encounter (Signed)
ATC, unable to leave a message.  Unable to reach the patient. Message will be closed.

## 2019-09-28 ENCOUNTER — Ambulatory Visit: Payer: PPO | Admitting: Pharmacist

## 2019-09-28 DIAGNOSIS — E1165 Type 2 diabetes mellitus with hyperglycemia: Secondary | ICD-10-CM

## 2019-09-28 DIAGNOSIS — I10 Essential (primary) hypertension: Secondary | ICD-10-CM

## 2019-09-28 NOTE — Chronic Care Management (AMB) (Signed)
Chronic Care Management Pharmacy  Name: Christina [name]  MRN: [id number] DOB: 1965/11/23  Initial Questions: 1. Have you seen any other providers since your last visit? Yes  2. Any changes in your medicines or health? No  Chief Complaint/ HPI  Christina [name],  54 y.o. , female presents for their Follow-Up CCM visit with the clinical pharmacist via telephone due to COVID-19 Pandemic.  PCP : Laurey Morale, MD  Patient reports only doing so so since the last touch base with the pharmacist.   Their chronic conditions include: HTN, Asthma, GERD, DM, Hypothyroidism, Arthritis, skin lesions, vitamin B12 deficiency, anxiety, insomnia  Office Visits: 08/03/2019- Alysia Penna, MD- Patient presented for office visit for BP and depression/ anxiety. BP and BGs improved. Ozempic increased to 1mg  weekly. Patient to continue Effexor XR 150mg  daily. Patient to follow up in 1 month.   07/13/2019- Alysia Penna, MD- Patient presented for office visit for DM and depression/ anxiety. Patient given samples of Ozempic 0.5mg  one weekly and glipizide to be stopped. Zoloft stopped and switched to Effexor XR 150mg  daily. Recheck in 3 to 4 weeks.   05/02/2019- Alysia Penna, MD- Patient presented for office visit for well exam. Patient to obtain fasting labs. Patient referred to dermatology for likely basal cell cancer on nose. Patient referred to pulmonary for sleep apnea work up. For reported elevated home BP readings, diltiazem CD increased to 360mg  daily.   Consult Visit: 02/18/2018- Orthopedics- Patient presented to Dwana Melena, PA for s/p achilles tendon rupture. Patient to cover wound with mupirocin. Patient to use cam walker as opposed to barefoot. Follow up in 3 weeks for recheck.   Medications: Outpatient Encounter Medications as of 09/28/2019  Medication Sig  . aspirin 81 MG EC tablet Take 81 mg by mouth daily.    . cephALEXin (KEFLEX) 500 MG capsule TAKE 1 CAPSULE BY MOUTH THREE TIMES A DAY  .  cholecalciferol (VITAMIN D) 1000 UNITS tablet Take 1,000 Units by mouth daily.  . Cinnamon 500 MG TABS Take 2 tablets by mouth daily.  . clobetasol (TEMOVATE) 0.05 % external solution Apply 1 application topically 2 (two) times daily.  . [facility] ULTRA THIN LANCETS MISC 1 strip by Does not apply route as directed.  . Cyanocobalamin (VITAMIN B 12 PO) Take 1,000 Units by mouth daily.  . cyclobenzaprine (FLEXERIL) 10 MG tablet TAKE 1 TABLET BY MOUTH THREE TIMES A DAY AS NEEDED FOR MUSCLE SPASMS  . desonide (DESOWEN) 0.05 % cream Apply topically 2 (two) times daily.    . diclofenac (VOLTAREN) 75 MG EC tablet TAKE 1 TABLET BY MOUTH TWICE A DAY  . diltiazem (CARDIZEM CD) 240 MG 24 hr capsule TAKE 1 CAPSULE BY MOUTH EVERY DAY (Patient not taking: Reported on 08/10/2019)  . diltiazem (CARDIZEM CD) 360 MG 24 hr capsule Take 1 capsule (360 mg total) by mouth daily.  . diphenhydrAMINE (BENADRYL) 25 mg capsule Take 25 mg by mouth as needed.    . fenofibrate 160 MG tablet TAKE 1 TABLET BY MOUTH DAILY  . gabapentin (NEURONTIN) 300 MG capsule TAKE 2 CAPSULES (600 MG TOTAL) BY MOUTH 3 (THREE) TIMES DAILY.  [name] glucose blood (TRUETEST TEST) test strip Use as instructed  . hydrochlorothiazide (HYDRODIURIL) 25 MG tablet TAKE 1 TABLET BY MOUTH EVERY DAY  . HYDROmorphone (DILAUDID) 4 MG tablet Take 1 tablet (4 mg total) by mouth every 6 (six) hours as needed for severe pain.  [name] levothyroxine (SYNTHROID) 50 MCG tablet TAKE 1 TABLET (50 MCG  TOTAL) BY MOUTH DAILY.  . metFORMIN (GLUCOPHAGE-XR) 500 MG 24 hr tablet TAKE 1 TABLET BY MOUTH TWICE A DAY WITH MEAL  . metoprolol succinate (TOPROL-XL) 100 MG 24 hr tablet TAKE 1 TABLET BY MOUTH DAILY. TAKE WITH OR IMMEDIATELY FOLLOWING A MEAL  . mupirocin ointment (BACTROBAN) 2 % Apply topically 3 (three) times daily.  [name] omeprazole (PRILOSEC) 20 MG capsule TAKE 1 CAPSULE BY MOUTH TWICE A DAY  . potassium chloride (KLOR-CON M10) 10 MEQ tablet TAKE 1 TABLET BY MOUTH EVERY DAY  .  promethazine (PHENERGAN) 25 MG tablet Take 1 tablet (25 mg total) by mouth every 6 (six) hours as needed for nausea.  . Semaglutide,0.25 or 0.5MG /DOS, (OZEMPIC, 0.25 OR 0.5 MG/DOSE,) 2 MG/1.5ML SOPN Inject 0.375 mLs (0.5 mg total) into the skin once a week.  . sertraline (ZOLOFT) 100 MG tablet TAKE 1 AND 1/2 TABLETS BY MOUTH EVERY DAY  . sitaGLIPtin (JANUVIA) 100 MG tablet Take 1 tablet (100 mg total) by mouth daily. (Patient not taking: Reported on 08/10/2019)  . sulfamethoxazole-trimethoprim (BACTRIM DS) 800-160 MG tablet Take 1 tablet by mouth 2 (two) times daily.  . temazepam (RESTORIL) 30 MG capsule Take 1 capsule (30 mg total) by mouth at bedtime as needed for sleep.  . traMADol (ULTRAM) 50 MG tablet TAKE 2 TABLETS BY MOUTH 3 TIMES DAILY AS NEEDED FOR MODERATE PAIN  . triamcinolone cream (KENALOG) 0.1 % Apply 1 application topically 2 (two) times daily.  [name] venlafaxine XR (EFFEXOR-XR) 150 MG 24 hr capsule TAKE 1 CAPSULE (150 MG TOTAL) BY MOUTH DAILY WITH BREAKFAST.  [name] vitamin B-12 (CYANOCOBALAMIN) 1000 MCG tablet Take 1,000 mcg by mouth daily. 2500 mcg once daily  . vitamin E (VITAMIN E) 180 MG (400 UNITS) capsule Take 400 Units by mouth daily.  . [DISCONTINUED] atorvastatin (LIPITOR) 10 MG tablet Take 1 tablet (10 mg total) by mouth daily.  . [DISCONTINUED] potassium chloride (KLOR-CON 10) 10 MEQ CR tablet Take 1 tablet (10 mEq total) by mouth daily.   No facility-administered encounter medications on file as of 09/28/2019.    Current Diagnosis/Assessment:  Goals Addressed            This Visit's Progress   . Pharmacy Care Plan       CARE PLAN ENTRY  Current Barriers:  . Chronic Disease Management support, education, and care coordination needs related to Hypertension, Hyperlipidemia, Diabetes, Depression, and Anxiety   Hypertension . Pharmacist Clinical Goal(s): o Over the next 30 days, patient will work with PharmD and providers to achieve BP goal <130/80 . Current regimen:   o diltiazem (Cardizem CD) 360mg , 1 capsule daily o metoprolol succinate 100mg  once daily . Interventions: o We discussed the importance of taking metoprolol daily instead of every other day. . Patient self care activities - Over the next 30 days, patient will: o Check BP as instructed, document, and provide at future appointments o Ensure daily salt intake < 2300 mg/day  Hyperlipidemia/ hypertriglyceridemia . Pharmacist Clinical Goal(s): o Over the next 30 days, patient will work with PharmD and providers to achieve LDL goal < 70 . Current regimen:  o atorvastatin 10mg , 1 tablet daily o fenofibrate 160mg , 1 tablet daily  . Interventions: o Discussed lifestyle modifications (diet).  o Provided resources to obtain fruits/ vegetables.  . Patient self care activities - Over the next 30 days, patient will: o Work on incorporating more fruits and vegetables in diet.  o Schedule a follow up visit with Dr. Sarajane Jews to check cholesterol  labs.  Diabetes . Pharmacist Clinical Goal(s): o Over the next 30 days, patient will work with PharmD and providers to maintain A1c goal <7% . Current regimen:  o metformin XR 500mg  1 tablet twice daily  o semaglutide (Ozempic) 1 mg once a week . Interventions: o We discussed checking blood sugars more frequently and alternating times of day . Patient self care activities - Over the next 30 days, patient will: o Check blood sugar as directed, document, and provide at future appointments  Anxiety/ depression  . Pharmacist Clinical Goal(s) o Over the next 30 days, patient will work with PharmD and providers to address concerns and provide further management.  . Current regimen:   venlafaxine XR 150mg , 1 capsule one daily with breakfast  . Patient self care activities - Over the next 30 days, patient will: o Continue current medications and follow up visits.   Medication management . Pharmacist Clinical Goal(s): o Over the next 30 days, patient will work  with PharmD and providers to achieve optimal medication adherence . Current pharmacy: [facility] . Interventions o Comprehensive medication review performed. o Utilize [facility] for medication synchronization, packaging and delivery . Patient self care activities - Over the next 30 days, patient will: o Take medications as prescribed o Report any questions or concerns to PharmD and/or provider(s)  Please see past updates related to this goal by clicking on the "Past Updates" button in the selected goal          SDOH Interventions     Most Recent Value  SDOH Interventions  Financial Strain Interventions Other (Comment)  [Worked on patient assistance for medications]  Transportation Interventions Intervention Not Indicated     SDOH Interventions     Most Recent Value  SDOH Interventions  Financial Strain Interventions Other (Comment)  [Worked on patient assistance for medications]  Transportation Interventions Intervention Not Indicated      SDOH Interventions     Most Recent Value  SDOH Interventions  Financial Strain Interventions Other (Comment)  [Worked on patient assistance for medications]  Transportation Interventions Intervention Not Indicated       Diabetes   Recent Relevant Labs: Lab Results  Component Value Date/Time   HGBA1C 9.3 (H) 05/02/2019 02:51 PM   HGBA1C 7.9 (A) 11/30/2018 02:11 PM   HGBA1C 7.5 (H) 03/17/2018 04:39 PM   MICROALBUR 2.5 (H) 02/21/2014 02:22 PM   MICROALBUR 1.7 10/05/2013 10:42 AM    Checking BGs: about 3 times per week.   Recent BGs readings: 129 - 135 (little change since last month)  Patient has failed these meds in past: glipizide  Patient is currently uncontrolled (based on A1c alone) on the following medications:   semaglutide (Ozempic) 1mg  once a week   metformin XR 500mg  1 tablet twice daily    Last diabetic Eye exam: No results found for: HMDIABEYEEXA  - Patient notes last eye exam was 4 years ago. Counseled  importance of diabetic eye exam every 1 to 2 years.  Last diabetic Foot exam: No results found for: HMDIABFOOTEX  - Discussed foot exams at least once yearly. Patient endorses daily feet inspections at home.   We discussed:      Scheduling a follow up visit with Dr. Sarajane Jews in order to check A1C.  Plan  Continue current medications.  Recommended checking BG more frequently and alternating times of day.  Need A1C rechecked at follow up with Dr. Sarajane Jews.  Hyperlipidemia   Lipid Panel     Component Value Date/Time  CHOL 191 05/02/2019 1451   TRIG 268.0 (H) 05/02/2019 1451   TRIG 160 (H) 12/30/2005 1425   HDL 48.30 05/02/2019 1451   CHOLHDL 4 05/02/2019 1451   VLDL 53.6 (H) 05/02/2019 1451   LDLCALC 76 04/01/2017 1544   LDLDIRECT 107.0 05/02/2019 1451    The 10-year ASCVD risk score Mikey Bussing DC Jr., et al., 2013) is: 6.6%  Patient has failed these meds in past: none   Patient is currently uncontrolled on the following medications:   atorvastatin 10mg , 1 tablet daily  fenofibrate 160mg , 1 tablet daily   We discussed:  diet and exercise extensively - How to reduce cholesterol through diet/weight management and physical activity.    - We discussed how a diet high in plant sterols (fruits/vegetables/nuts/whole grains/legumes) may reduce your cholesterol.     Plan Continue current medications.  Consider switching fenofibrate to fish oil if patient cannot afford. May consider increasing statin intensity for additional LDL and triglyceride lowering benefit.  Hypertension  BP today is: > 140/18mmHg   Office blood pressures are  BP Readings from Last 3 Encounters:  08/03/19 140/80  07/13/19 (!) 150/70  05/02/19 140/80  Patient was these meds in the past: - lisinopril (facial swelling) - metoprolol succinate 100mg , HCTZ 25mg  (patient reports these were stopped when she started diltiazem) - no notes found in patient chart stating this.  Patient checks BP at home daily  Patient  home BP readings are ranging: 160-170s/90s-104; 167/107; HR 77  Patient is uncontrolled on:   diltiazem (Cardizem CD) 360mg , 1 capsule daily   metoprolol succinate 100mg  once daily   We discussed   Stress and pain and affect on blood pressure.  Plan Continue current medications.  Patient was not taking metoprolol every day but was taking every other day. Reinforced the importance of taking metoprolol every day. Continue checking BP at home daily and record.  Hypothyroidism   TSH  Date Value Ref Range Status  05/02/2019 1.91 0.35 - 4.50 uIU/mL Final    No components found for: T4  Patient has failed these meds in past: none  Patient is currently controlled on the following medications:   levothyroxine 67mcg, 1 tablet daily   Plan Continue current medications  Asthma  Patient notes she was diagnosed with asthma over 20 years ago. Working conditions made breathing worse. She denies having any breathing problems at the moment.   Eosinophil count:   Lab Results  Component Value Date/Time   EOSPCT 1.4 05/02/2019 02:51 PM  %                               Eos (Absolute):  Lab Results  Component Value Date/Time   EOSABS 0.1 05/02/2019 02:51 PM   Tobacco Status:  Social History   Tobacco Use  Smoking Status Former Smoker  Smokeless Tobacco Never Used  - Patient endorse stopped smoking 12-14 years ago.   Patient has failed these meds in past: patient does not remember names of inhalers ("made her heart do weird things and sent her into panic").   Patient is currently controlled on the following medications:  - currently not on any inhalers.   Using maintenance inhaler regularly? No Frequency of rescue inhaler use:  never  We discussed:   rescue inhalers that do not affect heart as much (selective beta-2 agonist; i.e. Xopenex)  Plan Continue current management.   GERD  Patient has failed these meds in past: none  Patient is currently controlled on the  following medications:   omeprazole 20mg , 1 capsule twice daily    We discussed:  Discussed non-pharmacological interventions for acid reflux. Take measures to prevent acid reflux, such as avoiding spicy foods, avoiding caffeine, avoid laying down a few hours after eating, and raising the head of the bed.  Plan Continue current medications.    Vitamin B12 deficiency   Patient has failed these meds in past: none  Patient is currently managed on the following medications:   cyanocobalamin 1000 units daily   Plan Continue current medications. Recommend vitamin B12 level.   Arthritis/ muscle spasms    Patient endorses medications for a long time and it's the only thing that works for her back pain.   Patient has failed these meds in past: Opana (oxymorphone)   Patient is currently controlled on the following medications:  diclofenac 75mg , 1 tablet twice daily   tramadol 50mg , 2 tablets three times daily as needed for moderate pain   hydromorphone 4mg , 1 tablet every six hours as needed for severe pain    cyclobenzaprine 10mg , 1 tablet three times daily as needed for muscle spasms   We discussed:  Trial of OTC lidocaine patches for localized pain control to avoid dependence on other pain medications  Plan Continue current medications.  Trial OTC lidocaine patches for back pain prior to prescription.  Diabetic neuropathy   Patient has failed these meds in past: none  Patient is currently controlled on the following medications:   gabapentin 300mg , 2 capsules three times daily   Plan  Continue current medications.   Skin lesions     Patient has failed these meds in past: none  Patient is currently controlled on the following medications:   mupirocin 2% ointment, apply three times daily   Bactrim DS, 1 tablet twice daily  triamcinolone 0.1% cream, apply twice daily  Plan Provided patient information of referral made to derm.  Continue current medications.    Anxiety / depression   Patient reports still being in better spirits since the start of venlafaxine and has noticed a difference in mood.  Patient has failed these meds in past: sertraline Patient is currently controlled on the following medications:   venlafaxine XR 150mg , 1 capsule one daily with breakfast   Plan Continue current medications  Reassess in 1 month.    Insomnia   Patient reports she is still napping during the day.  Patient has failed these meds in past: none  Patient is currently uncontrolled on the following medications:   temazepam 30mg , 1 capsule at bedtime as needed for sleep  We discussed:  Discussed non-pharmacological interventions for insomnia. (Avoid napping, limit exposure to technology near bedtime, etc).   Plan Continue current medications.   Trial of melatonin 2 or 3 mg daily timed release at bedtime. May need to consider a sleep study as patient admits to snoring.   Aspirin therapy   Patient is currently managed on the following medications:   ASA 81mg  once daily   Plan Continue current medications.   Vaccines   Reviewed and discussed patient's vaccination history.    Immunization History  Administered Date(s) Administered  . Influenza Whole 12/27/2004, 11/27/2008  . Influenza,inj,Quad PF,6+ Mos 10/08/2012, 10/05/2013, 10/06/2014, 01/04/2016, 10/29/2016, 12/23/2017, 11/30/2018  . PFIZER SARS-COV-2 Vaccination 05/20/2019, 06/15/2019  . Tdap 04/01/2017   Plan Reassess at follow up for shingles vaccine and pneumovax.   Medication Management   Patient organizes medications:  husbands helps her organize  medications and makes she takes her meds.  Financial barriers: Ozempic cost prohibitive Transportation: patient does not drive; spouse takes patient to appointments and picks up medications Primary pharmacy: [facility] Adherence: no gaps in refill history  Plan:  Continue utilizing [facility] for medications.   Follow  up Follow up appointment in 1 month for DM, depression, BP, and insomnia reassessment.    [name], PharmD Clinical Pharmacist [facility] [place]

## 2019-10-10 ENCOUNTER — Other Ambulatory Visit: Payer: Self-pay | Admitting: Family Medicine

## 2019-10-10 NOTE — Patient Instructions (Signed)
Visit Information  Goals Addressed            This Visit's Progress   . Pharmacy Care Plan       CARE PLAN ENTRY  Current Barriers:  . Chronic Disease Management support, education, and care coordination needs related to Hypertension, Hyperlipidemia, Diabetes, Depression, and Anxiety   Hypertension . Pharmacist Clinical Goal(s): o Over the next 30 days, patient will work with PharmD and providers to achieve BP goal <130/80 . Current regimen:  o diltiazem (Cardizem CD) 360mg , 1 capsule daily o metoprolol succinate 100mg  once daily . Interventions: o We discussed the importance of taking metoprolol daily instead of every other day. . Patient self care activities - Over the next 30 days, patient will: o Check BP as instructed, document, and provide at future appointments o Ensure daily salt intake < 2300 mg/day  Hyperlipidemia/ hypertriglyceridemia . Pharmacist Clinical Goal(s): o Over the next 30 days, patient will work with PharmD and providers to achieve LDL goal < 70 . Current regimen:  o atorvastatin 10mg , 1 tablet daily o fenofibrate 160mg , 1 tablet daily  . Interventions: o Discussed lifestyle modifications (diet).  o Provided resources to obtain fruits/ vegetables.  . Patient self care activities - Over the next 30 days, patient will: o Work on incorporating more fruits and vegetables in diet.  o Schedule a follow up visit with Dr. Sarajane Jews to check cholesterol labs.  Diabetes . Pharmacist Clinical Goal(s): o Over the next 30 days, patient will work with PharmD and providers to maintain A1c goal <7% . Current regimen:  o metformin XR 500mg  1 tablet twice daily  o semaglutide (Ozempic) 1 mg once a week . Interventions: o We discussed checking blood sugars more frequently and alternating times of day . Patient self care activities - Over the next 30 days, patient will: o Check blood sugar as directed, document, and provide at future appointments  Anxiety/ depression   . Pharmacist Clinical Goal(s) o Over the next 30 days, patient will work with PharmD and providers to address concerns and provide further management.  . Current regimen:   venlafaxine XR 150mg , 1 capsule one daily with breakfast  . Patient self care activities - Over the next 30 days, patient will: o Continue current medications and follow up visits.   Medication management . Pharmacist Clinical Goal(s): o Over the next 30 days, patient will work with PharmD and providers to achieve optimal medication adherence . Current pharmacy: [facility] . Interventions o Comprehensive medication review performed. o Utilize [facility] pharmacy for medication synchronization, packaging and delivery . Patient self care activities - Over the next 30 days, patient will: o Take medications as prescribed o Report any questions or concerns to PharmD and/or provider(s)  Please see past updates related to this goal by clicking on the "Past Updates" button in the selected goal           The patient verbalized understanding of instructions provided today and declined a print copy of patient instruction materials.   Telephone follow up appointment with pharmacy team member scheduled for: 1 month  [name], PharmD Clinical Pharmacist [place] [facility] [place] [phone]

## 2019-10-20 ENCOUNTER — Institutional Professional Consult (permissible substitution): Payer: PPO | Admitting: Internal Medicine

## 2019-10-20 ENCOUNTER — Telehealth: Payer: Self-pay | Admitting: Pharmacist

## 2019-10-20 NOTE — Chronic Care Management (AMB) (Addendum)
Chronic Care Management Pharmacy Assistant   Name: Christina [name]  MRN: [id number] DOB: [birth year]  Reason for Encounter: Medication Review  Patient Questions:  1.  Have you seen any other providers since your last visit? No  2.  Any changes in your medicines or health? No   PCP : Laurey Morale, MD  Allergies:   Allergies  Allergen Reactions  . Albuterol   . Antihistamines, Loratadine-Type   . Bee Venom   . Clindamycin/Lincomycin   . Codeine   . Doxycycline   . Lisinopril     REACTION: facial swelling  . Metronidazole     REACTION: rash  . Ondansetron Hcl     REACTION: rash  . Oxycodone-Acetaminophen   . Tylox [Oxycodone-Acetaminophen]   . Vancomycin     REACTION: rash  . Benadryl [Diphenhydramine] Nausea And Vomiting    Medications: Outpatient Encounter Medications as of 10/20/2019  Medication Sig  . aspirin 81 MG EC tablet Take 81 mg by mouth daily.    [name] atorvastatin (LIPITOR) 10 MG tablet TAKE 1 TABLET BY MOUTH EVERY DAY  . cephALEXin (KEFLEX) 500 MG capsule TAKE 1 CAPSULE BY MOUTH THREE TIMES A DAY  . cholecalciferol (VITAMIN D) 1000 UNITS tablet Take 1,000 Units by mouth daily.  . Cinnamon 500 MG TABS Take 2 tablets by mouth daily.  . clobetasol (TEMOVATE) 0.05 % external solution Apply 1 application topically 2 (two) times daily.  . [employer] ULTRA THIN LANCETS MISC 1 strip by Does not apply route as directed.  . Cyanocobalamin (VITAMIN B 12 PO) Take 1,000 Units by mouth daily.  . cyclobenzaprine (FLEXERIL) 10 MG tablet TAKE 1 TABLET BY MOUTH THREE TIMES A DAY AS NEEDED FOR MUSCLE SPASMS  . desonide (DESOWEN) 0.05 % cream Apply topically 2 (two) times daily.    . diclofenac (VOLTAREN) 75 MG EC tablet TAKE 1 TABLET BY MOUTH TWICE A DAY  . diltiazem (CARDIZEM CD) 240 MG 24 hr capsule TAKE 1 CAPSULE BY MOUTH EVERY DAY (Patient not taking: Reported on 08/10/2019)  . diltiazem (CARDIZEM CD) 360 MG 24 hr capsule Take 1 capsule (360 mg total) by mouth daily.  .  diphenhydrAMINE (BENADRYL) 25 mg capsule Take 25 mg by mouth as needed.    . fenofibrate 160 MG tablet TAKE 1 TABLET BY MOUTH DAILY  . gabapentin (NEURONTIN) 300 MG capsule TAKE 2 CAPSULES (600 MG TOTAL) BY MOUTH 3 (THREE) TIMES DAILY.  [name] glucose blood (TRUETEST TEST) test strip Use as instructed  . hydrochlorothiazide (HYDRODIURIL) 25 MG tablet TAKE 1 TABLET BY MOUTH EVERY DAY  . HYDROmorphone (DILAUDID) 4 MG tablet Take 1 tablet (4 mg total) by mouth every 6 (six) hours as needed for severe pain.  [name] levothyroxine (SYNTHROID) 50 MCG tablet TAKE 1 TABLET (50 MCG TOTAL) BY MOUTH DAILY.  . metFORMIN (GLUCOPHAGE-XR) 500 MG 24 hr tablet TAKE 1 TABLET BY MOUTH TWICE A DAY WITH MEAL  . metoprolol succinate (TOPROL-XL) 100 MG 24 hr tablet TAKE 1 TABLET BY MOUTH DAILY. TAKE WITH OR IMMEDIATELY FOLLOWING A MEAL  . mupirocin ointment (BACTROBAN) 2 % Apply topically 3 (three) times daily.  [name] omeprazole (PRILOSEC) 20 MG capsule TAKE 1 CAPSULE BY MOUTH TWICE A DAY  . potassium chloride (KLOR-CON M10) 10 MEQ tablet TAKE 1 TABLET BY MOUTH EVERY DAY  . promethazine (PHENERGAN) 25 MG tablet Take 1 tablet (25 mg total) by mouth every 6 (six) hours as needed for nausea.  . Semaglutide,0.25 or 0.5MG /DOS, (OZEMPIC, 0.25  OR 0.5 MG/DOSE,) 2 MG/1.5ML SOPN Inject 0.375 mLs (0.5 mg total) into the skin once a week.  . sertraline (ZOLOFT) 100 MG tablet TAKE 1 AND 1/2 TABLETS BY MOUTH EVERY DAY  . sitaGLIPtin (JANUVIA) 100 MG tablet Take 1 tablet (100 mg total) by mouth daily. (Patient not taking: Reported on 08/10/2019)  . sulfamethoxazole-trimethoprim (BACTRIM DS) 800-160 MG tablet Take 1 tablet by mouth 2 (two) times daily.  . temazepam (RESTORIL) 30 MG capsule Take 1 capsule (30 mg total) by mouth at bedtime as needed for sleep.  . traMADol (ULTRAM) 50 MG tablet TAKE 2 TABLETS BY MOUTH 3 TIMES DAILY AS NEEDED FOR MODERATE PAIN  . triamcinolone cream (KENALOG) 0.1 % Apply 1 application topically 2 (two) times daily.  [name]  venlafaxine XR (EFFEXOR-XR) 150 MG 24 hr capsule TAKE 1 CAPSULE (150 MG TOTAL) BY MOUTH DAILY WITH BREAKFAST.  [name] vitamin B-12 (CYANOCOBALAMIN) 1000 MCG tablet Take 1,000 mcg by mouth daily. 2500 mcg once daily  . vitamin E (VITAMIN E) 180 MG (400 UNITS) capsule Take 400 Units by mouth daily.  . [DISCONTINUED] potassium chloride (KLOR-CON 10) 10 MEQ CR tablet Take 1 tablet (10 mEq total) by mouth daily.   No facility-administered encounter medications on file as of 10/20/2019.    Current Diagnosis: Patient Active Problem List   Diagnosis Date Noted  . Hyperlipidemia   . Depression with anxiety 07/13/2019  . S/P Achilles tendon repair 02/18/2018  . Psoriasis 10/29/2016  . Morbid obesity ([place]) 01/04/2016  . Hypothyroidism 04/05/2014  . Type 2 diabetes mellitus, uncontrolled ([place]) 03/06/2014  . NECK PAIN 11/28/2009  . BRACHIAL NEURITIS OR RADICULITIS NOS 11/28/2009  . VITAMIN B12 DEFICIENCY 09/14/2009  . HYPERTRIGLYCERIDEMIA 09/14/2009  . Diabetic neuropathy ([place]) 09/11/2009  . ECZEMA 06/07/2009  . Diabetes mellitus without complication ([place]) 93/23/5573  . ALLERGIC URTICARIA 05/02/2009  . ANGIOEDEMA 05/02/2009  . SKIN LESION 11/27/2008  . Anxiety state 07/26/2008  . ABDOMINAL PAIN, GENERALIZED 04/03/2008  . [place] DISEASE, LUMBAR 06/22/2007  . ARTHRITIS 06/11/2007  . LOW BACK PAIN 12/28/2006  . Essential hypertension 09/11/2006  . Asthma 09/11/2006  . GERD 09/11/2006  . History of colonic polyps 09/11/2006    Goals Addressed   None    Reviewed chart for medication changes ahead of medication coordination call. No OVs, Consults, or hospital visits since last care coordination call/Pharmacist visit.  No medication changes indicated.  BP Readings from Last 3 Encounters:  08/03/19 140/80  07/13/19 (!) 150/70  05/02/19 140/80    Lab Results  Component Value Date   HGBA1C 9.3 (H) 05/02/2019     Follow-Up:  Pharmacist Review  Patient obtains medications through Vials  90  Days   Last adherence delivery included: o Diclofenac (VOLTAREN) 75 MG EC take one tablet by mouth twice a day o Gabapentin (NEURONTIN) 300 MG take 2 capsules (600 mg total) by mouth 3 times daily o Levothyroxine (SYNTHROID) 50 MCG take 1 tablet by mouth daily  o Metformin (GLUCOPHAGE-XR) 500 MG 24 hr. take one tablet by mouth twice a day o Sulfamethoxazole-trimethoprim (BACTRIM DS) 800-160 MG take 1 tablet by mouth 2 (two) times daily. o Atorvastatin (LIPITOR) 10 MG take 1 tablet by mouth daily. Patient declined :  o Cyclobenzaprine (FLEXERIL) 10 MG tablet PRN o Temazepam (RESTORIL) 30 MG capsule PRN o Tramadol (Ultram) 50 MG tablet PRN  Patient is due for next adherence delivery on: 10/25/2019. Called patient and reviewed medications and coordinated delivery.  This delivery to include: . Potassium  chloride (KLOR-CON 10) 10 MEQ CR  . Fenofibrate 160 MG tablet . Venlafaxine XR 150 mg 24 hr capsule  Patient does not need refills.  Confirmed delivery date of 10/25/2019, advised patient that pharmacy will contact them the morning of delivery.  [name], [place]  [name], PharmD Clinical Pharmacist [place] [facility] [place] [phone]

## 2019-10-31 ENCOUNTER — Telehealth: Payer: PPO

## 2019-11-01 ENCOUNTER — Ambulatory Visit: Payer: PPO | Admitting: Pharmacist

## 2019-11-01 DIAGNOSIS — E1165 Type 2 diabetes mellitus with hyperglycemia: Secondary | ICD-10-CM

## 2019-11-01 DIAGNOSIS — I10 Essential (primary) hypertension: Secondary | ICD-10-CM

## 2019-11-01 NOTE — Chronic Care Management (AMB) (Signed)
Chronic Care Management Pharmacy  Name: Christina [name]  MRN: [id number] DOB: 25-Nov-1965  Initial Questions: 1. Have you seen any other providers since your last visit? Yes  2. Any changes in your medicines or health? No  Chief Complaint/ HPI  Christina [name],  54 y.o. , female presents for their Follow-Up CCM visit with the clinical pharmacist via telephone due to COVID-19 Pandemic.  PCP : Laurey Morale, MD  Patient reports feeling better since the last touch base with the pharmacist.  Their chronic conditions include: HTN, Asthma, GERD, DM, Hypothyroidism, Arthritis, skin lesions, vitamin B12 deficiency, anxiety, insomnia  Office Visits: 08/03/2019- Alysia Penna, MD- Patient presented for office visit for BP and depression/ anxiety. BP and BGs improved. Ozempic increased to 1mg  weekly. Patient to continue Effexor XR 150mg  daily. Patient to follow up in 1 month.   07/13/2019- Alysia Penna, MD- Patient presented for office visit for DM and depression/anxiety. Patient given samples of Ozempic 0.5mg  one weekly and glipizide to be stopped. Zoloft stopped and switched to Effexor XR 150mg  daily. Recheck in 3 to 4 weeks.   05/02/2019- Alysia Penna, MD- Patient presented for office visit for well exam. Patient to obtain fasting labs. Patient referred to dermatology for likely basal cell cancer on nose. Patient referred to pulmonary for sleep apnea work up. For reported elevated home BP readings, diltiazem CD increased to 360mg  daily.   Consult Visit: 02/18/2018- Orthopedics- Patient presented to Dwana Melena, PA for s/p achilles tendon rupture. Patient to cover wound with mupirocin. Patient to use cam walker as opposed to barefoot. Follow up in 3 weeks for recheck.   Medications: Outpatient Encounter Medications as of 11/01/2019  Medication Sig  . aspirin 81 MG EC tablet Take 81 mg by mouth daily.    [name] atorvastatin (LIPITOR) 10 MG tablet TAKE 1 TABLET BY MOUTH EVERY DAY  . cephALEXin (KEFLEX) 500  MG capsule TAKE 1 CAPSULE BY MOUTH THREE TIMES A DAY  . cholecalciferol (VITAMIN D) 1000 UNITS tablet Take 1,000 Units by mouth daily.  . Cinnamon 500 MG TABS Take 2 tablets by mouth daily.  . clobetasol (TEMOVATE) 0.05 % external solution Apply 1 application topically 2 (two) times daily.  . [employer] ULTRA THIN LANCETS MISC 1 strip by Does not apply route as directed.  . Cyanocobalamin (VITAMIN B 12 PO) Take 1,000 Units by mouth daily.  . cyclobenzaprine (FLEXERIL) 10 MG tablet TAKE 1 TABLET BY MOUTH THREE TIMES A DAY AS NEEDED FOR MUSCLE SPASMS  . desonide (DESOWEN) 0.05 % cream Apply topically 2 (two) times daily.    . diclofenac (VOLTAREN) 75 MG EC tablet TAKE 1 TABLET BY MOUTH TWICE A DAY  . diltiazem (CARDIZEM CD) 240 MG 24 hr capsule TAKE 1 CAPSULE BY MOUTH EVERY DAY (Patient not taking: Reported on 08/10/2019)  . diltiazem (CARDIZEM CD) 360 MG 24 hr capsule Take 1 capsule (360 mg total) by mouth daily.  . diphenhydrAMINE (BENADRYL) 25 mg capsule Take 25 mg by mouth as needed.    . fenofibrate 160 MG tablet TAKE 1 TABLET BY MOUTH DAILY  . gabapentin (NEURONTIN) 300 MG capsule TAKE 2 CAPSULES (600 MG TOTAL) BY MOUTH 3 (THREE) TIMES DAILY.  [name] glucose blood (TRUETEST TEST) test strip Use as instructed  . hydrochlorothiazide (HYDRODIURIL) 25 MG tablet TAKE 1 TABLET BY MOUTH EVERY DAY  . HYDROmorphone (DILAUDID) 4 MG tablet Take 1 tablet (4 mg total) by mouth every 6 (six) hours as needed for severe pain.  [name]  levothyroxine (SYNTHROID) 50 MCG tablet TAKE 1 TABLET (50 MCG TOTAL) BY MOUTH DAILY.  . metFORMIN (GLUCOPHAGE-XR) 500 MG 24 hr tablet TAKE 1 TABLET BY MOUTH TWICE A DAY WITH MEAL  . metoprolol succinate (TOPROL-XL) 100 MG 24 hr tablet TAKE 1 TABLET BY MOUTH DAILY. TAKE WITH OR IMMEDIATELY FOLLOWING A MEAL  . mupirocin ointment (BACTROBAN) 2 % Apply topically 3 (three) times daily.  [name] omeprazole (PRILOSEC) 20 MG capsule TAKE 1 CAPSULE BY MOUTH TWICE A DAY  . potassium chloride (KLOR-CON M10) 10  MEQ tablet TAKE 1 TABLET BY MOUTH EVERY DAY  . promethazine (PHENERGAN) 25 MG tablet Take 1 tablet (25 mg total) by mouth every 6 (six) hours as needed for nausea.  . Semaglutide,0.25 or 0.5MG /DOS, (OZEMPIC, 0.25 OR 0.5 MG/DOSE,) 2 MG/1.5ML SOPN Inject 0.375 mLs (0.5 mg total) into the skin once a week.  . sertraline (ZOLOFT) 100 MG tablet TAKE 1 AND 1/2 TABLETS BY MOUTH EVERY DAY  . sitaGLIPtin (JANUVIA) 100 MG tablet Take 1 tablet (100 mg total) by mouth daily. (Patient not taking: Reported on 08/10/2019)  . sulfamethoxazole-trimethoprim (BACTRIM DS) 800-160 MG tablet Take 1 tablet by mouth 2 (two) times daily.  . temazepam (RESTORIL) 30 MG capsule Take 1 capsule (30 mg total) by mouth at bedtime as needed for sleep.  . traMADol (ULTRAM) 50 MG tablet TAKE 2 TABLETS BY MOUTH 3 TIMES DAILY AS NEEDED FOR MODERATE PAIN  . triamcinolone cream (KENALOG) 0.1 % Apply 1 application topically 2 (two) times daily.  [name] venlafaxine XR (EFFEXOR-XR) 150 MG 24 hr capsule TAKE 1 CAPSULE (150 MG TOTAL) BY MOUTH DAILY WITH BREAKFAST.  [name] vitamin B-12 (CYANOCOBALAMIN) 1000 MCG tablet Take 1,000 mcg by mouth daily. 2500 mcg once daily  . vitamin E (VITAMIN E) 180 MG (400 UNITS) capsule Take 400 Units by mouth daily.  . [DISCONTINUED] potassium chloride (KLOR-CON 10) 10 MEQ CR tablet Take 1 tablet (10 mEq total) by mouth daily.   No facility-administered encounter medications on file as of 11/01/2019.    Current Diagnosis/Assessment:  Goals Addressed            This Visit's Progress   . Pharmacy Care Plan       CARE PLAN ENTRY  Current Barriers:  . Chronic Disease Management support, education, and care coordination needs related to Hypertension, Hyperlipidemia, Diabetes, Depression, and Anxiety   Hypertension . Pharmacist Clinical Goal(s): o Over the next 60 days, patient will work with PharmD and providers to achieve BP goal <130/80 . Current regimen:  o diltiazem (Cardizem CD) 360mg , 1 capsule  daily o metoprolol succinate 100mg  once daily . Interventions: o We discussed the importance of continued checking blood pressure and the correct technique for checking blood pressure (sitting down with legs uncrossed and not talking, avoiding caffeine for 60 minutes before, and going to the bathroom prior to checking) . Patient self care activities - Over the next 30 days, patient will: o Check BP as instructed, document, and provide at future appointments o Ensure daily salt intake < 2300 mg/day  Hyperlipidemia/ hypertriglyceridemia . Pharmacist Clinical Goal(s): o Over the next 60 days, patient will work with PharmD and providers to achieve LDL goal < 70 . Current regimen:  o atorvastatin 10mg , 1 tablet daily o fenofibrate 160mg , 1 tablet daily  . Interventions: o Discussed lifestyle modifications (diet).  o Provided resources to obtain fruits/ vegetables.  . Patient self care activities - Over the next 60 days, patient will: o Work on incorporating  more fruits and vegetables in diet.  o Schedule a follow up visit with Dr. Sarajane Jews to check cholesterol labs.  Diabetes . Pharmacist Clinical Goal(s): o Over the next 60 days, patient will work with PharmD and providers to maintain A1c goal <7% . Current regimen:  o metformin XR 500mg  1 tablet twice daily  o semaglutide (Ozempic) 1 mg once a week . Interventions: o We discussed checking blood sugars more frequently and alternating times of day and keeping a log book to write down when she is checking . Patient self care activities - Over the next 60 days, patient will: o Check blood sugar as directed, document, and provide at future appointments  Anxiety/ depression  . Pharmacist Clinical Goal(s) o Over the next 60 days, patient will work with PharmD and providers to address concerns and provide further management.  . Current regimen:   venlafaxine XR 150mg , 1 capsule one daily with breakfast  . Patient self care activities - Over the  next 30 days, patient will: o Continue current medications and follow up visits.   Medication management . Pharmacist Clinical Goal(s): o Over the next 60 days, patient will work with PharmD and providers to achieve optimal medication adherence . Current pharmacy: [facility] . Interventions o Comprehensive medication review performed. o Utilize [facility] pharmacy for medication synchronization, packaging and delivery . Patient self care activities - Over the next 60 days, patient will: o Take medications as prescribed o Report any questions or concerns to PharmD and/or provider(s)  Please see past updates related to this goal by clicking on the "Past Updates" button in the selected goal                Diabetes  A1C goal of < 7  Recent Relevant Labs: Lab Results  Component Value Date/Time   HGBA1C 9.3 (H) 05/02/2019 02:51 PM   HGBA1C 7.9 (A) 11/30/2018 02:11 PM   HGBA1C 7.5 (H) 03/17/2018 04:39 PM   MICROALBUR 2.5 (H) 02/21/2014 02:22 PM   MICROALBUR 1.7 10/05/2013 10:42 AM    Checking BGs: about 3 times per week.   Recent BGs readings: 130-156 (sometimes before she eats or after she eats)   Patient has failed these meds in past: glipizide   Patient is currently uncontrolled (based on A1c alone) on the following medications:   semaglutide (Ozempic) 1mg  once a week   metformin XR 500mg  1 tablet twice daily    Last diabetic Eye exam: No results found for: HMDIABEYEEXA  - Patient notes last eye exam was 4 years ago. Counseled importance of diabetic eye exam every 1 to 2 years.   Last diabetic Foot exam: No results found for: HMDIABFOOTEX  - Discussed foot exams at least once yearly. Patient endorses daily feet inspections at home.   We discussed:    Scheduling a follow up visit with Dr. Sarajane Jews in order to check A1C.  The importance of keeping a blood sugar log and documenting with reference to food.  Plan  Continue current medications.  Patient will get a notebook  to write down blood sugars in relation to meals.  Hyperlipidemia  LDL goal < 70  Lipid Panel     Component Value Date/Time   CHOL 191 05/02/2019 1451   TRIG 268.0 (H) 05/02/2019 1451   TRIG 160 (H) 12/30/2005 1425   HDL 48.30 05/02/2019 1451   CHOLHDL 4 05/02/2019 1451   VLDL 53.6 (H) 05/02/2019 1451   LDLCALC 76 04/01/2017 1544   LDLDIRECT 107.0 05/02/2019 1451  The 10-year ASCVD risk score Mikey Bussing DC Jr., et al., 2013) is: 6.6%  Patient has failed these meds in past: none   Patient is currently uncontrolled on the following medications:   atorvastatin 10mg , 1 tablet daily  fenofibrate 160mg , 1 tablet daily   We discussed:  diet and exercise extensively - How to reduce cholesterol through diet/weight management and physical activity.    - We discussed how a diet high in plant sterols (fruits/vegetables/nuts/whole grains/legumes) may reduce your cholesterol.     Plan Continue current medications.  May consider increasing statin intensity for additional LDL and triglyceride lowering benefit.  Hypertension  BP goal is < 130/26mmHg   Office blood pressures are  BP Readings from Last 3 Encounters:  08/03/19 140/80  07/13/19 (!) 150/70  05/02/19 140/80   Patient was these meds in the past: - lisinopril (facial swelling) - metoprolol succinate 100mg , HCTZ 25mg  (patient reports these were stopped when she started diltiazem) - no notes found in patient chart stating this.  Patient checks BP at home daily  Patient home BP readings are ranging: 134/89 (HR 77), highest: 150/11 (HR 83)  Patient is uncontrolled on:   diltiazem (Cardizem CD) 360mg , 1 capsule daily - in PM  metoprolol succinate 100mg  once daily  - in AM  We discussed   Stress and pain and affect on blood pressure.  Plan Continue current medications.  Continue checking BP at home daily and record including time of day.  Hypothyroidism   TSH  Date Value Ref Range Status  05/02/2019 1.91 0.35 -  4.50 uIU/mL Final    No components found for: T4  Patient has failed these meds in past: none  Patient is currently controlled on the following medications:   levothyroxine 12mcg, 1 tablet daily   Plan Continue current medications  Asthma  Patient notes she was diagnosed with asthma over 20 years ago. Working conditions made breathing worse. She denies having any breathing problems at the moment.   Eosinophil count:   Lab Results  Component Value Date/Time   EOSPCT 1.4 05/02/2019 02:51 PM  %                               Eos (Absolute):  Lab Results  Component Value Date/Time   EOSABS 0.1 05/02/2019 02:51 PM   Tobacco Status:  Social History   Tobacco Use  Smoking Status Former Smoker  Smokeless Tobacco Never Used  - Patient endorse stopped smoking 12-14 years ago.   Patient has failed these meds in past: patient does not remember names of inhalers ("made her heart do weird things and sent her into panic").   Patient is currently controlled on the following medications:  - currently not on any inhalers.   Using maintenance inhaler regularly? No Frequency of rescue inhaler use:  never  We discussed:   rescue inhalers that do not affect heart as much (selective beta-2 agonist; i.e. Xopenex)  Plan Continue current management.   GERD  Patient has failed these meds in past: none   Patient is currently controlled on the following medications:   omeprazole 20mg , 1 capsule twice daily    We discussed:  Discussed non-pharmacological interventions for acid reflux. Take measures to prevent acid reflux, such as avoiding spicy foods, avoiding caffeine, avoid laying down a few hours after eating, and raising the head of the bed.  Plan Continue current medications.    Vitamin B12 deficiency  Patient has failed these meds in past: none  Patient is currently managed on the following medications:   cyanocobalamin 1000 units daily   Plan Continue current  medications. Recommend vitamin B12 level.   Arthritis/ muscle spasms    Patient endorses medications for a long time and it's the only thing that works for her back pain.   Patient has failed these meds in past: Opana (oxymorphone)   Patient is currently controlled on the following medications:  diclofenac 75mg , 1 tablet twice daily   tramadol 50mg , 2 tablets three times daily as needed for moderate pain   hydromorphone 4mg , 1 tablet every six hours as needed for severe pain    cyclobenzaprine 10mg , 1 tablet three times daily as needed for muscle spasms   We discussed:  Patient had rash from trial of OTC lidocaine patches; discussed trying Voltaren gel to avoid taking diclofenac tablets  Plan Continue current medications.   Diabetic neuropathy   Patient has failed these meds in past: none  Patient is currently controlled on the following medications:   gabapentin 300mg , 2 capsules three times daily   Plan  Continue current medications.   Skin lesions   Patient has failed these meds in past: none  Patient is currently controlled on the following medications:   mupirocin 2% ointment, apply three times daily   Bactrim DS, 1 tablet twice daily  triamcinolone 0.1% cream, apply twice daily  Plan Provided patient information of referral made to derm.  Continue current medications.   Anxiety / depression   Patient reports being in a better spot and does feel as though this is working.  Patient has failed these meds in past: sertraline Patient is currently controlled on the following medications:   venlafaxine XR 150mg , 1 capsule one daily with breakfast   Plan Continue current medications   Insomnia   Patient reports she is still napping during the day.   Patient has failed these meds in past: none  Patient is currently uncontrolled on the following medications:   temazepam 30mg , 1 capsule at bedtime as needed for sleep  Melatonin 10 mg, 1 capsule at bedtime    We discussed:  Discussed non-pharmacological interventions for insomnia. (Avoid napping, limit exposure to technology near bedtime, etc). ; discussed trying 5 mg dose instead of 10 mg and stressed the importance of timed release to help with staying asleep  Plan Continue current medications.   Scheduled a sleep study for 11/07/19.    Aspirin therapy   Patient is currently managed on the following medications:   ASA 81mg  once daily   Plan Continue current medications.   Vaccines   Reviewed and discussed patient's vaccination history.    Immunization History  Administered Date(s) Administered  . Influenza Whole 12/27/2004, 11/27/2008  . Influenza,inj,Quad PF,6+ Mos 10/08/2012, 10/05/2013, 10/06/2014, 01/04/2016, 10/29/2016, 12/23/2017, 11/30/2018  . PFIZER SARS-COV-2 Vaccination 05/20/2019, 06/15/2019  . Tdap 04/01/2017   Plan Reassess at follow up for shingles vaccine and pneumovax.   Medication Management   Patient organizes medications:  husbands helps her organize medications and makes she takes her meds.  Financial barriers: Ozempic cost prohibitive Transportation: patient does not drive; spouse takes patient to appointments and picks up medications Primary pharmacy: [facility] Adherence: no gaps in refill history  Plan:  Continue utilizing [facility] pharmacy for medications.   Follow up Follow up appointment in 2 months for DM, BP, and insomnia reassessment.    [name], PharmD Clinical Pharmacist [place] [facility] [place]

## 2019-11-01 NOTE — Patient Instructions (Signed)
Hi Christina [name],  As always, it was such a pleasure getting to speak with you over the phone. As discussed, I would like for you to keep a notebook with your blood pressure and blood sugars so we can see if your medicines are working properly. For your blood sugars, please write down when you are checking in relation to your food (before or 2 hours after). It may help to set an alarm as a reminder for this every day.  Please let me know if you need anything before our next touch base!  Best, Maddie  [name], PharmD Clinical Pharmacist [place] [facility] [place]   Visit Information  Goals Addressed            This Visit's Progress   . Pharmacy Care Plan       CARE PLAN ENTRY  Current Barriers:  . Chronic Disease Management support, education, and care coordination needs related to Hypertension, Hyperlipidemia, Diabetes, Depression, and Anxiety   Hypertension . Pharmacist Clinical Goal(s): o Over the next 60 days, patient will work with PharmD and providers to achieve BP goal <130/80 . Current regimen:  o diltiazem (Cardizem CD) 360mg , 1 capsule daily o metoprolol succinate 100mg  once daily . Interventions: o We discussed the importance of continued checking blood pressure and the correct technique for checking blood pressure (sitting down with legs uncrossed and not talking, avoiding caffeine for 60 minutes before, and going to the bathroom prior to checking) . Patient self care activities - Over the next 30 days, patient will: o Check BP as instructed, document, and provide at future appointments o Ensure daily salt intake < 2300 mg/day  Hyperlipidemia/ hypertriglyceridemia . Pharmacist Clinical Goal(s): o Over the next 60 days, patient will work with PharmD and providers to achieve LDL goal < 70 . Current regimen:  o atorvastatin 10mg , 1 tablet daily o fenofibrate 160mg , 1 tablet daily  . Interventions: o Discussed lifestyle modifications (diet).   o Provided resources to obtain fruits/ vegetables.  . Patient self care activities - Over the next 60 days, patient will: o Work on incorporating more fruits and vegetables in diet.  o Schedule a follow up visit with Dr. Sarajane Jews to check cholesterol labs.  Diabetes . Pharmacist Clinical Goal(s): o Over the next 60 days, patient will work with PharmD and providers to maintain A1c goal <7% . Current regimen:  o metformin XR 500mg  1 tablet twice daily  o semaglutide (Ozempic) 1 mg once a week . Interventions: o We discussed checking blood sugars more frequently and alternating times of day and keeping a log book to write down when she is checking . Patient self care activities - Over the next 60 days, patient will: o Check blood sugar as directed, document, and provide at future appointments  Anxiety/ depression  . Pharmacist Clinical Goal(s) o Over the next 60 days, patient will work with PharmD and providers to address concerns and provide further management.  . Current regimen:   venlafaxine XR 150mg , 1 capsule one daily with breakfast  . Patient self care activities - Over the next 30 days, patient will: o Continue current medications and follow up visits.   Medication management . Pharmacist Clinical Goal(s): o Over the next 60 days, patient will work with PharmD and providers to achieve optimal medication adherence . Current pharmacy: [facility] . Interventions o Comprehensive medication review performed. o Utilize [facility] pharmacy for medication synchronization, packaging and delivery . Patient self care activities - Over the next 62  days, patient will: o Take medications as prescribed o Report any questions or concerns to PharmD and/or provider(s)  Please see past updates related to this goal by clicking on the "Past Updates" button in the selected goal           The patient verbalized understanding of instructions provided today and declined a print copy of patient  instruction materials.   Telephone follow up appointment with pharmacy team member scheduled for: 2 months  [name], PharmD Clinical Pharmacist [place] [facility] [place] [phone]

## 2019-11-03 ENCOUNTER — Telehealth: Payer: Self-pay | Admitting: Family Medicine

## 2019-11-03 MED ORDER — EPINEPHRINE 0.3 MG/0.3ML IJ SOAJ: 0.3000 mg | INTRAMUSCULAR | 2 refills | Status: DC | PRN

## 2019-11-03 NOTE — Telephone Encounter (Signed)
Done

## 2019-11-06 ENCOUNTER — Encounter: Payer: Self-pay | Admitting: Family Medicine

## 2019-11-07 ENCOUNTER — Ambulatory Visit (INDEPENDENT_AMBULATORY_CARE_PROVIDER_SITE_OTHER): Payer: PPO | Admitting: Pulmonary Disease

## 2019-11-07 ENCOUNTER — Encounter: Payer: Self-pay | Admitting: Pulmonary Disease

## 2019-11-07 ENCOUNTER — Other Ambulatory Visit: Payer: Self-pay

## 2019-11-07 VITALS — BP 128/78 | HR 92 | Temp 97.6°F | Ht 62.0 in | Wt 210.4 lb

## 2019-11-07 DIAGNOSIS — G4733 Obstructive sleep apnea (adult) (pediatric): Secondary | ICD-10-CM | POA: Diagnosis not present

## 2019-11-07 DIAGNOSIS — Z23 Encounter for immunization: Secondary | ICD-10-CM | POA: Diagnosis not present

## 2019-11-07 NOTE — Progress Notes (Signed)
Christina [name]    [id number]    [birth year]  Primary Care Physician:Fry, Ishmael Holter, MD  Referring Physician: Laurey Morale, MD [place],   [id number]  Chief complaint:   Patient being seen for nonrestorative sleep, multiple awakenings  HPI:  Patient with insomnia Uses multiple medications  Has chronic pain for which he is on multiple medications that may be contributing to daytime sleepiness  Sleep maintenance insomnia mostly  She is on Restoril, uses melatonin, muscle relaxant  Chronic back issues with fusion, history of spinal meningitis in the past Only able to ambulate about 20 steps and she has to sit afterwards  History of snoring, no witnessed apneas Usually able to get to sleep okay but wakes up multiple times  She does feel thirsty a lot at times and drinks a lot of fluids  She does take daytime naps  Bedtime is usually 3 AM to 5 AM, may wake up at least 3 times during the night  History of hypertension, diabetes, allergy/sinus problems  Does get dryness of her mouth when she wakes up  Reformed smoker quit 2009  Outpatient Encounter Medications as of 11/07/2019  Medication Sig  . aspirin 81 MG EC tablet Take 81 mg by mouth daily.    [name] atorvastatin (LIPITOR) 10 MG tablet TAKE 1 TABLET BY MOUTH EVERY DAY  . cephALEXin (KEFLEX) 500 MG capsule TAKE 1 CAPSULE BY MOUTH THREE TIMES A DAY  . cholecalciferol (VITAMIN D) 1000 UNITS tablet Take 1,000 Units by mouth daily.  . Cinnamon 500 MG TABS Take 2 tablets by mouth daily.  . clobetasol (TEMOVATE) 0.05 % external solution Apply 1 application topically 2 (two) times daily.  . [employer] ULTRA THIN LANCETS MISC 1 strip by Does not apply route as directed.  . Cyanocobalamin (VITAMIN B 12 PO) Take 1,000 Units by mouth daily.  . cyclobenzaprine (FLEXERIL) 10 MG tablet TAKE 1 TABLET BY MOUTH THREE TIMES A DAY AS NEEDED FOR MUSCLE SPASMS  . desonide (DESOWEN) 0.05 % cream Apply topically 2  (two) times daily.    . diclofenac (VOLTAREN) 75 MG EC tablet TAKE 1 TABLET BY MOUTH TWICE A DAY  . diltiazem (CARDIZEM CD) 360 MG 24 hr capsule Take 1 capsule (360 mg total) by mouth daily.  . diphenhydrAMINE (BENADRYL) 25 mg capsule Take 25 mg by mouth as needed.    . fenofibrate 160 MG tablet TAKE 1 TABLET BY MOUTH DAILY  . gabapentin (NEURONTIN) 300 MG capsule TAKE 2 CAPSULES (600 MG TOTAL) BY MOUTH 3 (THREE) TIMES DAILY.  [name] glucose blood (TRUETEST TEST) test strip Use as instructed  . hydrochlorothiazide (HYDRODIURIL) 25 MG tablet TAKE 1 TABLET BY MOUTH EVERY DAY  . HYDROmorphone (DILAUDID) 4 MG tablet Take 1 tablet (4 mg total) by mouth every 6 (six) hours as needed for severe pain.  [name] levothyroxine (SYNTHROID) 50 MCG tablet TAKE 1 TABLET (50 MCG TOTAL) BY MOUTH DAILY.  . metFORMIN (GLUCOPHAGE-XR) 500 MG 24 hr tablet TAKE 1 TABLET BY MOUTH TWICE A DAY WITH MEAL  . metoprolol succinate (TOPROL-XL) 100 MG 24 hr tablet TAKE 1 TABLET BY MOUTH DAILY. TAKE WITH OR IMMEDIATELY FOLLOWING A MEAL  . mupirocin ointment (BACTROBAN) 2 % Apply topically 3 (three) times daily.  [name] omeprazole (PRILOSEC) 20 MG capsule TAKE 1 CAPSULE BY MOUTH TWICE A DAY  . potassium chloride (KLOR-CON M10) 10 MEQ tablet TAKE 1 TABLET BY MOUTH EVERY DAY  .  promethazine (PHENERGAN) 25 MG tablet Take 1 tablet (25 mg total) by mouth every 6 (six) hours as needed for nausea.  . Semaglutide,0.25 or 0.5MG /DOS, (OZEMPIC, 0.25 OR 0.5 MG/DOSE,) 2 MG/1.5ML SOPN Inject 0.375 mLs (0.5 mg total) into the skin once a week.  . sulfamethoxazole-trimethoprim (BACTRIM DS) 800-160 MG tablet Take 1 tablet by mouth 2 (two) times daily.  . temazepam (RESTORIL) 30 MG capsule Take 1 capsule (30 mg total) by mouth at bedtime as needed for sleep.  . traMADol (ULTRAM) 50 MG tablet TAKE 2 TABLETS BY MOUTH 3 TIMES DAILY AS NEEDED FOR MODERATE PAIN  . triamcinolone cream (KENALOG) 0.1 % Apply 1 application topically 2 (two) times daily.  [name] venlafaxine XR  (EFFEXOR-XR) 150 MG 24 hr capsule TAKE 1 CAPSULE (150 MG TOTAL) BY MOUTH DAILY WITH BREAKFAST.  [name] vitamin B-12 (CYANOCOBALAMIN) 1000 MCG tablet Take 1,000 mcg by mouth daily. 2500 mcg once daily  . vitamin E (VITAMIN E) 180 MG (400 UNITS) capsule Take 400 Units by mouth daily.  . [DISCONTINUED] diltiazem (CARDIZEM CD) 240 MG 24 hr capsule TAKE 1 CAPSULE BY MOUTH EVERY DAY  . [DISCONTINUED] sertraline (ZOLOFT) 100 MG tablet TAKE 1 AND 1/2 TABLETS BY MOUTH EVERY DAY  . [DISCONTINUED] sitaGLIPtin (JANUVIA) 100 MG tablet Take 1 tablet (100 mg total) by mouth daily.  [name] EPINEPHrine 0.3 mg/0.3 mL IJ SOAJ injection Inject 0.3 mg into the muscle as needed for anaphylaxis. (Patient not taking: Reported on 11/07/2019)  . [DISCONTINUED] potassium chloride (KLOR-CON 10) 10 MEQ CR tablet Take 1 tablet (10 mEq total) by mouth daily.   No facility-administered encounter medications on file as of 11/07/2019.    Allergies as of 11/07/2019 - Review Complete 11/07/2019  Allergen Reaction Noted  . Albuterol  09/11/2006  . Antihistamines, loratadine-type  05/29/2014  . Bee venom  05/29/2014  . Clindamycin/lincomycin  02/21/2014  . Codeine  09/11/2006  . Doxycycline  09/11/2006  . Lisinopril  05/02/2009  . Metronidazole  05/10/2009  . Ondansetron hcl  05/10/2009  . Oxycodone-acetaminophen  09/11/2006  . Tylox [oxycodone-acetaminophen]  10/26/2012  . Vancomycin  05/10/2009  . Benadryl [diphenhydramine] Nausea And Vomiting 08/29/2019  . Lidocaine Rash 11/01/2019    Past Medical History:  Diagnosis Date  . Allergy   . Angioedema   . Asthma   . Blood transfusion without reported diagnosis    with both vaginal deliveries bleed out and had blood  . DDD (degenerative disc disease), lumbar   . Diabetes mellitus    sees Dr. Cruzita Lederer   . Eczema   . GERD (gastroesophageal reflux disease)   . Hx of colonic polyps   . Hyperlipidemia   . Hypertension   . Interstitial cystitis    per Dr. Risa Grill  . Low back  pain   . Meningitis   . Seizures ([place])    seizures with brain tumor-no sz since 1984  . Thyroid disease     Past Surgical History:  Procedure Laterality Date  . ABDOMINAL HYSTERECTOMY    . BRAIN TUMOR EXCISION     benign 1984  . CHOLECYSTECTOMY    . COLONOSCOPY  05/02/2015   per Dr. Silverio Decamp, internal hemorrhoids but no polyps, repeat in 10 yrs   . cystic mass  4.4.11   remval from abdomen per Dr. Kendell Bane at [facility]  . POLYPECTOMY    . SVD X 2    . TONSILLECTOMY    . TUBAL LIGATION     bilateral  Family History  Problem Relation Age of Onset  . Hypertension Other        family hx  . Stroke Mother   . Rheum arthritis Mother   . COPD Mother   . Stroke Father   . Colon cancer Father   . Colon polyps Neg Hx     Social History   Socioeconomic History  . Marital status: Married    Spouse name: Not on file  . Number of children: Not on file  . Years of education: Not on file  . Highest education level: Not on file  Occupational History  . Not on file  Tobacco Use  . Smoking status: Former Research scientist (life sciences)  . Smokeless tobacco: Never Used  Substance and Sexual Activity  . Alcohol use: No    Alcohol/week: 0.0 standard drinks  . Drug use: No  . Sexual activity: Not on file  Other Topics Concern  . Not on file  Social History Narrative  . Not on file   Social Determinants of Health   Financial Resource Strain: Medium Risk  . Difficulty of Paying Living Expenses: Somewhat hard  Food Insecurity:   . Worried About Charity fundraiser in the Last Year: Not on file  . Ran Out of Food in the Last Year: Not on file  Transportation Needs: No Transportation Needs  . Lack of Transportation (Medical): No  . Lack of Transportation (Non-Medical): No  Physical Activity:   . Days of Exercise per Week: Not on file  . Minutes of Exercise per Session: Not on file  Stress:   . Feeling of Stress : Not on file  Social Connections:   . Frequency of Communication with  Friends and Family: Not on file  . Frequency of Social Gatherings with Friends and Family: Not on file  . Attends Religious Services: Not on file  . Active Member of Clubs or Organizations: Not on file  . Attends Archivist Meetings: Not on file  . Marital Status: Not on file  Intimate Partner Violence:   . Fear of Current or Ex-Partner: Not on file  . Emotionally Abused: Not on file  . Physically Abused: Not on file  . Sexually Abused: Not on file    Review of Systems  Constitutional: Positive for fatigue.  Musculoskeletal: Positive for back pain.  Psychiatric/Behavioral: Positive for sleep disturbance.  All other systems reviewed and are negative.   Vitals:   11/07/19 1149  BP: 128/78  Pulse: 92  Temp: 97.6 F (36.4 C)  SpO2: 97%     Physical Exam Constitutional:      Appearance: She is obese.  HENT:     Head: Normocephalic.     Nose: No congestion or rhinorrhea.     Mouth/Throat:     Mouth: Mucous membranes are moist.     Pharynx: No oropharyngeal exudate.  Cardiovascular:     Rate and Rhythm: Normal rate and regular rhythm.     Pulses: Normal pulses.     Heart sounds: Normal heart sounds. No murmur heard.  No friction rub.  Pulmonary:     Effort: Pulmonary effort is normal. No respiratory distress.     Breath sounds: Normal breath sounds. No stridor. No wheezing or rhonchi.  Musculoskeletal:     Cervical back: No rigidity or tenderness.  Neurological:     Mental Status: She is alert.  Psychiatric:        Mood and Affect: Mood normal.    Results of the  Epworth flowsheet 11/07/2019  Sitting and reading 2  Watching TV 2  Sitting, inactive in a public place (e.g. a theatre or a meeting) 0  As a passenger in a car for an hour without a break 0  Lying down to rest in the afternoon when circumstances permit 2  Sitting and talking to someone 1  Sitting quietly after a lunch without alcohol 1  In a car, while stopped for a few minutes in traffic 0    Total score 8   Assessment:  Moderate probability of significant obstructive sleep apnea  Nonrestrictive sleep  Obesity  Chronic back pain  Pathophysiology of sleep disordered breathing discussed Treatment options for sleep disordered breathing discussed  Plan/Recommendations: Risk of not treating sleep disordered breathing discussed  We will schedule patient for home sleep study  Follow-up in about 3 months  Flu shot today   Sherrilyn Rist MD [facility] 11/07/2019, 12:25 PM  CC: Laurey Morale, MD

## 2019-11-07 NOTE — Telephone Encounter (Signed)
EPINEPHrine 0.3 mg/0.3 mL IJ SOAJ injection  [facility] - [place], [place] - [address] Phone:  [phone]  Fax:  [phone]       Christina [name] is taking care of the Semaglutide,0.25 or 0.5MG /DOS, (OZEMPIC, 0.25 OR 0.5 MG/DOSE,) 2 MG/1.5ML SOPN  The manufacture of this medication won't send directly to the patient. They send here and the pharmacist have to take to [facility] for [facility] to deliver to the patient.

## 2019-11-07 NOTE — Patient Instructions (Signed)
Sleep maintenance insomnia Snoring Daytime sleepiness  -Schedule you for home sleep study -Initiate CPAP therapy if study is positive  Follow-up in 3 months  Flu shot today  Call with significant concerns Sleep Apnea Sleep apnea affects breathing during sleep. It causes breathing to stop for a short time or to become shallow. It can also increase the risk of:  Heart attack.  Stroke.  Being very overweight (obese).  Diabetes.  Heart failure.  Irregular heartbeat. The goal of treatment is to help you breathe normally again. What are the causes? There are three kinds of sleep apnea:  Obstructive sleep apnea. This is caused by a blocked or collapsed airway.  Central sleep apnea. This happens when the brain does not send the right signals to the muscles that control breathing.  Mixed sleep apnea. This is a combination of obstructive and central sleep apnea. The most common cause of this condition is a collapsed or blocked airway. This can happen if:  Your throat muscles are too relaxed.  Your tongue and tonsils are too large.  You are overweight.  Your airway is too small. What increases the risk?  Being overweight.  Smoking.  Having a small airway.  Being older.  Being female.  Drinking alcohol.  Taking medicines to calm yourself (sedatives or tranquilizers).  Having family members with the condition. What are the signs or symptoms?  Trouble staying asleep.  Being sleepy or tired during the day.  Getting angry a lot.  Loud snoring.  Headaches in the morning.  Not being able to focus your mind (concentrate).  Forgetting things.  Less interest in sex.  Mood swings.  Personality changes.  Feelings of sadness (depression).  Waking up a lot during the night to pee (urinate).  Dry mouth.  Sore throat. How is this diagnosed?  Your medical history.  A physical exam.  A test that is done when you are sleeping (sleep study). The test is  most often done in a sleep lab but may also be done at home. How is this treated?   Sleeping on your side.  Using a medicine to get rid of mucus in your nose (decongestant).  Avoiding the use of alcohol, medicines to help you relax, or certain pain medicines (narcotics).  Losing weight, if needed.  Changing your diet.  Not smoking.  Using a machine to open your airway while you sleep, such as: ? An oral appliance. This is a mouthpiece that shifts your lower jaw forward. ? A CPAP device. This device blows air through a mask when you breathe out (exhale). ? An EPAP device. This has valves that you put in each nostril. ? A BPAP device. This device blows air through a mask when you breathe in (inhale) and breathe out.  Having surgery if other treatments do not work. It is important to get treatment for sleep apnea. Without treatment, it can lead to:  High blood pressure.  Coronary artery disease.  In men, not being able to have an erection (impotence).  Reduced thinking ability. Follow these instructions at home: Lifestyle  Make changes that your doctor recommends.  Eat a healthy diet.  Lose weight if needed.  Avoid alcohol, medicines to help you relax, and some pain medicines.  Do not use any products that contain nicotine or tobacco, such as cigarettes, e-cigarettes, and chewing tobacco. If you need help quitting, ask your doctor. General instructions  Take over-the-counter and prescription medicines only as told by your doctor.  If you  were given a machine to use while you sleep, use it only as told by your doctor.  If you are having surgery, make sure to tell your doctor you have sleep apnea. You may need to bring your device with you.  Keep all follow-up visits as told by your doctor. This is important. Contact a doctor if:  The machine that you were given to use during sleep bothers you or does not seem to be working.  You do not get better.  You get  worse. Get help right away if:  Your chest hurts.  You have trouble breathing in enough air.  You have an uncomfortable feeling in your back, arms, or stomach.  You have trouble talking.  One side of your body feels weak.  A part of your face is hanging down. These symptoms may be an emergency. Do not wait to see if the symptoms will go away. Get medical help right away. Call your local emergency services (911 in the [place] Do not drive yourself to the hospital. Summary  This condition affects breathing during sleep.  The most common cause is a collapsed or blocked airway.  The goal of treatment is to help you breathe normally while you sleep. This information is not intended to replace advice given to you by your health care provider. Make sure you discuss any questions you have with your health care provider. Document Revised: 10/30/2017 Document Reviewed: 09/08/2017 Elsevier Patient Education  [place].

## 2019-11-08 MED ORDER — EPINEPHRINE 0.3 MG/0.3ML IJ SOAJ: 0.3000 mg | INTRAMUSCULAR | 2 refills | Status: DC | PRN

## 2019-11-08 NOTE — Telephone Encounter (Signed)
I sent the rx for the EpiPen to [facility] , hopefully it will be cheaper there

## 2019-11-14 ENCOUNTER — Telehealth: Payer: Self-pay

## 2019-11-14 DIAGNOSIS — E119 Type 2 diabetes mellitus without complications: Secondary | ICD-10-CM

## 2019-11-14 NOTE — Telephone Encounter (Signed)
Patient is calling to see if she can come into the office to get labs done she is thinking she needs to get a A1C done    Please advise

## 2019-11-15 NOTE — Telephone Encounter (Signed)
Patient called, left VM to return the call to the office to discuss lab work needed. Nothing noted by Dr. Sarajane Jews to have this repeated, last done on 05/02/19.

## 2019-11-15 NOTE — Telephone Encounter (Signed)
Patient return call, I asked why is she requesting another A1C when it was done in April. She says the [facility] here at the office recommended it to be rechecked since starting new medication the last time she was seen by Dr. Sarajane Jews just to see if the diabetes is responding to the medication. She says she has the appointment on 12/16/19, but will need the order entered. I advised I will send to Dr. Sarajane Jews and call back if she needs to see him as well.

## 2019-11-16 NOTE — Addendum Note (Signed)
Addended by: Alysia Penna A on: 11/16/2019 04:21 PM   Modules accepted: Orders

## 2019-11-16 NOTE — Telephone Encounter (Signed)
Noted  

## 2019-11-16 NOTE — Telephone Encounter (Signed)
I ordered the A1c

## 2019-11-18 ENCOUNTER — Telehealth: Payer: Self-pay | Admitting: Pharmacist

## 2019-11-18 DIAGNOSIS — E1165 Type 2 diabetes mellitus with hyperglycemia: Secondary | ICD-10-CM

## 2019-11-18 DIAGNOSIS — I10 Essential (primary) hypertension: Secondary | ICD-10-CM

## 2019-11-18 NOTE — Chronic Care Management (AMB) (Addendum)
Chronic Care Management Pharmacy Assistant   Name: Christina [name]  MRN: [id number] DOB: 04/26/1965  Reason for Encounter: Medication Review  Patient Questions:  1.  Have you seen any other providers since your last visit? No  2.  Any changes in your medicines or health? No   PCP : Laurey Morale, MD  Allergies:   Allergies  Allergen Reactions   Albuterol    Antihistamines, Loratadine-Type    Bee Venom    Clindamycin/Lincomycin    Codeine    Doxycycline    Lisinopril     REACTION: facial swelling   Metronidazole     REACTION: rash   Ondansetron Hcl     REACTION: rash   Oxycodone-Acetaminophen    Tylox [Oxycodone-Acetaminophen]    Vancomycin     REACTION: rash   Benadryl [Diphenhydramine] Nausea And Vomiting   Lidocaine Rash    Medications: Outpatient Encounter Medications as of 11/18/2019  Medication Sig   aspirin 81 MG EC tablet Take 81 mg by mouth daily.     atorvastatin (LIPITOR) 10 MG tablet TAKE 1 TABLET BY MOUTH EVERY DAY   cephALEXin (KEFLEX) 500 MG capsule TAKE 1 CAPSULE BY MOUTH THREE TIMES A DAY   cholecalciferol (VITAMIN D) 1000 UNITS tablet Take 1,000 Units by mouth daily.   Cinnamon 500 MG TABS Take 2 tablets by mouth daily.   clobetasol (TEMOVATE) 0.05 % external solution Apply 1 application topically 2 (two) times daily.   [facility] ULTRA THIN LANCETS MISC 1 strip by Does not apply route as directed.   Cyanocobalamin (VITAMIN B 12 PO) Take 1,000 Units by mouth daily.   cyclobenzaprine (FLEXERIL) 10 MG tablet TAKE 1 TABLET BY MOUTH THREE TIMES A DAY AS NEEDED FOR MUSCLE SPASMS   desonide (DESOWEN) 0.05 % cream Apply topically 2 (two) times daily.     diclofenac (VOLTAREN) 75 MG EC tablet TAKE 1 TABLET BY MOUTH TWICE A DAY   diltiazem (CARDIZEM CD) 360 MG 24 hr capsule Take 1 capsule (360 mg total) by mouth daily.   diphenhydrAMINE (BENADRYL) 25 mg capsule Take 25 mg by mouth as needed.     EPINEPHrine 0.3 mg/0.3 mL IJ SOAJ  injection Inject 0.3 mg into the muscle as needed for anaphylaxis.   fenofibrate 160 MG tablet TAKE 1 TABLET BY MOUTH DAILY   gabapentin (NEURONTIN) 300 MG capsule TAKE 2 CAPSULES (600 MG TOTAL) BY MOUTH 3 (THREE) TIMES DAILY.   glucose blood (TRUETEST TEST) test strip Use as instructed   hydrochlorothiazide (HYDRODIURIL) 25 MG tablet TAKE 1 TABLET BY MOUTH EVERY DAY   HYDROmorphone (DILAUDID) 4 MG tablet Take 1 tablet (4 mg total) by mouth every 6 (six) hours as needed for severe pain.   levothyroxine (SYNTHROID) 50 MCG tablet TAKE 1 TABLET (50 MCG TOTAL) BY MOUTH DAILY.   metFORMIN (GLUCOPHAGE-XR) 500 MG 24 hr tablet TAKE 1 TABLET BY MOUTH TWICE A DAY WITH MEAL   metoprolol succinate (TOPROL-XL) 100 MG 24 hr tablet TAKE 1 TABLET BY MOUTH DAILY. TAKE WITH OR IMMEDIATELY FOLLOWING A MEAL   mupirocin ointment (BACTROBAN) 2 % Apply topically 3 (three) times daily.   omeprazole (PRILOSEC) 20 MG capsule TAKE 1 CAPSULE BY MOUTH TWICE A DAY   potassium chloride (KLOR-CON M10) 10 MEQ tablet TAKE 1 TABLET BY MOUTH EVERY DAY   promethazine (PHENERGAN) 25 MG tablet Take 1 tablet (25 mg total) by mouth every 6 (six) hours as needed for nausea.   Semaglutide,0.25 or 0.5MG /DOS, (OZEMPIC, 0.25  OR 0.5 MG/DOSE,) 2 MG/1.5ML SOPN Inject 0.375 mLs (0.5 mg total) into the skin once a week.   sulfamethoxazole-trimethoprim (BACTRIM DS) 800-160 MG tablet Take 1 tablet by mouth 2 (two) times daily.   temazepam (RESTORIL) 30 MG capsule Take 1 capsule (30 mg total) by mouth at bedtime as needed for sleep.   traMADol (ULTRAM) 50 MG tablet TAKE 2 TABLETS BY MOUTH 3 TIMES DAILY AS NEEDED FOR MODERATE PAIN   triamcinolone cream (KENALOG) 0.1 % Apply 1 application topically 2 (two) times daily.   venlafaxine XR (EFFEXOR-XR) 150 MG 24 hr capsule TAKE 1 CAPSULE (150 MG TOTAL) BY MOUTH DAILY WITH BREAKFAST.   vitamin B-12 (CYANOCOBALAMIN) 1000 MCG tablet Take 1,000 mcg by mouth daily. 2500 mcg once daily    vitamin E (VITAMIN E) 180 MG (400 UNITS) capsule Take 400 Units by mouth daily.   [DISCONTINUED] potassium chloride (KLOR-CON 10) 10 MEQ CR tablet Take 1 tablet (10 mEq total) by mouth daily.   No facility-administered encounter medications on file as of 11/18/2019.    Current Diagnosis: Patient Active Problem List   Diagnosis Date Noted   Hyperlipidemia    Depression with anxiety 07/13/2019   S/P Achilles tendon repair 02/18/2018   Psoriasis 10/29/2016   Morbid obesity ([place]) 01/04/2016   Hypothyroidism 04/05/2014   Type 2 diabetes mellitus, uncontrolled ([place]) 03/06/2014   NECK PAIN 11/28/2009   BRACHIAL NEURITIS OR RADICULITIS NOS 11/28/2009   VITAMIN B12 DEFICIENCY 09/14/2009   HYPERTRIGLYCERIDEMIA 09/14/2009   Diabetic neuropathy ([place]) 09/11/2009   ECZEMA 06/07/2009   Diabetes mellitus without complication ([place]) 58/09/9831   ALLERGIC URTICARIA 05/02/2009   ANGIOEDEMA 05/02/2009   SKIN LESION 11/27/2008   Anxiety state 07/26/2008   ABDOMINAL PAIN, GENERALIZED 04/03/2008   [place] DISEASE, LUMBAR 06/22/2007   ARTHRITIS 06/11/2007   LOW BACK PAIN 12/28/2006   Essential hypertension 09/11/2006   Asthma 09/11/2006   GERD 09/11/2006   History of colonic polyps 09/11/2006    Goals Addressed   None    Reviewed chart for medication changes ahead of medication coordination call.  No OVs, Consults, or hospital visits since last care coordination call/Pharmacist visit. No medication changes indicated.  BP Readings from Last 3 Encounters:  11/07/19 128/78  08/03/19 140/80  07/13/19 (!) 150/70    Lab Results  Component Value Date   HGBA1C 9.3 (H) 05/02/2019     Patient obtains medications through Vials  90 Days   Last adherence delivery included:  Potassium chloride (KLOR-CON 10) 10 MEQ CR   Fenofibrate 160 MG tablet  Venlafaxine XR 150 mg 24hr capsule  Patient is due for next adherence delivery on: 11-22-2019. Called patient and  reviewed medications and coordinated delivery. This delivery to include:  Diclofenac (VOLTAREN) 75 MG EC take one tablet by mouth twice a day  Gabapentin (NEURONTIN) 300 MG take 2 capsules (600 mg total) by mouth 3 times daily  Metformin (GLUCOPHAGE-XR) 500 MG 24 hr take l tablet by mouth twice a day  Patient declined the following medications due to supply on hand:  levothyroxine (SYNTHROID) 50 MCG take 1 tablet by mouth daily   sulfamethoxazole-trimethoprim (BACTRIM DS) 800-160 MG take 1 tablet by mouth 2 (two) times daily.  atorvastatin (LIPITOR) 10 MG take 1 tablet by mouth daily.  diltiazem (CARDIZEM CD) 360 MG 24 hr Take 1 capsule (360 mg total) by mouth daily.  metoprolol succinate (TOPROL-XL) 100 MG 24 hr take 1 tablet by mouth immediately following meals  omeprazole (PRILOSEC) 20 MG capsule  take 1 capsule by mouth twice a day   cyclobenzaprine (FLEXERIL) 10 MG tablet take 1 tablet by mouth three times a day as needed for muscle spasms  temazepam (RESTORIL) 30 MG capsule Take 1 capsule (30 mg total) by mouth at bedtime as needed for sleep.  Tramadol (Ultram) 50 MG tablet take two tablets by mouth 3 times daily as needed.  Potassium chloride (KLOR-CON 10) 10 MEQ CR   Fenofibrate 160 MG tablet  Venlafaxine XR 150 mg 24hr capsule  Patient needs refills for :  Diclofenac (VOLTAREN) 75 MG EC take one tablet by mouth twice a day  Gabapentin (NEURONTIN) 300 MG take 2 capsules (600 mg total) by mouth 3 times daily  Metformin (GLUCOPHAGE-XR) 500 MG 24 hr. take l tablet by mouth twice a day  Confirmed delivery date of 11-22-2019, advised patient that pharmacy will contact them the morning of delivery.  Follow-Up:  Coordination of [facility]   She also stated that she needs to renew her patient assistance with her Ozempic 0.5 mg.   [name], [place] [phone]  Patient is not due for Ozempic until November 12.  Will plan to renew once able to with [employer] on October 30th.  [name], PharmD Clinical Pharmacist [place] [facility] [place]

## 2019-11-19 ENCOUNTER — Telehealth: Payer: Self-pay | Admitting: *Deleted

## 2019-11-19 MED ORDER — OZEMPIC (0.25 OR 0.5 MG/DOSE) 2 MG/1.5ML ~~LOC~~ SOPN: 0.5000 mg | PEN_INJECTOR | SUBCUTANEOUS | 0 refills | Status: DC

## 2019-11-19 NOTE — Telephone Encounter (Signed)
-----   Message from Viona Gilmore, [facility] sent at 11/08/2019  8:02 AM EDT ----- Regarding: Ozempic sample Hi Apolonio Schneiders!  Can you please put in an Ozempic 0.5 mg sample for Ms. Angela Burke? Her PCP is Alysia Penna. I have to run to deliver this so my teams isn't pulled up and I figured it would be easier this way. The lot is [id number] and exp. 08/2021. The sample is just one 2 mg/1.5 mL pen.  Let me know if you need more info!  Thanks, Maddie

## 2019-11-22 ENCOUNTER — Telehealth: Payer: Self-pay

## 2019-11-22 ENCOUNTER — Other Ambulatory Visit: Payer: Self-pay | Admitting: Family Medicine

## 2019-11-22 MED ORDER — METFORMIN HCL ER 500 MG PO TB24: ORAL_TABLET | ORAL | 1 refills | Status: DC

## 2019-11-22 MED ORDER — DICLOFENAC SODIUM 75 MG PO TBEC: 75.0000 mg | DELAYED_RELEASE_TABLET | Freq: Two times a day (BID) | ORAL | 1 refills | Status: DC

## 2019-11-22 MED ORDER — GABAPENTIN 300 MG PO CAPS: ORAL_CAPSULE | ORAL | 1 refills | Status: DC

## 2019-11-22 NOTE — Telephone Encounter (Signed)
Done

## 2019-11-22 NOTE — Telephone Encounter (Signed)
-----   Message from Viona Gilmore, [facility] sent at 11/22/2019  8:41 AM EDT ----- Regarding: Refills Hi,  Can you please send refills for diclofenac, gabapentin and metformin to be sent to [facility]?  Thank you, Maddie

## 2019-11-23 ENCOUNTER — Telehealth: Payer: Self-pay

## 2019-11-23 NOTE — Telephone Encounter (Signed)
Pt called back requesting that Maddie take her medication to [facility] so that they can deliver it to her in [place].  Pt stated that they can call her back on her home number.

## 2019-11-23 NOTE — Telephone Encounter (Signed)
Patient called, left VM on both phones that Oxempic is here and ready for pickup. Placed in refrigerator with her name.

## 2019-12-14 ENCOUNTER — Telehealth: Payer: Self-pay | Admitting: Pharmacist

## 2019-12-14 NOTE — Chronic Care Management (AMB) (Signed)
Chronic Care Management Pharmacy Assistant   Name: Christina [name]  MRN: [id number] DOB: 01/31/1965  Reason for Encounter: Medication Review  Patient Questions:  1.  Have you seen any other providers since your last visit? No  2.  Any changes in your medicines or health? No   PCP : Laurey Morale, MD  Allergies:   Allergies  Allergen Reactions  . Albuterol   . Antihistamines, Loratadine-Type   . Bee Venom   . Clindamycin/Lincomycin   . Codeine   . Doxycycline   . Lisinopril     REACTION: facial swelling  . Metronidazole     REACTION: rash  . Ondansetron Hcl     REACTION: rash  . Oxycodone-Acetaminophen   . Tylox [Oxycodone-Acetaminophen]   . Vancomycin     REACTION: rash  . Benadryl [Diphenhydramine] Nausea And Vomiting  . Lidocaine Rash    Medications: Outpatient Encounter Medications as of 12/14/2019  Medication Sig  . aspirin 81 MG EC tablet Take 81 mg by mouth daily.    [name] atorvastatin (LIPITOR) 10 MG tablet TAKE 1 TABLET BY MOUTH EVERY DAY  . cephALEXin (KEFLEX) 500 MG capsule TAKE 1 CAPSULE BY MOUTH THREE TIMES A DAY  . cholecalciferol (VITAMIN D) 1000 UNITS tablet Take 1,000 Units by mouth daily.  . Cinnamon 500 MG TABS Take 2 tablets by mouth daily.  . clobetasol (TEMOVATE) 0.05 % external solution Apply 1 application topically 2 (two) times daily.  . [facility] ULTRA THIN LANCETS MISC 1 strip by Does not apply route as directed.  . Cyanocobalamin (VITAMIN B 12 PO) Take 1,000 Units by mouth daily.  . cyclobenzaprine (FLEXERIL) 10 MG tablet TAKE 1 TABLET BY MOUTH THREE TIMES A DAY AS NEEDED FOR MUSCLE SPASMS  . desonide (DESOWEN) 0.05 % cream Apply topically 2 (two) times daily.    . diclofenac (VOLTAREN) 75 MG EC tablet Take 1 tablet (75 mg total) by mouth 2 (two) times daily.  [name] diltiazem (CARDIZEM CD) 360 MG 24 hr capsule Take 1 capsule (360 mg total) by mouth daily.  . diphenhydrAMINE (BENADRYL) 25 mg capsule Take 25 mg by mouth as needed.    [name] EPINEPHrine 0.3  mg/0.3 mL IJ SOAJ injection Inject 0.3 mg into the muscle as needed for anaphylaxis.  . fenofibrate 160 MG tablet TAKE 1 TABLET BY MOUTH DAILY  . gabapentin (NEURONTIN) 300 MG capsule TAKE 2 CAPSULES (600 MG TOTAL) BY MOUTH 3 (THREE) TIMES DAILY.  [name] glucose blood (TRUETEST TEST) test strip Use as instructed  . hydrochlorothiazide (HYDRODIURIL) 25 MG tablet TAKE 1 TABLET BY MOUTH EVERY DAY  . HYDROmorphone (DILAUDID) 4 MG tablet Take 1 tablet (4 mg total) by mouth every 6 (six) hours as needed for severe pain.  [name] levothyroxine (SYNTHROID) 50 MCG tablet TAKE 1 TABLET (50 MCG TOTAL) BY MOUTH DAILY.  . metFORMIN (GLUCOPHAGE-XR) 500 MG 24 hr tablet TAKE 1 TABLET BY MOUTH TWICE A DAY WITH MEAL  . metoprolol succinate (TOPROL-XL) 100 MG 24 hr tablet TAKE 1 TABLET BY MOUTH DAILY. TAKE WITH OR IMMEDIATELY FOLLOWING A MEAL  . mupirocin ointment (BACTROBAN) 2 % Apply topically 3 (three) times daily.  [name] omeprazole (PRILOSEC) 20 MG capsule TAKE 1 CAPSULE BY MOUTH TWICE A DAY  . potassium chloride (KLOR-CON M10) 10 MEQ tablet TAKE 1 TABLET BY MOUTH EVERY DAY  . promethazine (PHENERGAN) 25 MG tablet Take 1 tablet (25 mg total) by mouth every 6 (six) hours as needed for nausea.  . Semaglutide,0.25  or 0.5MG /DOS, (OZEMPIC, 0.25 OR 0.5 MG/DOSE,) 2 MG/1.5ML SOPN Inject 0.5 mg into the skin once a week.  . sulfamethoxazole-trimethoprim (BACTRIM DS) 800-160 MG tablet Take 1 tablet by mouth 2 (two) times daily.  . temazepam (RESTORIL) 30 MG capsule Take 1 capsule (30 mg total) by mouth at bedtime as needed for sleep.  . traMADol (ULTRAM) 50 MG tablet TAKE 2 TABLETS BY MOUTH 3 TIMES DAILY AS NEEDED FOR MODERATE PAIN  . triamcinolone cream (KENALOG) 0.1 % Apply 1 application topically 2 (two) times daily.  [name] venlafaxine XR (EFFEXOR-XR) 150 MG 24 hr capsule TAKE 1 CAPSULE (150 MG TOTAL) BY MOUTH DAILY WITH BREAKFAST.  [name] vitamin B-12 (CYANOCOBALAMIN) 1000 MCG tablet Take 1,000 mcg by mouth daily. 2500 mcg once daily  .  vitamin E (VITAMIN E) 180 MG (400 UNITS) capsule Take 400 Units by mouth daily.  . [DISCONTINUED] potassium chloride (KLOR-CON 10) 10 MEQ CR tablet Take 1 tablet (10 mEq total) by mouth daily.   No facility-administered encounter medications on file as of 12/14/2019.    Current Diagnosis: Patient Active Problem List   Diagnosis Date Noted  . Hyperlipidemia   . Depression with anxiety 07/13/2019  . S/P Achilles tendon repair 02/18/2018  . Psoriasis 10/29/2016  . Morbid obesity ([place]) 01/04/2016  . Hypothyroidism 04/05/2014  . Type 2 diabetes mellitus, uncontrolled ([place]) 03/06/2014  . NECK PAIN 11/28/2009  . BRACHIAL NEURITIS OR RADICULITIS NOS 11/28/2009  . VITAMIN B12 DEFICIENCY 09/14/2009  . HYPERTRIGLYCERIDEMIA 09/14/2009  . Diabetic neuropathy ([place]) 09/11/2009  . ECZEMA 06/07/2009  . Diabetes mellitus without complication ([place]) 16/10/9602  . ALLERGIC URTICARIA 05/02/2009  . ANGIOEDEMA 05/02/2009  . SKIN LESION 11/27/2008  . Anxiety state 07/26/2008  . ABDOMINAL PAIN, GENERALIZED 04/03/2008  . [place] DISEASE, LUMBAR 06/22/2007  . ARTHRITIS 06/11/2007  . LOW BACK PAIN 12/28/2006  . Essential hypertension 09/11/2006  . Asthma 09/11/2006  . GERD 09/11/2006  . History of colonic polyps 09/11/2006    Goals Addressed   None    Reviewed chart for medication changes ahead of medication coordination call. No OVs, Consults, or hospital visits since last care coordination call/Pharmacist visit.  No medication changes indicated OR if recent visit, treatment plan here.  BP Readings from Last 3 Encounters:  11/07/19 128/78  08/03/19 140/80  07/13/19 (!) 150/70    Lab Results  Component Value Date   HGBA1C 9.3 (H) 05/02/2019     Patient obtains medications through Vials  90 Days   Last adherence delivery included:  - Diclofenac (VOLTAREN) 75 MG EC take one tablet by mouth twice a day - gabapentin (NEURONTIN) 300 MG take 2 capsules (600 mg total) by mouth 3 times  daily - Metformin (GLUCOPHAGE-XR) 500 MG 24 hr. take l tablet by mouth twice a day   Patient is due for next adherence delivery on: 12-21-2019. Called patient and reviewed medications and coordinated delivery.  This delivery to include: . Levothyroxine (SYNTHROID) 50 MCG take 1 tablet by mouth daily  . Atorvastatin (LIPITOR) 10 MG take 1 tablet by mouth daily. . Tramadol (Ultram) 50 MG tablet take two tablets by mouth 3 times daily as needed. I spoke with the patient and review medications. There are no changes in medications currently. The patient is taking the following medications:  . Levothyroxine (SYNTHROID) 50 MCG take 1 tablet by mouth daily  . sulfamethoxazole-trimethoprim (BACTRIM DS) 800-160 MG take 1 tablet by mouth 2 (two) times daily. [name] atorvastatin (LIPITOR) 10 MG take 1 tablet by  mouth daily. [name] diltiazem (CARDIZEM CD) 360 MG 24 hr Take 1 capsule (360 mg total) by mouth daily. . metoprolol succinate (TOPROL-XL) 100 MG 24 hr take 1 tablet by mouth immediately following meals . omeprazole (PRILOSEC) 20 MG capsule take 1 capsule by mouth twice a day  . cyclobenzaprine (FLEXERIL) 10 MG tablet take 1 tablet by mouth three times a day as needed for muscle spasms . temazepam (RESTORIL) 30 MG capsule Take 1 capsule (30 mg total) by mouth at bedtime as needed for sleep. . Tramadol (Ultram) 50 MG tablet take two tablets by mouth 3 times daily as needed. . Potassium chloride (KLOR-CON 10) 10 MEQ CR  . Fenofibrate 160 MG tablet . Venlafaxine XR 150 mg 24hr capsule   She does not need refills at this time. She does not need these medication at this time.  Confirmed delivery date of 12-21-2019, advised patient that pharmacy will contact them the morning of delivery.  Follow-Up:  Pharmacist Review   [name], [place] Assistant [phone]

## 2019-12-16 ENCOUNTER — Telehealth: Payer: Self-pay | Admitting: Family Medicine

## 2019-12-16 ENCOUNTER — Telehealth: Payer: Self-pay

## 2019-12-16 ENCOUNTER — Other Ambulatory Visit: Payer: PPO

## 2019-12-16 NOTE — Telephone Encounter (Signed)
Returned patients call, originally called to inform patient that Ozempic injections were here and ready to be picked up. No answer LMTCB

## 2019-12-16 NOTE — Telephone Encounter (Signed)
Called patient to inform that Ozempic injections were here and ready to be picked up. No answer LMTCB. Placed injections in the refrigerator awaiting call back from patient. Will call back later.

## 2019-12-16 NOTE — Telephone Encounter (Signed)
Pt  Is returning [place] cal  and is waiting for for a call back.

## 2019-12-30 ENCOUNTER — Telehealth: Payer: PPO

## 2020-01-09 ENCOUNTER — Telehealth: Payer: Self-pay | Admitting: Pharmacist

## 2020-01-09 NOTE — Telephone Encounter (Signed)
Patient has [employer] insurance and reports copay for Ozempic is cost prohibitive at this time.  Reviewed renewal for application process for [employer] patient assistance program. Patient meets income/out of pocket spend criteria for the program. Patient will provide proof of income, out of pocket spend report, and will sign application. Will collaborate with prescriber Dr. Sarajane Jews for the provider portion of application. Once completed, application will be submitted via Fax   Patient assistance program Fax number: [place], [facility]

## 2020-01-11 ENCOUNTER — Telehealth: Payer: Self-pay | Admitting: Pharmacist

## 2020-01-11 NOTE — Chronic Care Management (AMB) (Signed)
Chronic Care Management Pharmacy Assistant   Name: DULCINEA KINSER  MRN: [id number] DOB: 01-26-1966  Reason for Encounter: Medication Review  PCP : Laurey Morale, MD  Allergies:   Allergies  Allergen Reactions   Albuterol    Antihistamines, Loratadine-Type    Bee Venom    Clindamycin/Lincomycin    Codeine    Doxycycline    Lisinopril     REACTION: facial swelling   Metronidazole     REACTION: rash   Ondansetron Hcl     REACTION: rash   Oxycodone-Acetaminophen    Tylox [Oxycodone-Acetaminophen]    Vancomycin     REACTION: rash   Benadryl [Diphenhydramine] Nausea And Vomiting   Lidocaine Rash    Medications: Outpatient Encounter Medications as of 01/11/2020  Medication Sig   aspirin 81 MG EC tablet Take 81 mg by mouth daily.     atorvastatin (LIPITOR) 10 MG tablet TAKE 1 TABLET BY MOUTH EVERY DAY   cephALEXin (KEFLEX) 500 MG capsule TAKE 1 CAPSULE BY MOUTH THREE TIMES A DAY   cholecalciferol (VITAMIN D) 1000 UNITS tablet Take 1,000 Units by mouth daily.   Cinnamon 500 MG TABS Take 2 tablets by mouth daily.   clobetasol (TEMOVATE) 0.05 % external solution Apply 1 application topically 2 (two) times daily.   [facility] ULTRA THIN LANCETS MISC 1 strip by Does not apply route as directed.   Cyanocobalamin (VITAMIN B 12 PO) Take 1,000 Units by mouth daily.   cyclobenzaprine (FLEXERIL) 10 MG tablet TAKE 1 TABLET BY MOUTH THREE TIMES A DAY AS NEEDED FOR MUSCLE SPASMS   desonide (DESOWEN) 0.05 % cream Apply topically 2 (two) times daily.     diclofenac (VOLTAREN) 75 MG EC tablet Take 1 tablet (75 mg total) by mouth 2 (two) times daily.   diltiazem (CARDIZEM CD) 360 MG 24 hr capsule Take 1 capsule (360 mg total) by mouth daily.   diphenhydrAMINE (BENADRYL) 25 mg capsule Take 25 mg by mouth as needed.     EPINEPHrine 0.3 mg/0.3 mL IJ SOAJ injection Inject 0.3 mg into the muscle as needed for anaphylaxis.   fenofibrate 160 MG tablet TAKE 1 TABLET BY MOUTH  DAILY   gabapentin (NEURONTIN) 300 MG capsule TAKE 2 CAPSULES (600 MG TOTAL) BY MOUTH 3 (THREE) TIMES DAILY.   glucose blood (TRUETEST TEST) test strip Use as instructed   hydrochlorothiazide (HYDRODIURIL) 25 MG tablet TAKE 1 TABLET BY MOUTH EVERY DAY   HYDROmorphone (DILAUDID) 4 MG tablet Take 1 tablet (4 mg total) by mouth every 6 (six) hours as needed for severe pain.   levothyroxine (SYNTHROID) 50 MCG tablet TAKE 1 TABLET (50 MCG TOTAL) BY MOUTH DAILY.   metFORMIN (GLUCOPHAGE-XR) 500 MG 24 hr tablet TAKE 1 TABLET BY MOUTH TWICE A DAY WITH MEAL   metoprolol succinate (TOPROL-XL) 100 MG 24 hr tablet TAKE 1 TABLET BY MOUTH DAILY. TAKE WITH OR IMMEDIATELY FOLLOWING A MEAL   mupirocin ointment (BACTROBAN) 2 % Apply topically 3 (three) times daily.   omeprazole (PRILOSEC) 20 MG capsule TAKE 1 CAPSULE BY MOUTH TWICE A DAY   potassium chloride (KLOR-CON M10) 10 MEQ tablet TAKE 1 TABLET BY MOUTH EVERY DAY   promethazine (PHENERGAN) 25 MG tablet Take 1 tablet (25 mg total) by mouth every 6 (six) hours as needed for nausea.   Semaglutide,0.25 or 0.5MG /DOS, (OZEMPIC, 0.25 OR 0.5 MG/DOSE,) 2 MG/1.5ML SOPN Inject 0.5 mg into the skin once a week.   sulfamethoxazole-trimethoprim (BACTRIM DS) 800-160 MG tablet Take 1  tablet by mouth 2 (two) times daily.   temazepam (RESTORIL) 30 MG capsule Take 1 capsule (30 mg total) by mouth at bedtime as needed for sleep.   traMADol (ULTRAM) 50 MG tablet TAKE 2 TABLETS BY MOUTH 3 TIMES DAILY AS NEEDED FOR MODERATE PAIN   triamcinolone cream (KENALOG) 0.1 % Apply 1 application topically 2 (two) times daily.   venlafaxine XR (EFFEXOR-XR) 150 MG 24 hr capsule TAKE 1 CAPSULE (150 MG TOTAL) BY MOUTH DAILY WITH BREAKFAST.   vitamin B-12 (CYANOCOBALAMIN) 1000 MCG tablet Take 1,000 mcg by mouth daily. 2500 mcg once daily   vitamin E (VITAMIN E) 180 MG (400 UNITS) capsule Take 400 Units by mouth daily.   [DISCONTINUED] potassium chloride (KLOR-CON 10) 10 MEQ CR  tablet Take 1 tablet (10 mEq total) by mouth daily.   No facility-administered encounter medications on file as of 01/11/2020.    Current Diagnosis: Patient Active Problem List   Diagnosis Date Noted   Hyperlipidemia    Depression with anxiety 07/13/2019   S/P Achilles tendon repair 02/18/2018   Psoriasis 10/29/2016   Morbid obesity ([place]) 01/04/2016   Hypothyroidism 04/05/2014   Type 2 diabetes mellitus, uncontrolled ([place]) 03/06/2014   NECK PAIN 11/28/2009   BRACHIAL NEURITIS OR RADICULITIS NOS 11/28/2009   VITAMIN B12 DEFICIENCY 09/14/2009   HYPERTRIGLYCERIDEMIA 09/14/2009   Diabetic neuropathy ([place]) 09/11/2009   ECZEMA 06/07/2009   Diabetes mellitus without complication ([place]) 62/13/0865   ALLERGIC URTICARIA 05/02/2009   ANGIOEDEMA 05/02/2009   SKIN LESION 11/27/2008   Anxiety state 07/26/2008   ABDOMINAL PAIN, GENERALIZED 04/03/2008   [place] DISEASE, LUMBAR 06/22/2007   ARTHRITIS 06/11/2007   LOW BACK PAIN 12/28/2006   Essential hypertension 09/11/2006   Asthma 09/11/2006   GERD 09/11/2006   History of colonic polyps 09/11/2006    Goals Addressed   None    Reviewed chart for medication changes ahead of medication coordination call. No OVs, Consults, or hospital visits since last care coordination call/Pharmacist visit.  No medication changes indicated.  BP Readings from Last 3 Encounters:  11/07/19 128/78  08/03/19 140/80  07/13/19 (!) 150/70    Lab Results  Component Value Date   HGBA1C 9.3 (H) 05/02/2019     Patient obtains medications through Vials  90 Days   Last adherence delivery included:   Levothyroxine (SYNTHROID) 50 MCG take 1 tablet by mouth daily              Atorvastatin (LIPITOR) 10 MG take 1 tablet by mouth daily.  Tramadol (Ultram) 50 MG tablet take two tablets by mouth 3 times daily as needed.  I spoke with the patient and review medications. There are no changes in medications currently. Patient declined the  following medications  due to PRN and supply on hand. The patient is taking the following medications: o Levothyroxine (SYNTHROID) 50 MCG take 1 tablet by mouth daily             o Sulfamethoxazole-trimethoprim (BACTRIM DS) 800-160 MG take 1 tablet by mouth 2 (two) times daily. o Atorvastatin (LIPITOR) 10 MG take 1 tablet by mouth daily. o Diltiazem (CARDIZEM CD) 360 MG 24 hr Take 1 capsule (360 mg total) by mouth daily. o Metoprolol succinate (TOPROL-XL) 100 MG 24 hr take 1 tablet by mouth immediately following meals o Omeprazole (PRILOSEC) 20 MG capsule take 1 capsule by mouth twice a day  o Cyclobenzaprine (FLEXERIL) 10 MG tablet take 1 tablet by mouth three times a day as needed for muscle spasms  o Temazepam (RESTORIL) 30 MG capsule Take 1 capsule (30 mg total) by mouth at bedtime as needed for sleep. o Tramadol (Ultram) 50 MG tablet take two tablets by mouth 3 times daily as needed. o Potassium chloride (KLOR-CON 10) 10 MEQ CR  o Fenofibrate 160 MG tablet o Venlafaxine XR 150 mg 24hr capsule  The patient currently does not need refills.  Confirmed delivery date of 01-30-2020 , advised patient that pharmacy will contact them the morning of delivery.  Follow-Up:  Coordination of Enhanced Pharmacy Services and Pharmacist Review   [name], [place] Assistant [phone]

## 2020-01-15 DIAGNOSIS — E669 Obesity, unspecified: Secondary | ICD-10-CM | POA: Diagnosis not present

## 2020-01-15 DIAGNOSIS — M810 Age-related osteoporosis without current pathological fracture: Secondary | ICD-10-CM | POA: Diagnosis not present

## 2020-01-15 DIAGNOSIS — M5010 Cervical disc disorder with radiculopathy, unspecified cervical region: Secondary | ICD-10-CM | POA: Diagnosis not present

## 2020-01-15 DIAGNOSIS — E039 Hypothyroidism, unspecified: Secondary | ICD-10-CM | POA: Diagnosis not present

## 2020-01-15 DIAGNOSIS — N179 Acute kidney failure, unspecified: Secondary | ICD-10-CM | POA: Diagnosis not present

## 2020-01-15 DIAGNOSIS — R001 Bradycardia, unspecified: Secondary | ICD-10-CM | POA: Diagnosis not present

## 2020-01-15 DIAGNOSIS — E1165 Type 2 diabetes mellitus with hyperglycemia: Secondary | ICD-10-CM | POA: Diagnosis not present

## 2020-01-15 DIAGNOSIS — E785 Hyperlipidemia, unspecified: Secondary | ICD-10-CM | POA: Diagnosis not present

## 2020-01-15 DIAGNOSIS — Z7984 Long term (current) use of oral hypoglycemic drugs: Secondary | ICD-10-CM | POA: Diagnosis not present

## 2020-01-15 DIAGNOSIS — E1142 Type 2 diabetes mellitus with diabetic polyneuropathy: Secondary | ICD-10-CM | POA: Diagnosis not present

## 2020-01-15 DIAGNOSIS — R079 Chest pain, unspecified: Secondary | ICD-10-CM | POA: Diagnosis not present

## 2020-01-15 DIAGNOSIS — K219 Gastro-esophageal reflux disease without esophagitis: Secondary | ICD-10-CM | POA: Diagnosis not present

## 2020-01-15 DIAGNOSIS — I10 Essential (primary) hypertension: Secondary | ICD-10-CM | POA: Diagnosis not present

## 2020-01-15 DIAGNOSIS — E875 Hyperkalemia: Secondary | ICD-10-CM | POA: Diagnosis not present

## 2020-01-15 DIAGNOSIS — Z23 Encounter for immunization: Secondary | ICD-10-CM | POA: Diagnosis not present

## 2020-01-15 DIAGNOSIS — R55 Syncope and collapse: Secondary | ICD-10-CM | POA: Diagnosis not present

## 2020-01-15 DIAGNOSIS — Z9181 History of falling: Secondary | ICD-10-CM | POA: Diagnosis not present

## 2020-01-15 DIAGNOSIS — Z993 Dependence on wheelchair: Secondary | ICD-10-CM | POA: Diagnosis not present

## 2020-01-15 DIAGNOSIS — Z6838 Body mass index (BMI) 38.0-38.9, adult: Secondary | ICD-10-CM | POA: Diagnosis not present

## 2020-01-15 DIAGNOSIS — G8929 Other chronic pain: Secondary | ICD-10-CM | POA: Diagnosis not present

## 2020-01-16 DIAGNOSIS — E785 Hyperlipidemia, unspecified: Secondary | ICD-10-CM | POA: Diagnosis not present

## 2020-01-16 DIAGNOSIS — N179 Acute kidney failure, unspecified: Secondary | ICD-10-CM | POA: Diagnosis not present

## 2020-01-16 DIAGNOSIS — E039 Hypothyroidism, unspecified: Secondary | ICD-10-CM | POA: Diagnosis not present

## 2020-01-16 DIAGNOSIS — Z6838 Body mass index (BMI) 38.0-38.9, adult: Secondary | ICD-10-CM | POA: Diagnosis not present

## 2020-01-16 DIAGNOSIS — Z9181 History of falling: Secondary | ICD-10-CM | POA: Diagnosis not present

## 2020-01-16 DIAGNOSIS — Z7984 Long term (current) use of oral hypoglycemic drugs: Secondary | ICD-10-CM | POA: Diagnosis not present

## 2020-01-16 DIAGNOSIS — Z993 Dependence on wheelchair: Secondary | ICD-10-CM | POA: Diagnosis not present

## 2020-01-16 DIAGNOSIS — M5010 Cervical disc disorder with radiculopathy, unspecified cervical region: Secondary | ICD-10-CM | POA: Diagnosis not present

## 2020-01-16 DIAGNOSIS — E1142 Type 2 diabetes mellitus with diabetic polyneuropathy: Secondary | ICD-10-CM | POA: Diagnosis not present

## 2020-01-16 DIAGNOSIS — E669 Obesity, unspecified: Secondary | ICD-10-CM | POA: Diagnosis not present

## 2020-01-16 DIAGNOSIS — M810 Age-related osteoporosis without current pathological fracture: Secondary | ICD-10-CM | POA: Diagnosis not present

## 2020-01-16 DIAGNOSIS — Z23 Encounter for immunization: Secondary | ICD-10-CM | POA: Diagnosis not present

## 2020-01-16 DIAGNOSIS — I10 Essential (primary) hypertension: Secondary | ICD-10-CM | POA: Diagnosis not present

## 2020-01-16 DIAGNOSIS — K219 Gastro-esophageal reflux disease without esophagitis: Secondary | ICD-10-CM | POA: Diagnosis not present

## 2020-01-16 DIAGNOSIS — E875 Hyperkalemia: Secondary | ICD-10-CM | POA: Diagnosis not present

## 2020-01-16 DIAGNOSIS — R55 Syncope and collapse: Secondary | ICD-10-CM | POA: Diagnosis not present

## 2020-01-16 DIAGNOSIS — G8929 Other chronic pain: Secondary | ICD-10-CM | POA: Diagnosis not present

## 2020-01-16 DIAGNOSIS — R001 Bradycardia, unspecified: Secondary | ICD-10-CM | POA: Diagnosis not present

## 2020-01-16 DIAGNOSIS — R079 Chest pain, unspecified: Secondary | ICD-10-CM | POA: Diagnosis not present

## 2020-01-18 ENCOUNTER — Other Ambulatory Visit: Payer: Self-pay | Admitting: Family Medicine

## 2020-01-25 ENCOUNTER — Telehealth: Payer: Self-pay | Admitting: Family Medicine

## 2020-01-25 NOTE — Telephone Encounter (Signed)
Left message for patient to call back and schedule Medicare Annual Wellness Visit [facility]) either virtually or in office.   Last AWV no information  please schedule at anytime with [facility] Nurse Health Advisor 1 or 2   This should be a 45 minute visit.

## 2020-02-09 ENCOUNTER — Ambulatory Visit: Payer: PPO | Admitting: Pharmacist

## 2020-02-09 DIAGNOSIS — E1165 Type 2 diabetes mellitus with hyperglycemia: Secondary | ICD-10-CM

## 2020-02-09 DIAGNOSIS — I10 Essential (primary) hypertension: Secondary | ICD-10-CM

## 2020-02-09 NOTE — Chronic Care Management (AMB) (Signed)
Chronic Care Management Pharmacy  Name: Christina [name]  MRN: [id number] DOB: 1965-05-25  Initial Questions: 1. Have you seen any other providers since your last visit? Yes  2. Any changes in your medicines or health? No  Chief Complaint/ HPI  Christina [name],  55 y.o. , female presents for their Follow-Up CCM visit with the clinical pharmacist via telephone due to COVID-19 Pandemic.  PCP : Laurey Morale, MD  Their chronic conditions include: HTN, Asthma, GERD, DM, Hypothyroidism, Arthritis, skin lesions, vitamin B12 deficiency, anxiety, insomnia  Office Visits: 08/03/2019- Alysia Penna, MD- Patient presented for office visit for BP and depression/anxiety. BP and BGs improved. Ozempic increased to 1mg  weekly. Patient to continue Effexor XR 150mg  daily. Patient to follow up in 1 month.   07/13/2019- Alysia Penna, MD- Patient presented for office visit for DM and depression/anxiety. Patient given samples of Ozempic 0.5mg  one weekly and glipizide to be stopped. Zoloft stopped and switched to Effexor XR 150mg  daily. Recheck in 3 to 4 weeks.   05/02/2019- Alysia Penna, MD- Patient presented for office visit for well exam. Patient to obtain fasting labs. Patient referred to dermatology for likely basal cell cancer on nose. Patient referred to pulmonary for sleep apnea work up. For reported elevated home BP readings, diltiazem CD increased to 360mg  daily.   Consult Visit: 02/18/2018- Orthopedics- Patient presented to Dwana Melena, PA for s/p achilles tendon rupture. Patient to cover wound with mupirocin. Patient to use cam walker as opposed to barefoot. Follow up in 3 weeks for recheck.   Medications: Outpatient Encounter Medications as of 02/09/2020  Medication Sig  . aspirin 81 MG EC tablet Take 81 mg by mouth daily.    [name] atorvastatin (LIPITOR) 10 MG tablet TAKE 1 TABLET BY MOUTH EVERY DAY  . cephALEXin (KEFLEX) 500 MG capsule TAKE 1 CAPSULE BY MOUTH THREE TIMES A DAY  . cholecalciferol (VITAMIN  D) 1000 UNITS tablet Take 1,000 Units by mouth daily.  . Cinnamon 500 MG TABS Take 2 tablets by mouth daily.  . clobetasol (TEMOVATE) 0.05 % external solution Apply 1 application topically 2 (two) times daily.  . [facility] ULTRA THIN LANCETS MISC 1 strip by Does not apply route as directed.  . Cyanocobalamin (VITAMIN B 12 PO) Take 1,000 Units by mouth daily.  . cyclobenzaprine (FLEXERIL) 10 MG tablet TAKE 1 TABLET BY MOUTH THREE TIMES A DAY AS NEEDED FOR MUSCLE SPASMS  . desonide (DESOWEN) 0.05 % cream Apply topically 2 (two) times daily.    . diclofenac (VOLTAREN) 75 MG EC tablet Take 1 tablet (75 mg total) by mouth 2 (two) times daily.  [name] diltiazem (CARDIZEM CD) 360 MG 24 hr capsule Take 1 capsule (360 mg total) by mouth daily.  . diphenhydrAMINE (BENADRYL) 25 mg capsule Take 25 mg by mouth as needed.    [name] EPINEPHrine 0.3 mg/0.3 mL IJ SOAJ injection Inject 0.3 mg into the muscle as needed for anaphylaxis.  . fenofibrate 160 MG tablet TAKE 1 TABLET BY MOUTH DAILY  . gabapentin (NEURONTIN) 300 MG capsule TAKE 2 CAPSULES (600 MG TOTAL) BY MOUTH 3 (THREE) TIMES DAILY.  [name] glucose blood (TRUETEST TEST) test strip Use as instructed  . hydrochlorothiazide (HYDRODIURIL) 25 MG tablet TAKE 1 TABLET BY MOUTH EVERY DAY  . HYDROmorphone (DILAUDID) 4 MG tablet Take 1 tablet (4 mg total) by mouth every 6 (six) hours as needed for severe pain.  [name] levothyroxine (SYNTHROID) 50 MCG tablet TAKE 1 TABLET (50 MCG TOTAL) BY MOUTH DAILY.  [name]  metFORMIN (GLUCOPHAGE-XR) 500 MG 24 hr tablet TAKE 1 TABLET BY MOUTH TWICE A DAY WITH MEAL  . metoprolol succinate (TOPROL-XL) 100 MG 24 hr tablet TAKE 1 TABLET BY MOUTH DAILY. TAKE WITH OR IMMEDIATELY FOLLOWING A MEAL  . mupirocin ointment (BACTROBAN) 2 % Apply topically 3 (three) times daily.  [name] omeprazole (PRILOSEC) 20 MG capsule TAKE 1 CAPSULE BY MOUTH TWICE A DAY  . potassium chloride (KLOR-CON M10) 10 MEQ tablet TAKE 1 TABLET BY MOUTH EVERY DAY  . promethazine (PHENERGAN) 25 MG  tablet Take 1 tablet (25 mg total) by mouth every 6 (six) hours as needed for nausea.  . Semaglutide,0.25 or 0.5MG /DOS, (OZEMPIC, 0.25 OR 0.5 MG/DOSE,) 2 MG/1.5ML SOPN Inject 0.5 mg into the skin once a week.  . sulfamethoxazole-trimethoprim (BACTRIM DS) 800-160 MG tablet Take 1 tablet by mouth 2 (two) times daily.  . temazepam (RESTORIL) 30 MG capsule Take 1 capsule (30 mg total) by mouth at bedtime as needed for sleep.  . traMADol (ULTRAM) 50 MG tablet TAKE TWO TABLETS BY MOUTH THREE TIMES DAILY AS NEEDED FOR moderate pain  . triamcinolone cream (KENALOG) 0.1 % Apply 1 application topically 2 (two) times daily.  [name] venlafaxine XR (EFFEXOR-XR) 150 MG 24 hr capsule TAKE 1 CAPSULE (150 MG TOTAL) BY MOUTH DAILY WITH BREAKFAST.  [name] vitamin B-12 (CYANOCOBALAMIN) 1000 MCG tablet Take 1,000 mcg by mouth daily. 2500 mcg once daily  . vitamin E (VITAMIN E) 180 MG (400 UNITS) capsule Take 400 Units by mouth daily.  . [DISCONTINUED] potassium chloride (KLOR-CON 10) 10 MEQ CR tablet Take 1 tablet (10 mEq total) by mouth daily.   No facility-administered encounter medications on file as of 02/09/2020.   Patient is feeling much better since recovering from [place].   Current Diagnosis/Assessment:  Goals Addressed            This Visit's Progress   . Pharmacy Care Plan       CARE PLAN ENTRY  Current Barriers:  . Chronic Disease Management support, education, and care coordination needs related to Hypertension, Hyperlipidemia, Diabetes, Depression, and Anxiety   Hypertension . Pharmacist Clinical Goal(s): o Over the next 60 days, patient will work with PharmD and providers to achieve BP goal <130/80 . Current regimen:  o diltiazem (Cardizem CD) 360mg , 1 capsule daily o metoprolol succinate 100mg  once daily . Interventions: o We discussed the importance of continued checking blood pressure and the correct technique for checking blood pressure (sitting down with legs uncrossed and not talking,  avoiding caffeine for 60 minutes before, and going to the bathroom prior to checking) . Patient self care activities - Over the next 60 days, patient will: o Check BP as instructed, document, and provide at future appointments o Ensure daily salt intake < 2300 mg/day  Hyperlipidemia/ hypertriglyceridemia . Pharmacist Clinical Goal(s): o Over the next 60 days, patient will work with PharmD and providers to maintain LDL goal < 100 . Current regimen:  o atorvastatin 10mg , 1 tablet daily o fenofibrate 160mg , 1 tablet daily  . Interventions: o Discussed lowering cholesterol through diet by: [name] Limiting foods with cholesterol such as liver and other organ meats, egg yolks, shrimp, and whole milk dairy products . Avoiding saturated fats and trans fats and incorporating healthier fats, such as lean meat, nuts, and unsaturated oils like canola and olive oils . Eating foods with soluble fiber such as whole-grain cereals such as oatmeal and oat bran, fruits such as apples, bananas, oranges, pears, and prunes, legumes such  as kidney beans, lentils, chick peas, black-eyed peas, and lima beans, and green leafy vegetables . Limiting alcohol intake . Patient self care activities - Over the next 60 days, patient will: o Work on incorporating more fruits and vegetables in diet.   Diabetes . Pharmacist Clinical Goal(s): o Over the next 60 days, patient will work with PharmD and providers to maintain A1c goal <7% . Current regimen:  o metformin XR 500mg  1 tablet twice daily  o semaglutide (Ozempic) 0.5 mg once a week . Interventions: o We discussed possibly increasing to Ozempic 1 mg dose dependingson results of A1c check with Dr. Sarajane Jews . Patient self care activities - Over the next 60 days, patient will: o Check blood sugar as directed, document, and provide at future appointments  Anxiety/ depression  . Pharmacist Clinical Goal(s) o Over the next 60 days, patient will work with PharmD and providers to  address concerns and provide further management.  . Current regimen:   venlafaxine XR 150mg , 1 capsule one daily with breakfast  . Patient self care activities - Over the next 60 days, patient will: o Continue current medications and follow up visits.   Medication management . Pharmacist Clinical Goal(s): o Over the next 60 days, patient will work with PharmD and providers to achieve optimal medication adherence . Current pharmacy: [facility] . Interventions o Comprehensive medication review performed. o Utilize [facility] pharmacy for medication synchronization, packaging and delivery . Patient self care activities - Over the next 60 days, patient will: o Take medications as prescribed o Report any questions or concerns to PharmD and/or provider(s)  Please see past updates related to this goal by clicking on the "Past Updates" button in the selected goal               Diabetes  A1C goal of < 7  Recent Relevant Labs: Lab Results  Component Value Date/Time   HGBA1C 9.3 (H) 05/02/2019 02:51 PM   HGBA1C 7.9 (A) 11/30/2018 02:11 PM   HGBA1C 7.5 (H) 03/17/2018 04:39 PM   MICROALBUR 2.5 (H) 02/21/2014 02:22 PM   MICROALBUR 1.7 10/05/2013 10:42 AM    Checking BGs: about 3 times per week.   Recent BGs readings: 271 (sometimes before she eats or after she eats)   Patient has failed these meds in past: glipizide   Patient is currently uncontrolled (based on A1c alone) on the following medications:   semaglutide (Ozempic) 0.5 mg once a week   metformin XR 500mg  1 tablet twice daily    Last diabetic Eye exam: No results found for: HMDIABEYEEXA  - Patient notes last eye exam was 4 years ago. Counseled importance of diabetic eye exam every 1 to 2 years.   Last diabetic Foot exam: No results found for: HMDIABFOOTEX  - Discussed foot exams at least once yearly. Patient endorses daily feet inspections at home.   We discussed:    Possible dose increase of Ozempic depending on  appt with PCP  Patient assistance requirements for NovoNordisk   Plan  Will discuss with PCP after results of A1c - plan to increase Ozempic to 1 mg weekly if A1c >7%. Patient will get a notebook to write down blood sugars in relation to meals.  Hyperlipidemia  LDL goal < 70  Lipid Panel     Component Value Date/Time   CHOL 198 02/10/2020 1535   TRIG 366 (H) 02/10/2020 1535   TRIG 160 (H) 12/30/2005 1425   HDL 52 02/10/2020 1535   CHOLHDL 3.8  02/10/2020 1535   VLDL 53.6 (H) 05/02/2019 1451   LDLCALC 97 02/10/2020 1535   LDLDIRECT 107.0 05/02/2019 1451    The 10-year ASCVD risk score Mikey Bussing DC Jr., et al., 2013) is: 7.3%  Patient has failed these meds in past: none   Patient is currently uncontrolled on the following medications:   atorvastatin 10mg , 1 tablet daily  fenofibrate 160mg , 1 tablet daily   We discussed:  diet and exercise extensively - How to reduce cholesterol through diet/weight management and physical activity.    - We discussed how a diet high in plant sterols (fruits/vegetables/nuts/whole grains/legumes) may reduce your cholesterol.     Plan Continue current medications.  May consider increasing statin intensity for additional LDL and triglyceride lowering benefit. Could consider the addition of Vascepa for TG lowering depending on results of lipid panel.  Hypertension  BP goal is < 130/31mmHg   Office blood pressures are  BP Readings from Last 3 Encounters:  02/10/20 (!) 150/90  11/07/19 128/78  08/03/19 140/80   Patient was these meds in the past: - lisinopril (facial swelling) - metoprolol succinate 100mg , HCTZ 25mg  (patient reports these were stopped when she started diltiazem) - no notes found in patient chart stating this.  Patient checks BP at home daily  Patient home BP readings are ranging: n/a  Patient is uncontrolled on:   diltiazem (Cardizem CD) 360mg , 1 capsule daily - in PM  metoprolol succinate 100mg  once daily  - in  AM  We discussed   Stress and pain and affect on blood pressure.  Plan Continue current medications.  Continue checking BP at home daily and record including time of day.  Hypothyroidism   TSH  Date Value Ref Range Status  02/10/2020 2.40 mIU/L Final    Comment:              Reference Range .           > or = 20 Years  0.40-4.50 .                Pregnancy Ranges           First trimester    0.26-2.66           Second trimester   0.55-2.73           Third trimester    0.43-2.91     No components found for: T4  Patient has failed these meds in past: none  Patient is currently controlled on the following medications:   levothyroxine 60mcg, 1 tablet daily  We discussed: consistent administration prior to other medications and on an empty stomach   Plan Continue current medications  Asthma  Patient notes she was diagnosed with asthma over 20 years ago. Working conditions made breathing worse. She denies having any breathing problems at the moment.   Eosinophil count:   Lab Results  Component Value Date/Time   EOSPCT 1.4 05/02/2019 02:51 PM  %                               Eos (Absolute):  Lab Results  Component Value Date/Time   EOSABS 0.1 05/02/2019 02:51 PM   Tobacco Status:  Social History   Tobacco Use  Smoking Status Former Smoker  Smokeless Tobacco Never Used  - Patient endorse stopped smoking 12-14 years ago.   Patient has failed these meds in past: patient does not remember names of inhalers ("made  her heart do weird things and sent her into panic").   Patient is currently controlled on the following medications:  - currently not on any inhalers.   Using maintenance inhaler regularly? No Frequency of rescue inhaler use:  never  We discussed:   rescue inhalers that do not affect heart as much (selective beta-2 agonist; i.e. Xopenex)  Plan Continue current management.   GERD  Patient has failed these meds in past: none   Patient is  currently controlled on the following medications:   omeprazole 20mg , 1 capsule twice daily    We discussed:  Discussed non-pharmacological interventions for acid reflux. Take measures to prevent acid reflux, such as avoiding spicy foods, avoiding caffeine, avoid laying down a few hours after eating, and raising the head of the bed.  Plan Continue current medications.    Vitamin B12 deficiency   Patient has failed these meds in past: none  Patient is currently managed on the following medications:   cyanocobalamin 1000 units daily   Plan Continue current medications. Recommend vitamin B12 level.   Arthritis/ muscle spasms    Patient endorses medications for a long time and it's the only thing that works for her back pain.   Patient has failed these meds in past: Opana (oxymorphone)   Patient is currently controlled on the following medications:  diclofenac 75mg , 1 tablet twice daily - tries to not take scheduled   tramadol 50mg , 2 tablets three times daily as needed for moderate pain   hydromorphone 4mg , 1 tablet every six hours as needed for severe pain    cyclobenzaprine 10mg , 1 tablet three times daily as needed for muscle spasms   benadryl 25 mg - takes this only with the hydromorphone  Voltaren gel (diclofenac)   We discussed:  discussed lower bleed risk with diclofenac gel vs tablets  Plan Continue current medications.   Diabetic neuropathy   Patient has failed these meds in past: none  Patient is currently controlled on the following medications:   gabapentin 300mg , 2 capsules three times daily   Plan  Continue current medications.   Skin lesions   Patient has failed these meds in past: none  Patient is currently controlled on the following medications:   mupirocin 2% ointment, apply three times daily   Bactrim DS, 1 tablet twice daily  triamcinolone 0.1% cream, apply twice daily  Plan Provided patient information of referral made to derm.   Continue current medications.   Anxiety / depression   Patient reports being in a better spot and does feel as though this is working.  Patient has failed these meds in past: sertraline Patient is currently controlled on the following medications:   venlafaxine XR 150mg , 1 capsule one daily with breakfast   Plan Continue current medications   Insomnia   Patient reports she is still napping during the day.   Patient has failed these meds in past: none  Patient is currently uncontrolled on the following medications:   temazepam 30mg , 1 capsule at bedtime as needed for sleep  Melatonin 10 mg, 1 capsule at bedtime   We discussed:  Discussed non-pharmacological interventions for insomnia. (Avoid napping, limit exposure to technology near bedtime, etc). ; discussed trying 5 mg dose instead of 10 mg and stressed the importance of timed release to help with staying asleep  Plan Continue current medications.     Aspirin therapy   Patient is currently managed on the following medications:   ASA 81mg  once daily  Plan Continue current medications.   Miscellaneous   Patient is currently  on the following medications:  . Biotin 5000 mcg (collagen support) 1 tablet daily  We discussed:  Patient reports she is trying this to help with hair growth  Plan  Continue current medications   Vaccines   Reviewed and discussed patient's vaccination history.    Immunization History  Administered Date(s) Administered  . Influenza Whole 12/27/2004, 11/27/2008  . Influenza,inj,Quad PF,6+ Mos 10/08/2012, 10/05/2013, 10/06/2014, 01/04/2016, 10/29/2016, 12/23/2017, 11/30/2018, 11/07/2019  . PFIZER(Purple Top)SARS-COV-2 Vaccination 05/20/2019, 06/15/2019  . Pneumococcal Polysaccharide-23 01/16/2020  . Tdap 04/01/2017   Plan Recommended for patient to get the shingles vaccine at the pharmacy.  Medication Management   Patient organizes medications:  husbands helps her organize  medications and makes she takes her meds.  Financial barriers: Ozempic cost prohibitive Transportation: patient does not drive; spouse takes patient to appointments and picks up medications Primary pharmacy: [facility] Adherence: no gaps in refill history  Plan:  Continue utilizing [facility] pharmacy for medications.   Follow up Follow up appointment in 2 months for DM, BP, and insomnia reassessment.    [name], PharmD Clinical Pharmacist [facility]

## 2020-02-10 ENCOUNTER — Encounter: Payer: Self-pay | Admitting: Family Medicine

## 2020-02-10 ENCOUNTER — Other Ambulatory Visit: Payer: Self-pay

## 2020-02-10 ENCOUNTER — Ambulatory Visit (INDEPENDENT_AMBULATORY_CARE_PROVIDER_SITE_OTHER): Payer: PPO | Admitting: Family Medicine

## 2020-02-10 VITALS — BP 150/90 | HR 81 | Temp 98.0°F | Ht 62.5 in | Wt 212.4 lb

## 2020-02-10 DIAGNOSIS — E1165 Type 2 diabetes mellitus with hyperglycemia: Secondary | ICD-10-CM

## 2020-02-10 DIAGNOSIS — E038 Other specified hypothyroidism: Secondary | ICD-10-CM | POA: Diagnosis not present

## 2020-02-10 DIAGNOSIS — I10 Essential (primary) hypertension: Secondary | ICD-10-CM | POA: Diagnosis not present

## 2020-02-10 DIAGNOSIS — E538 Deficiency of other specified B group vitamins: Secondary | ICD-10-CM

## 2020-02-10 DIAGNOSIS — M2550 Pain in unspecified joint: Secondary | ICD-10-CM | POA: Diagnosis not present

## 2020-02-10 DIAGNOSIS — E785 Hyperlipidemia, unspecified: Secondary | ICD-10-CM | POA: Diagnosis not present

## 2020-02-10 LAB — POCT GLYCOSYLATED HEMOGLOBIN (HGB A1C): Hemoglobin A1C: 7.9 % — AB (ref 4.0–5.6)

## 2020-02-10 NOTE — Addendum Note (Signed)
Addended by: Matilde Sprang on: 02/10/2020 05:24 PM   Modules accepted: Orders

## 2020-02-10 NOTE — Progress Notes (Signed)
   Subjective:    Patient ID: Christina [name], female    DOB: April 23, 1965, 55 y.o.   MRN: [id number]  HPI Here to follow up a stay at [facility] from 01-16-20 to 01-17-20 for dizziness, near syncope, and chest pain. At home she fel dizzy and almost passed out. Her BP was quite low, around 70/50, but this was a bit higher in the ER. She was given IV fluids and her BP normalized. She quickly felt better. No cough or SOB. She tested negative for Covid. She had a full cardiac workup including a Dobutamine stress test and an ECHO, which were all normal. No clear etiology was found, though it seems likely to me that she had a bad bout of vertigo and a vagal episode caused hypotension. She has felt fine since getting home. She has been taking Ozempic 0.5 mg weekly, and today her A1c is 7.9 (down from 9.3 last April).    Review of Systems  Constitutional: Negative.   Respiratory: Negative.   Cardiovascular: Negative.   Gastrointestinal: Negative.   Musculoskeletal: Positive for arthralgias and back pain.       Objective:   Physical Exam Constitutional:      Appearance: Normal appearance.     Comments: In a wheelchair   Cardiovascular:     Rate and Rhythm: Normal rate and regular rhythm.     Pulses: Normal pulses.     Heart sounds: Normal heart sounds.  Pulmonary:     Effort: Pulmonary effort is normal.     Breath sounds: Normal breath sounds.  Neurological:     Mental Status: She is alert and oriented to person, place, and time. Mental status is at baseline.           Assessment & Plan:  She seems to be doing well today as far as HTN and asthma. For the diabetes we will increase the Ozempic to 1mg  weekly. Get fasting labs today including a thyroid panel. Refer to Rheumatology for joint pains.  Alysia Penna, MD

## 2020-02-11 LAB — CBC WITH DIFFERENTIAL/PLATELET
Absolute Monocytes: 392 cells/uL (ref 200–950)
Basophils Absolute: 28 cells/uL (ref 0–200)
Basophils Relative: 0.4 %
Eosinophils Absolute: 70 cells/uL (ref 15–500)
Eosinophils Relative: 1 %
HCT: 36.5 % (ref 35.0–45.0)
Hemoglobin: 12.1 g/dL (ref 11.7–15.5)
Lymphs Abs: 1904 cells/uL (ref 850–3900)
MCH: 27.4 pg (ref 27.0–33.0)
MCHC: 33.2 g/dL (ref 32.0–36.0)
MCV: 82.6 fL (ref 80.0–100.0)
MPV: 10.3 fL (ref 7.5–12.5)
Monocytes Relative: 5.6 %
Neutro Abs: 4606 cells/uL (ref 1500–7800)
Neutrophils Relative %: 65.8 %
Platelets: 184 10*3/uL (ref 140–400)
RBC: 4.42 10*6/uL (ref 3.80–5.10)
RDW: 13.8 % (ref 11.0–15.0)
Total Lymphocyte: 27.2 %
WBC: 7 10*3/uL (ref 3.8–10.8)

## 2020-02-11 LAB — BASIC METABOLIC PANEL
BUN: 21 mg/dL (ref 7–25)
CO2: 27 mmol/L (ref 20–32)
Calcium: 9.4 mg/dL (ref 8.6–10.4)
Chloride: 102 mmol/L (ref 98–110)
Creat: 1.05 mg/dL (ref 0.50–1.05)
Glucose, Bld: 163 mg/dL — ABNORMAL HIGH (ref 65–99)
Potassium: 4.4 mmol/L (ref 3.5–5.3)
Sodium: 138 mmol/L (ref 135–146)

## 2020-02-11 LAB — HEPATIC FUNCTION PANEL
AG Ratio: 1.5 (calc) (ref 1.0–2.5)
ALT: 25 U/L (ref 6–29)
AST: 20 U/L (ref 10–35)
Albumin: 4.3 g/dL (ref 3.6–5.1)
Alkaline phosphatase (APISO): 50 U/L (ref 37–153)
Bilirubin, Direct: 0.1 mg/dL (ref 0.0–0.2)
Globulin: 2.8 g/dL (calc) (ref 1.9–3.7)
Indirect Bilirubin: 0.3 mg/dL (calc) (ref 0.2–1.2)
Total Bilirubin: 0.4 mg/dL (ref 0.2–1.2)
Total Protein: 7.1 g/dL (ref 6.1–8.1)

## 2020-02-11 LAB — LIPID PANEL
Cholesterol: 198 mg/dL (ref <200)
HDL: 52 mg/dL (ref >=50)
LDL Cholesterol (Calc): 97 mg/dL (calc)
Non-HDL Cholesterol (Calc): 146 mg/dL (calc) — ABNORMAL HIGH (ref <130)
Total CHOL/HDL Ratio: 3.8 (calc) (ref <5.0)
Triglycerides: 366 mg/dL — ABNORMAL HIGH (ref <150)

## 2020-02-11 LAB — VITAMIN B12: Vitamin B-12: >2000 pg/mL — ABNORMAL HIGH (ref 200–1100)

## 2020-02-11 LAB — TSH: TSH: 2.4 mIU/L

## 2020-02-11 LAB — T4, FREE: Free T4: 1.2 ng/dL (ref 0.8–1.8)

## 2020-02-11 LAB — T3, FREE: T3, Free: 2.1 pg/mL — ABNORMAL LOW (ref 2.3–4.2)

## 2020-02-14 NOTE — Progress Notes (Signed)
Mychart message sent: Normal except for the diabetes, which we have already addressed

## 2020-03-02 ENCOUNTER — Telehealth: Payer: Self-pay | Admitting: Pharmacist

## 2020-03-05 NOTE — Chronic Care Management (AMB) (Signed)
Chronic Care Management Pharmacy Assistant   Name: Christina [name]  MRN: [id number] DOB: April 15, 1965  Reason for Encounter: Medication Review/Patient Assistance   PCP : Laurey Morale, MD  Allergies:   Allergies  Allergen Reactions  . Albuterol   . Antihistamines, Loratadine-Type   . Bee Venom   . Clindamycin/Lincomycin   . Codeine   . Doxycycline   . Lisinopril     REACTION: facial swelling  . Metronidazole     REACTION: rash  . Ondansetron Hcl     REACTION: rash  . Oxycodone-Acetaminophen   . Tylox [Oxycodone-Acetaminophen]   . Vancomycin     REACTION: rash  . Benadryl [Diphenhydramine] Nausea And Vomiting  . Lidocaine Rash    Medications: Outpatient Encounter Medications as of 03/02/2020  Medication Sig  . aspirin 81 MG EC tablet Take 81 mg by mouth daily.    [name] atorvastatin (LIPITOR) 10 MG tablet TAKE 1 TABLET BY MOUTH EVERY DAY  . cephALEXin (KEFLEX) 500 MG capsule TAKE 1 CAPSULE BY MOUTH THREE TIMES A DAY  . cholecalciferol (VITAMIN D) 1000 UNITS tablet Take 1,000 Units by mouth daily.  . Cinnamon 500 MG TABS Take 2 tablets by mouth daily.  . clobetasol (TEMOVATE) 0.05 % external solution Apply 1 application topically 2 (two) times daily.  . [facility]S ULTRA THIN LANCETS MISC 1 strip by Does not apply route as directed.  . Cyanocobalamin (VITAMIN B 12 PO) Take 1,000 Units by mouth daily.  . cyclobenzaprine (FLEXERIL) 10 MG tablet TAKE 1 TABLET BY MOUTH THREE TIMES A DAY AS NEEDED FOR MUSCLE SPASMS  . desonide (DESOWEN) 0.05 % cream Apply topically 2 (two) times daily.    . diclofenac (VOLTAREN) 75 MG EC tablet Take 1 tablet (75 mg total) by mouth 2 (two) times daily.  [name] diltiazem (CARDIZEM CD) 360 MG 24 hr capsule Take 1 capsule (360 mg total) by mouth daily.  . diphenhydrAMINE (BENADRYL) 25 mg capsule Take 25 mg by mouth as needed.    [name] EPINEPHrine 0.3 mg/0.3 mL IJ SOAJ injection Inject 0.3 mg into the muscle as needed for anaphylaxis.  . fenofibrate 160 MG tablet TAKE 1  TABLET BY MOUTH DAILY  . gabapentin (NEURONTIN) 300 MG capsule TAKE 2 CAPSULES (600 MG TOTAL) BY MOUTH 3 (THREE) TIMES DAILY.  [name] glucose blood (TRUETEST TEST) test strip Use as instructed  . hydrochlorothiazide (HYDRODIURIL) 25 MG tablet TAKE 1 TABLET BY MOUTH EVERY DAY  . HYDROmorphone (DILAUDID) 4 MG tablet Take 1 tablet (4 mg total) by mouth every 6 (six) hours as needed for severe pain.  [name] levothyroxine (SYNTHROID) 50 MCG tablet TAKE 1 TABLET (50 MCG TOTAL) BY MOUTH DAILY.  . metFORMIN (GLUCOPHAGE-XR) 500 MG 24 hr tablet TAKE 1 TABLET BY MOUTH TWICE A DAY WITH MEAL  . metoprolol succinate (TOPROL-XL) 100 MG 24 hr tablet TAKE 1 TABLET BY MOUTH DAILY. TAKE WITH OR IMMEDIATELY FOLLOWING A MEAL  . mupirocin ointment (BACTROBAN) 2 % Apply topically 3 (three) times daily.  [name] omeprazole (PRILOSEC) 20 MG capsule TAKE 1 CAPSULE BY MOUTH TWICE A DAY  . potassium chloride (KLOR-CON M10) 10 MEQ tablet TAKE 1 TABLET BY MOUTH EVERY DAY  . promethazine (PHENERGAN) 25 MG tablet Take 1 tablet (25 mg total) by mouth every 6 (six) hours as needed for nausea.  . Semaglutide,0.25 or 0.5MG /DOS, (OZEMPIC, 0.25 OR 0.5 MG/DOSE,) 2 MG/1.5ML SOPN Inject 0.5 mg into the skin once a week.  . sulfamethoxazole-trimethoprim (BACTRIM DS) 800-160 MG tablet  Take 1 tablet by mouth 2 (two) times daily.  . temazepam (RESTORIL) 30 MG capsule Take 1 capsule (30 mg total) by mouth at bedtime as needed for sleep.  . traMADol (ULTRAM) 50 MG tablet TAKE TWO TABLETS BY MOUTH THREE TIMES DAILY AS NEEDED FOR moderate pain  . triamcinolone cream (KENALOG) 0.1 % Apply 1 application topically 2 (two) times daily.  [name] venlafaxine XR (EFFEXOR-XR) 150 MG 24 hr capsule TAKE 1 CAPSULE (150 MG TOTAL) BY MOUTH DAILY WITH BREAKFAST.  [name] vitamin B-12 (CYANOCOBALAMIN) 1000 MCG tablet Take 1,000 mcg by mouth daily. 2500 mcg once daily  . vitamin E (VITAMIN E) 180 MG (400 UNITS) capsule Take 400 Units by mouth daily.   No facility-administered encounter  medications on file as of 03/02/2020.    Current Diagnosis: Patient Active Problem List   Diagnosis Date Noted  . Hyperlipidemia   . Depression with anxiety 07/13/2019  . S/P Achilles tendon repair 02/18/2018  . Psoriasis 10/29/2016  . Morbid obesity ([place]) 01/04/2016  . Hypothyroidism 04/05/2014  . Type 2 diabetes mellitus, uncontrolled ([place]) 03/06/2014  . NECK PAIN 11/28/2009  . BRACHIAL NEURITIS OR RADICULITIS NOS 11/28/2009  . B12 deficiency 09/14/2009  . HYPERTRIGLYCERIDEMIA 09/14/2009  . Diabetic neuropathy ([place]) 09/11/2009  . ECZEMA 06/07/2009  . ALLERGIC URTICARIA 05/02/2009  . ANGIOEDEMA 05/02/2009  . SKIN LESION 11/27/2008  . Anxiety state 07/26/2008  . ABDOMINAL PAIN, GENERALIZED 04/03/2008  . [place] DISEASE, LUMBAR 06/22/2007  . ARTHRITIS 06/11/2007  . LOW BACK PAIN 12/28/2006  . Essential hypertension 09/11/2006  . Asthma 09/11/2006  . GERD 09/11/2006  . History of colonic polyps 09/11/2006    Goals Addressed   None    Reviewed chart for medication changes ahead of medication coordination call. No OVs, Consults, or hospital visits since last care coordination call/Pharmacist visit.  No medication changes indicated.   BP Readings from Last 3 Encounters:  02/10/20 (!) 150/90  11/07/19 128/78  08/03/19 140/80    Lab Results  Component Value Date   HGBA1C 7.9 (A) 02/10/2020     Patient obtains medications through Adherence Packaging  90 Days  Last adherence delivery included:  [name] Metoprolol succinate (TOPROL-XL) 100 MG 24 hr take 1 tablet by mouth immediately following meals . Potassium chloride (KLOR-CON 10) 10 MEQ CR: one tablet daily . Fenofibrate 160 mg: one tablet daily . Venlafaxine XR 150 mg 24 hr: one capsule every morning  Patient declined the following medication last month due to PRN use/additional supply on hand. . Levothyroxine (SYNTHROID) 50 mcg: take 1 tablet by mouth daily  . Atorvastatin (LIPITOR) 10 mg: take 1 tablet by mouth  daily. . Tramadol (Ultram) 50 mg: take two tablets by mouth 3 times daily as needed. . Sulfamethoxazole-trimethoprim (BACTRIM DS) 800-160 MG take 1 tablet by mouth 2 (two) times daily. . Diltiazem (CARDIZEM CD) 360 MG 24 hr: take one capsule (360 mg total) by mouth daily . Omeprazole (PRILOSEC) 20 MG capsule take one capsule by mouth twice a day  . Cyclobenzaprine (FLEXERIL) 10 mg: take one tablet by mouth three times a day as needed for muscle spasms . Temazepam (RESTORIL) 30 mg: take one capsule (30 mg total) by mouth at bedtime as needed for sleep. . Diclofenac (VOLTAREN) 75 mg: one tablet by mouth twice a day . Gabapentin (NEURONTIN) 300 MG take 2 capsules (600 mg total) by mouth 3 times daily . Metformin (GLUCOPHAGE-XR) 500 MG 24 hr: One tablet twice a day . Semaglutide (OZEMPIC, 0.25 OR 0.5  MG/DOSE,) 2 MG/1.5 ML SOPN . Metoprolol succinate (TOPROL-XL) 100 MG 24 hr take 1 tablet by mouth immediately following meals . Potassium chloride (KLOR-CON 10) 10 MEQ CR: one tablet daily . Fenofibrate 160 mg: one tablet daily . Venlafaxine XR 150 mg 24 hr: one capsule every morning  Patient is due for next adherence delivery on: 03/16/2020. Called patient and reviewed medications and coordinated delivery. This delivery to include: . Levothyroxine (SYNTHROID) 50 mcg: take 1 tablet by mouth daily  . Atorvastatin (LIPITOR) 10 mg: take 1 tablet by mouth daily. . Sulfamethoxazole-trimethoprim (BACTRIM DS) 800-160 MG take 1 tablet by mouth 2 (two) times daily . Gabapentin (NEURONTIN) 300 MG take 2 capsules (600 mg total) by mouth 3 times daily  I spoke with the patient and review medications. There are no changes in medications currently. Patient declined these medication this month due to PRN use/additional supply on hand.The patient is taking the following medications: .  Tramadol (Ultram) 50 mg: take two tablets by mouth 3 times daily as needed. . Diltiazem (CARDIZEM CD) 360 MG 24 hr: take one  capsule (360 mg total) by mouth daily. . Metoprolol succinate (TOPROL-XL) 100 MG 24 hr take 1 tablet by mouth immediately following meals . Omeprazole (PRILOSEC) 20 MG capsule take one capsule by mouth twice a day  . Cyclobenzaprine (FLEXERIL) 10 mg: take one tablet by mouth three times a day as needed for muscle spasms . Temazepam (RESTORIL) 30 mg: take one capsule (30 mg total) by mouth at bedtime as needed for sleep. . Potassium chloride (KLOR-CON 10) 10 MEQ CR: one tablet daily . Fenofibrate 160 mg: one tablet daily . Venlafaxine XR 150 mg 24 hr: one capsule every morning . Diclofenac (VOLTAREN) 75 mg: one tablet by mouth twice a day . Metformin (GLUCOPHAGE-XR) 500 MG 24 hr: One tablet twice a day . Semaglutide (OZEMPIC, 0.25 OR 0.5 MG/DOSE,) 2 MG/1.5 ML SOPN   She currently does not need refills. Confirmed delivery date of 03/16/2020, advised patient that pharmacy will contact them the morning of delivery.  Follow-Up:  Coordination of Enhanced Pharmacy Services and Patient Assistance Coordination  I called and spoke with Darcus Pester) at [facility] about patient applications. It was received.  Her application was approved. Unfortunately, shipping is a little behind. Her medication should arrive 21 business days after fully completed. Expected delivery on 04/04/20 (21 business days The patient is eligible to reapply in October with a deadline of November 30th.  [name], [place] Assistant [phone]

## 2020-03-15 ENCOUNTER — Telehealth: Payer: Self-pay | Admitting: Pharmacist

## 2020-03-15 NOTE — Chronic Care Management (AMB) (Signed)
03/15/2020- Refill requested for Gabapentin 300 mg- 2 capsules three times a day to [facility] for a 90 day supply. Patient has a coordinated delivery on 03/16/2020. [name], CPP notified.  Pattricia Boss, [place] Pharmacist Assistant [phone]

## 2020-03-16 ENCOUNTER — Telehealth: Payer: Self-pay | Admitting: Family Medicine

## 2020-03-16 MED ORDER — GABAPENTIN 300 MG PO CAPS: ORAL_CAPSULE | ORAL | 3 refills | Status: DC

## 2020-03-16 NOTE — Telephone Encounter (Signed)
-----   Message from Lamarr Lulas, [place] sent at 03/16/2020  3:14 PM EST ----- Regarding: FW: Refill request  ----- Message ----- From: Juliane Poot Sent: 03/15/2020   8:39 AM EST To: Dagoberto Ligas Subject: Refill request                                 Good morning,   Patient needs a 90 day refill to [facility] please of Gabapentin 300 mg for her delivery on 03/16/2020. Thank you  Pattricia Boss, [place] Pharmacist Assistant [phone]

## 2020-03-16 NOTE — Telephone Encounter (Signed)
Done

## 2020-03-23 ENCOUNTER — Telehealth: Payer: Self-pay | Admitting: Pharmacist

## 2020-03-23 NOTE — Chronic Care Management (AMB) (Addendum)
    Chronic Care Management Pharmacy Assistant   Name: Christina [name]  MRN: [id number] DOB: Jun 13, 1965  Reason for Encounter: Medication Review  Patient Questions:  1.  Have you seen any other providers since your last visit? No  2.  Any changes in your medicines or health? No   Received call from patient regarding medication management via [facility].  Patient requested an acute fill for Diltiazem (CARDIZEM CD) 360 MG 24 hr capsule to be delivered: 03-27-2020 Pharmacy needs refills? No  Confirmed delivery date of 03-27-2020, advised patient that pharmacy will contact them the morning of delivery.  Follow-Up:  Coordination of Enhanced Pharmacy Services and Pharmacist Review   Neita Goodnight) Mare Ferrari, [place] [phone]  Dropped of patient's shipment of Ozempic from [facility] at the pharmacy to be delivered along with diltiazem 03/27/20.  [name], PharmD [facility] Clinical Pharmacist [place] at [place]

## 2020-03-26 ENCOUNTER — Telehealth: Payer: Self-pay

## 2020-03-26 NOTE — Telephone Encounter (Signed)
Called pt this morning, left a voice message to pick up Ozempic samples in the office

## 2020-04-09 ENCOUNTER — Telehealth: Payer: Self-pay | Admitting: Pharmacist

## 2020-04-09 NOTE — Chronic Care Management (AMB) (Addendum)
Chronic Care Management Pharmacy Assistant   Name: Christina [name]  MRN: [id number] DOB: 25-Oct-1965  Reason for Encounter: Medication Review   Recent office visits:  None  Recent consult visits:  None  Hospital visits:  None in previous 6 months  Medications: Outpatient Encounter Medications as of 04/09/2020  Medication Sig   aspirin 81 MG EC tablet Take 81 mg by mouth daily.     atorvastatin (LIPITOR) 10 MG tablet TAKE 1 TABLET BY MOUTH EVERY DAY   cephALEXin (KEFLEX) 500 MG capsule TAKE 1 CAPSULE BY MOUTH THREE TIMES A DAY   cholecalciferol (VITAMIN D) 1000 UNITS tablet Take 1,000 Units by mouth daily.   Cinnamon 500 MG TABS Take 2 tablets by mouth daily.   clobetasol (TEMOVATE) 0.05 % external solution Apply 1 application topically 2 (two) times daily.   [facility]S ULTRA THIN LANCETS MISC 1 strip by Does not apply route as directed.   Cyanocobalamin (VITAMIN B 12 PO) Take 1,000 Units by mouth daily.   cyclobenzaprine (FLEXERIL) 10 MG tablet TAKE 1 TABLET BY MOUTH THREE TIMES A DAY AS NEEDED FOR MUSCLE SPASMS   desonide (DESOWEN) 0.05 % cream Apply topically 2 (two) times daily.     diclofenac (VOLTAREN) 75 MG EC tablet Take 1 tablet (75 mg total) by mouth 2 (two) times daily.   diltiazem (CARDIZEM CD) 360 MG 24 hr capsule Take 1 capsule (360 mg total) by mouth daily.   diphenhydrAMINE (BENADRYL) 25 mg capsule Take 25 mg by mouth as needed.     EPINEPHrine 0.3 mg/0.3 mL IJ SOAJ injection Inject 0.3 mg into the muscle as needed for anaphylaxis.   fenofibrate 160 MG tablet TAKE 1 TABLET BY MOUTH DAILY   gabapentin (NEURONTIN) 300 MG capsule TAKE 2 CAPSULES (600 MG TOTAL) BY MOUTH 3 (THREE) TIMES DAILY.   glucose blood (TRUETEST TEST) test strip Use as instructed   hydrochlorothiazide (HYDRODIURIL) 25 MG tablet TAKE 1 TABLET BY MOUTH EVERY DAY   HYDROmorphone (DILAUDID) 4 MG tablet Take 1 tablet (4 mg total) by mouth every 6 (six) hours as needed for severe pain.   levothyroxine  (SYNTHROID) 50 MCG tablet TAKE 1 TABLET (50 MCG TOTAL) BY MOUTH DAILY.   metFORMIN (GLUCOPHAGE-XR) 500 MG 24 hr tablet TAKE 1 TABLET BY MOUTH TWICE A DAY WITH MEAL   metoprolol succinate (TOPROL-XL) 100 MG 24 hr tablet TAKE 1 TABLET BY MOUTH DAILY. TAKE WITH OR IMMEDIATELY FOLLOWING A MEAL   mupirocin ointment (BACTROBAN) 2 % Apply topically 3 (three) times daily.   omeprazole (PRILOSEC) 20 MG capsule TAKE 1 CAPSULE BY MOUTH TWICE A DAY   potassium chloride (KLOR-CON M10) 10 MEQ tablet TAKE 1 TABLET BY MOUTH EVERY DAY   promethazine (PHENERGAN) 25 MG tablet Take 1 tablet (25 mg total) by mouth every 6 (six) hours as needed for nausea.   Semaglutide,0.25 or 0.5MG /DOS, (OZEMPIC, 0.25 OR 0.5 MG/DOSE,) 2 MG/1.5ML SOPN Inject 0.5 mg into the skin once a week.   sulfamethoxazole-trimethoprim (BACTRIM DS) 800-160 MG tablet Take 1 tablet by mouth 2 (two) times daily.   temazepam (RESTORIL) 30 MG capsule Take 1 capsule (30 mg total) by mouth at bedtime as needed for sleep.   traMADol (ULTRAM) 50 MG tablet TAKE TWO TABLETS BY MOUTH THREE TIMES DAILY AS NEEDED FOR moderate pain   triamcinolone cream (KENALOG) 0.1 % Apply 1 application topically 2 (two) times daily.   venlafaxine XR (EFFEXOR-XR) 150 MG 24 hr capsule TAKE 1 CAPSULE (150 MG TOTAL)  BY MOUTH DAILY WITH BREAKFAST.   vitamin B-12 (CYANOCOBALAMIN) 1000 MCG tablet Take 1,000 mcg by mouth daily. 2500 mcg once daily   vitamin E (VITAMIN E) 180 MG (400 UNITS) capsule Take 400 Units by mouth daily.   [DISCONTINUED] potassium chloride (KLOR-CON 10) 10 MEQ CR tablet Take 1 tablet (10 mEq total) by mouth daily.   No facility-administered encounter medications on file as of 04/09/2020.   Reviewed chart for medication changes ahead of medication coordination call. No medication changes indicated.  BP Readings from Last 3 Encounters:  02/10/20 (!) 150/90  11/07/19 128/78  08/03/19 140/80    Lab Results  Component Value Date   HGBA1C 7.9 (A) 02/10/2020    Patient obtains medications through Vials  90 Days  Last adherence delivery included:  Levothyroxine (SYNTHROID) 50 mcg: take 1 tablet by mouth daily  Atorvastatin (LIPITOR) 10 mg: take 1 tablet by mouth daily. Sulfamethoxazole-trimethoprim (BACTRIM DS) 800-160 MG take 1 tablet by mouth 2 (two) times daily Gabapentin (NEURONTIN) 300 MG take 2 capsules (600 mg total) by mouth 3 times daily  Patient declined the following last month due to PRN use/additional supply on hand. Tramadol (Ultram) 50 mg: take two tablets by mouth 3 times daily as needed. Diltiazem (CARDIZEM CD) 360 MG 24 hr: take one capsule (360 mg total) by mouth daily. Metoprolol succinate (TOPROL-XL) 100 MG 24 hr take 1 tablet by mouth immediately following meals Omeprazole (PRILOSEC) 20 MG capsule take one capsule by mouth twice a day  Cyclobenzaprine (FLEXERIL) 10 mg: take one tablet by mouth three times a day as needed for muscle spasms Temazepam (RESTORIL) 30 mg: take one capsule (30 mg total) by mouth at bedtime as needed for sleep. Potassium chloride (KLOR-CON 10) 10 MEQ CR: one tablet daily Fenofibrate 160 mg: one tablet daily Venlafaxine XR 150 mg 24 hr: one capsule every morning Diclofenac (VOLTAREN) 75 mg: one tablet by mouth twice a day Metformin (GLUCOPHAGE-XR) 500 MG 24 hr: One tablet twice a day Semaglutide (OZEMPIC, 0.25 OR 0.5 MG/DOSE,) 2 MG/1.5ML SOPN  Patient is due for next adherence delivery on: 04/13/2020. Called patient and reviewed medications and coordinated delivery. This delivery to include: Tramadol (Ultram) 50 mg: take two tablets by mouth 3 times daily as needed.  I spoke with the patient and review medications. There are no changes in medications currently. Patient declined these medication this month due to PRN use/additional supply on hand.The patient is taking the following medications: Last filled 90 Supply 02/10/2020 Metoprolol succinate (TOPROL-XL) 100 MG 24 hr take 1 tablet by mouth  immediately following meals Potassium chloride (KLOR-CON 10) 10 MEQ CR: one tablet daily Fenofibrate 160 mg: one tablet daily Venlafaxine XR 150 mg 24 hr: one capsule every morning Lasted filled 03/14/2020  Levothyroxine (SYNTHROID) 50 mcg: take 1 tablet by mouth daily   Atorvastatin (LIPITOR) 10 mg: take 1 tablet by mouth daily.  Sulfamethoxazole-trimethoprim (BACTRIM DS) 800-160 MG take 1 tablet by mouth 2 (two) times daily  Gabapentin (NEURONTIN) 300 MG take 2 capsules (600 mg total) by mouth 3 times daily  She currently does not need refills.  Star Rating Drugs:  Dispensed Quantity Pharmacy  Metformin XR 500 mg  12/03/2019 [facility]  Atorvastatin 10 mg 03/14/2020 [facility]     Amilia Revonda Standard, [place] Assistant [phone]

## 2020-04-26 ENCOUNTER — Telehealth: Payer: Self-pay | Admitting: Pharmacist

## 2020-04-26 NOTE — Chronic Care Management (AMB) (Signed)
I left the patient a message about her upcoming appointment on 04/27/2020 @ 1:15 pm with the clinical pharmacist. She was asked to please have all medication on hand to review with the pharmacist.   Christina [name]) Mare Ferrari, [place] [phone]

## 2020-04-26 NOTE — Chronic Care Management (AMB) (Signed)
Chronic Care Management Pharmacy Assistant   Name: Christina [name]  MRN: [id number] DOB: 06-26-1965  Reason for Encounter: Medication Review -Medication coordination call   Recent office visits:  None  Recent consult visits:  None  Hospital visits:  None in previous 6 months  Medications: Outpatient Encounter Medications as of 04/26/2020  Medication Sig  . aspirin 81 MG EC tablet Take 81 mg by mouth daily.    [name] atorvastatin (LIPITOR) 10 MG tablet TAKE 1 TABLET BY MOUTH EVERY DAY  . cephALEXin (KEFLEX) 500 MG capsule TAKE 1 CAPSULE BY MOUTH THREE TIMES A DAY  . cholecalciferol (VITAMIN D) 1000 UNITS tablet Take 1,000 Units by mouth daily.  . Cinnamon 500 MG TABS Take 2 tablets by mouth daily.  . clobetasol (TEMOVATE) 0.05 % external solution Apply 1 application topically 2 (two) times daily.  . [facility]S ULTRA THIN LANCETS MISC 1 strip by Does not apply route as directed.  . Cyanocobalamin (VITAMIN B 12 PO) Take 1,000 Units by mouth daily.  . cyclobenzaprine (FLEXERIL) 10 MG tablet TAKE 1 TABLET BY MOUTH THREE TIMES A DAY AS NEEDED FOR MUSCLE SPASMS  . desonide (DESOWEN) 0.05 % cream Apply topically 2 (two) times daily.    . diclofenac (VOLTAREN) 75 MG EC tablet Take 1 tablet (75 mg total) by mouth 2 (two) times daily.  [name] diltiazem (CARDIZEM CD) 360 MG 24 hr capsule Take 1 capsule (360 mg total) by mouth daily.  . diphenhydrAMINE (BENADRYL) 25 mg capsule Take 25 mg by mouth as needed.    [name] EPINEPHrine 0.3 mg/0.3 mL IJ SOAJ injection Inject 0.3 mg into the muscle as needed for anaphylaxis.  . fenofibrate 160 MG tablet TAKE 1 TABLET BY MOUTH DAILY  . gabapentin (NEURONTIN) 300 MG capsule TAKE 2 CAPSULES (600 MG TOTAL) BY MOUTH 3 (THREE) TIMES DAILY.  [name] glucose blood (TRUETEST TEST) test strip Use as instructed  . hydrochlorothiazide (HYDRODIURIL) 25 MG tablet TAKE 1 TABLET BY MOUTH EVERY DAY  . HYDROmorphone (DILAUDID) 4 MG tablet Take 1 tablet (4 mg total) by mouth every 6 (six) hours as  needed for severe pain.  [name] levothyroxine (SYNTHROID) 50 MCG tablet TAKE 1 TABLET (50 MCG TOTAL) BY MOUTH DAILY.  . metFORMIN (GLUCOPHAGE-XR) 500 MG 24 hr tablet TAKE 1 TABLET BY MOUTH TWICE A DAY WITH MEAL  . metoprolol succinate (TOPROL-XL) 100 MG 24 hr tablet TAKE 1 TABLET BY MOUTH DAILY. TAKE WITH OR IMMEDIATELY FOLLOWING A MEAL  . mupirocin ointment (BACTROBAN) 2 % Apply topically 3 (three) times daily.  [name] omeprazole (PRILOSEC) 20 MG capsule TAKE 1 CAPSULE BY MOUTH TWICE A DAY  . potassium chloride (KLOR-CON M10) 10 MEQ tablet TAKE 1 TABLET BY MOUTH EVERY DAY  . promethazine (PHENERGAN) 25 MG tablet Take 1 tablet (25 mg total) by mouth every 6 (six) hours as needed for nausea.  . Semaglutide,0.25 or 0.5MG /DOS, (OZEMPIC, 0.25 OR 0.5 MG/DOSE,) 2 MG/1.5ML SOPN Inject 0.5 mg into the skin once a week.  . sulfamethoxazole-trimethoprim (BACTRIM DS) 800-160 MG tablet Take 1 tablet by mouth 2 (two) times daily.  . temazepam (RESTORIL) 30 MG capsule Take 1 capsule (30 mg total) by mouth at bedtime as needed for sleep.  . traMADol (ULTRAM) 50 MG tablet TAKE TWO TABLETS BY MOUTH THREE TIMES DAILY AS NEEDED FOR moderate pain  . triamcinolone cream (KENALOG) 0.1 % Apply 1 application topically 2 (two) times daily.  [name] venlafaxine XR (EFFEXOR-XR) 150 MG 24 hr capsule TAKE 1 CAPSULE (  150 MG TOTAL) BY MOUTH DAILY WITH BREAKFAST.  [name] vitamin B-12 (CYANOCOBALAMIN) 1000 MCG tablet Take 1,000 mcg by mouth daily. 2500 mcg once daily  . vitamin E (VITAMIN E) 180 MG (400 UNITS) capsule Take 400 Units by mouth daily.  . [DISCONTINUED] potassium chloride (KLOR-CON 10) 10 MEQ CR tablet Take 1 tablet (10 mEq total) by mouth daily.   No facility-administered encounter medications on file as of 04/26/2020.   Received call from patient regarding medication management via [facility].  Patient requested an acute fill for the following medication   Fenofibrate 160 mg: one tablet daily Venlafaxine Er 150 mg: one  capsule daily  to be delivered: 04.01.2022 Pharmacy needs refills? No  Confirmed delivery date of 04.01.2022, advised patient that pharmacy will contact them the morning of delivery.  Christina [name], [place] [phone]

## 2020-04-27 ENCOUNTER — Ambulatory Visit (INDEPENDENT_AMBULATORY_CARE_PROVIDER_SITE_OTHER): Payer: PPO | Admitting: Pharmacist

## 2020-04-27 DIAGNOSIS — I10 Essential (primary) hypertension: Secondary | ICD-10-CM

## 2020-04-27 DIAGNOSIS — E785 Hyperlipidemia, unspecified: Secondary | ICD-10-CM | POA: Diagnosis not present

## 2020-04-27 NOTE — Progress Notes (Signed)
Chronic Care Management Pharmacy Note  05/04/2020 Name:  Christina [name] MRN:  [id number] DOB:  10-Jul-1965  Subjective: Christina [name] is an 55 y.o. year old female who is a primary patient of Laurey Morale, MD.  The CCM team was consulted for assistance with disease management and care coordination needs.    Engaged with patient by telephone for follow up visit in response to provider referral for pharmacy case management and/or care coordination services.   Consent to Services:  The patient was given information about Chronic Care Management services, agreed to services, and gave verbal consent prior to initiation of services.  Please see initial visit note for detailed documentation.   Patient Care Team: Laurey Morale, MD as PCP - General Viona Gilmore, [facility] as Pharmacist (Pharmacist)  Recent office visits: 02/10/20 Christina Penna, MD: Patient presented for DM follow up. A1c decreased to 7.9% and triglycerides increased. Increased Ozempic to 1 mg weekly.  08/03/2019- Christina Penna, MD- Patient presented for office visit for BP and depression/anxiety. BP and BGs improved. Ozempic increased to 60m weekly. Patient to continue Effexor XR 1571mdaily. Patient to follow up in 1 month.   Recent consult visits: 02/18/2018- Orthopedics- [facility]/p achilles tendon rupture. Patient to cover wound with mupirocin. Patient to use cam walker as opposed to barefoot. Follow up in 3 weeks for recheck.   Hospital visits: 01/15/20 Patient presented to the ED for COVID infection.  Objective:  Lab Results  Component Value Date   CREATININE 1.05 02/10/2020   BUN 21 02/10/2020   GFR 46.25 (L) 05/02/2019   GFRNONAA 94.60 09/11/2009   GFRAA  05/03/2009    >60        The eGFR has been calculated using the MDRD equation. This calculation has not been validated in all clinical situations. eGFR's persistently <60 mL/min signify possible Chronic Kidney Disease.   NA  138 02/10/2020   K 4.4 02/10/2020   CALCIUM 9.4 02/10/2020   CO2 27 02/10/2020   GLUCOSE 163 (H) 02/10/2020    Lab Results  Component Value Date/Time   HGBA1C 7.9 (A) 02/10/2020 05:24 PM   HGBA1C 9.3 (H) 05/02/2019 02:51 PM   HGBA1C 7.9 (A) 11/30/2018 02:11 PM   HGBA1C 7.5 (H) 03/17/2018 04:39 PM   GFR 46.25 (L) 05/02/2019 02:51 PM   GFR 48.28 (L) 03/17/2018 04:39 PM   MICROALBUR 2.5 (H) 02/21/2014 02:22 PM   MICROALBUR 1.7 10/05/2013 10:42 AM    Last diabetic Eye exam: No results found for: HMDIABEYEEXA  Last diabetic Foot exam: No results found for: HMDIABFOOTEX   Lab Results  Component Value Date   CHOL 198 02/10/2020   HDL 52 02/10/2020   LDLCALC 97 02/10/2020   LDLDIRECT 107.0 05/02/2019   TRIG 366 (H) 02/10/2020   CHOLHDL 3.8 02/10/2020    Hepatic Function Latest Ref Rng & Units 02/10/2020 05/02/2019 03/17/2018  Total Protein 6.1 - 8.1 g/dL 7.1 7.1 7.0  Albumin 3.5 - 5.2 g/dL - 4.6 4.4  AST 10 - 35 U/L _0 ALT 6 - 29 U/L _1 Alk Phosphatase 39 - 117 U/L - 48 50  Total Bilirubin 0.2 - 1.2 mg/dL 0.4 0.4 0.3  Bilirubin, Direct 0.0 - 0.2 mg/dL 0.1 0.1 0.1    Lab Results  Component Value Date/Time   TSH 2.40 02/10/2020 03:35 PM   TSH 1.91 05/02/2019 02:51 PM   FREET4 1.2 02/10/2020 03:35 PM  FREET4 0.76 05/02/2019 02:51 PM    CBC Latest Ref Rng & Units 02/10/2020 05/02/2019 03/17/2018  WBC 3.8 - 10.8 Thousand/uL 7.0 5.5 5.3  Hemoglobin 11.7 - 15.5 g/dL 12.1 12.3 11.5(L)  Hematocrit 35.0 - 45.0 % 36.5 37.8 34.9(L)  Platelets 140 - 400 Thousand/uL 184 145.0(L) 173.0    No results found for: VD25OH  Clinical ASCVD: No  The 10-year ASCVD risk score Mikey Bussing DC Jr., et al., 2013) is: 7.3%   Values used to calculate the score:     Age: 14 years     Sex: Female     Is Non-Hispanic African American: No     Diabetic: Yes     Tobacco smoker: No     Systolic Blood Pressure: 254 mmHg     Is BP treated: Yes     HDL Cholesterol: 52 mg/dL     Total  Cholesterol: 198 mg/dL    Depression screen [facility] 2/9 07/06/2019 05/25/2014  Decreased Interest 2 0  Down, Depressed, Hopeless 2 0  PHQ - 2 Score 4 0  Altered sleeping 0 -  Tired, decreased energy 2 -  Change in appetite 1 -  Feeling bad or failure about yourself  2 -  Trouble concentrating 2 -  Moving slowly or fidgety/restless 2 -  Suicidal thoughts 1 -  PHQ-9 Score 14 -  Difficult doing work/chores Somewhat difficult -      Social History   Tobacco Use  Smoking Status Former Smoker  Smokeless Tobacco Never Used   BP Readings from Last 3 Encounters:  02/10/20 (!) 150/90  11/07/19 128/78  08/03/19 140/80   Pulse Readings from Last 3 Encounters:  02/10/20 81  11/07/19 92  08/03/19 81   Wt Readings from Last 3 Encounters:  02/10/20 212 lb 6.4 oz (96.3 kg)  11/07/19 210 lb 6.4 oz (95.4 kg)  05/02/19 218 lb 6.4 oz (99.1 kg)   BMI Readings from Last 3 Encounters:  02/10/20 38.23 kg/m  11/07/19 38.48 kg/m  05/02/19 39.31 kg/m    Assessment/Interventions: Review of patient past medical history, allergies, medications, health status, including review of consultants reports, laboratory and other test data, was performed as part of comprehensive evaluation and provision of chronic care management services.   SDOH:  (Social Determinants of Health) assessments and interventions performed: Yes  SDOH Screenings   Alcohol Screen: Not on file  Depression (PHQ2-9): Medium Risk  . PHQ-2 Score: 14  Financial Resource Strain: Medium Risk  . Difficulty of Paying Living Expenses: Somewhat hard  Food Insecurity: Not on file  Housing: Not on file  Physical Activity: Not on file  Social Connections: Not on file  Stress: Not on file  Tobacco Use: Medium Risk  . Smoking Tobacco Use: Former Smoker  . Smokeless Tobacco Use: Never Used  Transportation Needs: No Transportation Needs  . Lack of Transportation (Medical): No  . Lack of Transportation (Non-Medical): No    CCM Care  Plan  Allergies  Allergen Reactions  . Albuterol   . Antihistamines, Loratadine-Type   . Bee Venom   . Clindamycin/Lincomycin   . Codeine   . Doxycycline   . Lisinopril     REACTION: facial swelling  . Metronidazole     REACTION: rash  . Ondansetron Hcl     REACTION: rash  . Oxycodone-Acetaminophen   . Tylox [Oxycodone-Acetaminophen]   . Vancomycin     REACTION: rash  . Benadryl [Diphenhydramine] Nausea And Vomiting  . Lidocaine Rash    Medications  Reviewed Today    Reviewed by Laurey Morale, MD (Physician) on 02/10/20 at 1455  Med List Status: <None>  Medication Order Taking? Sig Documenting Provider Last Dose Status Informant  aspirin 81 MG EC tablet [id number] No Take 81 mg by mouth daily.   [provider] Taking Active   atorvastatin (LIPITOR) 10 MG tablet [id number] No TAKE 1 TABLET BY MOUTH EVERY DAY Laurey Morale, MD Taking Active   cephALEXin (KEFLEX) 500 MG capsule [id number] No TAKE 1 CAPSULE BY MOUTH THREE TIMES A DAY Laurey Morale, MD Taking Active   cholecalciferol (VITAMIN D) 1000 UNITS tablet [id number] No Take 1,000 Units by mouth daily. [provider] Taking Active   Cinnamon 500 MG TABS [id number] No Take 2 tablets by mouth daily. [provider] Taking Active   clobetasol (TEMOVATE) 0.05 % external solution [id number] No Apply 1 application topically 2 (two) times daily. Laurey Morale, MD Taking Active   [facility] ULTRA THIN LANCETS [place] [id number] No 1 strip by Does not apply route as directed. Laurey Morale, MD Taking Active   Cyanocobalamin (VITAMIN B 12 PO) [id number] No Take 1,000 Units by mouth daily. [provider] Taking Active   cyclobenzaprine (FLEXERIL) 10 MG tablet [id number] No TAKE 1 TABLET BY MOUTH THREE TIMES A DAY AS NEEDED FOR MUSCLE SPASMS Laurey Morale, MD Taking Active   desonide (DESOWEN) 0.05 % cream [id number] No Apply topically 2 (two) times daily.   [provider] Taking Active   diclofenac  (VOLTAREN) 75 MG EC tablet [id number]  Take 1 tablet (75 mg total) by mouth 2 (two) times daily. Laurey Morale, MD  Active   diltiazem (CARDIZEM CD) 360 MG 24 hr capsule [id number] No Take 1 capsule (360 mg total) by mouth daily. Laurey Morale, MD Taking Active   diphenhydrAMINE (BENADRYL) 25 mg capsule [id number] No Take 25 mg by mouth as needed.   [provider] Taking Active   EPINEPHrine 0.3 mg/0.3 mL IJ SOAJ injection [id number]  Inject 0.3 mg into the muscle as needed for anaphylaxis. Laurey Morale, MD  Active   fenofibrate 160 MG tablet [id number] No TAKE 1 TABLET BY MOUTH DAILY Laurey Morale, MD Taking Active   gabapentin (NEURONTIN) 300 MG capsule [id number]  TAKE 2 CAPSULES (600 MG TOTAL) BY MOUTH 3 (THREE) TIMES DAILY. Laurey Morale, MD  Active   glucose blood (TRUETEST TEST) test strip [id number] No Use as instructed Laurey Morale, MD Taking Active   hydrochlorothiazide (HYDRODIURIL) 25 MG tablet [id number] No TAKE 1 TABLET BY MOUTH EVERY DAY Laurey Morale, MD Taking Active   HYDROmorphone (DILAUDID) 4 MG tablet [id number] No Take 1 tablet (4 mg total) by mouth every 6 (six) hours as needed for severe pain. Laurey Morale, MD Taking Active   levothyroxine (SYNTHROID) 50 MCG tablet [id number] No TAKE 1 TABLET (50 MCG TOTAL) BY MOUTH DAILY. Laurey Morale, MD Taking Active   metFORMIN (GLUCOPHAGE-XR) 500 MG 24 hr tablet [id number]  TAKE 1 TABLET BY MOUTH TWICE A DAY WITH MEAL Laurey Morale, MD  Active   metoprolol succinate (TOPROL-XL) 100 MG 24 hr tablet [id number] No TAKE 1 TABLET BY MOUTH DAILY. TAKE WITH OR IMMEDIATELY FOLLOWING A MEAL Laurey Morale, MD Taking Active   mupirocin ointment (BACTROBAN) 2 % [id number] No Apply topically 3 (three) times daily. Laurey Morale, MD Taking Active   omeprazole (PRILOSEC) 20 MG capsule [id number] No TAKE 1 CAPSULE BY  MOUTH TWICE A DAY Laurey Morale, MD Taking Active         Discontinued 09/22/11 [phone] (Reorder)   potassium chloride (KLOR-CON  M10) 10 MEQ tablet [id number] No TAKE 1 TABLET BY MOUTH EVERY DAY Laurey Morale, MD Taking Active   promethazine (PHENERGAN) 25 MG tablet [id number] No Take 1 tablet (25 mg total) by mouth every 6 (six) hours as needed for nausea. Laurey Morale, MD Taking Active   Semaglutide,0.25 or 0.5MG/DOS, (OZEMPIC, 0.25 OR 0.5 MG/DOSE,) 2 MG/1.5ML SOPN [id number]  Inject 0.5 mg into the skin once a week. Laurey Morale, MD  Active   sulfamethoxazole-trimethoprim (BACTRIM DS) 800-160 MG tablet [id number] No Take 1 tablet by mouth 2 (two) times daily. Laurey Morale, MD Taking Active   temazepam (RESTORIL) 30 MG capsule [id number] No Take 1 capsule (30 mg total) by mouth at bedtime as needed for sleep. Laurey Morale, MD Taking Active   traMADol Veatrice Bourbon) 50 MG tablet [id number]  TAKE TWO TABLETS BY MOUTH THREE TIMES DAILY AS NEEDED FOR moderate pain Laurey Morale, MD  Active   triamcinolone cream (KENALOG) 0.1 % [id number] No Apply 1 application topically 2 (two) times daily. Laurey Morale, MD Taking Active   venlafaxine XR (EFFEXOR-XR) 150 MG 24 hr capsule [id number] No TAKE 1 CAPSULE (150 MG TOTAL) BY MOUTH DAILY WITH BREAKFAST. Laurey Morale, MD Taking Active   vitamin B-12 (CYANOCOBALAMIN) 1000 MCG tablet [id number] No Take 1,000 mcg by mouth daily. 2500 mcg once daily [provider] Taking Active Self  vitamin E (VITAMIN E) 180 MG (400 UNITS) capsule [id number] No Take 400 Units by mouth daily. [provider] Taking Active Self          Patient Active Problem List   Diagnosis Date Noted  . Hyperlipidemia   . Depression with anxiety 07/13/2019  . S/P Achilles tendon repair 02/18/2018  . Psoriasis 10/29/2016  . Morbid obesity ([place]) 01/04/2016  . Hypothyroidism 04/05/2014  . Type 2 diabetes mellitus, uncontrolled ([place]) 03/06/2014  . NECK PAIN 11/28/2009  . BRACHIAL NEURITIS OR RADICULITIS NOS 11/28/2009  . B12 deficiency 09/14/2009  . HYPERTRIGLYCERIDEMIA 09/14/2009  . Diabetic  neuropathy ([place]) 09/11/2009  . ECZEMA 06/07/2009  . ALLERGIC URTICARIA 05/02/2009  . ANGIOEDEMA 05/02/2009  . SKIN LESION 11/27/2008  . Anxiety state 07/26/2008  . ABDOMINAL PAIN, GENERALIZED 04/03/2008  . [place]inder DISEASE, LUMBAR 06/22/2007  . ARTHRITIS 06/11/2007  . LOW BACK PAIN 12/28/2006  . Essential hypertension 09/11/2006  . Asthma 09/11/2006  . GERD 09/11/2006  . History of colonic polyps 09/11/2006    Immunization History  Administered Date(s) Administered  . Influenza Whole 12/27/2004, 11/27/2008  . Influenza,inj,Quad PF,6+ Mos 10/08/2012, 10/05/2013, 10/06/2014, 01/04/2016, 10/29/2016, 12/23/2017, 11/30/2018, 11/07/2019  . PFIZER(Purple Top)SARS-COV-2 Vaccination 05/20/2019, 06/15/2019  . Pneumococcal Polysaccharide-23 01/16/2020  . Tdap 04/01/2017    Conditions to be addressed/monitored:  Hypertension, Hyperlipidemia, Diabetes, GERD, Hypothyroidism, Anxiety, Osteoarthritis and insomnia, vitamin B12 deficiency  Care Plan : CCM Pharmacy Care Plan  Updates made by Viona Gilmore, [place] since 05/04/2020 12:00 AM    Problem: Problem: Hypertension, Hyperlipidemia, Diabetes, GERD, Hypothyroidism, Anxiety, Osteoarthritis and insomnia, vitamin B12 deficiency     Long-Range Goal: Patient-Specific Goal   Start Date: 04/27/2020  Expected End Date: 04/27/2021  This Visit's Progress: On track  Priority: High  Note:   Current Barriers:  . Unable to independently monitor therapeutic efficacy . Unable to achieve control of diabetes  . Suboptimal therapeutic regimen for cholesterol  Pharmacist Clinical Goal(s):  [name] Patient will achieve adherence to monitoring guidelines and medication adherence to achieve therapeutic efficacy . achieve control of diabetes as evidenced by A1c  through collaboration with PharmD and provider.   Interventions: . 1:1 collaboration with Laurey Morale, MD regarding development and update of comprehensive plan of care as evidenced by provider attestation  and co-signature . Inter-disciplinary care team collaboration (see longitudinal plan of care) . Comprehensive medication review performed; medication list updated in electronic medical record  Hypertension (BP goal <140/90) -Uncontrolled -Current treatment:  diltiazem (Cardizem CD) 326m, 1 capsule daily - in PM  metoprolol succinate 108monce daily  - in AM -Medications previously tried: lisinopril (facial swelling) metoprolol succinate 10035mHCTZ 86m32matient reports these were stopped when she started diltiazem) -Current home readings: 189/104, 153/90 (Saturday) -Current dietary habits: did not discuss -Current exercise habits: no structured exercise; limited in mobility -Reports hypotensive/hypertensive symptoms -Educated on BP goals and benefits of medications for prevention of heart attack, stroke and kidney damage; Daily salt intake goal < 2300 mg; Exercise goal of 150 minutes per week; Importance of home blood pressure monitoring; Proper BP monitoring technique; -Counseled to monitor BP at home a few times weekly, document, and provide log at future appointments -Counseled on diet and exercise extensively Recommended to continue current medication Arranged for pharmacy delivery of metoprolol as patient may have accidentally tossed hers in the trash  Hyperlipidemia: (LDL goal < 100) -Controlled -Current treatment:  atorvastatin 10mg35mtablet daily   fenofibrate 160mg,27mablet daily -Medications previously tried: none  -Current dietary patterns: patient is no longer eating red meat -Current exercise habits: limited in mobility -Educated on Cholesterol goals;  Benefits of statin for ASCVD risk reduction; Importance of limiting foods high in cholesterol; Exercise goal of 150 minutes per week; -Counseled on diet and exercise extensively Recommended to continue current medication Collaborated with PCP on management of triglycerides with additional medication  adjustment  Diabetes (A1c goal <7%) -Uncontrolled -Current medications:  semaglutide (Ozempic) 1 mg once a week   metformin XR 500mg 187mlet twice daily  -Medications previously tried: glipizide  -Current home glucose readings . fasting glucose: could not provide numbers . post prandial glucose: could not provide numbers -Denies hypoglycemic/hyperglycemic symptoms -Current meal patterns:  . breakfast: cinnamon poptart  . lunch: did not discuss . dinner: did not discuss . snacks: did not discuss . drinks: half and half tea, water -Current exercise: limited in mobility -Educated on A1c and blood sugar goals; Exercise goal of 150 minutes per week; Benefits of routine self-monitoring of blood sugar; Carbohydrate counting and/or plate method -Counseled to check feet daily and get yearly eye exams -Counseled on diet and exercise extensively Recommended to continue current medication  Depression/Anxiety (Goal: minimize symptoms) -Controlled -Current treatment: . venlafaxine XR 150mg, 121msule one daily with breakfast  -Medications previously tried/failed: sertraline -PHQ9: 14  -GAD7: n/a -Educated on Benefits of medication for symptom control -Recommended to continue current medication  Insomnia (Goal: improve quality and quantity of sleep) -Controlled -Current treatment   temazepam 30mg, 1 85mule at bedtime as needed for sleep  Melatonin 10 mg, 1 capsule at bedtime  -Medications previously tried: n/a  -Counseled on non-pharmacological interventions for insomnia. (Avoid napping, limit exposure to technology near bedtime, etc). ; discussed trying 5 mg dose instead of 10 mg and stressed the importance of timed release to help with staying asleep  Hypothyroidism (Goal: TSH 0.35-4.5) -Controlled -Current treatment  . levothyroxine 50mcg, 173m  tablet daily -Medications previously tried: none  -Recommended to continue current medication  Arthritis/muscle spasms (Goal:  minimize pain) -Controlled -Current treatment   diclofenac 4108m, 1 tablet twice daily - tries to not take scheduled   tramadol 52m 2 tablets three times daily as needed for moderate pain   hydromorphone 108m59m1 tablet every six hours as needed for severe pain    cyclobenzaprine 47m26m tablet three times daily as needed for muscle spasms   benadryl 25 mg - takes this only with the hydromorphone  Voltaren gel (diclofenac) as needed -Medications previously tried: Opana (oxymorphone) -Recommended to continue current medication  Vitamin B12 deficiency (Goal: 211-911) -Not ideally controlled -Current treatment  . Vitamin B12 1000 mcg 1 tablet daily -Medications previously tried: none  -Recommended decreasing to every other day and then buying 500 mcg to take daily to avoid oversupplementation  GERD (Goal: minimize symptoms) -Controlled -Current treatment  . omeprazole 20mg43mcapsule twice daily -Medications previously tried: none  -Counseled on non-pharmacological interventions for acid reflux. Take measures to prevent acid reflux, such as avoiding spicy foods, avoiding caffeine, avoid laying down a few hours after eating, and raising the head of the bed.  Neuropathy (Goal: minimize pain) -Controlled -Current treatment  . gabapentin 300mg,15mapsules three times daily  -Medications previously tried: none  -Recommended to continue current medication  Skin lesions (Goal: minimize symptoms) -Controlled -Current treatment   mupirocin 2% ointment, apply three times daily   Bactrim DS, 1 tablet twice daily  triamcinolone 0.1% cream, apply twice daily -Medications previously tried: none  -Recommended to continue current medication   Health Maintenance -Vaccine gaps: shingles, COVID booster -Current therapy:   Biotin 5000 mcg (collagen support) 1 tablet daily  Aspirin 81 mg 1 tablet daily -Educated on Cost vs benefit of each product must be carefully weighed by  individual consumer -Patient is satisfied with current therapy and denies issues -Recommended to continue current medication  Patient Goals/Self-Care Activities . Patient will:  - take medications as prescribed check glucose daily, document, and provide at future appointments check blood pressure a few times a week, document, and provide at future appointments engage in dietary modifications by adding more vegetables to her diet  Follow Up Plan: Telephone follow up appointment with care management team member scheduled for: 2 months      Medication Assistance: Ozempic obtained through [employer] medication assistance program.  Enrollment ends 01/26/21  Patient's preferred pharmacy is:  [facility] [place] [address] [place] [address]: [phone] pill box? Yes - in AM/PM - night pills in old pill bottles (10 bottles), keeping other pills (out of sight) Pt endorses 100% compliance  We discussed: Benefits of medication synchronization, packaging and delivery as well as enhanced pharmacist oversight with [facility]. Patient decided to: Utilize [facility] pharmacy for medication synchronization, packaging and delivery  Care Plan and Follow Up Patient Decision:  Patient agrees to Care Plan and Follow-up.  Plan: Telephone follow up appointment with care management team member scheduled for:  2 months  MadeliJeni SallesmD BCACP [facility] Pharmacist [phone] month  - 30 minutes

## 2020-04-29 DIAGNOSIS — T23212A Burn of second degree of left thumb (nail), initial encounter: Secondary | ICD-10-CM | POA: Diagnosis not present

## 2020-04-29 DIAGNOSIS — M79645 Pain in left finger(s): Secondary | ICD-10-CM | POA: Diagnosis not present

## 2020-05-04 NOTE — Patient Instructions (Addendum)
Hi Christina [name],  As always, it was great getting to speak with you! Below is a summary of some of the topics we discussed.   Just a reminder to keep on checking your blood pressure a few times a week and try to check your blood sugars daily as well. Keeping a notebook with the numbers in there would be really helpful for making sure your medications are doing their job.   Please reach out to me if you have any questions or need anything before our follow up!  Best, Maddie  [name], PharmD, [facility]  Visit Information  Goals Addressed   None    Patient Care Plan: CCM Pharmacy Care Plan    Problem Identified: Problem: Hypertension, Hyperlipidemia, Diabetes, GERD, Hypothyroidism, Anxiety, Osteoarthritis and insomnia, vitamin B12 deficiency     Long-Range Goal: Patient-Specific Goal   Start Date: 04/27/2020  Expected End Date: 04/27/2021  This Visit's Progress: On track  Priority: High  Note:   Current Barriers:  . Unable to independently monitor therapeutic efficacy . Unable to achieve control of diabetes  . Suboptimal therapeutic regimen for cholesterol   Pharmacist Clinical Goal(s):  [name] Patient will achieve adherence to monitoring guidelines and medication adherence to achieve therapeutic efficacy . achieve control of diabetes as evidenced by A1c  through collaboration with PharmD and provider.   Interventions: . 1:1 collaboration with Laurey Morale, MD regarding development and update of comprehensive plan of care as evidenced by provider attestation and co-signature . Inter-disciplinary care team collaboration (see longitudinal plan of care) . Comprehensive medication review performed; medication list updated in electronic medical record  Hypertension (BP goal <140/90) -Uncontrolled -Current treatment:  diltiazem (Cardizem CD) 360mg , 1 capsule daily - in PM  metoprolol succinate 100mg  once daily  - in  AM -Medications previously tried: lisinopril (facial swelling) metoprolol succinate 100mg , HCTZ 25mg  (patient reports these were stopped when she started diltiazem) -Current home readings: 189/104, 153/90 (Saturday) -Current dietary habits: did not discuss -Current exercise habits: no structured exercise; limited in mobility -Reports hypotensive/hypertensive symptoms -Educated on BP goals and benefits of medications for prevention of heart attack, stroke and kidney damage; Daily salt intake goal < 2300 mg; Exercise goal of 150 minutes per week; Importance of home blood pressure monitoring; Proper BP monitoring technique; -Counseled to monitor BP at home a few times weekly, document, and provide log at future appointments -Counseled on diet and exercise extensively Recommended to continue current medication Arranged for pharmacy delivery of metoprolol as patient may have accidentally tossed hers in the trash  Hyperlipidemia: (LDL goal < 100) -Controlled -Current treatment:  atorvastatin 10mg , 1 tablet daily   fenofibrate 160mg , 1 tablet daily -Medications previously tried: none  -Current dietary patterns: patient is no longer eating red meat -Current exercise habits: limited in mobility -Educated on Cholesterol goals;  Benefits of statin for ASCVD risk reduction; Importance of limiting foods high in cholesterol; Exercise goal of 150 minutes per week; -Counseled on diet and exercise extensively Recommended to continue current medication Collaborated with PCP on management of triglycerides with additional medication adjustment  Diabetes (A1c goal <7%) -Uncontrolled -Current medications:  semaglutide (Ozempic) 1 mg once a week   metformin XR 500mg  1 tablet twice daily  -Medications previously tried: glipizide  -Current home glucose readings . fasting glucose: could not provide numbers . post prandial glucose: could not provide numbers -Denies hypoglycemic/hyperglycemic  symptoms -Current meal patterns:  . breakfast: cinnamon poptart  . lunch: did  not discuss . dinner: did not discuss . snacks: did not discuss . drinks: half and half tea, water -Current exercise: limited in mobility -Educated on A1c and blood sugar goals; Exercise goal of 150 minutes per week; Benefits of routine self-monitoring of blood sugar; Carbohydrate counting and/or plate method -Counseled to check feet daily and get yearly eye exams -Counseled on diet and exercise extensively Recommended to continue current medication  Depression/Anxiety (Goal: minimize symptoms) -Controlled -Current treatment: . venlafaxine XR 150mg , 1 capsule one daily with breakfast  -Medications previously tried/failed: sertraline -PHQ9: 14  -GAD7: n/a -Educated on Benefits of medication for symptom control -Recommended to continue current medication  Insomnia (Goal: improve quality and quantity of sleep) -Controlled -Current treatment   temazepam 30mg , 1 capsule at bedtime as needed for sleep  Melatonin 10 mg, 1 capsule at bedtime  -Medications previously tried: n/a  -Counseled on non-pharmacological interventions for insomnia. (Avoid napping, limit exposure to technology near bedtime, etc). ; discussed trying 5 mg dose instead of 10 mg and stressed the importance of timed release to help with staying asleep  Hypothyroidism (Goal: TSH 0.35-4.5) -Controlled -Current treatment  . levothyroxine 65mcg, 1 tablet daily -Medications previously tried: none  -Recommended to continue current medication  Arthritis/muscle spasms (Goal: minimize pain) -Controlled -Current treatment   diclofenac 75mg , 1 tablet twice daily - tries to not take scheduled   tramadol 50mg , 2 tablets three times daily as needed for moderate pain   hydromorphone 4mg , 1 tablet every six hours as needed for severe pain    cyclobenzaprine 10mg , 1 tablet three times daily as needed for muscle spasms   benadryl 25 mg - takes  this only with the hydromorphone  Voltaren gel (diclofenac) as needed -Medications previously tried: Opana (oxymorphone) -Recommended to continue current medication  Vitamin B12 deficiency (Goal: 211-911) -Not ideally controlled -Current treatment  . Vitamin B12 1000 mcg 1 tablet daily -Medications previously tried: none  -Recommended decreasing to every other day and then buying 500 mcg to take daily to avoid oversupplementation  GERD (Goal: minimize symptoms) -Controlled -Current treatment  . omeprazole 20mg , 1 capsule twice daily -Medications previously tried: none  -Counseled on non-pharmacological interventions for acid reflux. Take measures to prevent acid reflux, such as avoiding spicy foods, avoiding caffeine, avoid laying down a few hours after eating, and raising the head of the bed.  Neuropathy (Goal: minimize pain) -Controlled -Current treatment  . gabapentin 300mg , 2 capsules three times daily  -Medications previously tried: none  -Recommended to continue current medication  Skin lesions (Goal: minimize symptoms) -Controlled -Current treatment   mupirocin 2% ointment, apply three times daily   Bactrim DS, 1 tablet twice daily  triamcinolone 0.1% cream, apply twice daily -Medications previously tried: none  -Recommended to continue current medication   Health Maintenance -Vaccine gaps: shingles, COVID booster -Current therapy:   Biotin 5000 mcg (collagen support) 1 tablet daily  Aspirin 81 mg 1 tablet daily -Educated on Cost vs benefit of each product must be carefully weighed by individual consumer -Patient is satisfied with current therapy and denies issues -Recommended to continue current medication  Patient Goals/Self-Care Activities . Patient will:  - take medications as prescribed check glucose daily, document, and provide at future appointments check blood pressure a few times a week, document, and provide at future appointments engage in  dietary modifications by adding more vegetables to her diet  Follow Up Plan: Telephone follow up appointment with care management team member scheduled for: 2 months  Patient verbalizes understanding of instructions provided today and agrees to view in [place].  Telephone follow up appointment with pharmacy team member scheduled for:  Viona Gilmore, [facility]  High Triglycerides Eating Plan Triglycerides are a type of fat in the blood. High levels of triglycerides can increase your risk of heart disease and stroke. If your triglyceride levels are high, choosing the right foods can help lower your triglycerides and keep your heart healthy. Work with your health care provider or a diet and nutrition specialist (dietitian) to develop an eating plan that is right for you. What are tips for following this plan? General guidelines  Lose weight, if you are overweight. For most people, losing 5-10 lbs (2-5 kg) helps lower triglyceride levels. A weight-loss plan may include. ? 30 minutes of exercise at least 5 days a week. ? Reducing the amount of calories, sugar, and fat you eat.  Eat a wide variety of fresh fruits, vegetables, and whole grains. These foods are high in fiber.  Eat foods that contain healthy fats, such as fatty fish, nuts, seeds, and olive oil.  Avoid foods that are high in added sugar, added salt (sodium), saturated fat, and trans fat.  Avoid low-fiber, refined carbohydrates such as white bread, crackers, noodles, and white rice.  Avoid foods with partially hydrogenated oils (trans fats), such as fried foods or stick margarine.  Limit alcohol intake to no more than 1 drink a day for nonpregnant women and 2 drinks a day for men. One drink equals 12 oz of beer, 5 oz of wine, or 1 oz of hard liquor. Your health care provider may recommend that you drink less depending on your overall health.   Reading food labels  Check food labels for the amount of saturated fat. Choose  foods with no or very little saturated fat.  Check food labels for the amount of trans fat. Choose foods with no trans fat.  Check food labels for the amount of cholesterol. Choose foods low in cholesterol. Ask your dietitian how much cholesterol you should have each day.  Check food labels for the amount of sodium. Choose foods with less than 140 milligrams (mg) per serving. Shopping  Buy dairy products labeled as nonfat (skim) or low-fat (1%).  Avoid buying processed or prepackaged foods. These are often high in added sugar, sodium, and fat. Cooking  Choose healthy fats when cooking, such as olive oil or canola oil.  Cook foods using lower fat methods, such as baking, broiling, boiling, or grilling.  Make your own sauces, dressings, and marinades when possible, instead of buying them. Store-bought sauces, dressings, and marinades are often high in sodium and sugar. Meal planning  Eat more home-cooked food and less restaurant, buffet, and fast food.  Eat fatty fish at least 2 times each week. Examples of fatty fish include salmon, trout, mackerel, tuna, and herring.  If you eat whole eggs, do not eat more than 3 egg yolks per week. What foods are recommended? The items listed may not be a complete list. Talk with your dietitian about what dietary choices are best for you. Grains Whole wheat or whole grain breads, crackers, cereals, and pasta. Unsweetened oatmeal. Bulgur. Barley. Quinoa. Brown rice. Whole wheat flour tortillas. Vegetables Fresh or frozen vegetables. Low-sodium canned vegetables. Fruits All fresh, canned (in natural juice), or frozen fruits. Meats and other protein foods Skinless chicken or [place]. Ground chicken or [place]. Lean cuts of pork, trimmed of fat. Fish and seafood, especially salmon, trout, and  herring. Egg whites. Dried beans, peas, or lentils. Unsalted nuts or seeds. Unsalted canned beans. Natural peanut or almond butter. Dairy Low-fat dairy products.  Skim or low-fat (1%) milk. Reduced fat (2%) and low-sodium cheese. Low-fat ricotta cheese. Low-fat cottage cheese. Plain, low-fat yogurt. Fats and oils Tub margarine without trans fats. Light or reduced-fat mayonnaise. Light or reduced-fat salad dressings. Avocado. Safflower, olive, sunflower, soybean, and canola oils. What foods are not recommended? The items listed may not be a complete list. Talk with your dietitian about what dietary choices are best for you. Grains White bread. White (regular) pasta. White rice. Cornbread. Bagels. Pastries. Crackers that contain trans fat. Vegetables Creamed or fried vegetables. Vegetables in a cheese sauce. Fruits Sweetened dried fruit. Canned fruit in syrup. Fruit juice. Meats and other protein foods Fatty cuts of meat. Ribs. Chicken wings. Berniece Salines. Sausage. Bologna. Salami. Chitterlings. Fatback. Hot dogs. Bratwurst. Packaged lunch meats. Dairy Whole or reduced-fat (2%) milk. Half-and-half. Cream cheese. Full-fat or sweetened yogurt. Full-fat cheese. Nondairy creamers. Whipped toppings. Processed cheese or cheese spreads. Cheese curds. Beverages Alcohol. Sweetened drinks, such as soda, lemonade, fruit drinks, or punches. Fats and oils Butter. Stick margarine. Lard. Shortening. Ghee. Bacon fat. Tropical oils, such as coconut, palm kernel, or palm oils. Sweets and desserts Corn syrup. Sugars. Honey. Molasses. Candy. Jam and jelly. Syrup. Sweetened cereals. Cookies. Pies. Cakes. Donuts. Muffins. Ice cream. Condiments Store-bought sauces, dressings, and marinades that are high in sugar, such as ketchup and barbecue sauce. Summary  High levels of triglycerides can increase the risk of heart disease and stroke. Choosing the right foods can help lower your triglycerides.  Eat plenty of fresh fruits, vegetables, and whole grains. Choose low-fat dairy and lean meats. Eat fatty fish at least twice a week.  Avoid processed and prepackaged foods with added  sugar, sodium, saturated fat, and trans fat.  If you need suggestions or have questions about what types of food are good for you, talk with your health care provider or a dietitian. This information is not intended to replace advice given to you by your health care provider. Make sure you discuss any questions you have with your health care provider. Document Revised: 05/18/2019 Document Reviewed: 05/18/2019 Elsevier Patient Education  2021 [employer].

## 2020-05-23 ENCOUNTER — Telehealth: Payer: Self-pay | Admitting: Pharmacist

## 2020-05-23 NOTE — Chronic Care Management (AMB) (Signed)
Chronic Care Management Pharmacy Assistant   Name: Christina [name]  MRN: [id number] DOB: December 22, 1965  Reason for Encounter: Disease State   Conditions to be addressed/monitored: HTN   Recent office visits:  None  Recent consult visits:  None  Hospital visits:  None in previous 6 months  Medications: Outpatient Encounter Medications as of 05/23/2020  Medication Sig  . aspirin 81 MG EC tablet Take 81 mg by mouth daily.    [name] atorvastatin (LIPITOR) 10 MG tablet TAKE 1 TABLET BY MOUTH EVERY DAY  . cephALEXin (KEFLEX) 500 MG capsule TAKE 1 CAPSULE BY MOUTH THREE TIMES A DAY  . cholecalciferol (VITAMIN D) 1000 UNITS tablet Take 1,000 Units by mouth daily.  . Cinnamon 500 MG TABS Take 2 tablets by mouth daily.  . clobetasol (TEMOVATE) 0.05 % external solution Apply 1 application topically 2 (two) times daily.  . [facility] ULTRA THIN LANCETS MISC 1 strip by Does not apply route as directed.  . Cyanocobalamin (VITAMIN B 12 PO) Take 1,000 Units by mouth daily.  . cyclobenzaprine (FLEXERIL) 10 MG tablet TAKE 1 TABLET BY MOUTH THREE TIMES A DAY AS NEEDED FOR MUSCLE SPASMS  . desonide (DESOWEN) 0.05 % cream Apply topically 2 (two) times daily.    . diclofenac (VOLTAREN) 75 MG EC tablet Take 1 tablet (75 mg total) by mouth 2 (two) times daily.  [name] diltiazem (CARDIZEM CD) 360 MG 24 hr capsule Take 1 capsule (360 mg total) by mouth daily.  . diphenhydrAMINE (BENADRYL) 25 mg capsule Take 25 mg by mouth as needed.    [name] EPINEPHrine 0.3 mg/0.3 mL IJ SOAJ injection Inject 0.3 mg into the muscle as needed for anaphylaxis.  . fenofibrate 160 MG tablet TAKE 1 TABLET BY MOUTH DAILY  . gabapentin (NEURONTIN) 300 MG capsule TAKE 2 CAPSULES (600 MG TOTAL) BY MOUTH 3 (THREE) TIMES DAILY.  [name] glucose blood (TRUETEST TEST) test strip Use as instructed  . hydrochlorothiazide (HYDRODIURIL) 25 MG tablet TAKE 1 TABLET BY MOUTH EVERY DAY  . HYDROmorphone (DILAUDID) 4 MG tablet Take 1 tablet (4 mg total) by mouth every 6  (six) hours as needed for severe pain.  [name] levothyroxine (SYNTHROID) 50 MCG tablet TAKE 1 TABLET (50 MCG TOTAL) BY MOUTH DAILY.  . metFORMIN (GLUCOPHAGE-XR) 500 MG 24 hr tablet TAKE 1 TABLET BY MOUTH TWICE A DAY WITH MEAL  . metoprolol succinate (TOPROL-XL) 100 MG 24 hr tablet TAKE 1 TABLET BY MOUTH DAILY. TAKE WITH OR IMMEDIATELY FOLLOWING A MEAL  . mupirocin ointment (BACTROBAN) 2 % Apply topically 3 (three) times daily.  [name] omeprazole (PRILOSEC) 20 MG capsule TAKE 1 CAPSULE BY MOUTH TWICE A DAY  . potassium chloride (KLOR-CON M10) 10 MEQ tablet TAKE 1 TABLET BY MOUTH EVERY DAY  . promethazine (PHENERGAN) 25 MG tablet Take 1 tablet (25 mg total) by mouth every 6 (six) hours as needed for nausea.  . Semaglutide,0.25 or 0.5MG/DOS, (OZEMPIC, 0.25 OR 0.5 MG/DOSE,) 2 MG/1.5ML SOPN Inject 0.5 mg into the skin once a week.  . sulfamethoxazole-trimethoprim (BACTRIM DS) 800-160 MG tablet Take 1 tablet by mouth 2 (two) times daily.  . temazepam (RESTORIL) 30 MG capsule Take 1 capsule (30 mg total) by mouth at bedtime as needed for sleep.  . traMADol (ULTRAM) 50 MG tablet TAKE TWO TABLETS BY MOUTH THREE TIMES DAILY AS NEEDED FOR moderate pain  . triamcinolone cream (KENALOG) 0.1 % Apply 1 application topically 2 (two) times daily.  [name] venlafaxine XR (EFFEXOR-XR) 150 MG 24 hr  capsule TAKE 1 CAPSULE (150 MG TOTAL) BY MOUTH DAILY WITH BREAKFAST.  [name] vitamin B-12 (CYANOCOBALAMIN) 1000 MCG tablet Take 1,000 mcg by mouth daily. 2500 mcg once daily  . vitamin E (VITAMIN E) 180 MG (400 UNITS) capsule Take 400 Units by mouth daily.  . [DISCONTINUED] potassium chloride (KLOR-CON 10) 10 MEQ CR tablet Take 1 tablet (10 mEq total) by mouth daily.   No facility-administered encounter medications on file as of 05/23/2020.   Reviewed chart prior to disease state call. Spoke with patient regarding BP  Recent Office Vitals: BP Readings from Last 3 Encounters:  02/10/20 (!) 150/90  11/07/19 128/78  08/03/19 140/80    Pulse Readings from Last 3 Encounters:  02/10/20 81  11/07/19 92  08/03/19 81    Wt Readings from Last 3 Encounters:  02/10/20 212 lb 6.4 oz (96.3 kg)  11/07/19 210 lb 6.4 oz (95.4 kg)  05/02/19 218 lb 6.4 oz (99.1 kg)     Kidney Function Lab Results  Component Value Date/Time   CREATININE 1.05 02/10/2020 03:35 PM   CREATININE 1.21 (H) 05/02/2019 02:51 PM   CREATININE 1.17 03/17/2018 04:39 PM   CREATININE 1.31 (H) 01/04/2016 04:40 PM   GFR 46.25 (L) 05/02/2019 02:51 PM   GFRNONAA 94.60 09/11/2009 10:13 AM   GFRAA  05/03/2009 05:10 AM    >60        The eGFR has been calculated using the MDRD equation. This calculation has not been validated in all clinical situations. eGFR's persistently <60 mL/min signify possible Chronic Kidney Disease.    BMP Latest Ref Rng & Units 02/10/2020 05/02/2019 03/17/2018  Glucose 65 - 99 mg/dL 163(H) 149(H) 113(H)  BUN 7 - 25 mg/dL 21 24(H) 24(H)  Creatinine 0.50 - 1.05 mg/dL 1.05 1.21(H) 1.17  BUN/Creat Ratio 6 - 22 (calc) NOT APPLICABLE - -  Sodium 859 - 146 mmol/L 138 140 142  Potassium 3.5 - 5.3 mmol/L 4.4 4.6 4.4  Chloride 98 - 110 mmol/L 102 99 102  CO2 20 - 32 mmol/L 27 32 32  Calcium 8.6 - 10.4 mg/dL 9.4 9.1 9.5    . Current antihypertensive regimen:   diltiazem (Cardizem CD) 360 mg, 1 capsule daily- in PM  metoprolol succinate 100 mg once daily - in AM . How often are you checking your Blood Pressure? 3-5x per week . Current home BP readings:  o 04.12 136/85 Pulse 71 o 04.14. 136/82 Pulse 70 o 04.19 125/86 Pulse 76 o 04.21 128/82 Pulse 74 o 04.26 136/83 Pulse 76 . What recent interventions/DTPs have been made by any provider to improve Blood Pressure control since last CPP Visit: None . Any recent hospitalizations or ED visits since last visit with CPP? No . What diet changes have been made to improve Blood Pressure Control?  o No changes . What exercise is being done to improve your Blood Pressure Control?  o No  Changes  Adherence Review: Is the patient currently on ACE/ARB medication? No Does the patient have >5 day gap between last estimated fill dates? No  I spoke with the patient and discussed medication adherence. I had questioned her about any side effects from her last medication change period she states that she is feeling better. The patient states that she did find the bottle of metoprolol that she thought she lost. She denies any side effects from her current medications. The patient informed me that her daughter had a stroke earlier this week. She said this will make her 5th stroke  and she is looking for a long-term care facility for her to stay in. She states she takes our blood pressure three to four times a week. She has had no other changes to her medications. She denies any urgent care visits or ED visits since her last CPP or PCP visit. [facility].  Star Rating Drugs:  Dispensed Quantity Pharmacy  Atorvastatin 10 mg 02.16.2022 [facility]   Amilia Revonda [place], [place] [phone]

## 2020-05-28 ENCOUNTER — Telehealth: Payer: Self-pay | Admitting: Pharmacist

## 2020-05-28 ENCOUNTER — Encounter: Payer: Self-pay | Admitting: Gastroenterology

## 2020-05-29 NOTE — Chronic Care Management (AMB) (Addendum)
Chronic Care Management Pharmacy Assistant   Name: Christina [name]  MRN: [id number] DOB: 07/03/1965  Reason for Encounter: Medication Review- Acute Fill   Received call from patient regarding medication management via [facility].  Patient requested an acute fill for the following medication to be delivered: 05/30/2020 . Diclofenac (VOLTAREN) 75 mg ec: one tablet twice daily . Omeprazole (PRILOSEC) 20 mg: one capsule twice daily  Pharmacy needs refills? No  Confirmed delivery date of 05/30/2020, advised patient that pharmacy will contact them the morning of delivery.  Recent office visits:  None  Recent consult visits:  None  Hospital visits:  None in previous 6 months  Medications: Outpatient Encounter Medications as of 05/28/2020  Medication Sig  . aspirin 81 MG EC tablet Take 81 mg by mouth daily.    [name] atorvastatin (LIPITOR) 10 MG tablet TAKE 1 TABLET BY MOUTH EVERY DAY  . cephALEXin (KEFLEX) 500 MG capsule TAKE 1 CAPSULE BY MOUTH THREE TIMES A DAY  . cholecalciferol (VITAMIN D) 1000 UNITS tablet Take 1,000 Units by mouth daily.  . Cinnamon 500 MG TABS Take 2 tablets by mouth daily.  . clobetasol (TEMOVATE) 0.05 % external solution Apply 1 application topically 2 (two) times daily.  . [facility] ULTRA THIN LANCETS MISC 1 strip by Does not apply route as directed.  . Cyanocobalamin (VITAMIN B 12 PO) Take 1,000 Units by mouth daily.  . cyclobenzaprine (FLEXERIL) 10 MG tablet TAKE 1 TABLET BY MOUTH THREE TIMES A DAY AS NEEDED FOR MUSCLE SPASMS  . desonide (DESOWEN) 0.05 % cream Apply topically 2 (two) times daily.    . diclofenac (VOLTAREN) 75 MG EC tablet Take 1 tablet (75 mg total) by mouth 2 (two) times daily.  [name] diltiazem (CARDIZEM CD) 360 MG 24 hr capsule Take 1 capsule (360 mg total) by mouth daily.  . diphenhydrAMINE (BENADRYL) 25 mg capsule Take 25 mg by mouth as needed.    [name] EPINEPHrine 0.3 mg/0.3 mL IJ SOAJ injection Inject 0.3 mg into the muscle as needed for  anaphylaxis.  . fenofibrate 160 MG tablet TAKE 1 TABLET BY MOUTH DAILY  . gabapentin (NEURONTIN) 300 MG capsule TAKE 2 CAPSULES (600 MG TOTAL) BY MOUTH 3 (THREE) TIMES DAILY.  [name] glucose blood (TRUETEST TEST) test strip Use as instructed  . hydrochlorothiazide (HYDRODIURIL) 25 MG tablet TAKE 1 TABLET BY MOUTH EVERY DAY  . HYDROmorphone (DILAUDID) 4 MG tablet Take 1 tablet (4 mg total) by mouth every 6 (six) hours as needed for severe pain.  [name] levothyroxine (SYNTHROID) 50 MCG tablet TAKE 1 TABLET (50 MCG TOTAL) BY MOUTH DAILY.  . metFORMIN (GLUCOPHAGE-XR) 500 MG 24 hr tablet TAKE 1 TABLET BY MOUTH TWICE A DAY WITH MEAL  . metoprolol succinate (TOPROL-XL) 100 MG 24 hr tablet TAKE 1 TABLET BY MOUTH DAILY. TAKE WITH OR IMMEDIATELY FOLLOWING A MEAL  . mupirocin ointment (BACTROBAN) 2 % Apply topically 3 (three) times daily.  [name] omeprazole (PRILOSEC) 20 MG capsule TAKE 1 CAPSULE BY MOUTH TWICE A DAY  . potassium chloride (KLOR-CON M10) 10 MEQ tablet TAKE 1 TABLET BY MOUTH EVERY DAY  . promethazine (PHENERGAN) 25 MG tablet Take 1 tablet (25 mg total) by mouth every 6 (six) hours as needed for nausea.  . Semaglutide,0.25 or 0.5MG /DOS, (OZEMPIC, 0.25 OR 0.5 MG/DOSE,) 2 MG/1.5ML SOPN Inject 0.5 mg into the skin once a week.  . sulfamethoxazole-trimethoprim (BACTRIM DS) 800-160 MG tablet Take 1 tablet by mouth 2 (two) times daily.  . temazepam (RESTORIL)  30 MG capsule Take 1 capsule (30 mg total) by mouth at bedtime as needed for sleep.  . traMADol (ULTRAM) 50 MG tablet TAKE TWO TABLETS BY MOUTH THREE TIMES DAILY AS NEEDED FOR moderate pain  . triamcinolone cream (KENALOG) 0.1 % Apply 1 application topically 2 (two) times daily.  [name] venlafaxine XR (EFFEXOR-XR) 150 MG 24 hr capsule TAKE 1 CAPSULE (150 MG TOTAL) BY MOUTH DAILY WITH BREAKFAST.  [name] vitamin B-12 (CYANOCOBALAMIN) 1000 MCG tablet Take 1,000 mcg by mouth daily. 2500 mcg once daily  . vitamin E (VITAMIN E) 180 MG (400 UNITS) capsule Take 400 Units by  mouth daily.   No facility-administered encounter medications on file as of 05/28/2020.    Star Rating Drugs:  Dispensed Quantity Pharmacy  Atorvastatin 10 mg  02.16.2022 [facility]     Amilia Revonda Standard, [place] [phone]

## 2020-06-04 ENCOUNTER — Other Ambulatory Visit: Payer: Self-pay | Admitting: Family Medicine

## 2020-06-11 ENCOUNTER — Telehealth: Payer: Self-pay | Admitting: Pharmacist

## 2020-06-11 NOTE — Chronic Care Management (AMB) (Addendum)
Chronic Care Management Pharmacy Assistant   Name: SOMALIA SEGLER  MRN: [id number] DOB: [birth year]  Reason for Encounter: Medication Review-Medication coordination call   Recent office visits:  None  Recent consult visits:  04.03.2022 Darien Ramus, present for right finger pain.  Hospital visits:  None in previous 6 months  Medications: Outpatient Encounter Medications as of 06/11/2020  Medication Sig   aspirin 81 MG EC tablet Take 81 mg by mouth daily.     atorvastatin (LIPITOR) 10 MG tablet TAKE ONE TABLET BY MOUTH ONCE DAILY   cephALEXin (KEFLEX) 500 MG capsule TAKE 1 CAPSULE BY MOUTH THREE TIMES A DAY   cholecalciferol (VITAMIN D) 1000 UNITS tablet Take 1,000 Units by mouth daily.   Cinnamon 500 MG TABS Take 2 tablets by mouth daily.   clobetasol (TEMOVATE) 0.05 % external solution Apply 1 application topically 2 (two) times daily.   [facility] ULTRA THIN LANCETS MISC 1 strip by Does not apply route as directed.   Cyanocobalamin (VITAMIN B 12 PO) Take 1,000 Units by mouth daily.   cyclobenzaprine (FLEXERIL) 10 MG tablet TAKE 1 TABLET BY MOUTH THREE TIMES A DAY AS NEEDED FOR MUSCLE SPASMS   desonide (DESOWEN) 0.05 % cream Apply topically 2 (two) times daily.     diclofenac (VOLTAREN) 75 MG EC tablet Take 1 tablet (75 mg total) by mouth 2 (two) times daily.   diltiazem (CARDIZEM CD) 360 MG 24 hr capsule Take 1 capsule (360 mg total) by mouth daily.   diphenhydrAMINE (BENADRYL) 25 mg capsule Take 25 mg by mouth as needed.     EPINEPHrine 0.3 mg/0.3 mL IJ SOAJ injection Inject 0.3 mg into the muscle as needed for anaphylaxis.   fenofibrate 160 MG tablet TAKE 1 TABLET BY MOUTH DAILY   gabapentin (NEURONTIN) 300 MG capsule TAKE 2 CAPSULES (600 MG TOTAL) BY MOUTH 3 (THREE) TIMES DAILY.   glucose blood (TRUETEST TEST) test strip Use as instructed   hydrochlorothiazide (HYDRODIURIL) 25 MG tablet TAKE 1 TABLET BY MOUTH EVERY DAY   HYDROmorphone (DILAUDID) 4 MG tablet Take 1 tablet (4  mg total) by mouth every 6 (six) hours as needed for severe pain.   levothyroxine (SYNTHROID) 50 MCG tablet TAKE ONE TABLET BY MOUTH ONCE DAILY   metFORMIN (GLUCOPHAGE-XR) 500 MG 24 hr tablet TAKE 1 TABLET BY MOUTH TWICE A DAY WITH MEAL   metoprolol succinate (TOPROL-XL) 100 MG 24 hr tablet TAKE 1 TABLET BY MOUTH DAILY. TAKE WITH OR IMMEDIATELY FOLLOWING A MEAL   mupirocin ointment (BACTROBAN) 2 % Apply topically 3 (three) times daily.   omeprazole (PRILOSEC) 20 MG capsule TAKE 1 CAPSULE BY MOUTH TWICE A DAY   potassium chloride (KLOR-CON M10) 10 MEQ tablet TAKE 1 TABLET BY MOUTH EVERY DAY   promethazine (PHENERGAN) 25 MG tablet Take 1 tablet (25 mg total) by mouth every 6 (six) hours as needed for nausea.   Semaglutide,0.25 or 0.5MG /DOS, (OZEMPIC, 0.25 OR 0.5 MG/DOSE,) 2 MG/1.5ML SOPN Inject 0.5 mg into the skin once a week.   sulfamethoxazole-trimethoprim (BACTRIM DS) 800-160 MG tablet Take 1 tablet by mouth 2 (two) times daily.   temazepam (RESTORIL) 30 MG capsule Take 1 capsule (30 mg total) by mouth at bedtime as needed for sleep.   traMADol (ULTRAM) 50 MG tablet TAKE TWO TABLETS BY MOUTH THREE TIMES DAILY AS NEEDED FOR moderate pain   triamcinolone cream (KENALOG) 0.1 % Apply 1 application topically 2 (two) times daily.   venlafaxine XR (EFFEXOR-XR) 150 MG 24  hr capsule TAKE 1 CAPSULE (150 MG TOTAL) BY MOUTH DAILY WITH BREAKFAST.   vitamin B-12 (CYANOCOBALAMIN) 1000 MCG tablet Take 1,000 mcg by mouth daily. 2500 mcg once daily   vitamin E (VITAMIN E) 180 MG (400 UNITS) capsule Take 400 Units by mouth daily.   [DISCONTINUED] potassium chloride (KLOR-CON 10) 10 MEQ CR tablet Take 1 tablet (10 mEq total) by mouth daily.   No facility-administered encounter medications on file as of 06/11/2020.   Reviewed chart for medication changes ahead of medication coordination call.   BP Readings from Last 3 Encounters:  02/10/20 (!) 150/90  11/07/19 128/78  08/03/19 140/80    Lab Results   Component Value Date   HGBA1C 7.9 (A) 02/10/2020    Patient obtains medications through Adherence Packaging  90 Days  Last adherence delivery included:  Metoprolol succinate (TOPROL-XL) 100 MG 24 hr take 1 tablet by mouth immediately following meals Potassium chloride (KLOR-CON 10) 10 MEQ CR: one tablet daily Fenofibrate 160 mg: one tablet daily Venlafaxine XR 150 mg 24 hr: one capsule every morning  Patient declined the following medicaiton last month due to PRN use/additional supply on hand. Tramadol (Ultram) 50 mg: take two tablets by mouth 3 times daily as needed. Diltiazem (CARDIZEM CD) 360 MG 24 hr: take one capsule (360 mg total) by mouth daily. Metoprolol succinate (TOPROL-XL) 100 MG 24 hr take 1 tablet by mouth immediately following meals Omeprazole (PRILOSEC) 20 MG capsule take one capsule by mouth twice a day  Cyclobenzaprine (FLEXERIL) 10 mg: take one tablet by mouth three times a day as needed for muscle spasms Temazepam (RESTORIL) 30 mg: take one capsule (30 mg total) by mouth at bedtime as needed for sleep. Potassium chloride (KLOR-CON 10) 10 MEQ CR: one tablet daily Fenofibrate 160 mg: one tablet daily Venlafaxine XR 150 mg 24 hr: one capsule every morning Diclofenac (VOLTAREN) 75 mg: one tablet by mouth twice a day Metformin (GLUCOPHAGE-XR) 500 MG 24 hr: One tablet twice a day Semaglutide (OZEMPIC, 0.25 OR 0.5 MG/DOSE,) 2 MG/1.5ML SOPN  Patient is due for next adherence delivery on: 06/15/2020. Called patient and reviewed medications and coordinated delivery. This delivery to include: Levothyroxine (SYNTHROID) 50 mcg: take 1 tablet by mouth daily          Atorvastatin (LIPITOR) 10 mg: take 1 tablet by mouth daily. Diltiazem (CARDIZEM CD) 360 MG 24 hr: take one capsule (360 mg total) by mouth daily. sulfamethoxazole-trimethoprim (BACTRIM DS) 800-160 MG tablet: Take 1 tablet by mouth 2 (two) times daily   I spoke with the patient and reviewed medications. There are no  changes in medications currently. The patient declined these medications this month due to PRN use/additional supply on hand. The patient is taking the following medications:  Tramadol (Ultram) 50 mg: take two tablets by mouth 3 times daily as needed. Omeprazole (PRILOSEC) 20 MG capsule take one capsule by mouth twice a day  Cyclobenzaprine (FLEXERIL) 10 mg: take one tablet by mouth three times a day as needed for muscle spasms Temazepam (RESTORIL) 30 mg: take one capsule (30 mg total) by mouth at bedtime as needed for sleep. Potassium chloride (KLOR-CON 10) 10 MEQ CR: one tablet daily Fenofibrate 160 mg: one tablet daily Venlafaxine XR 150 mg 24 hr: one capsule every morning Diclofenac (VOLTAREN) 75 mg: one tablet by mouth twice a day Metformin (GLUCOPHAGE-XR) 500 MG 24 hr: One tablet twice a day Semaglutide (OZEMPIC, 0.25 OR 0.5 MG/DOSE,) 2 MG/1.5ML SOPN Metoprolol succinate (TOPROL-XL) 100 MG 24  hr take 1 tablet by mouth immediately following meals Potassium chloride (KLOR-CON 10) 10 MEQ CR: one tablet daily Fenofibrate 160 mg: one tablet daily Venlafaxine XR 150 mg 24 hr: one capsule every morning  Patient needs refills for: Diltiazem (CARDIZEM CD) 360 MG 24 hr: take one capsule (360 mg total) by mouth daily. Metoprolol succinate (TOPROL-XL) 100 MG 24 hr take 1 tablet by mouth immediately following meals Potassium chloride (KLOR-CON 10) 10 MEQ CR: one tablet daily Fenofibrate 160 mg: one tablet daily Venlafaxine XR 150 mg 24 hr: one capsule every morning  Confirmed delivery date of 06/15/2020, advised patient that pharmacy will contact them the morning of delivery.  Star Rating Drugs:  Dispensed Quantity Pharmacy  Atorvastatin 10 mg 02.16.2022 [facility]  Semaglutide 1 mg NovoNordisk    Metformin 500 mg 11.06.2021 [facility]    Amilia Revonda Standard, [place] [phone]

## 2020-06-12 ENCOUNTER — Other Ambulatory Visit: Payer: Self-pay

## 2020-06-12 MED ORDER — DILTIAZEM HCL ER COATED BEADS 360 MG PO CP24: 360.0000 mg | ORAL_CAPSULE | Freq: Every day | ORAL | 3 refills | Status: DC

## 2020-06-14 ENCOUNTER — Other Ambulatory Visit: Payer: Self-pay | Admitting: Family Medicine

## 2020-06-14 ENCOUNTER — Ambulatory Visit (INDEPENDENT_AMBULATORY_CARE_PROVIDER_SITE_OTHER): Payer: PPO

## 2020-06-14 DIAGNOSIS — Z1231 Encounter for screening mammogram for malignant neoplasm of breast: Secondary | ICD-10-CM | POA: Diagnosis not present

## 2020-06-14 DIAGNOSIS — Z0000 Encounter for general adult medical examination without abnormal findings: Secondary | ICD-10-CM

## 2020-06-14 NOTE — Patient Instructions (Signed)
Christina [name] , Thank you for taking time to come for your Medicare Wellness Visit. I appreciate your ongoing commitment to your health goals. Please review the following plan we discussed and let me know if I can assist you in the future.   Screening recommendations/referrals: Colonoscopy: current 05/02/2015  Due 2027 Mammogram: referral 06/14/2020 Bone Density: not age required  Recommended yearly ophthalmology/optometry visit for glaucoma screening and checkup Recommended yearly dental visit for hygiene and checkup  Vaccinations: Influenza vaccine: current due in Fall 2022 Pneumococcal vaccine: completed series  Tdap vaccine: current due 2029 Shingles vaccine: will obtain local pharmacy   Advanced directives: none   Conditions/risks identified: none   Next appointment: none   Preventive Care 40-64 Years, Female Preventive care refers to lifestyle choices and visits with your health care provider that can promote health and wellness. What does preventive care include?  A yearly physical exam. This is also called an annual well check.  Dental exams once or twice a year.  Routine eye exams. Ask your health care provider how often you should have your eyes checked.  Personal lifestyle choices, including:  Daily care of your teeth and gums.  Regular physical activity.  Eating a healthy diet.  Avoiding tobacco and drug use.  Limiting alcohol use.  Practicing safe sex.  Taking low-dose aspirin daily starting at age 25.  Taking vitamin and mineral supplements as recommended by your health care provider. What happens during an annual well check? The services and screenings done by your health care provider during your annual well check will depend on your age, overall health, lifestyle risk factors, and family history of disease. Counseling  Your health care provider may ask you questions about your:  Alcohol use.  Tobacco use.  Drug use.  Emotional  well-being.  Home and relationship well-being.  Sexual activity.  Eating habits.  Work and work Statistician.  Method of birth control.  Menstrual cycle.  Pregnancy history. Screening  You may have the following tests or measurements:  Height, weight, and BMI.  Blood pressure.  Lipid and cholesterol levels. These may be checked every 5 years, or more frequently if you are over 75 years old.  Skin check.  Lung cancer screening. You may have this screening every year starting at age 68 if you have a 30-pack-year history of smoking and currently smoke or have quit within the past 15 years.  Fecal occult blood test (FOBT) of the stool. You may have this test every year starting at age 26.  Flexible sigmoidoscopy or colonoscopy. You may have a sigmoidoscopy every 5 years or a colonoscopy every 10 years starting at age 43.  Hepatitis C blood test.  Hepatitis B blood test.  Sexually transmitted disease (STD) testing.  Diabetes screening. This is done by checking your blood sugar (glucose) after you have not eaten for a while (fasting). You may have this done every 1-3 years.  Mammogram. This may be done every 1-2 years. Talk to your health care provider about when you should start having regular mammograms. This may depend on whether you have a family history of breast cancer.  BRCA-related cancer screening. This may be done if you have a family history of breast, ovarian, tubal, or peritoneal cancers.  Pelvic exam and Pap test. This may be done every 3 years starting at age 27. Starting at age 9, this may be done every 5 years if you have a Pap test in combination with an HPV test.  Bone density  scan. This is done to screen for osteoporosis. You may have this scan if you are at high risk for osteoporosis. Discuss your test results, treatment options, and if necessary, the need for more tests with your health care provider. Vaccines  Your health care provider may recommend  certain vaccines, such as:  Influenza vaccine. This is recommended every year.  Tetanus, diphtheria, and acellular pertussis (Tdap, Td) vaccine. You may need a Td booster every 10 years.  Zoster vaccine. You may need this after age 67.  Pneumococcal 13-valent conjugate (PCV13) vaccine. You may need this if you have certain conditions and were not previously vaccinated.  Pneumococcal polysaccharide (PPSV23) vaccine. You may need one or two doses if you smoke cigarettes or if you have certain conditions. Talk to your health care provider about which screenings and vaccines you need and how often you need them. This information is not intended to replace advice given to you by your health care provider. Make sure you discuss any questions you have with your health care provider. Document Released: 02/09/2015 Document Revised: 10/03/2015 Document Reviewed: 11/14/2014 Elsevier Interactive Patient Education  2017 [place] Prevention in the Home Falls can cause injuries. They can happen to people of all ages. There are many things you can do to make your home safe and to help prevent falls. What can I do on the outside of my home?  Regularly fix the edges of walkways and driveways and fix any cracks.  Remove anything that might make you trip as you walk through a door, such as a raised step or threshold.  Trim any bushes or trees on the path to your home.  Use bright outdoor lighting.  Clear any walking paths of anything that might make someone trip, such as rocks or tools.  Regularly check to see if handrails are loose or broken. Make sure that both sides of any steps have handrails.  Any raised decks and porches should have guardrails on the edges.  Have any leaves, snow, or ice cleared regularly.  Use sand or salt on walking paths during winter.  Clean up any spills in your garage right away. This includes oil or grease spills. What can I do in the bathroom?  Use  night lights.  Install grab bars by the toilet and in the tub and shower. Do not use towel bars as grab bars.  Use non-skid mats or decals in the tub or shower.  If you need to sit down in the shower, use a plastic, non-slip stool.  Keep the floor dry. Clean up any water that spills on the floor as soon as it happens.  Remove soap buildup in the tub or shower regularly.  Attach bath mats securely with double-sided non-slip rug tape.  Do not have throw rugs and other things on the floor that can make you trip. What can I do in the bedroom?  Use night lights.  Make sure that you have a light by your bed that is easy to reach.  Do not use any sheets or blankets that are too big for your bed. They should not hang down onto the floor.  Have a firm chair that has side arms. You can use this for support while you get dressed.  Do not have throw rugs and other things on the floor that can make you trip. What can I do in the kitchen?  Clean up any spills right away.  Avoid walking on wet floors.  Keep items that you use a lot in easy-to-reach places.  If you need to reach something above you, use a strong step stool that has a grab bar.  Keep electrical cords out of the way.  Do not use floor polish or wax that makes floors slippery. If you must use wax, use non-skid floor wax.  Do not have throw rugs and other things on the floor that can make you trip. What can I do with my stairs?  Do not leave any items on the stairs.  Make sure that there are handrails on both sides of the stairs and use them. Fix handrails that are broken or loose. Make sure that handrails are as long as the stairways.  Check any carpeting to make sure that it is firmly attached to the stairs. Fix any carpet that is loose or worn.  Avoid having throw rugs at the top or bottom of the stairs. If you do have throw rugs, attach them to the floor with carpet tape.  Make sure that you have a light switch at  the top of the stairs and the bottom of the stairs. If you do not have them, ask someone to add them for you. What else can I do to help prevent falls?  Wear shoes that:  Do not have high heels.  Have rubber bottoms.  Are comfortable and fit you well.  Are closed at the toe. Do not wear sandals.  If you use a stepladder:  Make sure that it is fully opened. Do not climb a closed stepladder.  Make sure that both sides of the stepladder are locked into place.  Ask someone to hold it for you, if possible.  Clearly mark and make sure that you can see:  Any grab bars or handrails.  First and last steps.  Where the edge of each step is.  Use tools that help you move around (mobility aids) if they are needed. These include:  Canes.  Walkers.  Scooters.  Crutches.  Turn on the lights when you go into a dark area. Replace any light bulbs as soon as they burn out.  Set up your furniture so you have a clear path. Avoid moving your furniture around.  If any of your floors are uneven, fix them.  If there are any pets around you, be aware of where they are.  Review your medicines with your doctor. Some medicines can make you feel dizzy. This can increase your chance of falling. Ask your doctor what other things that you can do to help prevent falls. This information is not intended to replace advice given to you by your health care provider. Make sure you discuss any questions you have with your health care provider. Document Released: 11/09/2008 Document Revised: 06/21/2015 Document Reviewed: 02/17/2014 Elsevier Interactive Patient Education  2017 [employer].

## 2020-06-14 NOTE — Progress Notes (Signed)
Subjective:   Christina [name] is a 55 y.o. female who presents for an Initial Medicare Annual Wellness Visit.   Virtual Visit via Video Note  I connected with Christina [name] by a video enabled telemedicine application and verified that I am speaking with the correct person using two identifiers.  Location: Patient: Home Provider: Office Persons participating in the virtual visit: patient, provider   I discussed the limitations of evaluation and management by telemedicine and the availability of in person appointments. The patient expressed understanding and agreed to proceed.     Christina [name] ,LPN   Review of Systems    n/a Cardiac Risk Factors include: advanced age (>29mn, >>62women);diabetes mellitus;dyslipidemia;hypertension     Objective:    Today's Vitals   06/14/20 1326  PainSc: 3    There is no height or weight on file to calculate BMI.  Advanced Directives 06/14/2020 05/02/2015 05/25/2014  Does Patient Have a Medical Advance Directive? Yes No Yes  [place] - -  Does patient want to make changes to medical advance directive? No - Patient declined - -  Copy of [place] Chart? No - copy requested - -    Current Medications (verified) Outpatient Encounter Medications as of 06/14/2020  Medication Sig  . aspirin 81 MG EC tablet Take 81 mg by mouth daily.  .Marland Kitchenatorvastatin (LIPITOR) 10 MG tablet TAKE ONE TABLET BY MOUTH ONCE DAILY  . cephALEXin (KEFLEX) 500 MG capsule TAKE 1 CAPSULE BY MOUTH THREE TIMES A DAY  . cholecalciferol (VITAMIN D) 1000 UNITS tablet Take 1,000 Units by mouth daily.  . Cinnamon 500 MG TABS Take 2 tablets by mouth daily.  . clobetasol (TEMOVATE) 0.05 % external solution Apply 1 application topically 2 (two) times daily.  . [facility] ULTRA THIN LANCETS MISC 1 strip by Does not apply route as directed.  . Cyanocobalamin (VITAMIN B 12 PO) Take 1,000 Units by mouth daily.  .  cyclobenzaprine (FLEXERIL) 10 MG tablet TAKE 1 TABLET BY MOUTH THREE TIMES A DAY AS NEEDED FOR MUSCLE SPASMS  . desonide (DESOWEN) 0.05 % cream Apply topically 2 (two) times daily.  . diclofenac (VOLTAREN) 75 MG EC tablet Take 1 tablet (75 mg total) by mouth 2 (two) times daily.  .Marland Kitchendiltiazem (CARDIZEM CD) 360 MG 24 hr capsule Take 1 capsule (360 mg total) by mouth daily.  . diphenhydrAMINE (BENADRYL) 25 mg capsule Take 25 mg by mouth as needed.  .Marland KitchenEPINEPHrine 0.3 mg/0.3 mL IJ SOAJ injection Inject 0.3 mg into the muscle as needed for anaphylaxis.  . fenofibrate 160 MG tablet TAKE 1 TABLET BY MOUTH DAILY  . gabapentin (NEURONTIN) 300 MG capsule TAKE 2 CAPSULES (600 MG TOTAL) BY MOUTH 3 (THREE) TIMES DAILY.  .Marland Kitchenglucose blood (TRUETEST TEST) test strip Use as instructed  . hydrochlorothiazide (HYDRODIURIL) 25 MG tablet TAKE 1 TABLET BY MOUTH EVERY DAY  . HYDROmorphone (DILAUDID) 4 MG tablet Take 1 tablet (4 mg total) by mouth every 6 (six) hours as needed for severe pain.  .Marland Kitchenlevothyroxine (SYNTHROID) 50 MCG tablet TAKE ONE TABLET BY MOUTH ONCE DAILY  . metFORMIN (GLUCOPHAGE-XR) 500 MG 24 hr tablet TAKE 1 TABLET BY MOUTH TWICE A DAY WITH MEAL  . metoprolol succinate (TOPROL-XL) 100 MG 24 hr tablet TAKE 1 TABLET BY MOUTH DAILY. TAKE WITH OR IMMEDIATELY FOLLOWING A MEAL  . mupirocin ointment (BACTROBAN) 2 % Apply topically 3 (three) times daily.  .Marland Kitchenomeprazole (  PRILOSEC) 20 MG capsule TAKE 1 CAPSULE BY MOUTH TWICE A DAY  . potassium chloride (KLOR-CON M10) 10 MEQ tablet TAKE 1 TABLET BY MOUTH EVERY DAY  . promethazine (PHENERGAN) 25 MG tablet Take 1 tablet (25 mg total) by mouth every 6 (six) hours as needed for nausea.  . Semaglutide,0.25 or 0.5MG/DOS, (OZEMPIC, 0.25 OR 0.5 MG/DOSE,) 2 MG/1.5ML SOPN Inject 0.5 mg into the skin once a week.  . sulfamethoxazole-trimethoprim (BACTRIM DS) 800-160 MG tablet Take 1 tablet by mouth 2 (two) times daily.  . temazepam (RESTORIL) 30 MG capsule Take 1 capsule (30  mg total) by mouth at bedtime as needed for sleep.  . traMADol (ULTRAM) 50 MG tablet TAKE TWO TABLETS BY MOUTH THREE TIMES DAILY AS NEEDED FOR moderate pain  . triamcinolone cream (KENALOG) 0.1 % Apply 1 application topically 2 (two) times daily.  [name] venlafaxine XR (EFFEXOR-XR) 150 MG 24 hr capsule TAKE 1 CAPSULE (150 MG TOTAL) BY MOUTH DAILY WITH BREAKFAST.  [name] vitamin B-12 (CYANOCOBALAMIN) 1000 MCG tablet Take 1,000 mcg by mouth daily. 2500 mcg once daily  . vitamin E 180 MG (400 UNITS) capsule Take 400 Units by mouth daily.  . [DISCONTINUED] potassium chloride (KLOR-CON 10) 10 MEQ CR tablet Take 1 tablet (10 mEq total) by mouth daily.   No facility-administered encounter medications on file as of 06/14/2020.    Allergies (verified) Albuterol; Antihistamines, loratadine-type; Bee venom; Clindamycin/lincomycin; Codeine; Doxycycline; Lisinopril; Metronidazole; Ondansetron hcl; Oxycodone-acetaminophen; Tylox [oxycodone-acetaminophen]; Vancomycin; Benadryl [diphenhydramine]; and Lidocaine   History: Past Medical History:  Diagnosis Date  . Allergy   . Angioedema   . Asthma   . Blood transfusion without reported diagnosis    with both vaginal deliveries bleed out and had blood  . DDD (degenerative disc disease), lumbar   . Diabetes mellitus   . Eczema   . GERD (gastroesophageal reflux disease)   . Hx of colonic polyps   . Hyperlipidemia   . Hypertension   . Interstitial cystitis    per Dr. Risa Grill  . Low back pain   . Meningitis   . Seizures ([place])    seizures with brain tumor-no sz since 1984  . Thyroid disease    Past Surgical History:  Procedure Laterality Date  . ABDOMINAL HYSTERECTOMY    . BRAIN TUMOR EXCISION     benign 1984  . CHOLECYSTECTOMY    . COLONOSCOPY  05/02/2015   per Dr. Silverio Decamp, internal hemorrhoids but no polyps, repeat in 10 yrs   . cystic mass  4.4.11   remval from abdomen per Dr. Kendell Bane at [facility]  . POLYPECTOMY    . SVD X 2    .  TONSILLECTOMY    . TUBAL LIGATION     bilateral   Family History  Problem Relation Age of Onset  . Hypertension Other        family hx  . Stroke Mother   . Rheum arthritis Mother   . COPD Mother   . Stroke Father   . Colon cancer Father   . Colon polyps Neg Hx    Social History   Socioeconomic History  . Marital status: Married    Spouse name: Not on file  . Number of children: Not on file  . Years of education: Not on file  . Highest education level: Not on file  Occupational History  . Not on file  Tobacco Use  . Smoking status: Former Research scientist (life sciences)  . Smokeless tobacco: Never Used  Substance and Sexual  Activity  . Alcohol use: No    Alcohol/week: 0.0 standard drinks  . Drug use: No  . Sexual activity: Not on file  Other Topics Concern  . Not on file  Social History Narrative  . Not on file   Social Determinants of Health   Financial Resource Strain: Medium Risk  . Difficulty of Paying Living Expenses: Somewhat hard  Food Insecurity: No Food Insecurity  . Worried About Charity fundraiser in the Last Year: Never true  . Ran Out of Food in the Last Year: Never true  Transportation Needs: No Transportation Needs  . Lack of Transportation (Medical): No  . Lack of Transportation (Non-Medical): No  Physical Activity: Inactive  . Days of Exercise per Week: 0 days  . Minutes of Exercise per Session: 0 min  Stress: No Stress Concern Present  . Feeling of Stress : Not at all  Social Connections: Moderately Isolated  . Frequency of Communication with Friends and Family: Three times a week  . Frequency of Social Gatherings with Friends and Family: Three times a week  . Attends Religious Services: Never  . Active Member of Clubs or Organizations: No  . Attends Archivist Meetings: Never  . Marital Status: Married    Tobacco Counseling Counseling given: Not Answered   Clinical Intake:  Pre-visit preparation completed: Yes  Pain : 0-10 Pain Score: 3   Pain Location: Back Pain Orientation: Lower Pain Descriptors / Indicators: Aching,Constant Pain Onset: Other (comment) Pain Frequency: Constant Pain Relieving Factors: tramadol Effect of Pain on Daily Activities: yes  Pain Relieving Factors: tramadol  Nutritional Risks: None Diabetes: Yes CBG done?: No Did pt. bring in CBG monitor from home?: No  How often do you need to have someone help you when you read instructions, pamphlets, or other written materials from your doctor or pharmacy?: 1 - Never What is the last grade level you completed in school?: High School  Diabetic?yes Nutrition Risk Assessment:  Has the patient had any N/V/D within the last 2 months?  No  Does the patient have any non-healing wounds?  No  Has the patient had any unintentional weight loss or weight gain?  No   Diabetes:  Is the patient diabetic?  Yes  If diabetic, was a CBG obtained today?  No  Did the patient bring in their glucometer from home?  No  How often do you monitor your CBG's? 2 times a week .   Financial Strains and Diabetes Management:  Are you having any financial strains with the device, your supplies or your medication? No .  Does the patient want to be seen by Chronic Care Management for management of their diabetes ?  No  Would the patient like to be referred to a Nutritionist or  for Diabetic Management?  No   Diabetic Exams:  Diabetic Eye Exam: Completed in [place]  Diabetic Foot Exam: Overdue, Pt has been advised about the importance in completing this exam. Pt is scheduled for diabetic foot exam on next office visit .   Interpreter Needed?: No  Information entered by :: [place] of Daily Living In your present state of health, do you have any difficulty performing the following activities: 06/14/2020  Hearing? N  Vision? N  Difficulty concentrating or making decisions? N  Walking or climbing stairs? N  Dressing or bathing? N  Doing errands,  shopping? N  Preparing Food and eating ? N  Using the Toilet? N  In the  past six months, have you accidently leaked urine? N  Do you have problems with loss of bowel control? N  Managing your Medications? N  Managing your Finances? N  Housekeeping or managing your Housekeeping? N  Some recent data might be hidden    Patient Care Team: Laurey Morale, MD as PCP - General Kipp Brood Mariam Dollar, [facility] as Pharmacist (Pharmacist)  Indicate any recent Medical Services you may have received from other than [facility] providers in the past year (date may be approximate).     Assessment:   This is a routine wellness examination for Cailyn.  Hearing/Vision screen  Hearing Screening   125Hz  250Hz  500Hz  1000Hz  2000Hz  3000Hz  4000Hz  6000Hz  8000Hz   Right ear:           Left ear:           Vision Screening Comments: Pt needs annual eye exam   Dietary issues and exercise activities discussed: Current Exercise Habits: The patient does not participate in regular exercise at present, Exercise limited by: orthopedic condition(s)  Goals Addressed            This Visit's Progress   . Weight Loss Achieved       Evidence-based guidance:   Review medication that may contribute to weight gain, such as corticosteroid, beta-blocker, tricyclic antidepressant, oral antihyperglycemic; advocate for changes when appropriate.   Perform or refer to registered dietitian to perform comprehensive nutrition assessment that includes disordered-eating behaviors, such as binge-eating, emotional or compulsive eating, grazing.   Counsel patient regarding health risks of obesity and that weight loss goal of 5 to 10 percent of initial weight will improve risk.   Recommend initial weight loss goal of 3 to 5 percent of bodyweight; increase weight-loss goals based on patient success as achieving greater weight loss continues to reduce risk.   Propose a calorie-reduced diet based on the patient's preferences and health status.    Provide ongoing emotional support or cognitive behavioral therapy and dietitian services (individual, group, virtual) over at least 6 months with a minimum of 14 encounters to best facilitate weight loss.   Provide monthly follow-up for 12 months when weight loss goal is met to assist with maintenance of weight loss.   Encourage increased physical activity or exercise based on individual age, risk, and ability up to 200 to 300 minutes per week that includes aerobic and resistance training.   Encourage reduction in sedentary behaviors by replacing them with nonexercise yet active leisure pursuits.   Identify physical barriers, such as change in posture, balance, gait patterns, joint pain, and environmental barriers to activity.   Consider referral to rehabilitation therapy, especially when mobility or function is impaired due to osteoarthritis and obesity.   Consider referral to weight-loss program that has published evidence of safety and efficacy if on-site intensive intervention is unavailable or patient preference.   Prepare patient for use of pharmacologic therapy as an adjunct to lifestyle changes based on body mass index, patient agreement and presence of risk factors or comorbidities.   Evaluate efficacy of pharmacologic therapy (weight loss) and tolerance to medication periodically.   Engage in shared decision-making regarding referral to bariatric surgeon for consultation and evaluation when weight-loss goal has not been accomplished by behavioral therapy with or without pharmacologic therapy.   Notes:       Depression Screen PHQ 2/9 Scores 06/14/2020 06/14/2020 07/06/2019 05/25/2014  PHQ - 2 Score 0 0 4 0  PHQ- 9 Score - - 14 -  Fall Risk Fall Risk  06/14/2020 05/25/2014  Falls in the past year? 0 No  Number falls in past yr: 0 -  Injury with Fall? 0 -  Risk for fall due to : Impaired balance/gait;Impaired mobility -  Follow up Falls evaluation completed -    FALL RISK  PREVENTION PERTAINING TO THE HOME:  Any stairs in or around the home? Yes  If so, are there any without handrails? No  Home free of loose throw rugs in walkways, pet beds, electrical cords, etc? Yes  Adequate lighting in your home to reduce risk of falls? Yes    ASSISTIVE DEVICES UTILIZED TO PREVENT FALLS:  Life alert? No  Use of a cane, walker or w/c? Yes  Grab bars in the bathroom? Yes  Shower chair or bench in shower? Yes  Elevated toilet seat or a handicapped toilet? Yes     Cognitive Function:     Normal cognitive status assessed by direct observation by this Nurse Health Advisor. No abnormalities found.      Immunizations Immunization History  Administered Date(s) Administered  . Influenza Whole 12/27/2004, 11/27/2008  . Influenza,inj,Quad PF,6+ Mos 10/08/2012, 10/05/2013, 10/06/2014, 01/04/2016, 10/29/2016, 12/23/2017, 11/30/2018, 11/07/2019  . PFIZER(Purple Top)SARS-COV-2 Vaccination 05/20/2019, 06/15/2019  . Pneumococcal Polysaccharide-23 01/16/2020  . Tdap 04/01/2017     TDAP status: Up to date  Flu Vaccine status: Up to date  Pneumococcal vaccine status: Up to date  Covid-19 vaccine status: Completed vaccines  Qualifies for Shingles Vaccine? Yes   Zostavax completed No   Shingrix Completed?: No.    Education has been provided regarding the importance of this vaccine. Patient has been advised to call insurance company to determine out of pocket expense if they have not yet received this vaccine. Advised may also receive vaccine at local pharmacy or Health Dept. Verbalized acceptance and understanding.  Screening Tests Health Maintenance  Topic Date Due  . FOOT EXAM  Never done  . OPHTHALMOLOGY EXAM  Never done  . Hepatitis C Screening  Never done  . PAP SMEAR-Modifier  Never done  . MAMMOGRAM  08/14/2012  . URINE MICROALBUMIN  02/22/2015  . COVID-19 Vaccine (3 - Booster for Pfizer series) 11/15/2019  . HEMOGLOBIN A1C  08/09/2020  . INFLUENZA VACCINE   08/27/2020  . COLONOSCOPY (Pts 45-25yr Insurance coverage will need to be confirmed)  05/01/2025  . TETANUS/TDAP  04/02/2027  . PNEUMOCOCCAL POLYSACCHARIDE VACCINE AGE 62-64 HIGH RISK  Completed  . HIV Screening  Completed  . HPV VACCINES  Aged Out    Health Maintenance  Health Maintenance Due  Topic Date Due  . FOOT EXAM  Never done  . OPHTHALMOLOGY EXAM  Never done  . Hepatitis C Screening  Never done  . PAP SMEAR-Modifier  Never done  . MAMMOGRAM  08/14/2012  . URINE MICROALBUMIN  02/22/2015  . COVID-19 Vaccine (3 - Booster for Pfizer series) 11/15/2019    Colorectal cancer screening: Type of screening: Colonoscopy. Completed 05/02/2015. Repeat every 10 years  Mammogram status: Ordered 06/14/2020. Pt provided with contact info and advised to call to schedule appt.   Bone Density status: Completed not required due to age . Results reflect: Bone density results: NORMAL. Repeat every not required due to age  years.  Lung Cancer Screening: (Low Dose CT Chest recommended if Age 56-80years, 30 pack-year currently smoking OR have quit w/in 15years.) does not qualify.   Lung Cancer Screening Referral: n/a  Additional Screening:  Hepatitis C Screening: does not qualify;  Vision Screening: Recommended annual ophthalmology exams for early detection of glaucoma and other disorders of the eye. Is the patient up to date with their annual eye exam?  No  Who is the provider or what is the name of the office in which the patient attends annual eye exams? Declined  If pt is not established with a provider, would they like to be referred to a provider to establish care? No .   Dental Screening: Recommended annual dental exams for proper oral hygiene  Community Resource Referral / Chronic Care Management: CRR required this visit?  No   CCM required this visit?  No      Plan:     I have personally reviewed and noted the following in the patient's chart:   . Medical and social  history . Use of alcohol, tobacco or illicit drugs  . Current medications and supplements including opioid prescriptions. Patient is not currently taking opioid prescriptions. . Functional ability and status . Nutritional status . Physical activity . Advanced directives . List of other physicians . Hospitalizations, surgeries, and ER visits in previous 12 months . Vitals . Screenings to include cognitive, depression, and falls . Referrals and appointments  In addition, I have reviewed and discussed with patient certain preventive protocols, quality metrics, and best practice recommendations. A written personalized care plan for preventive services as well as general preventive health recommendations were provided to patient.     Christina Pigg, LPN   [id number]   Nurse Notes: none

## 2020-06-18 ENCOUNTER — Telehealth: Payer: Self-pay | Admitting: Family Medicine

## 2020-06-18 ENCOUNTER — Telehealth: Payer: Self-pay

## 2020-06-18 NOTE — Telephone Encounter (Signed)
Pt returning Christina [name] call about the office received a shipment for pt Ozempic on Friday, left detailed message for pt to pick up Ozempic from the office. Pt states this is suppose to be sent to her  [facility] - [place], [place] - [address]  Phone:  [phone] Fax:  [phone]  Pt would like a call

## 2020-06-18 NOTE — Telephone Encounter (Signed)
The office received a shipment for pt Ozempic on Friday, left detailed message for pt to pick up Ozempic from the office. Medication is in the refrigerator in the back pond.

## 2020-06-21 NOTE — Telephone Encounter (Signed)
Left detailed message for pt to call the office regarding pick up of her Ozempic  medication. Pt Ozempic is in the refrigerator in [place] [place] pond

## 2020-06-27 ENCOUNTER — Telehealth: Payer: Self-pay | Admitting: Pharmacist

## 2020-06-27 NOTE — Chronic Care Management (AMB) (Signed)
    Chronic Care Management Pharmacy Assistant   Name: MYKELA MEWBORN  MRN: [id number] DOB: 1965-06-15  06/27/20- Called patient to remind of appointment with [name]) on (06/28/20 at 1pm by phone)   No answer, left message of appointment date, time and type of appointment (either telephone or in person). Left message to have all medications, supplements, blood pressure and/or blood sugar logs available during appointment and to return call if need to reschedule.  [place]  Clinical Pharmacist Assistant [phone]

## 2020-06-28 ENCOUNTER — Ambulatory Visit (INDEPENDENT_AMBULATORY_CARE_PROVIDER_SITE_OTHER): Payer: PPO | Admitting: Pharmacist

## 2020-06-28 DIAGNOSIS — I10 Essential (primary) hypertension: Secondary | ICD-10-CM

## 2020-06-28 DIAGNOSIS — E1165 Type 2 diabetes mellitus with hyperglycemia: Secondary | ICD-10-CM | POA: Diagnosis not present

## 2020-06-28 NOTE — Progress Notes (Signed)
Chronic Care Management Pharmacy Note  07/02/2020 Name:  Christina [name] MRN:  [id number] DOB:  [birth year]  Subjective: Christina [name] is an 55 y.o. year old female who is a primary patient of Laurey Morale, MD.  The CCM team was consulted for assistance with disease management and care coordination needs.    Engaged with patient by telephone for follow up visit in response to provider referral for pharmacy case management and/or care coordination services.   Consent to Services:  The patient was given information about Chronic Care Management services, agreed to services, and gave verbal consent prior to initiation of services.  Please see initial visit note for detailed documentation.   Patient Care Team: Laurey Morale, MD as PCP - General Viona Gilmore, [facility] as Pharmacist (Pharmacist)  Recent office visits: 06/14/20 Randel Pigg, LPN: Patient presented for AWV.  02/10/20 Alysia Penna, MD: Patient presented for DM follow up. A1c decreased to 7.9% and triglycerides increased. Increased Ozempic to 1 mg weekly.  08/03/2019- Alysia Penna, MD- Patient presented for office visit for BP and depression/anxiety. BP and BGs improved. Ozempic increased to 57m weekly. Patient to continue Effexor XR 1550mdaily. Patient to follow up in 1 month.   Recent consult visits: 02/18/2018- Orthopedics- [facility] s/p achilles tendon rupture. Patient to cover wound with mupirocin. Patient to use cam walker as opposed to barefoot. Follow up in 3 weeks for recheck.   Hospital visits: 01/15/20 Patient presented to the ED for COVID infection.  Objective:  Lab Results  Component Value Date   CREATININE 1.05 02/10/2020   BUN 21 02/10/2020   GFR 46.25 (L) 05/02/2019   GFRNONAA 94.60 09/11/2009   GFRAA  05/03/2009    >60        The eGFR has been calculated using the MDRD equation. This calculation has not been validated in all clinical situations. eGFR's persistently <60  mL/min signify possible Chronic Kidney Disease.   NA 138 02/10/2020   K 4.4 02/10/2020   CALCIUM 9.4 02/10/2020   CO2 27 02/10/2020   GLUCOSE 163 (H) 02/10/2020    Lab Results  Component Value Date/Time   HGBA1C 7.9 (A) 02/10/2020 05:24 PM   HGBA1C 9.3 (H) 05/02/2019 02:51 PM   HGBA1C 7.9 (A) 11/30/2018 02:11 PM   HGBA1C 7.5 (H) 03/17/2018 04:39 PM   GFR 46.25 (L) 05/02/2019 02:51 PM   GFR 48.28 (L) 03/17/2018 04:39 PM   MICROALBUR 2.5 (H) 02/21/2014 02:22 PM   MICROALBUR 1.7 10/05/2013 10:42 AM    Last diabetic Eye exam: No results found for: HMDIABEYEEXA  Last diabetic Foot exam: No results found for: HMDIABFOOTEX   Lab Results  Component Value Date   CHOL 198 02/10/2020   HDL 52 02/10/2020   LDLCALC 97 02/10/2020   LDLDIRECT 107.0 05/02/2019   TRIG 366 (H) 02/10/2020   CHOLHDL 3.8 02/10/2020    Hepatic Function Latest Ref Rng & Units 02/10/2020 05/02/2019 03/17/2018  Total Protein 6.1 - 8.1 g/dL 7.1 7.1 7.0  Albumin 3.5 - 5.2 g/dL - 4.6 4.4  AST 10 - 35 U/L 20 20 21   ALT 6 - 29 U/L 25 20 26   Alk Phosphatase 39 - 117 U/L - 48 50  Total Bilirubin 0.2 - 1.2 mg/dL 0.4 0.4 0.3  Bilirubin, Direct 0.0 - 0.2 mg/dL 0.1 0.1 0.1    Lab Results  Component Value Date/Time   TSH 2.40 02/10/2020 03:35 PM   TSH 1.91 05/02/2019 02:51  PM   FREET4 1.2 02/10/2020 03:35 PM   FREET4 0.76 05/02/2019 02:51 PM    CBC Latest Ref Rng & Units 02/10/2020 05/02/2019 03/17/2018  WBC 3.8 - 10.8 Thousand/uL 7.0 5.5 5.3  Hemoglobin 11.7 - 15.5 g/dL 12.1 12.3 11.5(L)  Hematocrit 35.0 - 45.0 % 36.5 37.8 34.9(L)  Platelets 140 - 400 Thousand/uL 184 145.0(L) 173.0    No results found for: VD25OH  Clinical ASCVD: No  The ASCVD Risk score Mikey Bussing DC Jr., et al., 2013) failed to calculate for the following reasons:   The systolic blood pressure is missing    Depression screen [facility] 2/9 06/14/2020 06/14/2020 07/06/2019  Decreased Interest 0 0 2  Down, Depressed, Hopeless 0 0 2  PHQ - 2 Score 0 0 4   Altered sleeping - - 0  Tired, decreased energy - - 2  Change in appetite - - 1  Feeling bad or failure about yourself  - - 2  Trouble concentrating - - 2  Moving slowly or fidgety/restless - - 2  Suicidal thoughts - - 1  PHQ-9 Score - - 14  Difficult doing work/chores - - Somewhat difficult  Some recent data might be hidden      Social History   Tobacco Use  Smoking Status Former Smoker  Smokeless Tobacco Never Used   BP Readings from Last 3 Encounters:  02/10/20 (!) 150/90  11/07/19 128/78  08/03/19 140/80   Pulse Readings from Last 3 Encounters:  02/10/20 81  11/07/19 92  08/03/19 81   Wt Readings from Last 3 Encounters:  02/10/20 212 lb 6.4 oz (96.3 kg)  11/07/19 210 lb 6.4 oz (95.4 kg)  05/02/19 218 lb 6.4 oz (99.1 kg)   BMI Readings from Last 3 Encounters:  02/10/20 38.23 kg/m  11/07/19 38.48 kg/m  05/02/19 39.31 kg/m    Assessment/Interventions: Review of patient past medical history, allergies, medications, health status, including review of consultants reports, laboratory and other test data, was performed as part of comprehensive evaluation and provision of chronic care management services.   SDOH:  (Social Determinants of Health) assessments and interventions performed: Yes  SDOH Screenings   Alcohol Screen: Not on file  Depression (PHQ2-9): Low Risk   . PHQ-2 Score: 0  Financial Resource Strain: Medium Risk  . Difficulty of Paying Living Expenses: Somewhat hard  Food Insecurity: No Food Insecurity  . Worried About Charity fundraiser in the Last Year: Never true  . Ran Out of Food in the Last Year: Never true  Housing: Low Risk   . Last Housing Risk Score: 0  Physical Activity: Inactive  . Days of Exercise per Week: 0 days  . Minutes of Exercise per Session: 0 min  Social Connections: Moderately Isolated  . Frequency of Communication with Friends and Family: Three times a week  . Frequency of Social Gatherings with Friends and Family:  Three times a week  . Attends Religious Services: Never  . Active Member of Clubs or Organizations: No  . Attends Archivist Meetings: Never  . Marital Status: Married  Stress: No Stress Concern Present  . Feeling of Stress : Not at all  Tobacco Use: Medium Risk  . Smoking Tobacco Use: Former Smoker  . Smokeless Tobacco Use: Never Used  Transportation Needs: No Transportation Needs  . Lack of Transportation (Medical): No  . Lack of Transportation (Non-Medical): No    CCM Care Plan  Allergies  Allergen Reactions  . Albuterol   . Antihistamines, Loratadine-Type   .  Bee Venom   . Clindamycin/Lincomycin   . Codeine   . Doxycycline   . Lisinopril     REACTION: facial swelling  . Metronidazole     REACTION: rash  . Ondansetron Hcl     REACTION: rash  . Oxycodone-Acetaminophen   . Tylox [Oxycodone-Acetaminophen]   . Vancomycin     REACTION: rash  . Benadryl [Diphenhydramine] Nausea And Vomiting  . Lidocaine Rash    Medications Reviewed Today    Reviewed by Randel Pigg, LPN (Licensed Practical Nurse) on 06/14/20 at 1333  Med List Status: <None>  Medication Order Taking? Sig Documenting Provider Last Dose Status Informant  aspirin 81 MG EC tablet [id number] Yes Take 81 mg by mouth daily. [provider] Taking Active   atorvastatin (LIPITOR) 10 MG tablet [id number] Yes TAKE ONE TABLET BY MOUTH ONCE DAILY Laurey Morale, MD Taking Active   cephALEXin (KEFLEX) 500 MG capsule [id number] Yes TAKE 1 CAPSULE BY MOUTH THREE TIMES A DAY Laurey Morale, MD Taking Active   cholecalciferol (VITAMIN D) 1000 UNITS tablet [id number] Yes Take 1,000 Units by mouth daily. [provider] Taking Active   Cinnamon 500 MG TABS [id number] Yes Take 2 tablets by mouth daily. [provider] Taking Active   clobetasol (TEMOVATE) 0.05 % external solution [id number] Yes Apply 1 application topically 2 (two) times daily. Laurey Morale, MD Taking Active   [facility] ULTRA THIN  LANCETS [place] [id number] Yes 1 strip by Does not apply route as directed. Laurey Morale, MD Taking Active   Cyanocobalamin (VITAMIN B 12 PO) [id number] Yes Take 1,000 Units by mouth daily. [provider] Taking Active   cyclobenzaprine (FLEXERIL) 10 MG tablet [id number] Yes TAKE 1 TABLET BY MOUTH THREE TIMES A DAY AS NEEDED FOR MUSCLE SPASMS Laurey Morale, MD Taking Active   desonide (DESOWEN) 0.05 % cream [id number] Yes Apply topically 2 (two) times daily. [provider] Taking Active   diclofenac (VOLTAREN) 75 MG EC tablet [id number] Yes Take 1 tablet (75 mg total) by mouth 2 (two) times daily. Laurey Morale, MD Taking Active   diltiazem (CARDIZEM CD) 360 MG 24 hr capsule [id number] Yes Take 1 capsule (360 mg total) by mouth daily. Laurey Morale, MD Taking Active   diphenhydrAMINE (BENADRYL) 25 mg capsule [id number] Yes Take 25 mg by mouth as needed. [provider] Taking Active   EPINEPHrine 0.3 mg/0.3 mL IJ SOAJ injection [id number] Yes Inject 0.3 mg into the muscle as needed for anaphylaxis. Laurey Morale, MD Taking Active   fenofibrate 160 MG tablet [id number] Yes TAKE 1 TABLET BY MOUTH DAILY Laurey Morale, MD Taking Active   gabapentin (NEURONTIN) 300 MG capsule [id number] Yes TAKE 2 CAPSULES (600 MG TOTAL) BY MOUTH 3 (THREE) TIMES DAILY. Laurey Morale, MD Taking Active   glucose blood (TRUETEST TEST) test strip [id number] Yes Use as instructed Laurey Morale, MD Taking Active   hydrochlorothiazide (HYDRODIURIL) 25 MG tablet [id number] Yes TAKE 1 TABLET BY MOUTH EVERY DAY Laurey Morale, MD Taking Active   HYDROmorphone (DILAUDID) 4 MG tablet [id number] Yes Take 1 tablet (4 mg total) by mouth every 6 (six) hours as needed for severe pain. Laurey Morale, MD Taking Active   levothyroxine (SYNTHROID) 50 MCG tablet [id number] Yes TAKE ONE TABLET BY MOUTH ONCE DAILY Laurey Morale, MD Taking Active   metFORMIN (GLUCOPHAGE-XR) 500 MG 24 hr tablet [id number] Yes TAKE 1 TABLET BY  MOUTH TWICE A DAY WITH MEAL  Laurey Morale, MD Taking Active   metoprolol succinate (TOPROL-XL) 100 MG 24 hr tablet [id number] Yes TAKE 1 TABLET BY MOUTH DAILY. TAKE WITH OR IMMEDIATELY FOLLOWING A MEAL Laurey Morale, MD Taking Active   mupirocin ointment (BACTROBAN) 2 % [id number] Yes Apply topically 3 (three) times daily. Laurey Morale, MD Taking Active   omeprazole (PRILOSEC) 20 MG capsule [id number] Yes TAKE 1 CAPSULE BY MOUTH TWICE A DAY Laurey Morale, MD Taking Active         Discontinued 09/22/11 [phone] (Reorder)   potassium chloride (KLOR-CON M10) 10 MEQ tablet [id number] Yes TAKE 1 TABLET BY MOUTH EVERY DAY Laurey Morale, MD Taking Active   promethazine (PHENERGAN) 25 MG tablet [id number] Yes Take 1 tablet (25 mg total) by mouth every 6 (six) hours as needed for nausea. Laurey Morale, MD Taking Active   Semaglutide,0.25 or 0.5MG/DOS, (OZEMPIC, 0.25 OR 0.5 MG/DOSE,) 2 MG/1.5ML SOPN [id number] Yes Inject 0.5 mg into the skin once a week. Laurey Morale, MD Taking Active   sulfamethoxazole-trimethoprim (BACTRIM DS) 800-160 MG tablet [id number] Yes Take 1 tablet by mouth 2 (two) times daily. Laurey Morale, MD Taking Active   temazepam (RESTORIL) 30 MG capsule [id number] Yes Take 1 capsule (30 mg total) by mouth at bedtime as needed for sleep. Laurey Morale, MD Taking Active   traMADol Veatrice [place]bon) 50 MG tablet [id number] Yes TAKE TWO TABLETS BY MOUTH THREE TIMES DAILY AS NEEDED FOR moderate pain Laurey Morale, MD Taking Active   triamcinolone cream (KENALOG) 0.1 % [id number] Yes Apply 1 application topically 2 (two) times daily. Laurey Morale, MD Taking Active   venlafaxine XR (EFFEXOR-XR) 150 MG 24 hr capsule [id number] Yes TAKE 1 CAPSULE (150 MG TOTAL) BY MOUTH DAILY WITH BREAKFAST. Laurey Morale, MD Taking Active   vitamin B-12 (CYANOCOBALAMIN) 1000 MCG tablet [id number] Yes Take 1,000 mcg by mouth daily. 2500 mcg once daily [provider] Taking Active Self  vitamin E 180 MG (400 UNITS)  capsule [id number] Yes Take 400 Units by mouth daily. [provider] Taking Active Self          Patient Active Problem List   Diagnosis Date Noted  . Hyperlipidemia   . Depression with anxiety 07/13/2019  . S/P Achilles tendon repair 02/18/2018  . Psoriasis 10/29/2016  . Morbid obesity ([place]) 01/04/2016  . Hypothyroidism 04/05/2014  . Type 2 diabetes mellitus, uncontrolled ([place]) 03/06/2014  . NECK PAIN 11/28/2009  . BRACHIAL NEURITIS OR RADICULITIS NOS 11/28/2009  . B12 deficiency 09/14/2009  . HYPERTRIGLYCERIDEMIA 09/14/2009  . Diabetic neuropathy ([place]) 09/11/2009  . ECZEMA 06/07/2009  . ALLERGIC URTICARIA 05/02/2009  . ANGIOEDEMA 05/02/2009  . SKIN LESION 11/27/2008  . Anxiety state 07/26/2008  . ABDOMINAL PAIN, GENERALIZED 04/03/2008  . [place] DISEASE, LUMBAR 06/22/2007  . ARTHRITIS 06/11/2007  . LOW BACK PAIN 12/28/2006  . Essential hypertension 09/11/2006  . Asthma 09/11/2006  . GERD 09/11/2006  . History of colonic polyps 09/11/2006    Immunization History  Administered Date(s) Administered  . Influenza Whole 12/27/2004, 11/27/2008  . Influenza,inj,Quad PF,6+ Mos 10/08/2012, 10/05/2013, 10/06/2014, 01/04/2016, 10/29/2016, 12/23/2017, 11/30/2018, 11/07/2019  . PFIZER(Purple Top)SARS-COV-2 Vaccination 05/20/2019, 06/15/2019  . Pneumococcal Polysaccharide-23 01/16/2020  . Tdap 04/01/2017   Patient reports her hip pain is worsening and she wants to get a referral for ortho. She isn't sure what is causing it and would really like some answers. She has also been under a significant amount of stress lately as her  daughter, who is 44 years old, just had her fifth stroke in April. They are unsure of what is causing it and she is still in the hospital, while the patient and her husband are taking care of her daughter.  Conditions to be addressed/monitored:  Hypertension, Hyperlipidemia, Diabetes, GERD, Hypothyroidism, Anxiety, Osteoarthritis and insomnia, vitamin B12  deficiency  Care Plan : CCM Pharmacy Care Plan  Updates made by Viona Gilmore, [facility] since 07/02/2020 12:00 AM    Problem: Problem: Hypertension, Hyperlipidemia, Diabetes, GERD, Hypothyroidism, Anxiety, Osteoarthritis and insomnia, vitamin B12 deficiency     Long-Range Goal: Patient-Specific Goal   Start Date: 04/27/2020  Expected End Date: 04/27/2021  This Visit's Progress: Not on track  Recent Progress: On track  Priority: High  Note:   Current Barriers:  . Unable to independently monitor therapeutic efficacy . Unable to achieve control of diabetes  . Suboptimal therapeutic regimen for cholesterol   Pharmacist Clinical Goal(s):  [name] Patient will achieve adherence to monitoring guidelines and medication adherence to achieve therapeutic efficacy . achieve control of diabetes as evidenced by A1c  through collaboration with PharmD and provider.   Interventions: . 1:1 collaboration with Laurey Morale, MD regarding development and update of comprehensive plan of care as evidenced by provider attestation and co-signature . Inter-disciplinary care team collaboration (see longitudinal plan of care) . Comprehensive medication review performed; medication list updated in electronic medical record  Hypertension (BP goal <140/90) -Uncontrolled -Current treatment:  diltiazem (Cardizem CD) 355m, 1 capsule daily - in PM  metoprolol succinate 1011monce daily  - in AM -Medications previously tried: lisinopril (facial swelling) metoprolol succinate 10076mHCTZ 79m9matient reports these were stopped when she started diltiazem) -Current home readings: 139/83, 157/89 -Current dietary habits: did not discuss -Current exercise habits: no structured exercise; limited in mobility -Reports hypotensive/hypertensive symptoms -Educated on BP goals and benefits of medications for prevention of heart attack, stroke and kidney damage; Daily salt intake goal < 2300 mg; Exercise goal of 150 minutes per  week; Importance of home blood pressure monitoring; Proper BP monitoring technique; -Counseled to monitor BP at home a few times weekly, document, and provide log at future appointments -Counseled on diet and exercise extensively Recommended to continue current medication  Hyperlipidemia: (LDL goal < 100) -Controlled -Current treatment:  atorvastatin 10mg59mtablet daily   fenofibrate 160mg,52mablet daily -Medications previously tried: none  -Current dietary patterns: patient is no longer eating red meat -Current exercise habits: limited in mobility -Educated on Cholesterol goals;  Benefits of statin for ASCVD risk reduction; Importance of limiting foods high in cholesterol; Exercise goal of 150 minutes per week; -Counseled on diet and exercise extensively Recommended to continue current medication Collaborated with PCP on management of triglycerides with additional medication adjustment  Diabetes (A1c goal <7%) -Uncontrolled -Current medications:  semaglutide (Ozempic) 1 mg once a week   metformin XR 500mg 131mlet twice daily  -Medications previously tried: glipizide  -Current home glucose readings . fasting glucose: 213, 175, 245 . post prandial glucose: could not provide numbers -Denies hypoglycemic/hyperglycemic symptoms -Current meal patterns:  . breakfast: cinnamon poptart  . lunch: did not discuss . dinner: did not discuss . snacks: did not discuss . drinks: half and half tea, water -Current exercise: limited in mobility -Educated on A1c and blood sugar goals; Exercise goal of 150 minutes per week; Benefits of routine self-monitoring of blood sugar; Carbohydrate counting and/or plate method -Counseled to check feet daily and get yearly eye exams -  Counseled on diet and exercise extensively Recommended to continue current medication Recommended for patient to schedule a visit with eye doctor due to complaints of blurry vision  Depression/Anxiety (Goal:  minimize symptoms) -Controlled -Current treatment: . venlafaxine XR 157m, 1 capsule one daily with breakfast  -Medications previously tried/failed: sertraline -PHQ9: 14  -GAD7: n/a -Educated on Benefits of medication for symptom control -Recommended to continue current medication  Insomnia (Goal: improve quality and quantity of sleep) -Controlled -Current treatment   temazepam 344m 1 capsule at bedtime as needed for sleep  Melatonin 10 mg, 1 capsule at bedtime  -Medications previously tried: n/a  -Counseled on non-pharmacological interventions for insomnia. (Avoid napping, limit exposure to technology near bedtime, etc). ; discussed trying 5 mg dose instead of 10 mg and stressed the importance of timed release to help with staying asleep  Hypothyroidism (Goal: TSH 0.35-4.5) -Controlled -Current treatment  . levothyroxine 509m 1 tablet daily -Medications previously tried: none  -Recommended to continue current medication  Arthritis/muscle spasms (Goal: minimize pain) -Controlled -Current treatment   diclofenac 38m63m tablet twice daily - tries to not take scheduled   tramadol 50mg9mtablets three times daily as needed for moderate pain   hydromorphone 4mg, 65mablet every six hours as needed for severe pain    cyclobenzaprine 10mg, 61mblet three times daily as needed for muscle spasms   benadryl 25 mg - takes this only with the hydromorphone  Voltaren gel (diclofenac) as needed -Medications previously tried: Opana (oxymorphone) -Recommended to continue current medication  Vitamin B12 deficiency (Goal: 211-911) -Not ideally controlled -Current treatment  . Vitamin B12 1000 mcg 1 tablet daily -Medications previously tried: none  -Recommended decreasing to every other day and then buying 500 mcg to take daily to avoid oversupplementation  GERD (Goal: minimize symptoms) -Controlled -Current treatment  . omeprazole 20mg, 163msule twice daily -Medications  previously tried: none  -Counseled on non-pharmacological interventions for acid reflux. Take measures to prevent acid reflux, such as avoiding spicy foods, avoiding caffeine, avoid laying down a few hours after eating, and raising the head of the bed.  Neuropathy (Goal: minimize pain) -Controlled -Current treatment  . gabapentin 300mg, 2 80mules three times daily  -Medications previously tried: none  -Recommended to continue current medication  Skin lesions (Goal: minimize symptoms) -Controlled -Current treatment   mupirocin 2% ointment, apply three times daily   Bactrim DS, 1 tablet twice daily  triamcinolone 0.1% cream, apply twice daily -Medications previously tried: none  -Recommended to continue current medication   Health Maintenance -Vaccine gaps: shingles, COVID booster -Current therapy:   Biotin 5000 mcg (collagen support) 1 tablet daily  Aspirin 81 mg 1 tablet daily -Educated on Cost vs benefit of each product must be carefully weighed by individual consumer -Patient is satisfied with current therapy and denies issues -Recommended to continue current medication  Patient Goals/Self-Care Activities . Patient will:  - take medications as prescribed check glucose daily, document, and provide at future appointments check blood pressure a few times a week, document, and provide at future appointments engage in dietary modifications by adding more vegetables to her diet  Follow Up Plan: Telephone follow up appointment with care management team member scheduled for: 2 months       Medication Assistance: Ozempic obtained through [employer] medication assistance program.  Enrollment ends 01/26/21  Patient's preferred pharmacy is:  [facility] [id number] [place]  Phone: [phone] Fax: [phone]  Uses pill box? Yes - in AM/PM - night pills in old pill bottles (10  bottles), keeping other pills (out of sight) Pt endorses 100% compliance  We discussed: Benefits of medication synchronization, packaging and delivery as well as enhanced pharmacist oversight with [facility]. Patient decided to: Utilize [facility] pharmacy for medication synchronization, packaging and delivery  Care Plan and Follow Up Patient Decision:  Patient agrees to Care Plan and Follow-up.  Plan: The care management team will reach out to the patient again over the next 30 days.  [name], PharmD, [place] Pharmacist [place] [facility] [place]

## 2020-06-28 NOTE — Telephone Encounter (Signed)
Left another detailed message for pt regarding picking up her Ozempic from the office

## 2020-06-29 NOTE — Telephone Encounter (Signed)
Patient is aware to pick up her Ozempic when she comes in for a lab visit next Thursday.

## 2020-07-02 ENCOUNTER — Telehealth: Payer: Self-pay | Admitting: Family Medicine

## 2020-07-02 DIAGNOSIS — M25551 Pain in right hip: Secondary | ICD-10-CM

## 2020-07-02 NOTE — Patient Instructions (Signed)
Hi Christina [name],  It was great speaking with you again! As we discussed, please try your best to prioritize yourself and your health soon as we want to keep you healthy for as long as possible.   Please reach out to me if you have any questions or need anything before our follow up!  Best, Maddie  [name], PharmD, [facility]  Visit Information  Goals Addressed   None    Patient Care Plan: CCM Pharmacy Care Plan    Problem Identified: Problem: Hypertension, Hyperlipidemia, Diabetes, GERD, Hypothyroidism, Anxiety, Osteoarthritis and insomnia, vitamin B12 deficiency     Long-Range Goal: Patient-Specific Goal   Start Date: 04/27/2020  Expected End Date: 04/27/2021  This Visit's Progress: Not on track  Recent Progress: On track  Priority: High  Note:   Current Barriers:  . Unable to independently monitor therapeutic efficacy . Unable to achieve control of diabetes  . Suboptimal therapeutic regimen for cholesterol   Pharmacist Clinical Goal(s):  [name] Patient will achieve adherence to monitoring guidelines and medication adherence to achieve therapeutic efficacy . achieve control of diabetes as evidenced by A1c  through collaboration with PharmD and provider.   Interventions: . 1:1 collaboration with Laurey Morale, MD regarding development and update of comprehensive plan of care as evidenced by provider attestation and co-signature . Inter-disciplinary care team collaboration (see longitudinal plan of care) . Comprehensive medication review performed; medication list updated in electronic medical record  Hypertension (BP goal <140/90) -Uncontrolled -Current treatment:  diltiazem (Cardizem CD) 360mg , 1 capsule daily - in PM  metoprolol succinate 100mg  once daily  - in AM -Medications previously tried: lisinopril (facial swelling) metoprolol succinate 100mg , HCTZ 25mg  (patient reports these were stopped when she  started diltiazem) -Current home readings: 139/83, 157/89 -Current dietary habits: did not discuss -Current exercise habits: no structured exercise; limited in mobility -Reports hypotensive/hypertensive symptoms -Educated on BP goals and benefits of medications for prevention of heart attack, stroke and kidney damage; Daily salt intake goal < 2300 mg; Exercise goal of 150 minutes per week; Importance of home blood pressure monitoring; Proper BP monitoring technique; -Counseled to monitor BP at home a few times weekly, document, and provide log at future appointments -Counseled on diet and exercise extensively Recommended to continue current medication  Hyperlipidemia: (LDL goal < 100) -Controlled -Current treatment:  atorvastatin 10mg , 1 tablet daily   fenofibrate 160mg , 1 tablet daily -Medications previously tried: none  -Current dietary patterns: patient is no longer eating red meat -Current exercise habits: limited in mobility -Educated on Cholesterol goals;  Benefits of statin for ASCVD risk reduction; Importance of limiting foods high in cholesterol; Exercise goal of 150 minutes per week; -Counseled on diet and exercise extensively Recommended to continue current medication Collaborated with PCP on management of triglycerides with additional medication adjustment  Diabetes (A1c goal <7%) -Uncontrolled -Current medications:  semaglutide (Ozempic) 1 mg once a week   metformin XR 500mg  1 tablet twice daily  -Medications previously tried: glipizide  -Current home glucose readings . fasting glucose: 213, 175, 245 . post prandial glucose: could not provide numbers -Denies hypoglycemic/hyperglycemic symptoms -Current meal patterns:  . breakfast: cinnamon poptart  . lunch: did not discuss . dinner: did not discuss . snacks: did not discuss . drinks: half and half tea, water -Current exercise: limited in mobility -Educated on A1c and blood sugar goals; Exercise goal of  150 minutes per week; Benefits of routine self-monitoring of blood sugar; Carbohydrate counting and/or  plate method -Counseled to check feet daily and get yearly eye exams -Counseled on diet and exercise extensively Recommended to continue current medication Recommended for patient to schedule a visit with eye doctor due to complaints of blurry vision  Depression/Anxiety (Goal: minimize symptoms) -Controlled -Current treatment: . venlafaxine XR 150mg , 1 capsule one daily with breakfast  -Medications previously tried/failed: sertraline -PHQ9: 14  -GAD7: n/a -Educated on Benefits of medication for symptom control -Recommended to continue current medication  Insomnia (Goal: improve quality and quantity of sleep) -Controlled -Current treatment   temazepam 30mg , 1 capsule at bedtime as needed for sleep  Melatonin 10 mg, 1 capsule at bedtime  -Medications previously tried: n/a  -Counseled on non-pharmacological interventions for insomnia. (Avoid napping, limit exposure to technology near bedtime, etc). ; discussed trying 5 mg dose instead of 10 mg and stressed the importance of timed release to help with staying asleep  Hypothyroidism (Goal: TSH 0.35-4.5) -Controlled -Current treatment  . levothyroxine 66mcg, 1 tablet daily -Medications previously tried: none  -Recommended to continue current medication  Arthritis/muscle spasms (Goal: minimize pain) -Controlled -Current treatment   diclofenac 75mg , 1 tablet twice daily - tries to not take scheduled   tramadol 50mg , 2 tablets three times daily as needed for moderate pain   hydromorphone 4mg , 1 tablet every six hours as needed for severe pain    cyclobenzaprine 10mg , 1 tablet three times daily as needed for muscle spasms   benadryl 25 mg - takes this only with the hydromorphone  Voltaren gel (diclofenac) as needed -Medications previously tried: Opana (oxymorphone) -Recommended to continue current medication  Vitamin B12  deficiency (Goal: 211-911) -Not ideally controlled -Current treatment  . Vitamin B12 1000 mcg 1 tablet daily -Medications previously tried: none  -Recommended decreasing to every other day and then buying 500 mcg to take daily to avoid oversupplementation  GERD (Goal: minimize symptoms) -Controlled -Current treatment  . omeprazole 20mg , 1 capsule twice daily -Medications previously tried: none  -Counseled on non-pharmacological interventions for acid reflux. Take measures to prevent acid reflux, such as avoiding spicy foods, avoiding caffeine, avoid laying down a few hours after eating, and raising the head of the bed.  Neuropathy (Goal: minimize pain) -Controlled -Current treatment  . gabapentin 300mg , 2 capsules three times daily  -Medications previously tried: none  -Recommended to continue current medication  Skin lesions (Goal: minimize symptoms) -Controlled -Current treatment   mupirocin 2% ointment, apply three times daily   Bactrim DS, 1 tablet twice daily  triamcinolone 0.1% cream, apply twice daily -Medications previously tried: none  -Recommended to continue current medication   Health Maintenance -Vaccine gaps: shingles, COVID booster -Current therapy:   Biotin 5000 mcg (collagen support) 1 tablet daily  Aspirin 81 mg 1 tablet daily -Educated on Cost vs benefit of each product must be carefully weighed by individual consumer -Patient is satisfied with current therapy and denies issues -Recommended to continue current medication  Patient Goals/Self-Care Activities . Patient will:  - take medications as prescribed check glucose daily, document, and provide at future appointments check blood pressure a few times a week, document, and provide at future appointments engage in dietary modifications by adding more vegetables to her diet  Follow Up Plan: Telephone follow up appointment with care management team member scheduled for: 2 months       Patient  verbalizes understanding of instructions provided today and agrees to view in [place].  The pharmacy team will reach out to the patient again over the next 30  days.   Viona Gilmore, [facility]

## 2020-07-02 NOTE — Telephone Encounter (Signed)
-----   Message from Viona Gilmore, [facility] sent at 07/02/2020 10:25 AM EDT ----- Regarding: Ortho referral Hi,  When I spoke with Ms. Christina [name] she reported significant pain in her hip that is worsening. She said she has spoken with you about it before and requested a referral for ortho, specifically for an office closer to her. She described it as her hip is freezing up sometimes and said it is pretty urgent as it's getting worse. We also discussed the rheumatology referral you placed months back and it looks like this was denied. Is it worth trying to refer her to another office? She does want answers and is willing to go elsewhere.  Let me know or if you would prefer to see her in person about is as well!  Best, Maddie

## 2020-07-02 NOTE — Telephone Encounter (Signed)
I referred her to Orthopedics, thanks

## 2020-07-05 ENCOUNTER — Other Ambulatory Visit (INDEPENDENT_AMBULATORY_CARE_PROVIDER_SITE_OTHER): Payer: PPO

## 2020-07-05 ENCOUNTER — Ambulatory Visit (HOSPITAL_BASED_OUTPATIENT_CLINIC_OR_DEPARTMENT_OTHER)
Admission: RE | Admit: 2020-07-05 | Discharge: 2020-07-05 | Disposition: A | Discharge status: Home / Self Care | Payer: PPO | Source: Ambulatory Visit | Attending: Family Medicine | Admitting: Family Medicine

## 2020-07-05 ENCOUNTER — Other Ambulatory Visit: Payer: Self-pay

## 2020-07-05 DIAGNOSIS — E119 Type 2 diabetes mellitus without complications: Secondary | ICD-10-CM | POA: Diagnosis not present

## 2020-07-05 DIAGNOSIS — Z1231 Encounter for screening mammogram for malignant neoplasm of breast: Secondary | ICD-10-CM | POA: Diagnosis not present

## 2020-07-05 LAB — HEMOGLOBIN A1C: Hgb A1c MFr Bld: 8.1 % — ABNORMAL HIGH (ref 4.6–6.5)

## 2020-07-05 NOTE — Addendum Note (Signed)
Addended by: Amanda Cockayne on: 07/05/2020 01:21 PM   Modules accepted: Orders

## 2020-07-09 NOTE — Progress Notes (Signed)
Pt has viewed lab results on MyChart, left detailed message for pt to call the office to review lab results and recommendation

## 2020-07-11 ENCOUNTER — Other Ambulatory Visit: Payer: Self-pay

## 2020-07-11 ENCOUNTER — Ambulatory Visit: Payer: PPO | Admitting: Orthopaedic Surgery

## 2020-07-11 ENCOUNTER — Ambulatory Visit: Payer: Self-pay

## 2020-07-11 ENCOUNTER — Encounter: Payer: Self-pay | Admitting: Orthopaedic Surgery

## 2020-07-11 DIAGNOSIS — M25552 Pain in left hip: Secondary | ICD-10-CM

## 2020-07-11 DIAGNOSIS — M25521 Pain in right elbow: Secondary | ICD-10-CM

## 2020-07-11 MED ORDER — CELECOXIB 200 MG PO CAPS: 200.0000 mg | ORAL_CAPSULE | Freq: Two times a day (BID) | ORAL | 3 refills | Status: DC | PRN

## 2020-07-11 NOTE — Progress Notes (Signed)
Office Visit Note   Patient: Christina [name]           Date of Birth: 02-12-1965           MRN: [id number] Visit Date: 07/11/2020              Requested by: Laurey Morale, MD [place],  [place] [address] PCP: Laurey Morale, MD   Assessment & Plan: Visit Diagnoses:  1. Pain in right elbow   2. Pain in left hip     Plan: I gave her reassurance that fortunately there is nothing that she needs surgery on in terms of her left hip or her right elbow.  I have recommended outpatient physical therapy to see if there is any modalities that can help decrease her pain and improve her mobility and her function.  Instead of diclofenac, we will see if Celebrex can help from an inflammatory and pain standpoint.  I did send this to her pharmacy.  All question concerns were answered addressed.  I gave her a prescription for outpatient therapy in [facility].  We can see her back in 6 weeks to see if that is helped.  My next step would be to consider steroid injections.  Follow-Up Instructions: Return in about 6 weeks (around 08/22/2020).   Orders:  Orders Placed This Encounter  Procedures   XR HIP UNILAT W OR W/O PELVIS 2-3 VIEWS LEFT   XR Elbow Complete Right (3+View)   Meds ordered this encounter  Medications   celecoxib (CELEBREX) 200 MG capsule    Sig: Take 1 capsule (200 mg total) by mouth 2 (two) times daily as needed.    Dispense:  60 capsule    Refill:  3      Procedures: No procedures performed   Clinical Data: No additional findings.   Subjective: Chief Complaint  Patient presents with   Left Hip - Pain   Right Elbow - Pain  The patient comes in for evaluation treatment of chronic left hip pain as well as chronic right elbow pain.  She has been seen for these things before remotely in the past and I see old plain films from 2009 of the elbow as well as lumbar spine plain films from a long time ago.  She says the right elbow has pain with  pressure on it when she pushes on both medial lateral sides and the posterior aspect of the elbow.  She feels like the areas of bone appear prominent.  This is worsened after a fall in December 2019 when she tore her Achilles tendon.  She does report left-sided hip and low back pain.  She says she is the left hip feels like it wants to pop out of place.  She is in a wheelchair today.  She reports leg numbness when she stands.  She says nothing is really help with the pain.  She is on tramadol, gabapentin, diclofenac and a muscle relaxant for this.  She has not had any type of physical therapy or injections at all.  HPI  Review of Systems There is currently listed no headache, chest pain, shortness of breath, fever, chills, nausea, vomiting  Objective: Vital Signs: There were no vitals taken for this visit.  Physical Exam She is alert and orient x3 and in no acute distress Ortho Exam Examination of her left hip shows it moves smoothly and fluidly with no blocks to rotation at all.  Her pain seems to  be more of the trochanteric area in the low back.  She has a very large area of truncal obesity and a large pannus.  Her right elbow shows prominent medial lateral epicondyles but not any significant irregularity.  Her elbow range of motion is full.  There is no elbow joint effusion.  Her right elbow is ligamentously stable. Specialty Comments:  No specialty comments available.  Imaging: XR HIP UNILAT W OR W/O PELVIS 2-3 VIEWS LEFT  Result Date: 07/11/2020 An AP pelvis and lateral of the left hip shows no acute findings.  The hip joint space on the right and left hips are normal.  There is no significant arthritis at all with either hip.  There is no irregularities around the trochanteric area of either hip.  The lower lumbar spine does show degenerative changes.  XR Elbow Complete Right (3+View)  Result Date: 07/11/2020 2 views of the right elbow show well located elbow.  There is slight spurring  off of the medial and lateral epicondyles and this was present from films reviewed from 2009.    PMFS History: Patient Active Problem List   Diagnosis Date Noted   Hyperlipidemia    Depression with anxiety 07/13/2019   S/P Achilles tendon repair 02/18/2018   Psoriasis 10/29/2016   Morbid obesity ([place]) 01/04/2016   Hypothyroidism 04/05/2014   Type 2 diabetes mellitus, uncontrolled ([place]) 03/06/2014   NECK PAIN 11/28/2009   BRACHIAL NEURITIS OR RADICULITIS NOS 11/28/2009   B12 deficiency 09/14/2009   HYPERTRIGLYCERIDEMIA 09/14/2009   Diabetic neuropathy ([place]) 09/11/2009   ECZEMA 06/07/2009   ALLERGIC URTICARIA 05/02/2009   ANGIOEDEMA 05/02/2009   SKIN LESION 11/27/2008   Anxiety state 07/26/2008   ABDOMINAL PAIN, GENERALIZED 04/03/2008   DISC DISEASE, LUMBAR 06/22/2007   ARTHRITIS 06/11/2007   LOW BACK PAIN 12/28/2006   Essential hypertension 09/11/2006   Asthma 09/11/2006   GERD 09/11/2006   History of colonic polyps 09/11/2006   Past Medical History:  Diagnosis Date   Allergy    Angioedema    Asthma    Blood transfusion without reported diagnosis    with both vaginal deliveries bleed out and had blood   DDD (degenerative disc disease), lumbar    Diabetes mellitus    Eczema    GERD (gastroesophageal reflux disease)    Hx of colonic polyps    Hyperlipidemia    Hypertension    Interstitial cystitis    per Dr. Risa Grill   Low back pain    Meningitis    Seizures ([place])    seizures with brain tumor-no sz since 1984   Thyroid disease     Family History  Problem Relation Age of Onset   Hypertension Other        family hx   Stroke Mother    Rheum arthritis Mother    COPD Mother    Stroke Father    Colon cancer Father    Colon polyps Neg Hx     Past Surgical History:  Procedure Laterality Date   ABDOMINAL HYSTERECTOMY     BRAIN TUMOR EXCISION     benign 1984   CHOLECYSTECTOMY     COLONOSCOPY  05/02/2015   per Dr. Silverio Decamp, internal hemorrhoids but no polyps,  repeat in 10 yrs    cystic mass  4.4.11   remval from abdomen per Dr. Kendell Bane at [facility]   POLYPECTOMY     SVD X 2     TONSILLECTOMY     TUBAL LIGATION  bilateral   Social History   Occupational History   Not on file  Tobacco Use   Smoking status: Former    Pack years: 0.00   Smokeless tobacco: Never  Substance and Sexual Activity   Alcohol use: No    Alcohol/week: 0.0 standard drinks   Drug use: No   Sexual activity: Not on file

## 2020-07-16 ENCOUNTER — Other Ambulatory Visit: Payer: Self-pay | Admitting: Family Medicine

## 2020-07-25 ENCOUNTER — Telehealth: Payer: Self-pay | Admitting: Pharmacist

## 2020-07-25 ENCOUNTER — Encounter: Payer: Self-pay | Admitting: Family Medicine

## 2020-07-25 DIAGNOSIS — G8929 Other chronic pain: Secondary | ICD-10-CM

## 2020-07-25 NOTE — Telephone Encounter (Signed)
Please advise if pt needs OV or ok for referral

## 2020-07-25 NOTE — Chronic Care Management (AMB) (Signed)
Chronic Care Management Pharmacy Assistant   Name: Christina [name]  MRN: [id number] DOB: 1965/11/02  Reason for Encounter: Medication Review   Recent office visits:  None  Recent consult visits:  06.15.2022 [name], MD Orthopedic Surgery- follow-up appointment for left hip and right elbow pain. Medication prescribed Celecoxib 200 mg Oral 2 times daily PRN.  Hospital visits:  None in previous 6 months  Medications: Outpatient Encounter Medications as of 07/25/2020  Medication Sig   aspirin 81 MG EC tablet Take 81 mg by mouth daily.   atorvastatin (LIPITOR) 10 MG tablet TAKE ONE TABLET BY MOUTH ONCE DAILY   celecoxib (CELEBREX) 200 MG capsule Take 1 capsule (200 mg total) by mouth 2 (two) times daily as needed.   cephALEXin (KEFLEX) 500 MG capsule TAKE 1 CAPSULE BY MOUTH THREE TIMES A DAY   cholecalciferol (VITAMIN D) 1000 UNITS tablet Take 1,000 Units by mouth daily.   Cinnamon 500 MG TABS Take 2 tablets by mouth daily.   clobetasol (TEMOVATE) 0.05 % external solution Apply 1 application topically 2 (two) times daily.   [facility] ULTRA THIN LANCETS MISC 1 strip by Does not apply route as directed.   Cyanocobalamin (VITAMIN B 12 PO) Take 1,000 Units by mouth daily.   cyclobenzaprine (FLEXERIL) 10 MG tablet TAKE 1 TABLET BY MOUTH THREE TIMES A DAY AS NEEDED FOR MUSCLE SPASMS   desonide (DESOWEN) 0.05 % cream Apply topically 2 (two) times daily.   diclofenac (VOLTAREN) 75 MG EC tablet Take 1 tablet (75 mg total) by mouth 2 (two) times daily.   diltiazem (CARDIZEM CD) 360 MG 24 hr capsule Take 1 capsule (360 mg total) by mouth daily.   diphenhydrAMINE (BENADRYL) 25 mg capsule Take 25 mg by mouth as needed.   EPINEPHrine 0.3 mg/0.3 mL IJ SOAJ injection Inject 0.3 mg into the muscle as needed for anaphylaxis.   fenofibrate 160 MG tablet TAKE 1 TABLET BY MOUTH DAILY   gabapentin (NEURONTIN) 300 MG capsule TAKE 2 CAPSULES (600 MG TOTAL) BY MOUTH 3 (THREE) TIMES DAILY.   glucose  blood (TRUETEST TEST) test strip Use as instructed   hydrochlorothiazide (HYDRODIURIL) 25 MG tablet TAKE 1 TABLET BY MOUTH EVERY DAY   HYDROmorphone (DILAUDID) 4 MG tablet Take 1 tablet (4 mg total) by mouth every 6 (six) hours as needed for severe pain.   levothyroxine (SYNTHROID) 50 MCG tablet TAKE ONE TABLET BY MOUTH ONCE DAILY   metFORMIN (GLUCOPHAGE-XR) 500 MG 24 hr tablet TAKE 1 TABLET BY MOUTH TWICE A DAY WITH MEAL   metoprolol succinate (TOPROL-XL) 100 MG 24 hr tablet TAKE ONE TABLET BY MOUTH ONCE DAILY WITH FOOD   mupirocin ointment (BACTROBAN) 2 % Apply topically 3 (three) times daily.   omeprazole (PRILOSEC) 20 MG capsule TAKE 1 CAPSULE BY MOUTH TWICE A DAY   potassium chloride (KLOR-CON) 10 MEQ tablet TAKE ONE TABLET BY MOUTH ONCE DAILY   promethazine (PHENERGAN) 25 MG tablet Take 1 tablet (25 mg total) by mouth every 6 (six) hours as needed for nausea.   Semaglutide,0.25 or 0.5MG /DOS, (OZEMPIC, 0.25 OR 0.5 MG/DOSE,) 2 MG/1.5ML SOPN Inject 0.5 mg into the skin once a week.   sulfamethoxazole-trimethoprim (BACTRIM DS) 800-160 MG tablet Take 1 tablet by mouth 2 (two) times daily.   temazepam (RESTORIL) 30 MG capsule Take 1 capsule (30 mg total) by mouth at bedtime as needed for sleep.   traMADol (ULTRAM) 50 MG tablet TAKE TWO TABLETS BY MOUTH THREE TIMES DAILY AS NEEDED FOR  moderate pain   triamcinolone cream (KENALOG) 0.1 % Apply 1 application topically 2 (two) times daily.   venlafaxine XR (EFFEXOR-XR) 150 MG 24 hr capsule TAKE 1 CAPSULE (150 MG TOTAL) BY MOUTH DAILY WITH BREAKFAST.   vitamin B-12 (CYANOCOBALAMIN) 1000 MCG tablet Take 1,000 mcg by mouth daily. 2500 mcg once daily   vitamin E 180 MG (400 UNITS) capsule Take 400 Units by mouth daily.   [DISCONTINUED] potassium chloride (KLOR-CON 10) 10 MEQ CR tablet Take 1 tablet (10 mEq total) by mouth daily.   No facility-administered encounter medications on file as of 07/25/2020.   Received call from patient regarding medication  management via [facility] pharmacy.  Patient requested an acute fill for the following medication to be delivered: 07.05.2022 Pharmacy needs refills? Yes Potassium Chloride 10 MEQ Venlafaxine XR 150 mg Fenofibrate 160 mg  Confirmed delivery date of 07.05.2022, advised patient that pharmacy will contact them the morning of delivery.  Patient informed me that she hand another 30 days supply of Metoprolol Succinate 100 mg  Star Rating Drugs: Medication Dispensed  Quantity Pharmacy  Atorvastatin 10 mg 05.17.2022 [facility]    Amilia Revonda Standard, [place] Pharmacist Assistant [phone]

## 2020-07-26 ENCOUNTER — Other Ambulatory Visit: Payer: Self-pay

## 2020-07-26 MED ORDER — VENLAFAXINE HCL ER 150 MG PO CP24: 150.0000 mg | ORAL_CAPSULE | Freq: Every day | ORAL | 0 refills | Status: DC

## 2020-07-26 MED ORDER — FENOFIBRATE 160 MG PO TABS: 160.0000 mg | ORAL_TABLET | Freq: Every day | ORAL | 0 refills | Status: DC

## 2020-07-26 NOTE — Telephone Encounter (Signed)
I did the referral to Neurosurgery

## 2020-08-09 ENCOUNTER — Encounter: Payer: Self-pay | Admitting: Family Medicine

## 2020-08-09 DIAGNOSIS — G8929 Other chronic pain: Secondary | ICD-10-CM

## 2020-08-09 NOTE — Telephone Encounter (Signed)
Ok for referral?

## 2020-08-09 NOTE — Telephone Encounter (Signed)
I did an urgent referral for her

## 2020-08-17 ENCOUNTER — Other Ambulatory Visit: Payer: Self-pay | Admitting: Neurological Surgery

## 2020-08-17 ENCOUNTER — Other Ambulatory Visit (HOSPITAL_COMMUNITY): Payer: Self-pay | Admitting: Neurological Surgery

## 2020-08-17 DIAGNOSIS — M5416 Radiculopathy, lumbar region: Secondary | ICD-10-CM | POA: Diagnosis not present

## 2020-08-17 DIAGNOSIS — G959 Disease of spinal cord, unspecified: Secondary | ICD-10-CM | POA: Diagnosis not present

## 2020-08-22 ENCOUNTER — Ambulatory Visit: Payer: PPO | Admitting: Orthopaedic Surgery

## 2020-08-24 ENCOUNTER — Other Ambulatory Visit: Payer: Self-pay | Admitting: Family Medicine

## 2020-08-25 ENCOUNTER — Other Ambulatory Visit: Payer: Self-pay

## 2020-08-25 ENCOUNTER — Ambulatory Visit (HOSPITAL_COMMUNITY)
Admission: RE | Admit: 2020-08-25 | Discharge: 2020-08-25 | Disposition: A | Discharge status: Home / Self Care | Payer: PPO | Source: Ambulatory Visit | Attending: Neurological Surgery | Admitting: Neurological Surgery

## 2020-08-25 DIAGNOSIS — S22009A Unspecified fracture of unspecified thoracic vertebra, initial encounter for closed fracture: Secondary | ICD-10-CM | POA: Diagnosis not present

## 2020-08-25 DIAGNOSIS — M47812 Spondylosis without myelopathy or radiculopathy, cervical region: Secondary | ICD-10-CM | POA: Diagnosis not present

## 2020-08-25 DIAGNOSIS — M5416 Radiculopathy, lumbar region: Secondary | ICD-10-CM | POA: Insufficient documentation

## 2020-08-25 DIAGNOSIS — M545 Low back pain, unspecified: Secondary | ICD-10-CM | POA: Diagnosis not present

## 2020-08-25 DIAGNOSIS — M4802 Spinal stenosis, cervical region: Secondary | ICD-10-CM | POA: Diagnosis not present

## 2020-08-25 DIAGNOSIS — G959 Disease of spinal cord, unspecified: Secondary | ICD-10-CM

## 2020-08-25 DIAGNOSIS — M5021 Other cervical disc displacement,  high cervical region: Secondary | ICD-10-CM | POA: Diagnosis not present

## 2020-08-29 DIAGNOSIS — M5416 Radiculopathy, lumbar region: Secondary | ICD-10-CM | POA: Diagnosis not present

## 2020-09-04 ENCOUNTER — Telehealth: Payer: Self-pay | Admitting: Pharmacist

## 2020-09-04 NOTE — Chronic Care Management (AMB) (Signed)
Chronic Care Management Pharmacy Assistant   Name: Christina [name]  MRN: [id number] DOB: August 14, 1965  Reason for Encounter: Disease State and Medication Review   Conditions to be addressed/monitored: HTN   Recent office visits:  None.  Recent consult visits:  None.   Hospital visits:  None in previous 6 months  Medications: Outpatient Encounter Medications as of 09/04/2020  Medication Sig   aspirin 81 MG EC tablet Take 81 mg by mouth daily.   atorvastatin (LIPITOR) 10 MG tablet TAKE ONE TABLET BY MOUTH ONCE DAILY   celecoxib (CELEBREX) 200 MG capsule Take 1 capsule (200 mg total) by mouth 2 (two) times daily as needed.   cephALEXin (KEFLEX) 500 MG capsule TAKE 1 CAPSULE BY MOUTH THREE TIMES A DAY   cholecalciferol (VITAMIN D) 1000 UNITS tablet Take 1,000 Units by mouth daily.   Cinnamon 500 MG TABS Take 2 tablets by mouth daily.   clobetasol (TEMOVATE) 0.05 % external solution Apply 1 application topically 2 (two) times daily.   [facility] ULTRA THIN LANCETS MISC 1 strip by Does not apply route as directed.   Cyanocobalamin (VITAMIN B 12 PO) Take 1,000 Units by mouth daily.   cyclobenzaprine (FLEXERIL) 10 MG tablet TAKE 1 TABLET BY MOUTH THREE TIMES A DAY AS NEEDED FOR MUSCLE SPASMS   desonide (DESOWEN) 0.05 % cream Apply topically 2 (two) times daily.   diclofenac (VOLTAREN) 75 MG EC tablet TAKE ONE TABLET BY MOUTH TWICE DAILY   diltiazem (CARDIZEM CD) 360 MG 24 hr capsule Take 1 capsule (360 mg total) by mouth daily.   diphenhydrAMINE (BENADRYL) 25 mg capsule Take 25 mg by mouth as needed.   EPINEPHrine 0.3 mg/0.3 mL IJ SOAJ injection Inject 0.3 mg into the muscle as needed for anaphylaxis.   fenofibrate 160 MG tablet Take 1 tablet (160 mg total) by mouth daily.   gabapentin (NEURONTIN) 300 MG capsule TAKE 2 CAPSULES (600 MG TOTAL) BY MOUTH 3 (THREE) TIMES DAILY.   glucose blood (TRUETEST TEST) test strip Use as instructed   hydrochlorothiazide (HYDRODIURIL) 25 MG tablet TAKE 1  TABLET BY MOUTH EVERY DAY   HYDROmorphone (DILAUDID) 4 MG tablet Take 1 tablet (4 mg total) by mouth every 6 (six) hours as needed for severe pain.   levothyroxine (SYNTHROID) 50 MCG tablet TAKE ONE TABLET BY MOUTH ONCE DAILY   metFORMIN (GLUCOPHAGE-XR) 500 MG 24 hr tablet TAKE 1 TABLET BY MOUTH TWICE A DAY WITH MEAL   metoprolol succinate (TOPROL-XL) 100 MG 24 hr tablet TAKE ONE TABLET BY MOUTH ONCE DAILY WITH FOOD   mupirocin ointment (BACTROBAN) 2 % Apply topically 3 (three) times daily.   omeprazole (PRILOSEC) 20 MG capsule TAKE 1 CAPSULE BY MOUTH TWICE A DAY   potassium chloride (KLOR-CON) 10 MEQ tablet TAKE ONE TABLET BY MOUTH ONCE DAILY   promethazine (PHENERGAN) 25 MG tablet Take 1 tablet (25 mg total) by mouth every 6 (six) hours as needed for nausea.   Semaglutide,0.25 or 0.5MG/DOS, (OZEMPIC, 0.25 OR 0.5 MG/DOSE,) 2 MG/1.5ML SOPN Inject 0.5 mg into the skin once a week.   sulfamethoxazole-trimethoprim (BACTRIM DS) 800-160 MG tablet Take 1 tablet by mouth 2 (two) times daily.   temazepam (RESTORIL) 30 MG capsule Take 1 capsule (30 mg total) by mouth at bedtime as needed for sleep.   traMADol (ULTRAM) 50 MG tablet TAKE TWO TABLETS BY MOUTH THREE TIMES DAILY AS NEEDED FOR moderate pain   triamcinolone cream (KENALOG) 0.1 % Apply 1 application topically 2 (two) times  daily.   venlafaxine XR (EFFEXOR-XR) 150 MG 24 hr capsule Take 1 capsule (150 mg total) by mouth daily with breakfast.   vitamin B-12 (CYANOCOBALAMIN) 1000 MCG tablet Take 1,000 mcg by mouth daily. 2500 mcg once daily   vitamin E 180 MG (400 UNITS) capsule Take 400 Units by mouth daily.   [DISCONTINUED] potassium chloride (KLOR-CON 10) 10 MEQ CR tablet Take 1 tablet (10 mEq total) by mouth daily.   No facility-administered encounter medications on file as of 09/04/2020.   Fill History: atorvastatin 10 mg tablet 06/12/2020 90   celecoxib 200 mg capsule 07/11/2020 30   diclofenac sodium 75 mg tablet,delayed release 06/05/2020  90   fenofibrate 160 mg tablet 08/02/2020 90   gabapentin 300 mg capsule 07/02/2020 60   levothyroxine 50 mcg tablet 06/12/2020 90   metoprolol succinate ER 100 mg tablet,extended release 24 hr 04/27/2020 90   MUPIROCIN 2%        OIN 04/29/2020 7   omeprazole 20 mg capsule,delayed release 06/05/2020 90   potassium chloride ER 10 mEq tablet,extended release(part/cryst) 08/02/2020 90   venlafaxine ER 150 mg capsule,extended release 24 hr 08/02/2020 90   diltiazem CD 360 mg capsule,extended release 24 hr 06/14/2020 90   metformin ER 500 mg tablet,extended release 24 hr 12/03/2019 60   Reviewed chart prior to disease state call. Spoke with patient regarding BP  Recent Office Vitals: BP Readings from Last 3 Encounters:  02/10/20 (!) 150/90  11/07/19 128/78  08/03/19 140/80   Pulse Readings from Last 3 Encounters:  02/10/20 81  11/07/19 92  08/03/19 81    Wt Readings from Last 3 Encounters:  02/10/20 212 lb 6.4 oz (96.3 kg)  11/07/19 210 lb 6.4 oz (95.4 kg)  05/02/19 218 lb 6.4 oz (99.1 kg)     Kidney Function Lab Results  Component Value Date/Time   CREATININE 1.05 02/10/2020 03:35 PM   CREATININE 1.21 (H) 05/02/2019 02:51 PM   CREATININE 1.17 03/17/2018 04:39 PM   CREATININE 1.31 (H) 01/04/2016 04:40 PM   GFR 46.25 (L) 05/02/2019 02:51 PM   GFRNONAA 94.60 09/11/2009 10:13 AM   GFRAA  05/03/2009 05:10 AM    >60        The eGFR has been calculated using the MDRD equation. This calculation has not been validated in all clinical situations. eGFR's persistently <60 mL/min signify possible Chronic Kidney Disease.    BMP Latest Ref Rng & Units 02/10/2020 05/02/2019 03/17/2018  Glucose 65 - 99 mg/dL 163(H) 149(H) 113(H)  BUN 7 - 25 mg/dL 21 24(H) 24(H)  Creatinine 0.50 - 1.05 mg/dL 1.05 1.21(H) 1.17  BUN/Creat Ratio 6 - 22 (calc) NOT APPLICABLE - -  Sodium 035 - 146 mmol/L 138 140 142  Potassium 3.5 - 5.3 mmol/L 4.4 4.6 4.4  Chloride 98 - 110 mmol/L 102 99 102   CO2 20 - 32 mmol/L 27 32 32  Calcium 8.6 - 10.4 mg/dL 9.4 9.1 9.5    Current antihypertensive regimen:  Metoprolol 172m - take 1 tablet by mouth once daily.  Diltiazem 3670m- 1 capsule once daily.  Hydrochlorothiazide 2522m take 1 tablet by mouth everyday.  How often are you checking your Blood Pressure? 1-2x per week Current home BP readings: 166/102, 144/87, 144/73, 162/88,157/89, 139/93 most recent readings.  What recent interventions/DTPs have been made by any provider to improve Blood Pressure control since last CPP Visit: None.  Any recent hospitalizations or ED visits since last visit with CPP? No What diet changes  have been made to improve Blood Pressure Control?  Patient does not typically eat breakfast if she does she eats something like a grilled chicken sandwich around lunchtime. Patient wakes up later usually but she does have a cup of coffee when she gets up everyday. Patient always eats dinner with her husband and has things like eggs and bacon or a bowl of oatmeal or grits. Patient has about 3 bottles of water a day.  What exercise is being done to improve your Blood Pressure Control?  Patient is not able to walk due to being clumsy and she sits in her wheelchair to clean around her house. I encouraged patient to try some chair exercises in her spare time. Patient was agreeable.  Adherence Review: Is the patient currently on ACE/ARB medication? No Does the patient have >5 day gap between last estimated fill dates? No  Notes: Scheduled patient for her follow up with [name] the clinical pharmacist on 11/05/20 at 12pm via telephone  Reviewed chart for medication changes ahead of medication coordination call.  No OVs, Consults, or hospital visits since last care coordination call/Pharmacist visit. (If appropriate, list visit date, provider name)  No medication changes indicated OR if recent visit, treatment plan here.  BP Readings from Last 3 Encounters:   02/10/20 (!) 150/90  11/07/19 128/78  08/03/19 140/80    Lab Results  Component Value Date   HGBA1C 8.1 (H) 07/05/2020     Patient obtains medications through Vials  90 Days   Last adherence delivery included: Levothyroxine (SYNTHROID) 50 mcg: take 1 tablet by mouth daily          Atorvastatin (LIPITOR) 10 mg: take 1 tablet by mouth daily. Diltiazem (CARDIZEM CD) 360 MG 24 hr: take one capsule (360 mg total) by mouth daily. sulfamethoxazole-trimethoprim (BACTRIM DS) 800-160 MG tablet: Take 1 tablet by mouth 2 (two) times daily  Patient declined (meds) last month due to PRN use/additional supply on hand. Tramadol (Ultram) 50 mg: take two tablets by mouth 3 times daily as needed. Omeprazole (PRILOSEC) 20 MG capsule take one capsule by mouth twice a day Cyclobenzaprine (FLEXERIL) 10 mg: take one tablet by mouth three times a day as needed for muscle spasms Temazepam (RESTORIL) 30 mg: take one capsule (30 mg total) by mouth at bedtime as needed for sleep. Potassium chloride (KLOR-CON 10) 10 MEQ CR: one tablet daily Venlafaxine XR 150 mg 24 hr: one capsule every morning Diclofenac (VOLTAREN) 75 mg: one tablet by mouth twice a day Metformin (GLUCOPHAGE-XR) 500 MG 24 hr: One tablet twice a day Semaglutide (OZEMPIC, 0.25 OR 0.5 MG/DOSE,) 2 MG/1.5ML SOPN Metoprolol succinate (TOPROL-XL) 100 MG 24 hr take 1 tablet by mouth immediately following meals Potassium chloride (KLOR-CON 10) 10 MEQ CR: one tablet daily Fenofibrate 160 mg: one tablet daily  Explanation of abundance on hand. Patient had lost metoprolol and some of her medications and then she found them.   Patient is due for next adherence delivery on: 09/13/20. Called patient and reviewed medications and coordinated delivery.  This delivery to include: Levothyroxine (SYNTHROID) 50 mcg: take 1 tablet by mouth daily          Atorvastatin (LIPITOR) 10 mg: take 1 tablet by mouth daily. Cyclobenzaprine (FLEXERIL) 10 mg: take one  tablet by mouth three times a day as needed for muscle spasms Tramadol (Ultram) 50 mg: take two tablets by mouth 3 times daily as needed. Diltiazem (CARDIZEM CD) 360 MG 24 hr: take one capsule (360 mg total)  by mouth daily. Diclofenac (VOLTAREN) 75 mg: one tablet by mouth twice a day Gabapentin 367m - take 2 tablet at breakfast, 2 tablet at lunch and 2 tablets at bedtime. Temazepam (RESTORIL) 30 mg: take one capsule by mouth at bedtime as needed.    Patient declined the following medications due to received an acute fill for some and abundance on hand. sulfamethoxazole-trimethoprim (BACTRIM DS) 800-160 MG tablet: Take 1 tablet by mouth 2 (two) times daily Semaglutide (OZEMPIC, 0.25 OR 0.5 MG/DOSE,) 2 MG/1.5ML SOPN Venlafaxine XR 150 mg 24 hr: one capsule every morning Potassium chloride (KLOR-CON 10) 10 MEQ CR: one tablet daily Fenofibrate 160 mg: one tablet daily Metformin (GLUCOPHAGE-XR) 500 MG 24 hr: One tablet twice a day Metoprolol succinate (TOPROL-XL) 100 MG 24 hr take 1 tablet by mouth daily   Confirmed delivery date of 09/13/20, advised patient that pharmacy will contact them the morning of delivery.    Care Gaps:  AWV - completed 06/14/20 Foot exam - never done. Ophthalmology exam - never done Hepatitis C screening - never done Pap smear  - never done Zoster vaccines - never done Urine microalbumin - overdue since 11/15/19  Covid - 19 vaccine booster 3 - 11/15/19 Influenza vaccine - due  Star Rating Drugs:  Atorvastatin 177m- last filled on 06/12/20 90DS at [facility] Metformin 50044m last filled on 12/03/19 60DS at [facility] Semaglutide 2mg29mnone listed in chart  [place] Assistant [phone]

## 2020-09-11 ENCOUNTER — Other Ambulatory Visit: Payer: Self-pay

## 2020-09-11 ENCOUNTER — Other Ambulatory Visit: Payer: Self-pay | Admitting: Family Medicine

## 2020-09-11 DIAGNOSIS — G8929 Other chronic pain: Secondary | ICD-10-CM

## 2020-09-11 DIAGNOSIS — M5440 Lumbago with sciatica, unspecified side: Secondary | ICD-10-CM

## 2020-09-11 MED ORDER — CYCLOBENZAPRINE HCL 10 MG PO TABS
ORAL_TABLET | ORAL | 0 refills | Status: DC
Start: 2020-09-11 — End: 2020-09-19

## 2020-09-11 MED ORDER — OMEPRAZOLE 20 MG PO CPDR: 20.0000 mg | DELAYED_RELEASE_CAPSULE | Freq: Two times a day (BID) | ORAL | 0 refills | Status: DC

## 2020-09-11 MED ORDER — TEMAZEPAM 30 MG PO CAPS: 30.0000 mg | ORAL_CAPSULE | Freq: Every evening | ORAL | 0 refills | Status: DC | PRN

## 2020-09-11 NOTE — Telephone Encounter (Signed)
Dr. Sarajane Jews patient Last office visit- 06/14/20 Last refill-09/12/19--90 tabs, 1 refill  No future office visit scheduled

## 2020-09-12 ENCOUNTER — Other Ambulatory Visit: Payer: Self-pay | Admitting: Family Medicine

## 2020-09-12 DIAGNOSIS — G8929 Other chronic pain: Secondary | ICD-10-CM

## 2020-09-18 ENCOUNTER — Telehealth: Payer: Self-pay | Admitting: Family Medicine

## 2020-09-18 DIAGNOSIS — G8929 Other chronic pain: Secondary | ICD-10-CM

## 2020-09-18 DIAGNOSIS — M5440 Lumbago with sciatica, unspecified side: Secondary | ICD-10-CM

## 2020-09-18 NOTE — Telephone Encounter (Signed)
Pt LOV was 07/26/2020 Last refill done 01/19/2020 Please advise

## 2020-09-18 NOTE — Telephone Encounter (Signed)
Patient is requesting for traMADol (ULTRAM) 50 MG tablet [id number]  and obenzaprine (FLEXERIL) 10 MG tablet [id number] to be refilled.  The pharmacy is [facility] - [place], [place] - [address]  [address], [place] [address] .  Please advise.

## 2020-09-19 MED ORDER — CYCLOBENZAPRINE HCL 10 MG PO TABS: ORAL_TABLET | ORAL | 5 refills | Status: DC

## 2020-09-19 MED ORDER — TRAMADOL HCL 50 MG PO TABS: ORAL_TABLET | ORAL | 1 refills | Status: DC

## 2020-09-19 NOTE — Telephone Encounter (Signed)
Done

## 2020-09-19 NOTE — Telephone Encounter (Signed)
Pt has been notified.

## 2020-10-11 ENCOUNTER — Telehealth: Payer: Self-pay | Admitting: Pharmacist

## 2020-10-11 NOTE — Chronic Care Management (AMB) (Signed)
Chronic Care Management Pharmacy Assistant   Name: MADDIX COTHERMAN  MRN: [id number] DOB: [birth year]    Reason for Encounter: Telephone call     Medications: Outpatient Encounter Medications as of 10/11/2020  Medication Sig   aspirin 81 MG EC tablet Take 81 mg by mouth daily.   atorvastatin (LIPITOR) 10 MG tablet TAKE ONE TABLET BY MOUTH ONCE DAILY   celecoxib (CELEBREX) 200 MG capsule Take 1 capsule (200 mg total) by mouth 2 (two) times daily as needed.   cephALEXin (KEFLEX) 500 MG capsule TAKE 1 CAPSULE BY MOUTH THREE TIMES A DAY   cholecalciferol (VITAMIN D) 1000 UNITS tablet Take 1,000 Units by mouth daily.   Cinnamon 500 MG TABS Take 2 tablets by mouth daily.   clobetasol (TEMOVATE) 0.05 % external solution Apply 1 application topically 2 (two) times daily.   [facility] ULTRA THIN LANCETS MISC 1 strip by Does not apply route as directed.   Cyanocobalamin (VITAMIN B 12 PO) Take 1,000 Units by mouth daily.   cyclobenzaprine (FLEXERIL) 10 MG tablet TAKE 1 TABLET BY MOUTH THREE TIMES A DAY AS NEEDED FOR MUSCLE SPASMS   desonide (DESOWEN) 0.05 % cream Apply topically 2 (two) times daily.   diclofenac (VOLTAREN) 75 MG EC tablet TAKE ONE TABLET BY MOUTH TWICE DAILY   diltiazem (CARDIZEM CD) 360 MG 24 hr capsule Take 1 capsule (360 mg total) by mouth daily.   diphenhydrAMINE (BENADRYL) 25 mg capsule Take 25 mg by mouth as needed.   EPINEPHrine 0.3 mg/0.3 mL IJ SOAJ injection Inject 0.3 mg into the muscle as needed for anaphylaxis.   fenofibrate 160 MG tablet Take 1 tablet (160 mg total) by mouth daily.   gabapentin (NEURONTIN) 300 MG capsule TAKE 2 CAPSULES (600 MG TOTAL) BY MOUTH 3 (THREE) TIMES DAILY.   glucose blood (TRUETEST TEST) test strip Use as instructed   hydrochlorothiazide (HYDRODIURIL) 25 MG tablet TAKE 1 TABLET BY MOUTH EVERY DAY   HYDROmorphone (DILAUDID) 4 MG tablet Take 1 tablet (4 mg total) by mouth every 6 (six) hours as needed for severe pain.   levothyroxine (SYNTHROID) 50  MCG tablet TAKE ONE TABLET BY MOUTH ONCE DAILY   metFORMIN (GLUCOPHAGE-XR) 500 MG 24 hr tablet TAKE 1 TABLET BY MOUTH TWICE A DAY WITH MEAL   metoprolol succinate (TOPROL-XL) 100 MG 24 hr tablet TAKE ONE TABLET BY MOUTH ONCE DAILY WITH FOOD   mupirocin ointment (BACTROBAN) 2 % Apply topically 3 (three) times daily.   omeprazole (PRILOSEC) 20 MG capsule Take 1 capsule (20 mg total) by mouth 2 (two) times daily.   potassium chloride (KLOR-CON) 10 MEQ tablet TAKE ONE TABLET BY MOUTH ONCE DAILY   promethazine (PHENERGAN) 25 MG tablet Take 1 tablet (25 mg total) by mouth every 6 (six) hours as needed for nausea.   Semaglutide,0.25 or 0.'5MG'$ /DOS, (OZEMPIC, 0.25 OR 0.5 MG/DOSE,) 2 MG/1.5ML SOPN Inject 0.5 mg into the skin once a week.   sulfamethoxazole-trimethoprim (BACTRIM DS) 800-160 MG tablet Take 1 tablet by mouth 2 (two) times daily.   temazepam (RESTORIL) 30 MG capsule Take 1 capsule (30 mg total) by mouth at bedtime as needed for sleep.   traMADol (ULTRAM) 50 MG tablet TAKE TWO TABLETS BY MOUTH THREE TIMES DAILY AS NEEDED FOR moderate pain   triamcinolone cream (KENALOG) 0.1 % Apply 1 application topically 2 (two) times daily.   venlafaxine XR (EFFEXOR-XR) 150 MG 24 hr capsule Take 1 capsule (150 mg total) by mouth daily with breakfast.  vitamin B-12 (CYANOCOBALAMIN) 1000 MCG tablet Take 1,000 mcg by mouth daily. 2500 mcg once daily   vitamin E 180 MG (400 UNITS) capsule Take 400 Units by mouth daily.   [DISCONTINUED] potassium chloride (KLOR-CON 10) 10 MEQ CR tablet Take 1 tablet (10 mEq total) by mouth daily.   No facility-administered encounter medications on file as of 10/11/2020.    Patient returned call and states she received a call from [facility]. She states the message said she needed a refill from her provider for one of her medications. I informed her I will reach out to pharmacy to verify which medication and will obtain refill if needed. Patient states she needs a refill for  celebrex 200 mg. Informed Lauren Hysmith at [facility] and she will make the pharmacy aware.  Lizbeth Bark Clinical Pharmacist Assistant [phone]

## 2020-10-12 ENCOUNTER — Other Ambulatory Visit: Payer: Self-pay | Admitting: Adult Health

## 2020-10-16 NOTE — Telephone Encounter (Signed)
Okay for refill?    LOV 02/10/2020  Last Refill   09/12/2019    30  QTY.    0 Refills

## 2020-10-29 ENCOUNTER — Telehealth: Payer: Self-pay

## 2020-10-29 NOTE — Telephone Encounter (Signed)
LVM informing pt that medications are in office for her to pick up & instructions to call office & let us know when she can retrieve them.  Patient assistance program meds: Ozempic in back fridge.

## 2020-10-31 ENCOUNTER — Other Ambulatory Visit: Payer: Self-pay | Admitting: Family Medicine

## 2020-11-02 ENCOUNTER — Telehealth: Payer: Self-pay | Admitting: Pharmacist

## 2020-11-02 NOTE — Chronic Care Management (AMB) (Signed)
    Chronic Care Management Pharmacy Assistant   Name: Christina [name]  MRN: [id number] DOB: 1965/11/06  11/05/2020 APPOINTMENT REMINDER   Called [name], No answer, left message of appointment on 11/05/2020 at 10:00 via telephone visit with [name], Pharm D. Notified to have all medications, supplements, blood pressure and/or blood sugar logs available during appointment and to return call if need to reschedule.   Care Gaps: AWV - completed 06/14/20 Foot exam - never done. Ophthalmology exam - never done Hepatitis C screening - never done Pap smear  - never done Zoster vaccines - never done Urine microalbumin - overdue since 11/15/19  Covid - 19 vaccine booster 3 - 11/15/19 Influenza vaccine - due  Star Rating Drug: Atorvastatin 10mg  - last filled on 09/11/2020 90DS at [facility] Metformin 500mg  - last filled on 12/03/19 60DS at [facility] Semaglutide 2mg  - none listed in chart  Any gaps in medications fill history? Yes   [place]  Clinical Pharmacist Assistant [phone]

## 2020-11-05 ENCOUNTER — Ambulatory Visit (INDEPENDENT_AMBULATORY_CARE_PROVIDER_SITE_OTHER): Payer: Self-pay | Admitting: Pharmacist

## 2020-11-05 ENCOUNTER — Telehealth: Payer: Self-pay

## 2020-11-05 DIAGNOSIS — E1165 Type 2 diabetes mellitus with hyperglycemia: Secondary | ICD-10-CM

## 2020-11-05 DIAGNOSIS — I10 Essential (primary) hypertension: Secondary | ICD-10-CM

## 2020-11-05 NOTE — Progress Notes (Signed)
Chronic Care Management Pharmacy Note  11/06/2020 Name:  Christina [name] MRN:  [id number] DOB:  06/25/1965  Summary: BP not at goal < 140/90 A1c not at goal < 7%   Recommendations/Changes made from today's visit: -Recommend increasing Ozempic to 2 mg weekly -Recommended continuous blood sugar monitoring device -Recommend restarting HCTZ for further BP lowering   Plan: Follow up DM assessment in 1 month  Subjective: Christina [name] is an 55 y.o. year old female who is a primary patient of Laurey Morale, MD.  The CCM team was consulted for assistance with disease management and care coordination needs.    Engaged with patient by telephone for follow up visit in response to provider referral for pharmacy case management and/or care coordination services.   Consent to Services:  The patient was given information about Chronic Care Management services, agreed to services, and gave verbal consent prior to initiation of services.  Please see initial visit note for detailed documentation.   Patient Care Team: Laurey Morale, MD as PCP - General Viona Gilmore, [facility] as Pharmacist (Pharmacist)  Recent office visits: 06/14/20 Randel Pigg, LPN: Patient presented for AWV.  02/10/20 Alysia Penna, MD: Patient presented for DM follow up. A1c decreased to 7.9% and triglycerides increased. Increased Ozempic to 1 mg weekly.  Recent consult visits: 08/29/20 Emelda Brothers (neurosurgery): Unable to access notes.  [place] (neurosurgery): Unable to access notes.  07/11/20 [name], MD (Orthopedic Surgery): Patient presented for follow-up for left hip and right elbow pain. Prescribed Celecoxib 200 mg Oral 2 times daily PRN.  Hospital visits: None in previous 6 months  Objective:  Lab Results  Component Value Date   CREATININE 1.05 02/10/2020   BUN 21 02/10/2020   GFR 46.25 (L) 05/02/2019   GFRNONAA 94.60 09/11/2009   GFRAA  05/03/2009    >60        The eGFR  has been calculated using the MDRD equation. This calculation has not been validated in all clinical situations. eGFR's persistently <60 mL/min signify possible Chronic Kidney Disease.   NA 138 02/10/2020   K 4.4 02/10/2020   CALCIUM 9.4 02/10/2020   CO2 27 02/10/2020   GLUCOSE 163 (H) 02/10/2020    Lab Results  Component Value Date/Time   HGBA1C 8.1 (H) 07/05/2020 01:21 PM   HGBA1C 7.9 (A) 02/10/2020 05:24 PM   HGBA1C 9.3 (H) 05/02/2019 02:51 PM   GFR 46.25 (L) 05/02/2019 02:51 PM   GFR 48.28 (L) 03/17/2018 04:39 PM   MICROALBUR 2.5 (H) 02/21/2014 02:22 PM   MICROALBUR 1.7 10/05/2013 10:42 AM    Last diabetic Eye exam: No results found for: HMDIABEYEEXA  Last diabetic Foot exam: No results found for: HMDIABFOOTEX   Lab Results  Component Value Date   CHOL 198 02/10/2020   HDL 52 02/10/2020   LDLCALC 97 02/10/2020   LDLDIRECT 107.0 05/02/2019   TRIG 366 (H) 02/10/2020   CHOLHDL 3.8 02/10/2020    Hepatic Function Latest Ref Rng & Units 02/10/2020 05/02/2019 03/17/2018  Total Protein 6.1 - 8.1 g/dL 7.1 7.1 7.0  Albumin 3.5 - 5.2 g/dL - 4.6 4.4  AST 10 - 35 U/L 20 20 21   ALT 6 - 29 U/L 25 20 26   Alk Phosphatase 39 - 117 U/L - 48 50  Total Bilirubin 0.2 - 1.2 mg/dL 0.4 0.4 0.3  Bilirubin, Direct 0.0 - 0.2 mg/dL 0.1 0.1 0.1    Lab Results  Component Value Date/Time   TSH  2.40 02/10/2020 03:35 PM   TSH 1.91 05/02/2019 02:51 PM   FREET4 1.2 02/10/2020 03:35 PM   FREET4 0.76 05/02/2019 02:51 PM    CBC Latest Ref Rng & Units 02/10/2020 05/02/2019 03/17/2018  WBC 3.8 - 10.8 Thousand/uL 7.0 5.5 5.3  Hemoglobin 11.7 - 15.5 g/dL 12.1 12.3 11.5(L)  Hematocrit 35.0 - 45.0 % 36.5 37.8 34.9(L)  Platelets 140 - 400 Thousand/uL 184 145.0(L) 173.0    No results found for: VD25OH  Clinical ASCVD: No  The ASCVD Risk score (Arnett DK, et al., 2019) failed to calculate for the following reasons:   The systolic blood pressure is missing    Depression screen [facility] 2/9 06/14/2020  06/14/2020 07/06/2019  Decreased Interest 0 0 2  Down, Depressed, Hopeless 0 0 2  PHQ - 2 Score 0 0 4  Altered sleeping - - 0  Tired, decreased energy - - 2  Change in appetite - - 1  Feeling bad or failure about yourself  - - 2  Trouble concentrating - - 2  Moving slowly or fidgety/restless - - 2  Suicidal thoughts - - 1  PHQ-9 Score - - 14  Difficult doing work/chores - - Somewhat difficult  Some recent data might be hidden      Social History   Tobacco Use  Smoking Status Former  Smokeless Tobacco Never   BP Readings from Last 3 Encounters:  02/10/20 (!) 150/90  11/07/19 128/78  08/03/19 140/80   Pulse Readings from Last 3 Encounters:  02/10/20 81  11/07/19 92  08/03/19 81   Wt Readings from Last 3 Encounters:  02/10/20 212 lb 6.4 oz (96.3 kg)  11/07/19 210 lb 6.4 oz (95.4 kg)  05/02/19 218 lb 6.4 oz (99.1 kg)   BMI Readings from Last 3 Encounters:  02/10/20 38.23 kg/m  11/07/19 38.48 kg/m  05/02/19 39.31 kg/m    Assessment/Interventions: Review of patient past medical history, allergies, medications, health status, including review of consultants reports, laboratory and other test data, was performed as part of comprehensive evaluation and provision of chronic care management services.   SDOH:  (Social Determinants of Health) assessments and interventions performed: No  SDOH Screenings   Alcohol Screen: Not on file  Depression (PHQ2-9): Low Risk    PHQ-2 Score: 0  Financial Resource Strain: Not on file  Food Insecurity: No Food Insecurity   Worried About Charity fundraiser in the Last Year: Never true   Ran Out of Food in the Last Year: Never true  Housing: Low Risk    Last Housing Risk Score: 0  Physical Activity: Inactive   Days of Exercise per Week: 0 days   Minutes of Exercise per Session: 0 min  Social Connections: Moderately Isolated   Frequency of Communication with Friends and Family: Three times a week   Frequency of Social Gatherings with  Friends and Family: Three times a week   Attends Religious Services: Never   Active Member of Clubs or Organizations: No   Attends Archivist Meetings: Never   Marital Status: Married  Stress: No Stress Concern Present   Feeling of Stress : Not at all  Tobacco Use: Medium Risk   Smoking Tobacco Use: Former   Smokeless Tobacco Use: Never  Transportation Needs: No Data processing manager (Medical): No   Lack of Transportation (Non-Medical): No    CCM Care Plan  Allergies  Allergen Reactions   Albuterol    Antihistamines, Loratadine-Type  Bee Venom    Clindamycin/Lincomycin    Codeine    Doxycycline    Lisinopril     REACTION: facial swelling   Metronidazole     REACTION: rash   Ondansetron Hcl     REACTION: rash   Oxycodone-Acetaminophen    Tylox [Oxycodone-Acetaminophen]    Vancomycin     REACTION: rash   Benadryl [Diphenhydramine] Nausea And Vomiting   Lidocaine Rash    Medications Reviewed Today     Reviewed by [name], MD (Physician) on 07/11/20 at 1047  Med List Status: <None>   Medication Order Taking? Sig Documenting Provider Last Dose Status Informant  aspirin 81 MG EC tablet [id number] No Take 81 mg by mouth daily. [provider] Taking Active   atorvastatin (LIPITOR) 10 MG tablet [id number] No TAKE ONE TABLET BY MOUTH ONCE DAILY Laurey Morale, MD Taking Active   cephALEXin (KEFLEX) 500 MG capsule [id number] No TAKE 1 CAPSULE BY MOUTH THREE TIMES A DAY Laurey Morale, MD Taking Active   cholecalciferol (VITAMIN D) 1000 UNITS tablet [id number] No Take 1,000 Units by mouth daily. [provider] Taking Active   Cinnamon 500 MG TABS [id number] No Take 2 tablets by mouth daily. [provider] Taking Active   clobetasol (TEMOVATE) 0.05 % external solution [id number] No Apply 1 application topically 2 (two) times daily. Laurey Morale, MD Taking Active   [facility] ULTRA THIN LANCETS [place] [id number] No  1 strip by Does not apply route as directed. Laurey Morale, MD Taking Active   Cyanocobalamin (VITAMIN B 12 PO) [id number] No Take 1,000 Units by mouth daily. [provider] Taking Active   cyclobenzaprine (FLEXERIL) 10 MG tablet [id number] No TAKE 1 TABLET BY MOUTH THREE TIMES A DAY AS NEEDED FOR MUSCLE SPASMS Laurey Morale, MD Taking Active   desonide (DESOWEN) 0.05 % cream [id number] No Apply topically 2 (two) times daily. [provider] Taking Active   diclofenac (VOLTAREN) 75 MG EC tablet [id number] No Take 1 tablet (75 mg total) by mouth 2 (two) times daily. Laurey Morale, MD Taking Active   diltiazem (CARDIZEM CD) 360 MG 24 hr capsule [id number] No Take 1 capsule (360 mg total) by mouth daily. Laurey Morale, MD Taking Active   diphenhydrAMINE (BENADRYL) 25 mg capsule [id number] No Take 25 mg by mouth as needed. [provider] Taking Active   EPINEPHrine 0.3 mg/0.3 mL IJ SOAJ injection [id number] No Inject 0.3 mg into the muscle as needed for anaphylaxis. Laurey Morale, MD Taking Active   fenofibrate 160 MG tablet [id number] No TAKE 1 TABLET BY MOUTH DAILY Laurey Morale, MD Taking Active   gabapentin (NEURONTIN) 300 MG capsule [id number] No TAKE 2 CAPSULES (600 MG TOTAL) BY MOUTH 3 (THREE) TIMES DAILY. Laurey Morale, MD Taking Active   glucose blood (TRUETEST TEST) test strip [id number] No Use as instructed Laurey Morale, MD Taking Active   hydrochlorothiazide (HYDRODIURIL) 25 MG tablet [id number] No TAKE 1 TABLET BY MOUTH EVERY DAY Laurey Morale, MD Taking Active   HYDROmorphone (DILAUDID) 4 MG tablet [id number] No Take 1 tablet (4 mg total) by mouth every 6 (six) hours as needed for severe pain. Laurey Morale, MD Taking Active   levothyroxine (SYNTHROID) 50 MCG tablet [id number] No TAKE ONE TABLET BY MOUTH ONCE DAILY Laurey Morale, MD Taking Active   metFORMIN (GLUCOPHAGE-XR) 500 MG 24 hr tablet [id number] No TAKE 1 TABLET BY MOUTH TWICE A DAY WITH MEAL  Laurey Morale,  MD Taking Active   metoprolol succinate (TOPROL-XL) 100 MG 24 hr tablet [id number] No TAKE 1 TABLET BY MOUTH DAILY. TAKE WITH OR IMMEDIATELY FOLLOWING A MEAL Laurey Morale, MD Taking Active   mupirocin ointment (BACTROBAN) 2 % [id number] No Apply topically 3 (three) times daily. Laurey Morale, MD Taking Active   omeprazole (PRILOSEC) 20 MG capsule [id number] No TAKE 1 CAPSULE BY MOUTH TWICE A DAY Laurey Morale, MD Taking Active   Discontinued 09/22/11 [phone] (Reorder)   potassium chloride (KLOR-CON M10) 10 MEQ tablet [id number] No TAKE 1 TABLET BY MOUTH EVERY DAY Laurey Morale, MD Taking Active   promethazine (PHENERGAN) 25 MG tablet [id number] No Take 1 tablet (25 mg total) by mouth every 6 (six) hours as needed for nausea. Laurey Morale, MD Taking Active   Semaglutide,0.25 or 0.5MG/DOS, (OZEMPIC, 0.25 OR 0.5 MG/DOSE,) 2 MG/1.5ML SOPN [id number] No Inject 0.5 mg into the skin once a week. Laurey Morale, MD Taking Active   sulfamethoxazole-trimethoprim (BACTRIM DS) 800-160 MG tablet [id number] No Take 1 tablet by mouth 2 (two) times daily. Laurey Morale, MD Taking Active   temazepam (RESTORIL) 30 MG capsule [id number] No Take 1 capsule (30 mg total) by mouth at bedtime as needed for sleep. Laurey Morale, MD Taking Active   traMADol Veatrice [place]) 50 MG tablet [id number] No TAKE TWO TABLETS BY MOUTH THREE TIMES DAILY AS NEEDED FOR moderate pain Laurey Morale, MD Taking Active   triamcinolone cream (KENALOG) 0.1 % [id number] No Apply 1 application topically 2 (two) times daily. Laurey Morale, MD Taking Active   venlafaxine XR (EFFEXOR-XR) 150 MG 24 hr capsule [id number] No TAKE 1 CAPSULE (150 MG TOTAL) BY MOUTH DAILY WITH BREAKFAST. Laurey Morale, MD Taking Active   vitamin B-12 (CYANOCOBALAMIN) 1000 MCG tablet [id number] No Take 1,000 mcg by mouth daily. 2500 mcg once daily [provider] Taking Active Self  vitamin E 180 MG (400 UNITS) capsule [id number] No Take 400 Units by mouth daily. [provider] Taking Active Self            Patient Active Problem List   Diagnosis Date Noted   Hyperlipidemia    Depression with anxiety 07/13/2019   Psoriasis 10/29/2016   Morbid obesity ([place]) 01/04/2016   Hypothyroidism 04/05/2014   Type 2 diabetes mellitus, uncontrolled 03/06/2014   NECK PAIN 11/28/2009   BRACHIAL NEURITIS OR RADICULITIS NOS 11/28/2009   B12 deficiency 09/14/2009   HYPERTRIGLYCERIDEMIA 09/14/2009   Diabetic neuropathy ([place]) 09/11/2009   ECZEMA 06/07/2009   Anxiety state 07/26/2008   [place] DISEASE, LUMBAR 06/22/2007   ARTHRITIS 06/11/2007   LOW BACK PAIN 12/28/2006   Essential hypertension 09/11/2006   Asthma 09/11/2006   GERD 09/11/2006   History of colonic polyps 09/11/2006    Immunization History  Administered Date(s) Administered   Influenza Whole 12/27/2004, 11/27/2008   Influenza,inj,Quad PF,6+ Mos 10/08/2012, 10/05/2013, 10/06/2014, 01/04/2016, 10/29/2016, 12/23/2017, 11/30/2018, 11/07/2019   PFIZER(Purple Top)SARS-COV-2 Vaccination 05/20/2019, 06/15/2019   Pneumococcal Polysaccharide-23 01/16/2020   Tdap 04/01/2017   Patient reports she doesn't want to go back to the neurosurgeon she went to because she didn't want an injection. She wants to go to Dr. Druscilla Brownie in [facility] and needs to find out if her insurance covers it. Patient is still worried about her daughter who is in a nursing home in [place] and she sees her once a week. Her daughter is not communicating well and does not like it  down there.  Conditions to be addressed/monitored:  Hypertension, Hyperlipidemia, Diabetes, GERD, Hypothyroidism, Anxiety, Osteoarthritis and insomnia, vitamin B12 deficiency  Conditions addressed this visit: Diabetes, hypertension  Care Plan : CCM Pharmacy Care Plan  Updates made by Viona Gilmore, [place] since 11/06/2020 12:00 AM     Problem: Problem: Hypertension, Hyperlipidemia, Diabetes, GERD, Hypothyroidism, Anxiety, Osteoarthritis and  insomnia, vitamin B12 deficiency      Long-Range Goal: Patient-Specific Goal   Start Date: 04/27/2020  Expected End Date: 04/27/2021  Recent Progress: Not on track  Priority: High  Note:   Current Barriers:  Unable to independently monitor therapeutic efficacy Unable to achieve control of diabetes  Unable to maintain control of blood pressure Suboptimal therapeutic regimen for cholesterol   Pharmacist Clinical Goal(s):  Patient will achieve adherence to monitoring guidelines and medication adherence to achieve therapeutic efficacy achieve control of diabetes as evidenced by A1c  through collaboration with PharmD and provider.   Interventions: 1:1 collaboration with Laurey Morale, MD regarding development and update of comprehensive plan of care as evidenced by provider attestation and co-signature Inter-disciplinary care team collaboration (see longitudinal plan of care) Comprehensive medication review performed; medication list updated in electronic medical record  Hypertension (BP goal <140/90) -Uncontrolled -Current treatment: diltiazem (Cardizem CD) 355m, 1 capsule daily - in PM metoprolol succinate 1032monce daily  - in AM -Medications previously tried: lisinopril (facial swelling) metoprolol succinate 10051mHCTZ 51m13matient reports these were stopped when she started diltiazem) -Current home readings: 190/103, 191/109, 144/73, 180-190s (once or twice a week) -Current dietary habits: did not discuss -Current exercise habits: no structured exercise; limited in mobility -Reports hypotensive/hypertensive symptoms -Educated on BP goals and benefits of medications for prevention of heart attack, stroke and kidney damage; Daily salt intake goal < 2300 mg; Exercise goal of 150 minutes per week; Importance of home blood pressure monitoring; Proper BP monitoring technique; -Counseled to monitor BP at home a few times weekly, document, and provide log at future  appointments -Counseled on diet and exercise extensively Recommended to continue current medication Recommended restarting HCTZ.  Hyperlipidemia: (LDL goal < 100) -Controlled -Current treatment: atorvastatin 10mg64mtablet daily  fenofibrate 160mg,86mablet daily -Medications previously tried: none  -Current dietary patterns: patient is no longer eating red meat -Current exercise habits: limited in mobility -Educated on Cholesterol goals;  Benefits of statin for ASCVD risk reduction; Importance of limiting foods high in cholesterol; Exercise goal of 150 minutes per week; -Counseled on diet and exercise extensively Recommended to continue current medication Collaborated with PCP on management of triglycerides with additional medication adjustment  Diabetes (A1c goal <7%) -Uncontrolled -Current medications: semaglutide (Ozempic) 1 mg once a week  metformin XR 500mg 158mlet twice daily  -Medications previously tried: glipizide  -Current home glucose readings fasting glucose: 159-240 (checking always in the morning) post prandial glucose: could not provide numbers -Denies hypoglycemic/hyperglycemic symptoms -Current meal patterns: sometimes eating once or twice a day (needs to eat cheaper/easier foods) breakfast: cinnamon poptart  lunch: did not discuss dinner: did not discuss snacks: did not discuss drinks: half and half tea, water -Current exercise: limited in mobility -Educated on A1c and blood sugar goals; Exercise goal of 150 minutes per week; Benefits of routine self-monitoring of blood sugar; Carbohydrate counting and/or plate method -Counseled to check feet daily and get yearly eye exams -Counseled on diet and exercise extensively Recommended to continue current medication Recommended increasing Ozempic to 2 mg weekly and Freestyle libre for blood sugar monitoring.  Depression/Anxiety (Goal: minimize symptoms) -Controlled -Current treatment: venlafaxine XR  155m, 1 capsule one daily with breakfast  -Medications previously tried/failed: sertraline -PHQ9: 14  -GAD7: n/a -Educated on Benefits of medication for symptom control -Recommended to continue current medication  Insomnia (Goal: improve quality and quantity of sleep) -Controlled -Current treatment  temazepam 342m 1 capsule at bedtime as needed for sleep Melatonin 10 mg, 1 capsule at bedtime  -Medications previously tried: n/a  -Counseled on non-pharmacological interventions for insomnia. (Avoid napping, limit exposure to technology near bedtime, etc). ; discussed trying 5 mg dose instead of 10 mg and stressed the importance of timed release to help with staying asleep  Hypothyroidism (Goal: TSH 0.35-4.5) -Controlled -Current treatment  levothyroxine 5069m 1 tablet daily -Medications previously tried: none  -Recommended to continue current medication  Arthritis/muscle spasms (Goal: minimize pain) -Controlled -Current treatment  diclofenac 64m13m tablet twice daily - tries to not take scheduled  tramadol 50mg26mtablets three times daily as needed for moderate pain  hydromorphone 4mg, 22mablet every six hours as needed for severe pain   cyclobenzaprine 10mg, 64mblet three times daily as needed for muscle spasms  benadryl 25 mg - takes this only with the hydromorphone Voltaren gel (diclofenac) as needed -Medications previously tried: Opana (oxymorphone) -Recommended to continue current medication  Vitamin B12 deficiency (Goal: 211-911) -Not ideally controlled -Current treatment  Vitamin B12 1000 mcg 1 tablet daily -Medications previously tried: none  -Recommended decreasing to every other day and then buying 500 mcg to take daily to avoid oversupplementation  GERD (Goal: minimize symptoms) -Controlled -Current treatment  omeprazole 20mg, 131msule twice daily -Medications previously tried: none  -Counseled on non-pharmacological interventions for acid reflux. Take  measures to prevent acid reflux, such as avoiding spicy foods, avoiding caffeine, avoid laying down a few hours after eating, and raising the head of the bed.  Neuropathy (Goal: minimize pain) -Controlled -Current treatment  gabapentin 300mg, 2 45mules three times daily  -Medications previously tried: none  -Recommended to continue current medication  Skin lesions (Goal: minimize symptoms) -Controlled -Current treatment  mupirocin 2% ointment, apply three times daily  Bactrim DS, 1 tablet twice daily triamcinolone 0.1% cream, apply twice daily -Medications previously tried: none  -Recommended to continue current medication   Health Maintenance -Vaccine gaps: shingles, COVID booster -Current therapy:  Biotin 5000 mcg (collagen support) 1 tablet daily Aspirin 81 mg 1 tablet daily -Educated on Cost vs benefit of each product must be carefully weighed by individual consumer -Patient is satisfied with current therapy and denies issues -Recommended to continue current medication  Patient Goals/Self-Care Activities Patient will:  - take medications as prescribed check glucose daily, document, and provide at future appointments check blood pressure a few times a week, document, and provide at future appointments engage in dietary modifications by adding more vegetables to her diet  Follow Up Plan: Telephone follow up appointment with care management team member scheduled for: 2 months       Medication Assistance:  Ozempic obtained through [employer] medication assistance program.  Enrollment ends 01/26/21  Compliance/Adherence/Medication fill history: Care Gaps: Foot exam, eye exam, PAP smear, urine microalbumin, shingrix, eye exam, COVID booster, influenza   Star-Rating Drugs: Atorvastatin 10mg - la56milled on 09/11/2020 90DS at [facility] Metformin 500mg - las41mlled on 12/03/19 60DS at [facility] Semaglutide 2 mg - filled through PAP  Patient's preferred pharmacy  is:  [facility] [address] [place] [address] Phone: [phone] Fax: [phone]  Uses pill box? Yes - in AM/PM - night pills in old pill bottles (10 bottles), keeping other pills (out of sight) Pt endorses 100% compliance  We discussed: Benefits of medication synchronization, packaging and delivery as well as enhanced pharmacist oversight with [facility]. Patient decided to: Utilize [facility] pharmacy for medication synchronization, packaging and delivery  Care Plan and Follow Up Patient Decision:  Patient agrees to Care Plan and Follow-up.  Plan: The care management team will reach out to the patient again over the next 30 days.  [name], PharmD, [facility] [place]  Coordinated fill from [facility] for the following medications: -Fenofibrate -Venlafaxine -Metoprolol -Potassium chloride -Ozempic (patient assistance medication)

## 2020-11-05 NOTE — Telephone Encounter (Signed)
Patient called asking if MRI and written reports can be faxed to Dr. Wynn Banker # [phone]

## 2020-11-06 NOTE — Patient Instructions (Signed)
Hi Christina [name],  It was great to catch up with you again! I will call you once I hear back from Dr. Sarajane Jews.  Please reach out to me if you have any questions or need anything before our follow up!  Best, Maddie  [name], PharmD, [facility]   Visit Information   Goals Addressed   None    Patient Care Plan: CCM Pharmacy Care Plan     Problem Identified: Problem: Hypertension, Hyperlipidemia, Diabetes, GERD, Hypothyroidism, Anxiety, Osteoarthritis and insomnia, vitamin B12 deficiency      Long-Range Goal: Patient-Specific Goal   Start Date: 04/27/2020  Expected End Date: 04/27/2021  Recent Progress: Not on track  Priority: High  Note:   Current Barriers:  Unable to independently monitor therapeutic efficacy Unable to achieve control of diabetes  Unable to maintain control of blood pressure Suboptimal therapeutic regimen for cholesterol   Pharmacist Clinical Goal(s):  Patient will achieve adherence to monitoring guidelines and medication adherence to achieve therapeutic efficacy achieve control of diabetes as evidenced by A1c  through collaboration with PharmD and provider.   Interventions: 1:1 collaboration with Laurey Morale, MD regarding development and update of comprehensive plan of care as evidenced by provider attestation and co-signature Inter-disciplinary care team collaboration (see longitudinal plan of care) Comprehensive medication review performed; medication list updated in electronic medical record  Hypertension (BP goal <140/90) -Uncontrolled -Current treatment: diltiazem (Cardizem CD) 360mg , 1 capsule daily - in PM metoprolol succinate 100mg  once daily  - in AM -Medications previously tried: lisinopril (facial swelling) metoprolol succinate 100mg , HCTZ 25mg  (patient reports these were stopped when she started diltiazem) -Current home readings: 190/103, 191/109, 144/73, 180-190s (once or twice a  week) -Current dietary habits: did not discuss -Current exercise habits: no structured exercise; limited in mobility -Reports hypotensive/hypertensive symptoms -Educated on BP goals and benefits of medications for prevention of heart attack, stroke and kidney damage; Daily salt intake goal < 2300 mg; Exercise goal of 150 minutes per week; Importance of home blood pressure monitoring; Proper BP monitoring technique; -Counseled to monitor BP at home a few times weekly, document, and provide log at future appointments -Counseled on diet and exercise extensively Recommended to continue current medication Recommended restarting HCTZ.  Hyperlipidemia: (LDL goal < 100) -Controlled -Current treatment: atorvastatin 10mg , 1 tablet daily  fenofibrate 160mg , 1 tablet daily -Medications previously tried: none  -Current dietary patterns: patient is no longer eating red meat -Current exercise habits: limited in mobility -Educated on Cholesterol goals;  Benefits of statin for ASCVD risk reduction; Importance of limiting foods high in cholesterol; Exercise goal of 150 minutes per week; -Counseled on diet and exercise extensively Recommended to continue current medication Collaborated with PCP on management of triglycerides with additional medication adjustment  Diabetes (A1c goal <7%) -Uncontrolled -Current medications: semaglutide (Ozempic) 1 mg once a week  metformin XR 500mg  1 tablet twice daily  -Medications previously tried: glipizide  -Current home glucose readings fasting glucose: 159-240 (checking always in the morning) post prandial glucose: could not provide numbers -Denies hypoglycemic/hyperglycemic symptoms -Current meal patterns: sometimes eating once or twice a day (needs to eat cheaper/easier foods) breakfast: cinnamon poptart  lunch: did not discuss dinner: did not discuss snacks: did not discuss drinks: half and half tea, water -Current exercise: limited in  mobility -Educated on A1c and blood sugar goals; Exercise goal of 150 minutes per week; Benefits of routine self-monitoring of blood sugar; Carbohydrate counting and/or plate method -Counseled to check feet  daily and get yearly eye exams -Counseled on diet and exercise extensively Recommended to continue current medication Recommended increasing Ozempic to 2 mg weekly and Freestyle libre for blood sugar monitoring.  Depression/Anxiety (Goal: minimize symptoms) -Controlled -Current treatment: venlafaxine XR 150mg , 1 capsule one daily with breakfast  -Medications previously tried/failed: sertraline -PHQ9: 14  -GAD7: n/a -Educated on Benefits of medication for symptom control -Recommended to continue current medication  Insomnia (Goal: improve quality and quantity of sleep) -Controlled -Current treatment  temazepam 30mg , 1 capsule at bedtime as needed for sleep Melatonin 10 mg, 1 capsule at bedtime  -Medications previously tried: n/a  -Counseled on non-pharmacological interventions for insomnia. (Avoid napping, limit exposure to technology near bedtime, etc). ; discussed trying 5 mg dose instead of 10 mg and stressed the importance of timed release to help with staying asleep  Hypothyroidism (Goal: TSH 0.35-4.5) -Controlled -Current treatment  levothyroxine 30mcg, 1 tablet daily -Medications previously tried: none  -Recommended to continue current medication  Arthritis/muscle spasms (Goal: minimize pain) -Controlled -Current treatment  diclofenac 75mg , 1 tablet twice daily - tries to not take scheduled  tramadol 50mg , 2 tablets three times daily as needed for moderate pain  hydromorphone 4mg , 1 tablet every six hours as needed for severe pain   cyclobenzaprine 10mg , 1 tablet three times daily as needed for muscle spasms  benadryl 25 mg - takes this only with the hydromorphone Voltaren gel (diclofenac) as needed -Medications previously tried: Opana (oxymorphone) -Recommended  to continue current medication  Vitamin B12 deficiency (Goal: 211-911) -Not ideally controlled -Current treatment  Vitamin B12 1000 mcg 1 tablet daily -Medications previously tried: none  -Recommended decreasing to every other day and then buying 500 mcg to take daily to avoid oversupplementation  GERD (Goal: minimize symptoms) -Controlled -Current treatment  omeprazole 20mg , 1 capsule twice daily -Medications previously tried: none  -Counseled on non-pharmacological interventions for acid reflux. Take measures to prevent acid reflux, such as avoiding spicy foods, avoiding caffeine, avoid laying down a few hours after eating, and raising the head of the bed.  Neuropathy (Goal: minimize pain) -Controlled -Current treatment  gabapentin 300mg , 2 capsules three times daily  -Medications previously tried: none  -Recommended to continue current medication  Skin lesions (Goal: minimize symptoms) -Controlled -Current treatment  mupirocin 2% ointment, apply three times daily  Bactrim DS, 1 tablet twice daily triamcinolone 0.1% cream, apply twice daily -Medications previously tried: none  -Recommended to continue current medication   Health Maintenance -Vaccine gaps: shingles, COVID booster -Current therapy:  Biotin 5000 mcg (collagen support) 1 tablet daily Aspirin 81 mg 1 tablet daily -Educated on Cost vs benefit of each product must be carefully weighed by individual consumer -Patient is satisfied with current therapy and denies issues -Recommended to continue current medication  Patient Goals/Self-Care Activities Patient will:  - take medications as prescribed check glucose daily, document, and provide at future appointments check blood pressure a few times a week, document, and provide at future appointments engage in dietary modifications by adding more vegetables to her diet  Follow Up Plan: Telephone follow up appointment with care management team member scheduled for:  2 months       Patient verbalizes understanding of instructions provided today and agrees to view in [place].  Telephone follow up appointment with pharmacy team member scheduled for: 2 months  Viona Gilmore, [facility]

## 2020-11-07 NOTE — Telephone Encounter (Signed)
Left pt a detailed message to contact the Dr who ordered her MRI and have them send the report to Dr Glendell Docker office

## 2020-11-08 ENCOUNTER — Telehealth: Payer: Self-pay | Admitting: Family Medicine

## 2020-11-08 MED ORDER — FREESTYLE LIBRE 14 DAY READER DEVI: 1.0000 "application " | 11 refills | Status: DC

## 2020-11-08 MED ORDER — FREESTYLE LIBRE 14 DAY SENSOR MISC: 1.0000 "application " | 11 refills | Status: DC

## 2020-11-08 NOTE — Telephone Encounter (Signed)
She will try the [facility]

## 2020-11-09 NOTE — Telephone Encounter (Signed)
Attempted to call pt again with no success, Send pt  a MyChart message with advise

## 2020-11-16 ENCOUNTER — Encounter: Payer: Self-pay | Admitting: Family Medicine

## 2020-11-16 ENCOUNTER — Ambulatory Visit (INDEPENDENT_AMBULATORY_CARE_PROVIDER_SITE_OTHER): Payer: PPO | Admitting: Family Medicine

## 2020-11-16 ENCOUNTER — Ambulatory Visit: Payer: PPO | Admitting: Pharmacist

## 2020-11-16 ENCOUNTER — Other Ambulatory Visit: Payer: Self-pay

## 2020-11-16 VITALS — BP 150/78 | HR 87 | Temp 98.7°F | Wt 210.2 lb

## 2020-11-16 DIAGNOSIS — R32 Unspecified urinary incontinence: Secondary | ICD-10-CM

## 2020-11-16 DIAGNOSIS — Z23 Encounter for immunization: Secondary | ICD-10-CM | POA: Diagnosis not present

## 2020-11-16 DIAGNOSIS — I10 Essential (primary) hypertension: Secondary | ICD-10-CM | POA: Diagnosis not present

## 2020-11-16 DIAGNOSIS — E038 Other specified hypothyroidism: Secondary | ICD-10-CM | POA: Diagnosis not present

## 2020-11-16 DIAGNOSIS — E119 Type 2 diabetes mellitus without complications: Secondary | ICD-10-CM

## 2020-11-16 DIAGNOSIS — E1165 Type 2 diabetes mellitus with hyperglycemia: Secondary | ICD-10-CM

## 2020-11-16 LAB — CBC WITH DIFFERENTIAL/PLATELET
Basophils Absolute: 0 10*3/uL (ref 0.0–0.1)
Basophils Relative: 0.3 % (ref 0.0–3.0)
Eosinophils Absolute: 0.1 10*3/uL (ref 0.0–0.7)
Eosinophils Relative: 1 % (ref 0.0–5.0)
HCT: 38.8 % (ref 36.0–46.0)
Hemoglobin: 13 g/dL (ref 12.0–15.0)
Lymphocytes Relative: 21.8 % (ref 12.0–46.0)
Lymphs Abs: 1.5 10*3/uL (ref 0.7–4.0)
MCHC: 33.4 g/dL (ref 30.0–36.0)
MCV: 88.2 fl (ref 78.0–100.0)
Monocytes Absolute: 0.4 10*3/uL (ref 0.1–1.0)
Monocytes Relative: 5.2 % (ref 3.0–12.0)
Neutro Abs: 5 10*3/uL (ref 1.4–7.7)
Neutrophils Relative %: 71.7 % (ref 43.0–77.0)
Platelets: 186 10*3/uL (ref 150.0–400.0)
RBC: 4.4 Mil/uL (ref 3.87–5.11)
RDW: 14.4 % (ref 11.5–15.5)
WBC: 7 10*3/uL (ref 4.0–10.5)

## 2020-11-16 LAB — BASIC METABOLIC PANEL
BUN: 19 mg/dL (ref 6–23)
CO2: 28 mEq/L (ref 19–32)
Calcium: 9.3 mg/dL (ref 8.4–10.5)
Chloride: 102 mEq/L (ref 96–112)
Creatinine, Ser: 0.98 mg/dL (ref 0.40–1.20)
GFR: 64.65 mL/min (ref >60.00)
Glucose, Bld: 145 mg/dL — ABNORMAL HIGH (ref 70–99)
Potassium: 4.4 mEq/L (ref 3.5–5.1)
Sodium: 139 mEq/L (ref 135–145)

## 2020-11-16 LAB — POCT GLYCOSYLATED HEMOGLOBIN (HGB A1C): Hemoglobin A1C: 7.2 % — AB (ref 4.0–5.6)

## 2020-11-16 LAB — TSH: TSH: 2.08 u[IU]/mL (ref 0.35–5.50)

## 2020-11-16 LAB — T3, FREE: T3, Free: 3.3 pg/mL (ref 2.3–4.2)

## 2020-11-16 LAB — T4, FREE: Free T4: 1 ng/dL (ref 0.60–1.60)

## 2020-11-16 MED ORDER — SEMAGLUTIDE (2 MG/DOSE) 8 MG/3ML ~~LOC~~ SOPN: 2.0000 mg | PEN_INJECTOR | SUBCUTANEOUS | 11 refills | Status: DC

## 2020-11-16 MED ORDER — SPIRONOLACTONE 25 MG PO TABS: 25.0000 mg | ORAL_TABLET | Freq: Every day | ORAL | 3 refills | Status: DC

## 2020-11-16 MED ORDER — METFORMIN HCL ER 500 MG PO TB24: ORAL_TABLET | ORAL | 3 refills | Status: DC

## 2020-11-16 NOTE — Progress Notes (Signed)
   Subjective:    Patient ID: Christina [name], female    DOB: 07-14-1965, 55 y.o.   MRN: [id number]  HPI Here to follow up on DM and HTN. Her BP at home has been running a bit higher lately, often in the 150s or 160s over 90s. She has been working on her diet and she has lost some weight. Her A1c today is down to 7.2. She also mentions urinary incontinence. She often simply passes urine without any sensation of urgency. There is no discomfort. She has been under a lot of stress due to family medical issues, primarily about her daughter who had a stroke in August, and who now lives in skilled nursing facility in [place], [place]. [name] and her husband drive down there once a week to visit her. She is working with our pharmacist to get the [employer] CGM system.   Review of Systems  Constitutional: Negative.   Respiratory: Negative.    Cardiovascular: Negative.   Genitourinary:  Negative for difficulty urinating, dysuria, hematuria, pelvic pain and urgency.      Objective:   Physical Exam Constitutional:      Appearance: Normal appearance.     Comments: In her wheelchair   Cardiovascular:     Rate and Rhythm: Normal rate and regular rhythm.     Pulses: Normal pulses.     Heart sounds: Normal heart sounds.  Pulmonary:     Effort: Pulmonary effort is normal.     Breath sounds: Normal breath sounds.  Musculoskeletal:     Right lower leg: No edema.     Left lower leg: No edema.  Neurological:     Mental Status: She is alert.          Assessment & Plan:  Her HTN is not well controlled so we will add Spironolactone 25 mg daily to her Metoprolol and Diltiazem. For the diabetes we will increase the Semaglutide to 2 mg weekly. For the urinary incontinence, we will refer her to Urology. Recheck in 3-4 weeks. We spent a total of ( 35  ) minutes reviewing records and discussing these issues.  Alysia Penna, MD

## 2020-11-16 NOTE — Progress Notes (Signed)
   Chronic Care Management Pharmacy Note  11/16/2020 Name:  Christina [name] MRN:  [id number] DOB:  01-26-1966  Subjective: Christina [name] is an 55 y.o. year old female who is a primary patient of Laurey Morale, MD.  The CCM team was consulted for assistance with disease management and care coordination needs.    Engaged with patient face to face for follow up visit in response to provider referral for pharmacy case management and/or care coordination services.   Patient brought Freestyle Libre CGM supplies and presents today for education on use and initial placement.  I have reviewed proper use, including but not limited to: glucose direction and how to incorporate it in treatment decisions including meal insulin and exercise, evaluating previous evening trends first thing in the morning. Sensor lag was explained.   Procedure and information discussed with patient. Patient has no further questions at this time. Verbalizes understanding and agrees to have CGM placed.  Posterior aspect of right arm cleaned and prepared as instructed by manufactures instructions.    Freestyle Libre CGM placed to posterior aspect of right arm. No redness, swelling, bleeding, or bruising noted.   Cleaned and prepared as instructed by manufacturers instructions. Freestyle Libre CGM placed as instructed by manufacturers instructions. No redness, swelling, bleeding, or bruising noted. Sensor activated and monitoring at this time. Patient is aware to resume normal activities including bathing, getting dressed, and exercise.   Follow-up in 14 days with pharmacist or as instructed by provider.

## 2020-11-20 ENCOUNTER — Encounter: Payer: Self-pay | Admitting: Family Medicine

## 2020-11-20 ENCOUNTER — Telehealth: Payer: Self-pay | Admitting: Pharmacist

## 2020-11-20 NOTE — Chronic Care Management (AMB) (Signed)
Chronic Care Management Pharmacy Assistant   Name: Christina [name]  MRN: [id number] DOB: Mar 31, 1965  Reason for Encounter: Patient assistance for Ozempic Application for Ozempic with instructions on how to complete, to include copy of her income and a return envelope addressed to [name] at [employer] mailed to patient 11/23/2020.  Medications: Outpatient Encounter Medications as of 11/20/2020  Medication Sig   aspirin 81 MG EC tablet Take 81 mg by mouth daily.   atorvastatin (LIPITOR) 10 MG tablet TAKE ONE TABLET BY MOUTH ONCE DAILY   celecoxib (CELEBREX) 200 MG capsule Take 1 capsule (200 mg total) by mouth 2 (two) times daily as needed.   cephALEXin (KEFLEX) 500 MG capsule TAKE 1 CAPSULE BY MOUTH THREE TIMES A DAY   cholecalciferol (VITAMIN D) 1000 UNITS tablet Take 1,000 Units by mouth daily.   Cinnamon 500 MG TABS Take 2 tablets by mouth daily.   clobetasol (TEMOVATE) 0.05 % external solution Apply 1 application topically 2 (two) times daily.   Continuous Blood Gluc Receiver (FREESTYLE LIBRE 14 DAY READER) DEVI 1 application by Does not apply route every 14 (fourteen) days.   Continuous Blood Gluc Sensor (FREESTYLE LIBRE 14 DAY SENSOR) MISC 1 application by Does not apply route every 14 (fourteen) days.   [employer] ULTRA THIN LANCETS MISC 1 strip by Does not apply route as directed.   cyclobenzaprine (FLEXERIL) 10 MG tablet TAKE 1 TABLET BY MOUTH THREE TIMES A DAY AS NEEDED FOR MUSCLE SPASMS   desonide (DESOWEN) 0.05 % cream Apply topically 2 (two) times daily.   diclofenac (VOLTAREN) 75 MG EC tablet TAKE ONE TABLET BY MOUTH TWICE DAILY   diltiazem (CARDIZEM CD) 360 MG 24 hr capsule Take 1 capsule (360 mg total) by mouth daily.   diphenhydrAMINE (BENADRYL) 25 mg capsule Take 25 mg by mouth as needed.   EPINEPHrine 0.3 mg/0.3 mL IJ SOAJ injection Inject 0.3 mg into the muscle as needed for anaphylaxis.   fenofibrate 160 MG tablet Take 1 tablet (160 mg total) by mouth daily.  *Appointment required for future refills*   gabapentin (NEURONTIN) 300 MG capsule TAKE 2 CAPSULES (600 MG TOTAL) BY MOUTH 3 (THREE) TIMES DAILY.   glucose blood (TRUETEST TEST) test strip Use as instructed   HYDROmorphone (DILAUDID) 4 MG tablet Take 1 tablet (4 mg total) by mouth every 6 (six) hours as needed for severe pain.   levothyroxine (SYNTHROID) 50 MCG tablet TAKE ONE TABLET BY MOUTH ONCE DAILY   metFORMIN (GLUCOPHAGE-XR) 500 MG 24 hr tablet TAKE 1 TABLET BY MOUTH TWICE A DAY WITH MEAL   metoprolol succinate (TOPROL-XL) 100 MG 24 hr tablet TAKE ONE TABLET BY MOUTH ONCE DAILY WITH FOOD   mupirocin ointment (BACTROBAN) 2 % Apply topically 3 (three) times daily.   omeprazole (PRILOSEC) 20 MG capsule Take 1 capsule (20 mg total) by mouth 2 (two) times daily.   potassium chloride (KLOR-CON) 10 MEQ tablet TAKE ONE TABLET BY MOUTH ONCE DAILY   promethazine (PHENERGAN) 25 MG tablet Take 1 tablet (25 mg total) by mouth every 6 (six) hours as needed for nausea.   Semaglutide, 2 MG/DOSE, 8 MG/3ML SOPN Inject 2 mg as directed once a week.   spironolactone (ALDACTONE) 25 MG tablet Take 1 tablet (25 mg total) by mouth daily.   sulfamethoxazole-trimethoprim (BACTRIM DS) 800-160 MG tablet Take 1 tablet by mouth 2 (two) times daily.   temazepam (RESTORIL) 30 MG capsule Take ONE capsule by MOUTH AT bedtime as needed FOR SLEEP  traMADol (ULTRAM) 50 MG tablet TAKE TWO TABLETS BY MOUTH THREE TIMES DAILY AS NEEDED FOR moderate pain   triamcinolone cream (KENALOG) 0.1 % Apply 1 application topically 2 (two) times daily.   venlafaxine XR (EFFEXOR-XR) 150 MG 24 hr capsule Take 1 capsule (150 mg total) by mouth every morning. *appointment required for future refills*   vitamin B-12 (CYANOCOBALAMIN) 1000 MCG tablet Take 1,000 mcg by mouth daily. 2500 mcg once daily   vitamin E 180 MG (400 UNITS) capsule Take 400 Units by mouth daily.   [DISCONTINUED] potassium chloride (KLOR-CON 10) 10 MEQ CR tablet Take 1 tablet  (10 mEq total) by mouth daily.   No facility-administered encounter medications on file as of 11/20/2020.    Care Gaps: AWV - completed 06/14/20 Foot exam - never done. Ophthalmology exam - never done Hepatitis C screening - never done Pap smear  - never done Zoster vaccines - never done Urine microalbumin - overdue   Covid 19 vaccine booster - overdue Influenza vaccine - due  Star Rating Drugs: Atorvastatin 10mg  - last filled on 09/11/2020 90DS at [facility] Metformin 500mg  - last filled on 12/03/19 60DS at [facility] Semaglutide 2mg  - none listed in chart  [place] Pharmacist Assistant [phone]

## 2020-11-26 DIAGNOSIS — I10 Essential (primary) hypertension: Secondary | ICD-10-CM

## 2020-11-26 DIAGNOSIS — E119 Type 2 diabetes mellitus without complications: Secondary | ICD-10-CM

## 2020-11-26 DIAGNOSIS — E1165 Type 2 diabetes mellitus with hyperglycemia: Secondary | ICD-10-CM

## 2020-11-30 ENCOUNTER — Other Ambulatory Visit: Payer: Self-pay | Admitting: Family Medicine

## 2020-12-03 ENCOUNTER — Telehealth: Payer: Self-pay | Admitting: Pharmacist

## 2020-12-03 ENCOUNTER — Other Ambulatory Visit: Payer: Self-pay | Admitting: Family Medicine

## 2020-12-03 NOTE — Chronic Care Management (AMB) (Signed)
Chronic Care Management Pharmacy Assistant   Name: Christina [name]  MRN: [id number] DOB: 06-Jul-1965  Reason for Encounter: Disease State and Medication Review / Hypertension assessment and Medication coordination calls   Conditions to be addressed/monitored: HTN  Recent office visits:  None  Recent consult visits:  None  Hospital visits:  None  Medications: Outpatient Encounter Medications as of 12/03/2020  Medication Sig   aspirin 81 MG EC tablet Take 81 mg by mouth daily.   atorvastatin (LIPITOR) 10 MG tablet TAKE ONE TABLET BY MOUTH ONCE DAILY   celecoxib (CELEBREX) 200 MG capsule Take 1 capsule (200 mg total) by mouth 2 (two) times daily as needed.   cephALEXin (KEFLEX) 500 MG capsule TAKE 1 CAPSULE BY MOUTH THREE TIMES A DAY   cholecalciferol (VITAMIN D) 1000 UNITS tablet Take 1,000 Units by mouth daily.   Cinnamon 500 MG TABS Take 2 tablets by mouth daily.   clobetasol (TEMOVATE) 0.05 % external solution Apply 1 application topically 2 (two) times daily.   Continuous Blood Gluc Receiver (FREESTYLE LIBRE 14 DAY READER) DEVI 1 application by Does not apply route every 14 (fourteen) days.   Continuous Blood Gluc Sensor (FREESTYLE LIBRE 14 DAY SENSOR) MISC 1 application by Does not apply route every 14 (fourteen) days.   [employer] ULTRA THIN LANCETS MISC 1 strip by Does not apply route as directed.   cyclobenzaprine (FLEXERIL) 10 MG tablet TAKE 1 TABLET BY MOUTH THREE TIMES A DAY AS NEEDED FOR MUSCLE SPASMS   desonide (DESOWEN) 0.05 % cream Apply topically 2 (two) times daily.   diclofenac (VOLTAREN) 75 MG EC tablet TAKE ONE TABLET BY MOUTH TWICE DAILY   diltiazem (CARDIZEM CD) 360 MG 24 hr capsule Take 1 capsule (360 mg total) by mouth daily.   diphenhydrAMINE (BENADRYL) 25 mg capsule Take 25 mg by mouth as needed.   EPINEPHrine 0.3 mg/0.3 mL IJ SOAJ injection Inject 0.3 mg into the muscle as needed for anaphylaxis.   fenofibrate 160 MG tablet Take 1 tablet (160 mg total) by mouth  daily. *Appointment required for future refills*   gabapentin (NEURONTIN) 300 MG capsule TAKE 2 CAPSULES (600 MG TOTAL) BY MOUTH 3 (THREE) TIMES DAILY.   glucose blood (TRUETEST TEST) test strip Use as instructed   HYDROmorphone (DILAUDID) 4 MG tablet Take 1 tablet (4 mg total) by mouth every 6 (six) hours as needed for severe pain.   levothyroxine (SYNTHROID) 50 MCG tablet TAKE ONE TABLET BY MOUTH EVERY MORNING   metFORMIN (GLUCOPHAGE-XR) 500 MG 24 hr tablet TAKE 1 TABLET BY MOUTH TWICE A DAY WITH MEAL   metoprolol succinate (TOPROL-XL) 100 MG 24 hr tablet TAKE ONE TABLET BY MOUTH ONCE DAILY WITH FOOD   mupirocin ointment (BACTROBAN) 2 % Apply topically 3 (three) times daily.   omeprazole (PRILOSEC) 20 MG capsule Take 1 capsule (20 mg total) by mouth 2 (two) times daily.   potassium chloride (KLOR-CON) 10 MEQ tablet TAKE ONE TABLET BY MOUTH ONCE DAILY   promethazine (PHENERGAN) 25 MG tablet Take 1 tablet (25 mg total) by mouth every 6 (six) hours as needed for nausea.   Semaglutide, 2 MG/DOSE, 8 MG/3ML SOPN Inject 2 mg as directed once a week.   spironolactone (ALDACTONE) 25 MG tablet Take 1 tablet (25 mg total) by mouth daily.   sulfamethoxazole-trimethoprim (BACTRIM DS) 800-160 MG tablet Take 1 tablet by mouth 2 (two) times daily.   temazepam (RESTORIL) 30 MG capsule Take ONE capsule by MOUTH AT bedtime as needed  FOR SLEEP   traMADol (ULTRAM) 50 MG tablet TAKE TWO TABLETS BY MOUTH THREE TIMES DAILY AS NEEDED FOR moderate pain   triamcinolone cream (KENALOG) 0.1 % Apply 1 application topically 2 (two) times daily.   venlafaxine XR (EFFEXOR-XR) 150 MG 24 hr capsule Take 1 capsule (150 mg total) by mouth every morning. *appointment required for future refills*   vitamin B-12 (CYANOCOBALAMIN) 1000 MCG tablet Take 1,000 mcg by mouth daily. 2500 mcg once daily   vitamin E 180 MG (400 UNITS) capsule Take 400 Units by mouth daily.   [DISCONTINUED] potassium chloride (KLOR-CON 10) 10 MEQ CR tablet Take  1 tablet (10 mEq total) by mouth daily.   No facility-administered encounter medications on file as of 12/03/2020.   Fill History: atorvastatin 10 mg tablet 09/11/2020 90   FreeStyle Libre 2 Sensor kit 11/08/2020 28   cyclobenzaprine 10 mg tablet 09/19/2020 30   diclofenac sodium 75 mg tablet,delayed release 09/11/2020 90   fenofibrate 160 mg tablet 11/06/2020 90   gabapentin 300 mg capsule 09/11/2020 90   levothyroxine 50 mcg tablet 09/11/2020 90   metoprolol succinate ER 100 mg tablet,extended release 24 hr 11/06/2020 90   omeprazole 20 mg capsule,delayed release 09/11/2020 90   potassium chloride ER 10 mEq tablet,extended release(part/cryst) 11/06/2020 90   SPIRONOLACTONE 25MG TABLET 11/19/2020 86   temazepam 30 mg capsule 10/16/2020 30   venlafaxine ER 150 mg capsule,extended release 24 hr 11/06/2020 90   diltiazem CD 360 mg capsule,extended release 24 hr 09/11/2020   METFORMIN HYDROCHLORIDE ER 500MG TABLET EXTENDED RELEASE 24 HOUR 11/19/2020 86   tramadol 50 mg tablet 09/19/2020 40   Reviewed chart prior to disease state call. Spoke with patient regarding BP  Recent Office Vitals: BP Readings from Last 3 Encounters:  11/16/20 (!) 150/78  02/10/20 (!) 150/90  11/07/19 128/78   Pulse Readings from Last 3 Encounters:  11/16/20 87  02/10/20 81  11/07/19 92    Wt Readings from Last 3 Encounters:  11/16/20 210 lb 4 oz (95.4 kg)  02/10/20 212 lb 6.4 oz (96.3 kg)  11/07/19 210 lb 6.4 oz (95.4 kg)     Kidney Function Lab Results  Component Value Date/Time   CREATININE 0.98 11/16/2020 02:32 PM   CREATININE 1.05 02/10/2020 03:35 PM   CREATININE 1.21 (H) 05/02/2019 02:51 PM   CREATININE 1.31 (H) 01/04/2016 04:40 PM   GFR 64.65 11/16/2020 02:32 PM   GFRNONAA 94.60 09/11/2009 10:13 AM   GFRAA  05/03/2009 05:10 AM    >60        The eGFR has been calculated using the MDRD equation. This calculation has not been validated in all clinical situations. eGFR's  persistently <60 mL/min signify possible Chronic Kidney Disease.    BMP Latest Ref Rng & Units 11/16/2020 02/10/2020 05/02/2019  Glucose 70 - 99 mg/dL 145(H) 163(H) 149(H)  BUN 6 - 23 mg/dL 19 21 24(H)  Creatinine 0.40 - 1.20 mg/dL 0.98 1.05 1.21(H)  BUN/Creat Ratio 6 - 22 (calc) - NOT APPLICABLE -  Sodium 950 - 145 mEq/L 139 138 140  Potassium 3.5 - 5.1 mEq/L 4.4 4.4 4.6  Chloride 96 - 112 mEq/L 102 102 99  CO2 19 - 32 mEq/L 28 27 32  Calcium 8.4 - 10.5 mg/dL 9.3 9.4 9.1    Current antihypertensive regimen:  Diltiazem 360 mg daily Spironolactone 25 mg daily  How often are you checking your Blood Pressure? 3-5x per week  Current home BP readings: Patients last two readings were  144/88 and 149/93, she states her readings are always around 140/90  What recent interventions/DTPs have been made by any provider to improve Blood Pressure control since last CPP Visit: None  Any recent hospitalizations or ED visits since last visit with CPP? No  What diet changes have been made to improve Blood Pressure Control?  Patient does not typically eat breakfast if she does she eats something like a grilled chicken sandwich around lunchtime. Patient wakes up later usually but she does have a cup of coffee when she gets up everyday. Patient always eats dinner with her husband and has things like eggs and bacon or a bowl of oatmeal or grits. Patient has about 3 bottles of water a day.   What exercise is being done to improve your Blood Pressure Control?  Patient states she doesn't do any structured exercise, she does do some housework if she feels good.  Adherence Review: Is the patient currently on ACE/ARB medication? No  Does the patient have >5 day gap between last estimated fill dates? No   Reviewed chart for medication changes ahead of medication coordination call.  No OVs, Consults, or hospital visits since last care coordination call/Pharmacist visit. (If appropriate, list visit date,  provider name)  No medication changes indicated OR if recent visit, treatment plan here.  BP Readings from Last 3 Encounters:  11/16/20 (!) 150/78  02/10/20 (!) 150/90  11/07/19 128/78    Lab Results  Component Value Date   HGBA1C 7.2 (A) 11/16/2020     Patient obtains medications through  Vials without safety caps   90 Days   Last adherence delivery included: Levothyroxine (SYNTHROID) 50 mcg: take 1 tablet by mouth daily          Atorvastatin (LIPITOR) 10 mg: take 1 tablet by mouth daily. Cyclobenzaprine (FLEXERIL) 10 mg: take one tablet by mouth three times a day as needed for muscle spasms Tramadol (Ultram) 50 mg: take two tablets by mouth 3 times daily as needed. Diltiazem (CARDIZEM CD) 360 MG 24 hr: take one capsule (360 mg total) by mouth daily. Diclofenac (VOLTAREN) 75 mg: one tablet by mouth twice a day Gabapentin 393m - take 2 tablet at breakfast, 2 tablet at lunch and 2 tablets at bedtime. Temazepam (RESTORIL) 30 mg: take one capsule by mouth at bedtime as needed.    Patient declined meds last month due to received an acute fill for some and abundance on hand.  sulfamethoxazole-trimethoprim (BACTRIM DS) 800-160 MG tablet: Take 1 tablet by mouth 2 (two) times daily Semaglutide (OZEMPIC, 0.25 OR 0.5 MG/DOSE,) 2 MG/1.5ML SOPN Venlafaxine XR 150 mg 24 hr: one capsule every morning Potassium chloride (KLOR-CON 10) 10 MEQ CR: one tablet daily Fenofibrate 160 mg: one tablet daily Metformin (GLUCOPHAGE-XR) 500 MG 24 hr: One tablet twice a day Metoprolol succinate (TOPROL-XL) 100 MG 24 hr take 1 tablet by mouth daily   Patient is due for next adherence delivery on: 12/13/2020  Called patient and reviewed medications and coordinated delivery.  This delivery to include: Freestyle Libre 3 use as directed Levothyroxine 50 mcg 1 tablet every morning Atorvastatin 10 mg 1 tablet daily Diltiazem 360 mg 1 tablet daily Gabapentin 300 mg 2 capsules 3 times daily Diclofenac 75 mg  1 tablet 2 times daily Omeprazole 20 mg 1 capsule daily Cyclobenzaprine (FLEXERIL) 10 mg: take one tablet by mouth three times a day as needed for muscle spasms Tramadol (Ultram) 50 mg: take two tablets by mouth 3 times daily as needed.  Patient will need a short fill: None  Coordinated acute fill : None  Confirmed delivery date of 12/13/2020, advised patient that pharmacy will contact them the morning of delivery.   Care Gaps: AWV - completed 06/14/20 Foot exam - never done. Ophthalmology exam - never done Hepatitis C screening - never done Pap smear  - never done Zoster vaccines - never done Urine microalbumin - overdue   Covid 19 vaccine booster - overdue Last BP - 150/78 on 11/16/2020 Last A1C - 7.2 on 11/16/2020  Star Rating Drugs: Atorvastatin 10 mg - last filled 09/11/2020 90 [facility] at [facility] Metformin 536m - last filled on 11/19/2020 86DS at [facility] Semaglutide 246m- none listed in chart  [facility] Assistant [phone]

## 2020-12-10 ENCOUNTER — Telehealth: Payer: Self-pay

## 2020-12-10 NOTE — Telephone Encounter (Signed)
Last OV 11/16/20 recommendation was to "Recheck in 3-4 weeks". No noted appt at this time.  LVM requesting pt to return call to schedule appt with PCP.

## 2021-01-08 ENCOUNTER — Telehealth: Payer: Self-pay | Admitting: Pharmacist

## 2021-01-08 ENCOUNTER — Encounter: Payer: Self-pay | Admitting: Family Medicine

## 2021-01-08 NOTE — Telephone Encounter (Signed)
I already called her and took care of this so she should be all set!

## 2021-01-08 NOTE — Telephone Encounter (Signed)
Patient called back and she was able to get a free sensor coupon which she will forward to the pharmacy as well as [employer] will be sending her a sensor for free in the mail.

## 2021-01-08 NOTE — Telephone Encounter (Signed)
I will forward this to Maddy, who has been working with her on this

## 2021-01-08 NOTE — Telephone Encounter (Signed)
Patient called me as she is experiencing issues with her Freestyle libre sensors. She had one fall off in the shower and another one she applied and it was too painful to continue using so she had to remove it. She went to place the third and she is getting error messages as though it won't connect to her reader. Provided customer service number for [employer] to call to see if they could offer support and/or replacement sensors. Patient will call back once she finds out more.

## 2021-01-24 ENCOUNTER — Telehealth: Payer: Self-pay | Admitting: Pharmacist

## 2021-01-24 NOTE — Chronic Care Management (AMB) (Signed)
° ° °  Chronic Care Management Pharmacy Assistant   Name: Christina [name]  MRN: [id number] DOB: [birth year]  Reason for Encounter: Follow up patient assistance for Ozempic. Spoke with [name] at [employer], patient has been approved through 01/26/2022 and will receive shipments. Patient notified.   Care Gaps: AWV - completed 06/14/20 Foot exam - never done. Ophthalmology exam - never done Hepatitis C screening - never done Pap smear  - never done Zoster vaccines - never done Urine microalbumin - overdue   Covid 19 vaccine booster - overdue Last BP - 150/78 on 11/16/2020 Last A1C - 7.2 on 11/16/2020  Star Rating Drugs: Atorvastatin 10 mg - last filled 12/06/2020 90 DS at [facility] Metformin 500mg  - last filled on 11/19/2020 86DS at [facility] Semaglutide 2mg  - none listed in chart  [place] Pharmacist Assistant [phone]

## 2021-01-24 NOTE — Chronic Care Management (AMB) (Addendum)
° ° °  Chronic Care Management Pharmacy Assistant   Name: Christina [name]  MRN: [id number] DOB: 29-Apr-1965  01/29/2021 APPOINTMENT REMINDER   Called [name], No answer, left message of appointment on 01/29/2021 at 12:00 via telephone visit with [name] Pharm D. Notified to have all medications, supplements, blood pressure and/or blood sugar logs available during appointment and to return call if need to reschedule.  Care Gaps: AWV - completed 06/14/20 Foot exam - never done. Ophthalmology exam - never done Hepatitis C screening - never done Pap smear  - never done Zoster vaccines - never done Urine microalbumin - overdue   Covid 19 vaccine booster - overdue Last BP - 150/78 on 11/16/2020 Last A1C - 7.2 on 11/16/2020  Star Rating Drug: Atorvastatin 10 mg - last filled 12/06/2020 90 DS at [facility] Metformin 500mg  - last filled on 11/19/2020 86DS at [facility] Semaglutide 2mg  - none listed in chart  Any gaps in medications fill history?  No  [name]  Catering manager [phone]

## 2021-01-25 ENCOUNTER — Other Ambulatory Visit: Payer: Self-pay

## 2021-01-25 ENCOUNTER — Other Ambulatory Visit: Payer: Self-pay | Admitting: Family Medicine

## 2021-01-25 MED ORDER — SULFAMETHOXAZOLE-TRIMETHOPRIM 800-160 MG PO TABS: 1.0000 | ORAL_TABLET | Freq: Two times a day (BID) | ORAL | 3 refills | Status: DC

## 2021-01-25 MED ORDER — FENOFIBRATE 160 MG PO TABS: 160.0000 mg | ORAL_TABLET | Freq: Every day | ORAL | 0 refills | Status: DC

## 2021-01-25 MED ORDER — VENLAFAXINE HCL ER 150 MG PO CP24: 150.0000 mg | ORAL_CAPSULE | Freq: Every morning | ORAL | 0 refills | Status: DC

## 2021-01-25 NOTE — Telephone Encounter (Signed)
Last OV- 11/16/20 Last refill- 09/12/2019  No future appointment scheduled.   Can this patient receive a refill?

## 2021-01-29 ENCOUNTER — Ambulatory Visit (INDEPENDENT_AMBULATORY_CARE_PROVIDER_SITE_OTHER): Payer: PPO | Admitting: Pharmacist

## 2021-01-29 DIAGNOSIS — E1165 Type 2 diabetes mellitus with hyperglycemia: Secondary | ICD-10-CM

## 2021-01-29 DIAGNOSIS — I10 Essential (primary) hypertension: Secondary | ICD-10-CM

## 2021-01-29 NOTE — Patient Instructions (Signed)
Hi Christina [name],  It was great to catch up with you again! Please look for the spironolactone as the pharmacy said this was delivered with your other medications. Also, don't forget to follow up with Dr. Delane Ginger with gastroenterology for your trouble swallowing.  Please reach out to me if you have any questions or need anything before our follow up!  Best, Maddie  [name], PharmD, [facility] [place]   Visit Information   Goals Addressed   None    Patient Care Plan: CCM Pharmacy Care Plan     Problem Identified: Problem: Hypertension, Hyperlipidemia, Diabetes, GERD, Hypothyroidism, Anxiety, Osteoarthritis and insomnia, vitamin B12 deficiency      Long-Range Goal: Patient-Specific Goal   Start Date: 04/27/2020  Expected End Date: 04/27/2021  Recent Progress: Not on track  Priority: High  Note:   Current Barriers:  Unable to independently monitor therapeutic efficacy Unable to achieve control of diabetes  Unable to maintain control of blood pressure Suboptimal therapeutic regimen for cholesterol   Pharmacist Clinical Goal(s):  Patient will achieve adherence to monitoring guidelines and medication adherence to achieve therapeutic efficacy achieve control of diabetes as evidenced by A1c  through collaboration with PharmD and provider.   Interventions: 1:1 collaboration with Laurey Morale, MD regarding development and update of comprehensive plan of care as evidenced by provider attestation and co-signature Inter-disciplinary care team collaboration (see longitudinal plan of care) Comprehensive medication review performed; medication list updated in electronic medical record  Hypertension (BP goal <140/90) -Uncontrolled -Current treatment: diltiazem (Cardizem CD) 360mg , 1 capsule daily - in PM - appropriate, query effective metoprolol succinate 100mg  once daily  - in AM - appropriate, query effective Spironolactone 25 mg 1  tablet daily - never started  -Medications previously tried: lisinopril (facial swelling) metoprolol succinate 100mg , HCTZ 25mg  (patient reports these were stopped when she started diltiazem) -Current home readings: 130/87, 113/88 HR 82 (once or twice a week) - arm cuff has brought it in -Current dietary habits: did not discuss -Current exercise habits: no structured exercise; limited in mobility -Reports hypotensive/hypertensive symptoms -Educated on BP goals and benefits of medications for prevention of heart attack, stroke and kidney damage; Daily salt intake goal < 2300 mg; Exercise goal of 150 minutes per week; Importance of home blood pressure monitoring; Proper BP monitoring technique; -Counseled to monitor BP at home a few times weekly, document, and provide log at future appointments -Counseled on diet and exercise extensively Recommended to continue current medication  Hyperlipidemia: (LDL goal < 100) -Controlled -Current treatment: atorvastatin 10mg , 1 tablet daily -  appropriate, effective, safe, accessible fenofibrate 160mg , 1 tablet daily - appropriate, query effective -Medications previously tried: none  -Current dietary patterns: patient is no longer eating red meat -Current exercise habits: limited in mobility -Educated on Cholesterol goals;  Benefits of statin for ASCVD risk reduction; Importance of limiting foods high in cholesterol; Exercise goal of 150 minutes per week; -Counseled on diet and exercise extensively Recommended to continue current medication Collaborated with PCP on management of triglycerides with additional medication adjustment  Diabetes (A1c goal <7%) -Uncontrolled -Current medications: semaglutide (Ozempic) 1 mg once a week - appropriate, query effective  metformin XR 500mg  1 tablet twice daily  - appropriate, query effective -Medications previously tried: glipizide  -Current home glucose readings: using a CGM fasting glucose: 87, 140  average (last 7 days), 143 (last 14 days) post prandial glucose: n/a -Denies hypoglycemic/hyperglycemic symptoms -Current meal patterns: sometimes eating once or twice a  day (needs to eat cheaper/easier foods) breakfast: cinnamon poptart  lunch: did not discuss dinner: did not discuss snacks: did not discuss drinks: half and half tea, water -Current exercise: limited in mobility -Educated on A1c and blood sugar goals; Exercise goal of 150 minutes per week; Benefits of routine self-monitoring of blood sugar; Carbohydrate counting and/or plate method -Counseled to check feet daily and get yearly eye exams -Counseled on diet and exercise extensively Recommended to continue current medication Recommended increasing Ozempic to 2 mg weekly and Freestyle libre for blood sugar monitoring.  Depression/Anxiety (Goal: minimize symptoms) -Controlled -Current treatment: venlafaxine XR 150mg , 1 capsule one daily with breakfast - appropriate, effective, safe, accessible -Medications previously tried/failed: sertraline -PHQ9: 14  -GAD7: n/a -Educated on Benefits of medication for symptom control -Recommended to continue current medication  Insomnia (Goal: improve quality and quantity of sleep) -Controlled -Current treatment  temazepam 30mg , 1 capsule at bedtime as needed for sleep - appropriate, effective, query safe Melatonin 10 mg, 1 capsule at bedtime - appropriate, effective, query safe -Medications previously tried: n/a  -Counseled on non-pharmacological interventions for insomnia. (Avoid napping, limit exposure to technology near bedtime, etc). ; discussed trying 5 mg dose instead of 10 mg and stressed the importance of timed release to help with staying asleep  Hypothyroidism (Goal: TSH 0.35-4.5) -Controlled -Current treatment  levothyroxine 44mcg, 1 tablet daily - appropriate, effective, safe, accessible -Medications previously tried: none  -Recommended to continue current  medication  Arthritis/muscle spasms (Goal: minimize pain) -Controlled -Current treatment  diclofenac 75mg , 1 tablet twice daily - tries to not take scheduled - appropriate, effective, safe, accessible tramadol 50mg , 2 tablets three times daily as needed for moderate pain - appropriate, effective, safe, accessible hydromorphone 4mg , 1 tablet every six hours as needed for severe pain  - appropriate, effective, safe, accessible cyclobenzaprine 10mg , 1 tablet three times daily as needed for muscle spasms - appropriate, effective, safe, accessible benadryl 25 mg - takes this only with the hydromorphone Voltaren gel (diclofenac) as needed -Medications previously tried: Opana (oxymorphone) -Recommended to continue current medication  Vitamin B12 deficiency (Goal: 211-911) -Not ideally controlled -Current treatment  Vitamin B12 1000 mcg 1 tablet daily -Medications previously tried: none  -Recommended decreasing to every other day and then buying 500 mcg to take daily to avoid oversupplementation  GERD (Goal: minimize symptoms) -Controlled -Current treatment  omeprazole 20mg , 1 capsule twice daily - appropriate, effective, query safe -Medications previously tried: none  -Counseled on non-pharmacological interventions for acid reflux. Take measures to prevent acid reflux, such as avoiding spicy foods, avoiding caffeine, avoid laying down a few hours after eating, and raising the head of the bed.  Neuropathy (Goal: minimize pain) -Controlled -Current treatment  gabapentin 300mg , 2 capsules three times daily - appropriate, effective, safe, accessible -Medications previously tried: none  -Recommended to continue current medication  Skin lesions (Goal: minimize symptoms) -Controlled -Current treatment  mupirocin 2% ointment, apply three times daily  Bactrim DS, 1 tablet twice daily triamcinolone 0.1% cream, apply twice daily -Medications previously tried: none  -Recommended to continue  current medication   Health Maintenance -Vaccine gaps: shingles, COVID booster -Current therapy:  Biotin 5000 mcg (collagen support) 1 tablet daily Aspirin 81 mg 1 tablet daily -Educated on Cost vs benefit of each product must be carefully weighed by individual consumer -Patient is satisfied with current therapy and denies issues -Recommended to continue current medication  Patient Goals/Self-Care Activities Patient will:  - take medications as prescribed check glucose daily, document, and provide  at future appointments check blood pressure a few times a week, document, and provide at future appointments engage in dietary modifications by adding more vegetables to her diet  Follow Up Plan: Telephone follow up appointment with care management team member scheduled for: 3 months       Patient verbalizes understanding of instructions provided today and agrees to view in [place].  Telephone follow up appointment with pharmacy team member scheduled for: 3 months  Viona Gilmore, [facility]

## 2021-01-29 NOTE — Progress Notes (Signed)
Chronic Care Management Pharmacy Note  01/29/2021 Name:  Christina [name] MRN:  [id number] DOB:  [birth year]   Summary: BP at goal < 140/90 per home readings A1c not at goal < 7% but home BGs have improved    Recommendations/Changes made from today's visit: -Recommend increasing Ozempic to 2 mg weekly once she completes current 1 mg pens as pt does not want to double up -Recommended follow up with GI for trouble swallowing   Plan: Follow up DM assessment in 1-2 months  Subjective: Christina [name] is an 56 y.o. year old female who is a primary patient of Laurey Morale, MD.  The CCM team was consulted for assistance with disease management and care coordination needs.    Engaged with patient by telephone for follow up visit in response to provider referral for pharmacy case management and/or care coordination services.   Consent to Services:  The patient was given information about Chronic Care Management services, agreed to services, and gave verbal consent prior to initiation of services.  Please see initial visit note for detailed documentation.   Patient Care Team: Laurey Morale, MD as PCP - General Christina [name], [facility] as Pharmacist (Pharmacist)  Recent office visits: 11/16/20 Christina Penna, MD: Patient presented for DM follow up. A1c decreased to 7.2% and BP elevated in office. Prescribed spironolactone 25 mg daily and increased Ozempic to 2 mg weekly.  06/14/20 Christina Pigg, LPN: Patient presented for AWV.  Recent consult visits: 08/29/20 Emelda Brothers (neurosurgery): Unable to access notes.  Dresser (neurosurgery): Unable to access notes.  07/11/20 [name], MD (Orthopedic Surgery): Patient presented for follow-up for left hip and right elbow pain. Prescribed Celecoxib 200 mg Oral 2 times daily PRN.  Hospital visits: None in previous 6 months  Objective:  Lab Results  Component Value Date   CREATININE 0.98 11/16/2020   BUN 19 11/16/2020    GFR 64.65 11/16/2020   GFRNONAA 94.60 09/11/2009   GFRAA  05/03/2009    >60        The eGFR has been calculated using the MDRD equation. This calculation has not been validated in all clinical situations. eGFR's persistently <60 mL/min signify possible Chronic Kidney Disease.   NA 139 11/16/2020   K 4.4 11/16/2020   CALCIUM 9.3 11/16/2020   CO2 28 11/16/2020   GLUCOSE 145 (H) 11/16/2020    Lab Results  Component Value Date/Time   HGBA1C 7.2 (A) 11/16/2020 01:24 PM   HGBA1C 8.1 (H) 07/05/2020 01:21 PM   HGBA1C 7.9 (A) 02/10/2020 05:24 PM   HGBA1C 9.3 (H) 05/02/2019 02:51 PM   GFR 64.65 11/16/2020 02:32 PM   GFR 46.25 (L) 05/02/2019 02:51 PM   MICROALBUR 2.5 (H) 02/21/2014 02:22 PM   MICROALBUR 1.7 10/05/2013 10:42 AM    Last diabetic Eye exam: No results found for: HMDIABEYEEXA  Last diabetic Foot exam: No results found for: HMDIABFOOTEX   Lab Results  Component Value Date   CHOL 198 02/10/2020   HDL 52 02/10/2020   LDLCALC 97 02/10/2020   LDLDIRECT 107.0 05/02/2019   TRIG 366 (H) 02/10/2020   CHOLHDL 3.8 02/10/2020    Hepatic Function Latest Ref Rng & Units 02/10/2020 05/02/2019 03/17/2018  Total Protein 6.1 - 8.1 g/dL 7.1 7.1 7.0  Albumin 3.5 - 5.2 g/dL - 4.6 4.4  AST 10 - 35 U/L _0 ALT 6 - 29 U/L _1 Alk Phosphatase 39 - 117 U/L - 48  50  Total Bilirubin 0.2 - 1.2 mg/dL 0.4 0.4 0.3  Bilirubin, Direct 0.0 - 0.2 mg/dL 0.1 0.1 0.1    Lab Results  Component Value Date/Time   TSH 2.08 11/16/2020 02:32 PM   TSH 2.40 02/10/2020 03:35 PM   FREET4 1.00 11/16/2020 02:32 PM   FREET4 1.2 02/10/2020 03:35 PM    CBC Latest Ref Rng & Units 11/16/2020 02/10/2020 05/02/2019  WBC 4.0 - 10.5 K/uL 7.0 7.0 5.5  Hemoglobin 12.0 - 15.0 g/dL 13.0 12.1 12.3  Hematocrit 36.0 - 46.0 % 38.8 36.5 37.8  Platelets 150.0 - 400.0 K/uL 186.0 184 145.0(L)    No results found for: VD25OH  Clinical ASCVD: No  The 10-year ASCVD risk score (Arnett DK, et al., 2019) is: 8%    Values used to calculate the score:     Age: 45 years     Sex: Female     Is Non-Hispanic African American: No     Diabetic: Yes     Tobacco smoker: No     Systolic Blood Pressure: 295 mmHg     Is BP treated: Yes     HDL Cholesterol: 52 mg/dL     Total Cholesterol: 198 mg/dL    Depression screen [facility] 2/9 11/16/2020 06/14/2020 06/14/2020  Decreased Interest 3 0 0  Down, Depressed, Hopeless 2 0 0  PHQ - 2 Score 5 0 0  Altered sleeping 3 - -  Tired, decreased energy 2 - -  Change in appetite 3 - -  Feeling bad or failure about yourself  1 - -  Trouble concentrating 1 - -  Moving slowly or fidgety/restless 0 - -  Suicidal thoughts 0 - -  PHQ-9 Score 15 - -  Difficult doing work/chores Somewhat difficult - -  Some recent data might be hidden      Social History   Tobacco Use  Smoking Status Former  Smokeless Tobacco Never   BP Readings from Last 3 Encounters:  11/16/20 (!) 150/78  02/10/20 (!) 150/90  11/07/19 128/78   Pulse Readings from Last 3 Encounters:  11/16/20 87  02/10/20 81  11/07/19 92   Wt Readings from Last 3 Encounters:  11/16/20 210 lb 4 oz (95.4 kg)  02/10/20 212 lb 6.4 oz (96.3 kg)  11/07/19 210 lb 6.4 oz (95.4 kg)   BMI Readings from Last 3 Encounters:  11/16/20 37.84 kg/m  02/10/20 38.23 kg/m  11/07/19 38.48 kg/m    Assessment/Interventions: Review of patient past medical history, allergies, medications, health status, including review of consultants reports, laboratory and other test data, was performed as part of comprehensive evaluation and provision of chronic care management services.   SDOH:  (Social Determinants of Health) assessments and interventions performed: No  SDOH Screenings   Alcohol Screen: Not on file  Depression (PHQ2-9): Medium Risk   PHQ-2 Score: 15  Financial Resource Strain: Not on file  Food Insecurity: No Food Insecurity   Worried About Charity fundraiser in the Last Year: Never true   Ran Out of Food in the  Last Year: Never true  Housing: Low Risk    Last Housing Risk Score: 0  Physical Activity: Inactive   Days of Exercise per Week: 0 days   Minutes of Exercise per Session: 0 min  Social Connections: Moderately Isolated   Frequency of Communication with Friends and Family: Three times a week   Frequency of Social Gatherings with Friends and Family: Three times a week   Attends Religious Services:  Never   Active Member of Clubs or Organizations: No   Attends Archivist Meetings: Never   Marital Status: Married  Stress: No Stress Concern Present   Feeling of Stress : Not at all  Tobacco Use: Medium Risk   Smoking Tobacco Use: Former   Smokeless Tobacco Use: Never   Passive Exposure: Not on file  Transportation Needs: No Transportation Needs   Lack of Transportation (Medical): No   Lack of Transportation (Non-Medical): No    CCM Care Plan  Allergies  Allergen Reactions   Albuterol    Antihistamines, Loratadine-Type    Bee Venom    Clindamycin/Lincomycin    Codeine    Doxycycline    Lisinopril     REACTION: facial swelling   Metronidazole     REACTION: rash   Ondansetron Hcl     REACTION: rash   Oxycodone-Acetaminophen    Tylox [Oxycodone-Acetaminophen]    Vancomycin     REACTION: rash   Benadryl [Diphenhydramine] Nausea And Vomiting   Lidocaine Rash    Medications Reviewed Today     Reviewed by Laurey Morale, MD (Physician) on 11/16/20 at 1320  Med List Status: <None>   Medication Order Taking? Sig Documenting Provider Last Dose Status Informant  aspirin 81 MG EC tablet [id number] Yes Take 81 mg by mouth daily. [provider] Taking Active   atorvastatin (LIPITOR) 10 MG tablet [id number] Yes TAKE ONE TABLET BY MOUTH ONCE DAILY Laurey Morale, MD Taking Active   celecoxib (CELEBREX) 200 MG capsule [id number] Yes Take 1 capsule (200 mg total) by mouth 2 (two) times daily as needed. [name], MD Taking Active   cephALEXin [facility]) 500 MG  capsule [id number] Yes TAKE 1 CAPSULE BY MOUTH THREE TIMES A DAY Laurey Morale, MD Taking Active   cholecalciferol (VITAMIN D) 1000 UNITS tablet [id number] Yes Take 1,000 Units by mouth daily. [provider] Taking Active   Cinnamon 500 MG TABS [id number] Yes Take 2 tablets by mouth daily. [provider] Taking Active   clobetasol (TEMOVATE) 0.05 % external solution [id number] Yes Apply 1 application topically 2 (two) times daily. Laurey Morale, MD Taking Active   Continuous Blood Gluc Receiver (FREESTYLE LIBRE 14 DAY READER) [place] [id number] Yes 1 application by Does not apply route every 14 (fourteen) days. Laurey Morale, MD Taking Active   Continuous Blood Gluc Sensor (FREESTYLE LIBRE [place]) [place] [id number] Yes 1 application by Does not apply route every 14 (fourteen) days. Laurey Morale, MD Taking Active   [facility] ULTRA THIN LANCETS [place] [id number] Yes 1 strip by Does not apply route as directed. Laurey Morale, MD Taking Active   Cyanocobalamin (VITAMIN B 12 PO) [id number] Yes Take 1,000 Units by mouth daily. [provider] Taking Active   cyclobenzaprine (FLEXERIL) 10 MG tablet [id number] Yes TAKE 1 TABLET BY MOUTH THREE TIMES A DAY AS NEEDED FOR MUSCLE SPASMS Laurey Morale, MD Taking Active   desonide (DESOWEN) 0.05 % cream [id number] Yes Apply topically 2 (two) times daily. [provider] Taking Active   diclofenac (VOLTAREN) 75 MG EC tablet [id number] Yes TAKE ONE TABLET BY MOUTH TWICE DAILY Laurey Morale, MD Taking Active   diltiazem (CARDIZEM CD) 360 MG 24 hr capsule [id number] Yes Take 1 capsule (360 mg total) by mouth daily. Laurey Morale, MD Taking Active   diphenhydrAMINE (BENADRYL) 25 mg capsule [id number] Yes Take 25 mg by mouth as needed. [provider] Taking Active  EPINEPHrine 0.3 mg/0.3 mL IJ SOAJ injection [id number] Yes Inject 0.3 mg into the muscle as needed for anaphylaxis. Laurey Morale, MD Taking Active   fenofibrate 160 MG tablet  [id number] Yes Take 1 tablet (160 mg total) by mouth daily. *Appointment required for future refills* Laurey Morale, MD Taking Active   gabapentin (NEURONTIN) 300 MG capsule [id number] Yes TAKE 2 CAPSULES (600 MG TOTAL) BY MOUTH 3 (THREE) TIMES DAILY. Laurey Morale, MD Taking Active   glucose blood (TRUETEST TEST) test strip [id number] Yes Use as instructed Laurey Morale, MD Taking Active   HYDROmorphone (DILAUDID) 4 MG tablet [id number] Yes Take 1 tablet (4 mg total) by mouth every 6 (six) hours as needed for severe pain. Laurey Morale, MD Taking Active   levothyroxine (SYNTHROID) 50 MCG tablet [id number] Yes TAKE ONE TABLET BY MOUTH ONCE DAILY Laurey Morale, MD Taking Active   metFORMIN (GLUCOPHAGE-XR) 500 MG 24 hr tablet [id number] Yes TAKE 1 TABLET BY MOUTH TWICE A DAY WITH MEAL Laurey Morale, MD Taking Active   metoprolol succinate (TOPROL-XL) 100 MG 24 hr tablet [id number] Yes TAKE ONE TABLET BY MOUTH ONCE DAILY WITH FOOD Laurey Morale, MD Taking Active   mupirocin ointment (BACTROBAN) 2 % [id number] Yes Apply topically 3 (three) times daily. Laurey Morale, MD Taking Active   omeprazole (PRILOSEC) 20 MG capsule [id number] Yes Take 1 capsule (20 mg total) by mouth 2 (two) times daily. Laurey Morale, MD Taking Active     Discontinued 09/22/11 [id number] (Reorder)   potassium chloride (KLOR-CON) 10 MEQ tablet [id number] Yes TAKE ONE TABLET BY MOUTH ONCE DAILY Laurey Morale, MD Taking Active   promethazine (PHENERGAN) 25 MG tablet [id number] Yes Take 1 tablet (25 mg total) by mouth every 6 (six) hours as needed for nausea. Laurey Morale, MD Taking Active   Semaglutide,0.25 or 0.5MG /DOS, (OZEMPIC, 0.25 OR 0.5 MG/DOSE,) 2 MG/1.5ML SOPN [id number] Yes Inject 0.5 mg into the skin once a week. Laurey Morale, MD Taking Active   sulfamethoxazole-trimethoprim (BACTRIM DS) 800-160 MG tablet [id number] Yes Take 1 tablet by mouth 2 (two) times daily. Laurey Morale, MD Taking Active   temazepam (RESTORIL) 30 MG  capsule [id number] Yes Take ONE capsule by MOUTH AT bedtime as needed FOR SLEEP Laurey Morale, MD Taking Active   traMADol (ULTRAM) 50 MG tablet [id number] Yes TAKE TWO TABLETS BY MOUTH THREE TIMES DAILY AS NEEDED FOR moderate pain Laurey Morale, MD Taking Active   triamcinolone cream (KENALOG) 0.1 % [id number] Yes Apply 1 application topically 2 (two) times daily. Laurey Morale, MD Taking Active   venlafaxine XR (EFFEXOR-XR) 150 MG 24 hr capsule [id number] Yes Take 1 capsule (150 mg total) by mouth every morning. *appointment required for future refills* Laurey Morale, MD Taking Active   vitamin B-12 (CYANOCOBALAMIN) 1000 MCG tablet [id number] Yes Take 1,000 mcg by mouth daily. 2500 mcg once daily [provider] Taking Active Self  vitamin E 180 MG (400 UNITS) capsule [id number] Yes Take 400 Units by mouth daily. [provider] Taking Active Self            Patient Active Problem List   Diagnosis Date Noted   Urinary incontinence 11/16/2020   Hyperlipidemia    Depression with anxiety 07/13/2019   Psoriasis 10/29/2016   Morbid obesity ([place]) 01/04/2016   Hypothyroidism 04/05/2014   Type 2 diabetes mellitus, uncontrolled 03/06/2014   NECK PAIN 11/28/2009   BRACHIAL NEURITIS OR RADICULITIS  NOS 11/28/2009   B12 deficiency 09/14/2009   HYPERTRIGLYCERIDEMIA 09/14/2009   Diabetic neuropathy ([place]) 09/11/2009   ECZEMA 06/07/2009   Anxiety state 07/26/2008   [place] DISEASE, LUMBAR 06/22/2007   ARTHRITIS 06/11/2007   LOW BACK PAIN 12/28/2006   Essential hypertension 09/11/2006   Asthma 09/11/2006   GERD 09/11/2006   History of colonic polyps 09/11/2006    Immunization History  Administered Date(s) Administered   Influenza Whole 12/27/2004, 11/27/2008   Influenza,inj,Quad PF,6+ Mos 10/08/2012, 10/05/2013, 10/06/2014, 01/04/2016, 10/29/2016, 12/23/2017, 11/30/2018, 11/07/2019, 11/16/2020   PFIZER(Purple Top)SARS-COV-2 Vaccination 05/20/2019, 06/15/2019   Pneumococcal  Polysaccharide-23 01/16/2020   Tdap 04/01/2017    Conditions to be addressed/monitored:  Hypertension, Hyperlipidemia, Diabetes, GERD, Hypothyroidism, Anxiety, Osteoarthritis and insomnia, vitamin B12 deficiency  Conditions addressed this visit: Diabetes, hypertension  Care Plan : [place]  Updates made by Christina [name], [place] since 01/29/2021 12:00 AM     Problem: Problem: Hypertension, Hyperlipidemia, Diabetes, GERD, Hypothyroidism, Anxiety, Osteoarthritis and insomnia, vitamin B12 deficiency      Long-Range Goal: Patient-Specific Goal   Start Date: 04/27/2020  Expected End Date: 04/27/2021  Recent Progress: Not on track  Priority: High  Note:   Current Barriers:  Unable to independently monitor therapeutic efficacy Unable to achieve control of diabetes  Unable to maintain control of blood pressure Suboptimal therapeutic regimen for cholesterol   Pharmacist Clinical Goal(s):  Patient will achieve adherence to monitoring guidelines and medication adherence to achieve therapeutic efficacy achieve control of diabetes as evidenced by A1c  through collaboration with PharmD and provider.   Interventions: 1:1 collaboration with Laurey Morale, MD regarding development and update of comprehensive plan of care as evidenced by provider attestation and co-signature Inter-disciplinary care team collaboration (see longitudinal plan of care) Comprehensive medication review performed; medication list updated in electronic medical record  Hypertension (BP goal <140/90) -Uncontrolled -Current treatment: diltiazem (Cardizem CD) 360m, 1 capsule daily - in PM - appropriate, query effective metoprolol succinate 1040monce daily  - in AM - appropriate, query effective Spironolactone 25 mg 1 tablet daily - never started  -Medications previously tried: lisinopril (facial swelling) metoprolol succinate 10093mHCTZ 71m55matient reports these were stopped when she started  diltiazem) -Current home readings: 130/87, 113/88 HR 82 (once or twice a week) - arm cuff has brought it in -Current dietary habits: did not discuss -Current exercise habits: no structured exercise; limited in mobility -Reports hypotensive/hypertensive symptoms -Educated on BP goals and benefits of medications for prevention of heart attack, stroke and kidney damage; Daily salt intake goal < 2300 mg; Exercise goal of 150 minutes per week; Importance of home blood pressure monitoring; Proper BP monitoring technique; -Counseled to monitor BP at home a few times weekly, document, and provide log at future appointments -Counseled on diet and exercise extensively Recommended to continue current medication  Hyperlipidemia: (LDL goal < 100) -Controlled -Current treatment: atorvastatin 10mg52mtablet daily -  appropriate, effective, safe, accessible fenofibrate 160mg,62mablet daily - appropriate, query effective -Medications previously tried: none  -Current dietary patterns: patient is no longer eating red meat -Current exercise habits: limited in mobility -Educated on Cholesterol goals;  Benefits of statin for ASCVD risk reduction; Importance of limiting foods high in cholesterol; Exercise goal of 150 minutes per week; -Counseled on diet and exercise extensively Recommended to continue current medication Collaborated with PCP on management of triglycerides with additional medication adjustment  Diabetes (A1c goal <7%) -Uncontrolled -Current medications: semaglutide (Ozempic) 1 mg once a week -  appropriate, query effective  metformin XR 552m 1 tablet twice daily  - appropriate, query effective -Medications previously tried: glipizide  -Current home glucose readings: using a CGM fasting glucose: 87, 140 average (last 7 days), 143 (last 14 days) post prandial glucose: n/a -Denies hypoglycemic/hyperglycemic symptoms -Current meal patterns: sometimes eating once or twice a day (needs to  eat cheaper/easier foods) breakfast: cinnamon poptart  lunch: did not discuss dinner: did not discuss snacks: did not discuss drinks: half and half tea, water -Current exercise: limited in mobility -Educated on A1c and blood sugar goals; Exercise goal of 150 minutes per week; Benefits of routine self-monitoring of blood sugar; Carbohydrate counting and/or plate method -Counseled to check feet daily and get yearly eye exams -Counseled on diet and exercise extensively Recommended to continue current medication Recommended increasing Ozempic to 2 mg weekly and Freestyle libre for blood sugar monitoring.  Depression/Anxiety (Goal: minimize symptoms) -Controlled -Current treatment: venlafaxine XR 1544m 1 capsule one daily with breakfast - appropriate, effective, safe, accessible -Medications previously tried/failed: sertraline -PHQ9: 14  -GAD7: n/a -Educated on Benefits of medication for symptom control -Recommended to continue current medication  Insomnia (Goal: improve quality and quantity of sleep) -Controlled -Current treatment  temazepam 3074m1 capsule at bedtime as needed for sleep - appropriate, effective, query safe Melatonin 10 mg, 1 capsule at bedtime - appropriate, effective, query safe -Medications previously tried: n/a  -Counseled on non-pharmacological interventions for insomnia. (Avoid napping, limit exposure to technology near bedtime, etc). ; discussed trying 5 mg dose instead of 10 mg and stressed the importance of timed release to help with staying asleep  Hypothyroidism (Goal: TSH 0.35-4.5) -Controlled -Current treatment  levothyroxine 60m34m1 tablet daily - appropriate, effective, safe, accessible -Medications previously tried: none  -Recommended to continue current medication  Arthritis/muscle spasms (Goal: minimize pain) -Controlled -Current treatment  diclofenac 75mg51mtablet twice daily - tries to not take scheduled - appropriate, effective, safe,  accessible tramadol 60mg,75mablets three times daily as needed for moderate pain - appropriate, effective, safe, accessible hydromorphone 4mg, 133mblet every six hours as needed for severe pain  - appropriate, effective, safe, accessible cyclobenzaprine 10mg, 142mlet three times daily as needed for muscle spasms - appropriate, effective, safe, accessible benadryl 25 mg - takes this only with the hydromorphone Voltaren gel (diclofenac) as needed -Medications previously tried: Opana (oxymorphone) -Recommended to continue current medication  Vitamin B12 deficiency (Goal: 211-911) -Not ideally controlled -Current treatment  Vitamin B12 1000 mcg 1 tablet daily -Medications previously tried: none  -Recommended decreasing to every other day and then buying 500 mcg to take daily to avoid oversupplementation  GERD (Goal: minimize symptoms) -Controlled -Current treatment  omeprazole 20mg, 1 74mule twice daily - appropriate, effective, query safe -Medications previously tried: none  -Counseled on non-pharmacological interventions for acid reflux. Take measures to prevent acid reflux, such as avoiding spicy foods, avoiding caffeine, avoid laying down a few hours after eating, and raising the head of the bed.  Neuropathy (Goal: minimize pain) -Controlled -Current treatment  gabapentin 300mg, 2 c94mles three times daily - appropriate, effective, safe, accessible -Medications previously tried: none  -Recommended to continue current medication  Skin lesions (Goal: minimize symptoms) -Controlled -Current treatment  mupirocin 2% ointment, apply three times daily  Bactrim DS, 1 tablet twice daily triamcinolone 0.1% cream, apply twice daily -Medications previously tried: none  -Recommended to continue current medication   Health Maintenance -Vaccine gaps: shingles, COVID booster -Current therapy:  Biotin 5000 mcg (collagen support)  1 tablet daily Aspirin 81 mg 1 tablet daily -Educated on  Cost vs benefit of each product must be carefully weighed by individual consumer -Patient is satisfied with current therapy and denies issues -Recommended to continue current medication  Patient Goals/Self-Care Activities Patient will:  - take medications as prescribed check glucose daily, document, and provide at future appointments check blood pressure a few times a week, document, and provide at future appointments engage in dietary modifications by adding more vegetables to her diet  Follow Up Plan: Telephone follow up appointment with care management team member scheduled for: 3 months      Medication Assistance:  Ozempic obtained through [employer] medication assistance program.  Enrollment ends 01/26/21  Compliance/Adherence/Medication fill history: Care Gaps: Foot exam, eye exam, PAP smear, urine microalbumin, shingrix, eye exam, COVID booster, Prevnar20 Last BP - 150/78 on 11/16/2020 Last A1C - 7.2 on 11/16/2020    Star-Rating Drugs: Atorvastatin 10 mg - last filled 12/06/2020 90 DS at [facility] Metformin 510m - last filled on 11/19/2020 86DS at [facility] Semaglutide 257m- none listed in chart  Patient's preferred pharmacy is:  [facility] [place] [address] [place]: [phone]: [phone] Uses pill box? Yes - in AM/PM - night pills in old pill bottles (10 bottles), keeping other pills (out of sight) Pt endorses 100% compliance  We discussed: Benefits of medication synchronization, packaging and delivery as well as enhanced pharmacist oversight with [facility]. Patient decided to: Utilize [facility] pharmacy for medication synchronization, packaging and delivery  Care Plan and Follow Up Patient Decision:  Patient agrees to Care Plan and Follow-up.  Plan: Telephone follow up appointment with care management team member scheduled for:  3 months  MaJeni Salles PharmD, BCDeer [phone] medications: -Fenofibrate -Venlafaxine -Cyclobenzaprine -Potassium chloride -Celecoxib -Bactrium -Metoprolol -sensors

## 2021-02-07 ENCOUNTER — Other Ambulatory Visit: Payer: Self-pay

## 2021-02-07 NOTE — Telephone Encounter (Signed)
Attempted  to complete prior authorization patient.    Insurance card information wasn't up to date with 2023 card, prior authorization reply was "no coverage."     Called patient to obtain updated insurance information. Patient stated that she has received Cyclobenzaprine from pharmacy within the last few days.       Patient will scan new   [facility] Advantage insurance  [phone] [place] [phone]

## 2021-02-26 DIAGNOSIS — E1165 Type 2 diabetes mellitus with hyperglycemia: Secondary | ICD-10-CM | POA: Diagnosis not present

## 2021-02-26 DIAGNOSIS — I10 Essential (primary) hypertension: Secondary | ICD-10-CM

## 2021-02-28 ENCOUNTER — Other Ambulatory Visit: Payer: Self-pay | Admitting: Family Medicine

## 2021-02-28 ENCOUNTER — Telehealth: Payer: Self-pay | Admitting: Pharmacist

## 2021-02-28 NOTE — Chronic Care Management (AMB) (Signed)
Chronic Care Management Pharmacy Assistant   Name: Christina [name]  MRN: [id number] DOB: Jan 11, 1966  Reason for Encounter: Medication Review / Medication Coordination Call   Conditions to be addressed/monitored: HTN  Recent office visits:  None  Recent consult visits:  None  Hospital visits:  None  Medications: Outpatient Encounter Medications as of 02/28/2021  Medication Sig   aspirin 81 MG EC tablet Take 81 mg by mouth daily.   atorvastatin (LIPITOR) 10 MG tablet TAKE ONE TABLET BY MOUTH ONCE DAILY   celecoxib (CELEBREX) 200 MG capsule Take 1 capsule (200 mg total) by mouth 2 (two) times daily as needed.   cephALEXin (KEFLEX) 500 MG capsule TAKE 1 CAPSULE BY MOUTH THREE TIMES A DAY   cholecalciferol (VITAMIN D) 1000 UNITS tablet Take 1,000 Units by mouth daily.   Cinnamon 500 MG TABS Take 2 tablets by mouth daily.   clobetasol (TEMOVATE) 0.05 % external solution Apply 1 application topically 2 (two) times daily.   Continuous Blood Gluc Receiver (FREESTYLE LIBRE 14 DAY READER) DEVI 1 application by Does not apply route every 14 (fourteen) days.   Continuous Blood Gluc Sensor (FREESTYLE LIBRE 14 DAY SENSOR) MISC 1 application by Does not apply route every 14 (fourteen) days.   [employer] ULTRA THIN LANCETS MISC 1 strip by Does not apply route as directed.   cyclobenzaprine (FLEXERIL) 10 MG tablet TAKE 1 TABLET BY MOUTH THREE TIMES A DAY AS NEEDED FOR MUSCLE SPASMS   desonide (DESOWEN) 0.05 % cream Apply topically 2 (two) times daily.   diclofenac (VOLTAREN) 75 MG EC tablet TAKE ONE TABLET BY MOUTH TWICE DAILY   diltiazem (CARDIZEM CD) 360 MG 24 hr capsule Take 1 capsule (360 mg total) by mouth daily.   diphenhydrAMINE (BENADRYL) 25 mg capsule Take 25 mg by mouth as needed.   EPINEPHrine 0.3 mg/0.3 mL IJ SOAJ injection Inject 0.3 mg into the muscle as needed for anaphylaxis.   fenofibrate 160 MG tablet Take 1 tablet (160 mg total) by mouth daily.   gabapentin (NEURONTIN) 300 MG capsule  TAKE 2 CAPSULES (600 MG TOTAL) BY MOUTH 3 (THREE) TIMES DAILY.   glucose blood (TRUETEST TEST) test strip Use as instructed   HYDROmorphone (DILAUDID) 4 MG tablet Take 1 tablet (4 mg total) by mouth every 6 (six) hours as needed for severe pain.   levothyroxine (SYNTHROID) 50 MCG tablet TAKE ONE TABLET BY MOUTH EVERY MORNING   metFORMIN (GLUCOPHAGE-XR) 500 MG 24 hr tablet TAKE 1 TABLET BY MOUTH TWICE A DAY WITH MEAL   metoprolol succinate (TOPROL-XL) 100 MG 24 hr tablet TAKE ONE TABLET BY MOUTH ONCE DAILY WITH FOOD   mupirocin ointment (BACTROBAN) 2 % Apply topically 3 (three) times daily.   omeprazole (PRILOSEC) 20 MG capsule Take ONE capsule by MOUTH twice daily   potassium chloride (KLOR-CON) 10 MEQ tablet TAKE ONE TABLET BY MOUTH ONCE DAILY   promethazine (PHENERGAN) 25 MG tablet Take 1 tablet (25 mg total) by mouth every 6 (six) hours as needed for nausea.   Semaglutide, 2 MG/DOSE, 8 MG/3ML SOPN Inject 2 mg as directed once a week.   spironolactone (ALDACTONE) 25 MG tablet Take 1 tablet (25 mg total) by mouth daily.   sulfamethoxazole-trimethoprim (BACTRIM DS) 800-160 MG tablet Take 1 tablet by mouth 2 (two) times daily.   temazepam (RESTORIL) 30 MG capsule Take ONE capsule by MOUTH AT bedtime as needed FOR SLEEP   traMADol (ULTRAM) 50 MG tablet TAKE TWO TABLETS BY MOUTH THREE TIMES  DAILY AS NEEDED FOR moderate pain   triamcinolone cream (KENALOG) 0.1 % Apply 1 application topically 2 (two) times daily.   venlafaxine XR (EFFEXOR-XR) 150 MG 24 hr capsule Take 1 capsule (150 mg total) by mouth every morning.   vitamin B-12 (CYANOCOBALAMIN) 1000 MCG tablet Take 1,000 mcg by mouth daily. 2500 mcg once daily   vitamin E 180 MG (400 UNITS) capsule Take 400 Units by mouth daily.   [DISCONTINUED] potassium chloride (KLOR-CON 10) 10 MEQ CR tablet Take 1 tablet (10 mEq total) by mouth daily.   No facility-administered encounter medications on file as of 02/28/2021.  Reviewed chart for medication  changes ahead of medication coordination call.  No OVs, Consults, or hospital visits since last care coordination call/Pharmacist visit. (If appropriate, list visit date, provider name)  No medication changes indicated OR if recent visit, treatment plan here.  BP Readings from Last 3 Encounters:  11/16/20 (!) 150/78  02/10/20 (!) 150/90  11/07/19 128/78    Lab Results  Component Value Date   HGBA1C 7.2 (A) 11/16/2020     Patient obtains medications through  Vials without safety caps   90 Days    Last adherence delivery included: Freestyle Libre 3 use as directed Levothyroxine 50 mcg 1 tablet every morning Atorvastatin 10 mg 1 tablet daily Diltiazem 360 mg 1 tablet daily Gabapentin 300 mg 2 capsules 3 times daily Diclofenac 75 mg 1 tablet 2 times daily Omeprazole 20 mg 1 capsule daily Cyclobenzaprine (FLEXERIL) 10 mg: take one tablet by mouth three times a day as needed for muscle spasms Tramadol (Ultram) 50 mg: take two tablets by mouth 3 times daily as needed.    Patient declined meds last month: No medications declined last delivery.     Patient is due for next adherence delivery on: 03/13/2021   Called patient and reviewed medications and coordinated delivery.   This delivery to include: Levothyroxine 50 mcg 1 tablet every morning Atorvastatin 10 mg 1 tablet daily Diltiazem 360 mg 1 tablet daily Gabapentin 300 mg 2 capsules 3 times daily Diclofenac 75 mg 1 tablet 2 times daily Omeprazole 20 mg 1 capsule twice daily Cyclobenzaprine 10 mg 1 tablet three times daily as needed Celebrex 200 mg 1 tablet twice daily as needed Spironolactone 25 mg  1 tablet daily  Patient declined meds for:  Freestyle Libre 3 states she has plenty in stock   Patient will need a short fill: Patient denies a short fill    Coordinated acute fill : Patient denies an acute fill   Confirmed delivery date of 03/13/2021, advised patient that pharmacy will contact them the morning of  delivery.  Note: Patient states her Ozempic (filled by patient assistance) is mailed to Dr. Barbie [name] office and Christina [name] takes it over to [facility] to be delivered with her order.  Care Gaps: AWV - completed 06/14/20 Last BP - 150/78 on 11/16/2020 Last A1C - 7.2 on 11/16/2020 Foot exam - never done. Ophthalmology exam - never done Hepatitis C screening - never done Pap smear  - never done Zoster vaccines - never done Urine microalbumin - overdue   Covid  booster - overdue  Star Rating Drugs: Atorvastatin 10 mg - last filled 12/06/2020 90 DS at [facility] Metformin 500mg  - last filled on 11/19/2020 86DS at [facility] Semaglutide 2mg  - none listed in chart  [facility] Pharmacist Assistant [phone]

## 2021-02-28 NOTE — Telephone Encounter (Signed)
Pt LOV was on 11/16/2020 Last refill was done on 09/19/2020 Please advise

## 2021-03-06 NOTE — Chronic Care Management (AMB) (Signed)
Left message for patient letting her know [facility] is running about 6 weeks behind fulfilling orders and her order has not yet arrived at Dr. Barbie Banner office.  She was given the number to [facility] [phone]) if she would like to check the current status of her order.

## 2021-04-01 ENCOUNTER — Other Ambulatory Visit: Payer: Self-pay | Admitting: Orthopaedic Surgery

## 2021-04-02 ENCOUNTER — Telehealth: Payer: Self-pay | Admitting: Pharmacist

## 2021-04-02 NOTE — Chronic Care Management (AMB) (Addendum)
? ? ?Chronic Care Management ?Pharmacy Assistant  ? ?Name: Christina [name]  MRN: [id number] DOB: July 21, 1965 ? ?Reason for Encounter: Medication Review / Medication Coordination Call ?  ?Conditions to be addressed/monitored: ?HTN ? ? ?Recent office visits:  ?None ? ?Recent consult visits:  ?None ? ?Hospital visits:  ?None ? ?Medications: ?Outpatient Encounter Medications as of 04/02/2021  ?Medication Sig  ? aspirin 81 MG EC tablet Take 81 mg by mouth daily.  ? atorvastatin (LIPITOR) 10 MG tablet TAKE ONE TABLET BY MOUTH ONCE DAILY  ? celecoxib (CELEBREX) 200 MG capsule TAKE ONE CAPSULE BY MOUTH twice A DAY AS NEEDED  ? cephALEXin (KEFLEX) 500 MG capsule TAKE 1 CAPSULE BY MOUTH THREE TIMES A DAY  ? cholecalciferol (VITAMIN D) 1000 UNITS tablet Take 1,000 Units by mouth daily.  ? Cinnamon 500 MG TABS Take 2 tablets by mouth daily.  ? clobetasol (TEMOVATE) 0.05 % external solution Apply 1 application topically 2 (two) times daily.  ? Continuous Blood Gluc Receiver (FREESTYLE LIBRE 14 DAY READER) DEVI 1 application by Does not apply route every 14 (fourteen) days.  ? Continuous Blood Gluc Sensor (FREESTYLE LIBRE 14 DAY SENSOR) MISC 1 application by Does not apply route every 14 (fourteen) days.  ? [facility] ULTRA THIN LANCETS MISC 1 strip by Does not apply route as directed.  ? cyclobenzaprine (FLEXERIL) 10 MG tablet TAKE 1 TABLET BY MOUTH THREE TIMES A DAY AS NEEDED FOR MUSCLE SPASMS  ? desonide (DESOWEN) 0.05 % cream Apply topically 2 (two) times daily.  ? diclofenac (VOLTAREN) 75 MG EC tablet TAKE ONE TABLET BY MOUTH TWICE DAILY  ? diltiazem (CARDIZEM CD) 360 MG 24 hr capsule Take 1 capsule (360 mg total) by mouth daily.  ? diphenhydrAMINE (BENADRYL) 25 mg capsule Take 25 mg by mouth as needed.  ? EPINEPHrine 0.3 mg/0.3 mL IJ SOAJ injection Inject 0.3 mg into the muscle as needed for anaphylaxis.  ? fenofibrate 160 MG tablet Take 1 tablet (160 mg total) by mouth daily.  ? gabapentin (NEURONTIN) 300 MG capsule TAKE TWO CAPSULES BY  MOUTH THREE TIMES DAILY  ? glucose blood (TRUETEST TEST) test strip Use as instructed  ? HYDROmorphone (DILAUDID) 4 MG tablet Take 1 tablet (4 mg total) by mouth every 6 (six) hours as needed for severe pain.  ? levothyroxine (SYNTHROID) 50 MCG tablet TAKE ONE TABLET BY MOUTH EVERY MORNING  ? metFORMIN (GLUCOPHAGE-XR) 500 MG 24 hr tablet TAKE 1 TABLET BY MOUTH TWICE A DAY WITH MEAL  ? metoprolol succinate (TOPROL-XL) 100 MG 24 hr tablet TAKE ONE TABLET BY MOUTH ONCE DAILY WITH FOOD  ? mupirocin ointment (BACTROBAN) 2 % Apply topically 3 (three) times daily.  ? omeprazole (PRILOSEC) 20 MG capsule TAKE ONE CAPSULE BY MOUTH TWICE DAILY  ? potassium chloride (KLOR-CON) 10 MEQ tablet TAKE ONE TABLET BY MOUTH ONCE DAILY  ? promethazine (PHENERGAN) 25 MG tablet Take 1 tablet (25 mg total) by mouth every 6 (six) hours as needed for nausea.  ? Semaglutide, 2 MG/DOSE, 8 MG/3ML SOPN Inject 2 mg as directed once a week.  ? spironolactone (ALDACTONE) 25 MG tablet Take 1 tablet (25 mg total) by mouth daily.  ? sulfamethoxazole-trimethoprim (BACTRIM DS) 800-160 MG tablet Take 1 tablet by mouth 2 (two) times daily.  ? temazepam (RESTORIL) 30 MG capsule Take ONE capsule by MOUTH AT bedtime as needed FOR SLEEP  ? traMADol (ULTRAM) 50 MG tablet TAKE TWO TABLETS BY MOUTH THREE TIMES DAILY AS NEEDED FOR moderate pain  ?  triamcinolone cream (KENALOG) 0.1 % Apply 1 application topically 2 (two) times daily.  ? venlafaxine XR (EFFEXOR-XR) 150 MG 24 hr capsule Take 1 capsule (150 mg total) by mouth every morning.  ? vitamin B-12 (CYANOCOBALAMIN) 1000 MCG tablet Take 1,000 mcg by mouth daily. 2500 mcg once daily  ? vitamin E 180 MG (400 UNITS) capsule Take 400 Units by mouth daily.  ? [DISCONTINUED] potassium chloride (KLOR-CON 10) 10 MEQ CR tablet Take 1 tablet (10 mEq total) by mouth daily.  ? ?No facility-administered encounter medications on file as of 04/02/2021.  ? ?Reviewed chart for medication changes ahead of medication coordination  call. ? ?No OVs, Consults, or hospital visits since last care coordination call/Pharmacist visit. (If appropriate, list visit date, provider name) ? ?No medication changes indicated OR if recent visit, treatment plan here. ? ?BP Readings from Last 3 Encounters:  ?11/16/20 (!) 150/78  ?02/10/20 (!) 150/90  ?11/07/19 128/78  ?  ?Lab Results  ?Component Value Date  ? HGBA1C 7.2 (A) 11/16/2020  ?  ? ?Patient obtains medications through  Vials without safety caps   90 Days  ?  ?Last adherence delivery included: ?Freestyle Libre 3 use as directed ?Levothyroxine 50 mcg 1 tablet every morning ?Atorvastatin 10 mg 1 tablet daily ?Diltiazem 360 mg 1 tablet daily ?Gabapentin 300 mg 2 capsules 3 times daily ?Diclofenac 75 mg 1 tablet 2 times daily ?Omeprazole 20 mg 1 capsule daily ?Cyclobenzaprine (FLEXERIL) 10 mg: take one tablet by mouth three times a day as needed for muscle spasms ?Tramadol (Ultram) 50 mg: take two tablets by mouth 3 times daily as needed. ?  ?  ?Patient declined meds last month due to received an acute fill for some and abundance on hand. ?sulfamethoxazole-trimethoprim (BACTRIM DS) 800-160 MG tablet: Take 1 tablet by mouth 2 (two) times daily ?Semaglutide (OZEMPIC, 0.25 OR 0.5 MG/DOSE,) 2 MG/1.5ML SOPN ?Venlafaxine XR 150 mg 24 hr: one capsule every morning ?Potassium chloride (KLOR-CON 10) 10 MEQ CR: one tablet daily ?Fenofibrate 160 mg: one tablet daily ?Metformin (GLUCOPHAGE-XR) 500 MG 24 hr: One tablet twice a day ?Metoprolol succinate (TOPROL-XL) 100 MG 24 hr take 1 tablet by mouth daily ?  ?Patient is due for next adherence delivery on: 04/11/2021 ?  ?Called patient and reviewed medications and coordinated delivery. ?  ?This delivery to include: ?Freestyle Libre 3 use as directed ?Levothyroxine 50 mcg 1 tablet every morning ?Cyclobenzaprine 10 mg: take 1 tablet three times a day as needed  ?Tramadol 50 mg: take two tablets by mouth 3 times daily as needed. ?Venlafaxine XR 150 mg: one capsule every  morning ?Fenofibrate 160 mg: one tablet daily ?Potassium chloride  10 MEQ: one tablet daily ?Metformin  500 MG: One tablet twice a day ?sulfamethoxazole-trimethoprim  800-160 MG tablet: Take 1 tablet by mouth 2  times daily ?Celebrex 200 mg take 1 capsule 2 times daily as needed.  ?Temazepam 30 mg 1 capsule at bedtime as needed.  ?  ?Patient will need a short fill: No short fill needed ?  ?Coordinated acute fill : No acute fill needed ? ?Medications declined:  ?Metoprolol succinate  100 MG take 1 tablet by mouth daily (she has # 133 on hand) ?Atorvastatin 10 mg 1 tablet daily (patient has #124 pills on hand) ?Diclofenac 75 mg 1 tablet 2 times daily (patient has plenty on hand) ?Gabapentin 300 mg 2 capsules 3 times daily (patient has plenty on hand) ? Omeprazole 20 mg 1 capsule twice daily (patient has plenty on  hand) ? ?Confirmed delivery date of 04/11/2021, advised patient that pharmacy will contact them the morning of delivery. ?  ?Patient counted out some of her pills while on the phone with me to be sure what she would need.  ? ?Care Gaps: ?AWV - completed 06/14/20 ?Last BP - 150/78 on 11/16/2020 ?Last A1C - 7.2 on 11/16/2020 ?Foot exam - never done. ?Ophthalmology exam - never done ?Hepatitis C screening - never done ?Pap smear  - never done ?Zoster vaccines - never done ?Urine microalbumin - overdue   ?Covid  booster - overdue ?  ?Star Rating Drugs: ?Atorvastatin 10 mg - last filled 03/07/2021 90 DS at [facility] ?Metformin '500mg'$  - last filled on 01/29/2021 90 DS at [facility] ?Semaglutide '2mg'$  - none listed in chart ? ?[name] CMA  ?Clinical Pharmacist Assistant ?[phone] ? ?

## 2021-04-05 ENCOUNTER — Other Ambulatory Visit: Payer: Self-pay | Admitting: Family Medicine

## 2021-04-10 ENCOUNTER — Other Ambulatory Visit: Payer: Self-pay

## 2021-04-10 MED ORDER — VENLAFAXINE HCL ER 150 MG PO CP24: 150.0000 mg | ORAL_CAPSULE | Freq: Every morning | ORAL | 0 refills | Status: DC

## 2021-04-19 ENCOUNTER — Other Ambulatory Visit: Payer: Self-pay | Admitting: Family Medicine

## 2021-04-30 NOTE — Progress Notes (Signed)
? ?Chronic Care Management ?Pharmacy Note ? ?05/02/2021 ?Name:  Christina [name] MRN:  [id number] DOB:  1965-02-14 ? ? ?Summary: ?BP at goal < 140/90 per home readings but not recent office readings ?A1c not at goal < 7% but home BGs have improved ?Pt is overdue for CPE and PCP follow up ?Pt has not started spironolactone ? ?Recommendations/Changes made from today's visit: ?-Recommend injecting 2 of the 1 mg Ozempic pens until she runs out ?-Recommended scheduling PCP visit in 1 month ?  ?Plan: ?Follow up in 4 months ? ?Subjective: ?Christina [name] is an 56 y.o. year old female who is a primary patient of Laurey Morale, MD.  The CCM team was consulted for assistance with disease management and care coordination needs.   ? ?Engaged with patient by telephone for follow up visit in response to provider referral for pharmacy case management and/or care coordination services.  ? ?Consent to Services:  ?The patient was given information about Chronic Care Management services, agreed to services, and gave verbal consent prior to initiation of services.  Please see initial visit note for detailed documentation.  ? ?Patient Care Team: ?Laurey Morale, MD as PCP - General ?Viona Gilmore, [facility] as Pharmacist (Pharmacist) ? ?Recent office visits: ?11/16/20 Alysia Penna, MD: Patient presented for DM follow up. A1c decreased to 7.2% and BP elevated in office. Prescribed spironolactone 25 mg daily and increased Ozempic to 2 mg weekly. ? ?06/14/20 Randel Pigg, LPN: Patient presented for AWV. ? ?Recent consult visits: ?08/29/20 Emelda Brothers (neurosurgery): Unable to access notes. ? ?722/22 Emelda Brothers (neurosurgery): Unable to access notes. ? ?07/11/20 [name], MD (Orthopedic Surgery): Patient presented for follow-up for left hip and right elbow pain. Prescribed Celecoxib 200 mg Oral 2 times daily PRN. ? ?Hospital visits: ?None in previous 6 months ? ?Objective: ? ?Lab Results  ?Component Value Date  ? CREATININE 0.98  11/16/2020  ? BUN 19 11/16/2020  ? GFR 64.65 11/16/2020  ? GFRNONAA 94.60 09/11/2009  ? GFRAA  05/03/2009  ?  >60        ?The eGFR has been calculated ?using the MDRD equation. ?This calculation has not been ?validated in all clinical ?situations. ?eGFR's persistently ?<60 mL/min signify ?possible Chronic Kidney Disease.  ? NA 139 11/16/2020  ? K 4.4 11/16/2020  ? CALCIUM 9.3 11/16/2020  ? CO2 28 11/16/2020  ? GLUCOSE 145 (H) 11/16/2020  ? ? ?Lab Results  ?Component Value Date/Time  ? HGBA1C 7.2 (A) 11/16/2020 01:24 PM  ? HGBA1C 8.1 (H) 07/05/2020 01:21 PM  ? HGBA1C 7.9 (A) 02/10/2020 05:24 PM  ? HGBA1C 9.3 (H) 05/02/2019 02:51 PM  ? GFR 64.65 11/16/2020 02:32 PM  ? GFR 46.25 (L) 05/02/2019 02:51 PM  ? MICROALBUR 2.5 (H) 02/21/2014 02:22 PM  ? MICROALBUR 1.7 10/05/2013 10:42 AM  ?  ?Last diabetic Eye exam: No results found for: HMDIABEYEEXA  ?Last diabetic Foot exam: No results found for: HMDIABFOOTEX  ? ?Lab Results  ?Component Value Date  ? CHOL 198 02/10/2020  ? HDL 52 02/10/2020  ? [place] 97 02/10/2020  ? LDLDIRECT 107.0 05/02/2019  ? TRIG 366 (H) 02/10/2020  ? CHOLHDL 3.8 02/10/2020  ? ? ? ?  Latest Ref Rng & Units 02/10/2020  ?  3:35 PM 05/02/2019  ?  2:51 PM 03/17/2018  ?  4:39 PM  ?Hepatic Function  ?Total Protein 6.1 - 8.1 g/dL 7.1   7.1   7.0    ?Albumin 3.5 - 5.2 g/dL  4.6   4.4    ?AST 10 - 35 U/L 20   20   21     ?ALT 6 - 29 U/L 25   20   26     ?Alk Phosphatase 39 - 117 U/L  48   50    ?Total Bilirubin 0.2 - 1.2 mg/dL 0.4   0.4   0.3    ?Bilirubin, Direct 0.0 - 0.2 mg/dL 0.1   0.1   0.1    ? ? ?Lab Results  ?Component Value Date/Time  ? TSH 2.08 11/16/2020 02:32 PM  ? TSH 2.40 02/10/2020 03:35 PM  ? FREET4 1.00 11/16/2020 02:32 PM  ? FREET4 1.2 02/10/2020 03:35 PM  ? ? ? ?  Latest Ref Rng & Units 11/16/2020  ?  2:32 PM 02/10/2020  ?  3:35 PM 05/02/2019  ?  2:51 PM  ?CBC  ?WBC 4.0 - 10.5 K/uL 7.0   7.0   5.5    ?Hemoglobin 12.0 - 15.0 g/dL 13.0   12.1   12.3    ?Hematocrit 36.0 - 46.0 % 38.8   36.5   37.8     ?Platelets 150.0 - 400.0 K/uL 186.0   184   145.0    ? ? ?No results found for: VD25OH ? ?Clinical ASCVD: No  ?The 10-year ASCVD risk score (Arnett DK, et al., 2019) is: 8% ?  Values used to calculate the score: ?    Age: 2 years ?    Sex: Female ?    Is Non-Hispanic African American: No ?    Diabetic: Yes ?    Tobacco smoker: No ?    Systolic Blood Pressure: 604 mmHg ?    Is BP treated: Yes ?    HDL Cholesterol: 52 mg/dL ?    Total Cholesterol: 198 mg/dL   ? ? ?  11/16/2020  ?  3:29 PM 06/14/2020  ?  1:37 PM 06/14/2020  ?  1:35 PM  ?Depression screen PHQ 2/9  ?Decreased Interest 3 0 0  ?Down, Depressed, Hopeless 2 0 0  ?PHQ - 2 Score 5 0 0  ?Altered sleeping 3    ?Tired, decreased energy 2    ?Change in appetite 3    ?Feeling bad or failure about yourself  1    ?Trouble concentrating 1    ?Moving slowly or fidgety/restless 0    ?Suicidal thoughts 0    ?PHQ-9 Score 15    ?Difficult doing work/chores Somewhat difficult    ?  ? ? ?Social History  ? ?Tobacco Use  ?Smoking Status Former  ?Smokeless Tobacco Never  ? ?BP Readings from Last 3 Encounters:  ?11/16/20 (!) 150/78  ?02/10/20 (!) 150/90  ?11/07/19 128/78  ? ?Pulse Readings from Last 3 Encounters:  ?11/16/20 87  ?02/10/20 81  ?11/07/19 92  ? ?Wt Readings from Last 3 Encounters:  ?11/16/20 210 lb 4 oz (95.4 kg)  ?02/10/20 212 lb 6.4 oz (96.3 kg)  ?11/07/19 210 lb 6.4 oz (95.4 kg)  ? ?BMI Readings from Last 3 Encounters:  ?11/16/20 37.84 kg/m?  ?02/10/20 38.23 kg/m?  ?11/07/19 38.48 kg/m?  ? ? ?Assessment/Interventions: Review of patient past medical history, allergies, medications, health status, including review of consultants reports, laboratory and other test data, was performed as part of comprehensive evaluation and provision of chronic care management services.  ? ?SDOH:  (Social Determinants of Health) assessments and interventions performed: No ? ?SDOH Screenings  ? ?Alcohol Screen: Not on file  ?Depression (PHQ2-9): Medium Risk  ?  PHQ-2 Score: 15   ?Financial Resource Strain: Not on file  ?Food Insecurity: No Food Insecurity  ? Worried About Charity fundraiser in the Last Year: Never true  ? Ran Out of Food in the Last Year: Never true  ?Housing: Low Risk   ? Last Housing Risk Score: 0  ?Physical Activity: Inactive  ? Days of Exercise per Week: 0 days  ? Minutes of Exercise per Session: 0 min  ?Social Connections: Moderately Isolated  ? Frequency of Communication with Friends and Family: Three times a week  ? Frequency of Social Gatherings with Friends and Family: Three times a week  ? Attends Religious Services: Never  ? Active Member of Clubs or Organizations: No  ? Attends Archivist Meetings: Never  ? Marital Status: Married  ?Stress: No Stress Concern Present  ? Feeling of Stress : Not at all  ?Tobacco Use: Medium Risk  ? Smoking Tobacco Use: Former  ? Smokeless Tobacco Use: Never  ? Passive Exposure: Not on file  ?Transportation Needs: No Transportation Needs  ? Lack of Transportation (Medical): No  ? Lack of Transportation (Non-Medical): No  ? ? ?CCM Care Plan ? ?Allergies  ?Allergen Reactions  ? Albuterol   ? Antihistamines, Loratadine-Type   ? Bee Venom   ? Clindamycin/Lincomycin   ? Codeine   ? Doxycycline   ? Lisinopril   ?  REACTION: facial swelling  ? Metronidazole   ?  REACTION: rash  ? Ondansetron Hcl   ?  REACTION: rash  ? Oxycodone-Acetaminophen   ? Tylox [Oxycodone-Acetaminophen]   ? Vancomycin   ?  REACTION: rash  ? Benadryl [Diphenhydramine] Nausea And Vomiting  ? Lidocaine Rash  ? ? ?Medications Reviewed Today   ? ? Reviewed by Laurey Morale, MD (Physician) on 11/16/20 at 1320  Med List Status: <None>  ? ?Medication Order Taking? Sig Documenting Provider Last Dose Status Informant  ?aspirin 81 MG EC tablet [id number] Yes Take 81 mg by mouth daily. [provider] Taking Active   ?atorvastatin (LIPITOR) 10 MG tablet [id number] Yes TAKE ONE TABLET BY MOUTH ONCE DAILY Laurey Morale, MD Taking Active   ?celecoxib (CELEBREX)  200 MG capsule [id number] Yes Take 1 capsule (200 mg total) by mouth 2 (two) times daily as needed. [name], MD Taking Active   ?cephALEXin (KEFLEX) 500 MG capsule [id number] Yes TAKE 1 CAPSULE BY

## 2021-05-01 ENCOUNTER — Other Ambulatory Visit: Payer: Self-pay | Admitting: Family Medicine

## 2021-05-01 ENCOUNTER — Ambulatory Visit (INDEPENDENT_AMBULATORY_CARE_PROVIDER_SITE_OTHER): Payer: Self-pay | Admitting: Pharmacist

## 2021-05-01 DIAGNOSIS — I10 Essential (primary) hypertension: Secondary | ICD-10-CM

## 2021-05-01 DIAGNOSIS — E1165 Type 2 diabetes mellitus with hyperglycemia: Secondary | ICD-10-CM

## 2021-05-01 NOTE — Telephone Encounter (Signed)
Last refill- 30 caps, 5 refills ?Last OV--11-16-20 ? ?No future OV scheduled.  ?

## 2021-05-02 NOTE — Patient Instructions (Signed)
Hi Christina [name], ? ?It was great to get to catch up again! Don't forget to schedule your physical with Dr. Sarajane Jews for the next month or so as you are overdue for several labs and he wants to check back in on your blood pressure. ? ?Please reach out to me if you have any questions or need anything before our follow up! ? ?Best, ?Maddie ? ?[name], PharmD, BCACP ?Clinical Pharmacist ?Therapist, music at [place] ?[phone] ? ? Visit Information ? ? Goals Addressed   ?None ?  ? ?Patient Care Plan: Christina [name]  ?  ? ?Problem Identified: Problem: Hypertension, Hyperlipidemia, Diabetes, GERD, Hypothyroidism, Anxiety, Osteoarthritis and insomnia, vitamin B12 deficiency   ?  ? ?Long-Range Goal: Patient-Specific Goal   ?Start Date: 04/27/2020  ?Expected End Date: 04/27/2021  ?Recent Progress: Not on track  ?Priority: High  ?Note:   ?Current Barriers:  ?Unable to independently monitor therapeutic efficacy ?Unable to achieve control of diabetes  ?Unable to maintain control of blood pressure ?Suboptimal therapeutic regimen for cholesterol ? ? ?Pharmacist Clinical Goal(s):  ?Patient will achieve adherence to monitoring guidelines and medication adherence to achieve therapeutic efficacy ?achieve control of diabetes as evidenced by A1c  through collaboration with PharmD and provider.  ? ?Interventions: ?1:1 collaboration with Laurey Morale, MD regarding development and update of comprehensive plan of care as evidenced by provider attestation and co-signature ?Inter-disciplinary care team collaboration (see longitudinal plan of care) ?Comprehensive medication review performed; medication list updated in electronic medical record ? ?Hypertension (BP goal <140/90) ?-Uncontrolled ?-Current treatment: ?diltiazem (Cardizem CD) '360mg'$ , 1 capsule daily - in PM - Appropriate, Query effective, Safe, Accessible ?metoprolol succinate '100mg'$  once daily  - in AM - Appropriate, Query effective, Safe, Accessible ?Spironolactone 25 mg 1  tablet daily - never started  ?-Medications previously tried: lisinopril (facial swelling) ?metoprolol succinate '100mg'$ , HCTZ '25mg'$  (patient reports these were stopped when she started diltiazem) ?-Current home readings: 128/88 HR 83 (once or twice a week) - arm cuff has brought it in ?-Current dietary habits: did not discuss ?-Current exercise habits: no structured exercise; limited in mobility ?-Reports hypotensive/hypertensive symptoms ?-Educated on BP goals and benefits of medications for prevention of heart attack, stroke and kidney damage; ?Daily salt intake goal < 2300 mg; ?Exercise goal of 150 minutes per week; ?Importance of home blood pressure monitoring; ?Proper BP monitoring technique; ?-Counseled to monitor BP at home a few times weekly, document, and provide log at future appointments ?-Counseled on diet and exercise extensively ?Recommended to continue current medication ? ?Hyperlipidemia: (LDL goal < 100) ?-Controlled ?-Current treatment: ?atorvastatin '10mg'$ , 1 tablet daily -  Appropriate, Query effective, Safe, Accessible ?fenofibrate '160mg'$ , 1 tablet daily - Appropriate, Query effective, Safe, Accessible ?-Medications previously tried: none  ?-Current dietary patterns: patient is no longer eating red meat ?-Current exercise habits: limited in mobility ?-Educated on Cholesterol goals;  ?Benefits of statin for ASCVD risk reduction; ?Importance of limiting foods high in cholesterol; ?Exercise goal of 150 minutes per week; ?-Counseled on diet and exercise extensively ?Recommended repeat lipid panel. ? ?Diabetes (A1c goal <7%) ?-Uncontrolled ?-Current medications: ?semaglutide (Ozempic) 2 mg once a week - Appropriate, Query effective, Safe, Accessible ?metformin XR '500mg'$  1 tablet twice daily  - Appropriate, Query effective, Safe, Accessible ?-Medications previously tried: glipizide  ?-Current home glucose readings: using a CGM ?fasting glucose: 128 today; average 131 (last 7 days), 136 (last 14 days) ?post  prandial glucose: n/a ?-Denies hypoglycemic/hyperglycemic symptoms ?-Current meal patterns: sometimes eating once or twice a  day (needs to eat cheaper/easier foods) ?breakfast: cinnamon poptart  ?lunch: did not discuss ?dinner: did not discuss ?snacks: did not discuss ?drinks: half and half tea, water ?-Current exercise: limited in mobility ?-Educated on A1c and blood sugar goals; ?Exercise goal of 150 minutes per week; ?Benefits of routine self-monitoring of blood sugar; ?Carbohydrate counting and/or plate method ?-Counseled to check feet daily and get yearly eye exams ?-Counseled on diet and exercise extensively ?Recommended to continue current medication ? ?Depression/Anxiety (Goal: minimize symptoms) ?-Controlled ?-Current treatment: ?venlafaxine XR '150mg'$ , 1 capsule one daily with breakfast - Appropriate, Effective, Safe, Accessible ?-Medications previously tried/failed: sertraline ?-PHQ9: 14  ?-GAD7: n/a ?-Educated on Benefits of medication for symptom control ?-Recommended to continue current medication ? ?Insomnia (Goal: improve quality and quantity of sleep) ?-Controlled ?-Current treatment  ?temazepam '30mg'$ , 1 capsule at bedtime as needed for sleep - Appropriate, Effective, Query Safe, Accessible ?Melatonin 10 mg, 1 capsule at bedtime - Appropriate, Effective, Query Safe, Accessible ?-Medications previously tried: n/a  ?-Counseled on non-pharmacological interventions for insomnia. (Avoid napping, limit exposure to technology near bedtime, etc). ; discussed trying 5 mg dose instead of 10 mg and stressed the importance of timed release to help with staying asleep ? ?Hypothyroidism (Goal: TSH 0.35-4.5) ?-Controlled ?-Current treatment  ?levothyroxine 42mg, 1 tablet daily - Appropriate, Effective, Safe, Accessible ?-Medications previously tried: none  ?-Recommended to continue current medication ? ?Arthritis/muscle spasms (Goal: minimize pain) ?-Controlled ?-Current treatment  ?diclofenac '75mg'$ , 1 tablet twice  daily - tries to not take scheduled - Appropriate, Effective, Query Safe, Accessible ?tramadol '50mg'$ , 2 tablets three times daily as needed for moderate pain - Appropriate, Effective, Safe, Accessible ?hydromorphone '4mg'$ , 1 tablet every six hours as needed for severe pain  - Appropriate, Effective, Safe, Accessible ?cyclobenzaprine '10mg'$ , 1 tablet three times daily as needed for muscle spasms - Appropriate, Effective, Query Safe, Accessible ?benadryl 25 mg - takes this only with the hydromorphone ?Voltaren gel (diclofenac) as needed - Appropriate, Effective, Safe, Accessible ?Celecoxib 200 mg 1 tablet daily (alternates with diclofenac) - Appropriate, Effective, Query Safe, Accessible ?-Medications previously tried: Opana (oxymorphone) ?-Recommended to continue current medication ? ?Vitamin B12 deficiency (Goal: 211-911) ?-Not ideally controlled ?-Current treatment  ?Vitamin B12 1000 mcg 1 tablet daily ?-Medications previously tried: none  ?-Recommended decreasing to every other day and then buying 500 mcg to take daily to avoid oversupplementation ? ?GERD (Goal: minimize symptoms) ?-Controlled ?-Current treatment  ?omeprazole '20mg'$ , 1 capsule daily as needed - Appropriate, Effective, Safe, Accessible ?-Medications previously tried: none  ?-Counseled on non-pharmacological interventions for acid reflux. Take measures to prevent acid reflux, such as avoiding spicy foods, avoiding caffeine, avoid laying down a few hours after eating, and raising the head of the bed. ? ?Neuropathy (Goal: minimize pain) ?-Controlled ?-Current treatment  ?gabapentin '300mg'$ , 2 capsules three times daily - Appropriate, Effective, Safe, Accessible ?-Medications previously tried: none  ?-Recommended to continue current medication ? ?Skin lesions (Goal: minimize symptoms) ?-Controlled ?-Current treatment  ?mupirocin 2% ointment, apply three times daily  ?Bactrim DS, 1 tablet twice daily ?triamcinolone 0.1% cream, apply twice daily ?-Medications  previously tried: none  ?-Recommended to continue current medication ? ? ?Health Maintenance ?-Vaccine gaps: shingles, COVID booster ?-Current therapy:  ?Biotin 5000 mcg (collagen support) 1 tablet daily ?

## 2021-05-05 ENCOUNTER — Other Ambulatory Visit: Payer: Self-pay | Admitting: Family Medicine

## 2021-05-07 ENCOUNTER — Telehealth: Payer: PPO

## 2021-05-07 NOTE — Telephone Encounter (Signed)
Last refill- 02/28/2021---240 tabs, 1 refill.    2 tabs po tid. ? ?Last OV- 11/16/2020 ? ?No future OV schedule.    Pharmacy requesting refill to be placed on file. ?

## 2021-05-26 DIAGNOSIS — E039 Hypothyroidism, unspecified: Secondary | ICD-10-CM

## 2021-05-26 DIAGNOSIS — F32A Depression, unspecified: Secondary | ICD-10-CM

## 2021-05-26 DIAGNOSIS — I10 Essential (primary) hypertension: Secondary | ICD-10-CM

## 2021-05-26 DIAGNOSIS — E785 Hyperlipidemia, unspecified: Secondary | ICD-10-CM

## 2021-05-26 DIAGNOSIS — E1165 Type 2 diabetes mellitus with hyperglycemia: Secondary | ICD-10-CM

## 2021-05-30 ENCOUNTER — Telehealth: Payer: Self-pay

## 2021-05-30 ENCOUNTER — Telehealth: Payer: Self-pay | Admitting: Pharmacist

## 2021-05-30 NOTE — Chronic Care Management (AMB) (Addendum)
Chronic Care Management Pharmacy Assistant   Name: Christina [name]  MRN: [id number] DOB: 08-22-1965  Reason for Encounter: Medication Review / Medication Coordination Call   Conditions to be addressed/monitored: HTN  Recent office visits:  None  Recent consult visits:  None  Hospital visits:  None  Medications: Outpatient Encounter Medications as of 05/30/2021  Medication Sig   aspirin 81 MG EC tablet Take 81 mg by mouth daily.   atorvastatin (LIPITOR) 10 MG tablet TAKE ONE TABLET BY MOUTH ONCE DAILY   celecoxib (CELEBREX) 200 MG capsule TAKE ONE CAPSULE BY MOUTH twice A DAY AS NEEDED   cephALEXin (KEFLEX) 500 MG capsule TAKE 1 CAPSULE BY MOUTH THREE TIMES A DAY   cholecalciferol (VITAMIN D) 1000 UNITS tablet Take 1,000 Units by mouth daily.   Cinnamon 500 MG TABS Take 2 tablets by mouth daily.   clobetasol (TEMOVATE) 0.05 % external solution Apply 1 application topically 2 (two) times daily.   Continuous Blood Gluc Receiver (FREESTYLE LIBRE 14 DAY READER) DEVI 1 application by Does not apply route every 14 (fourteen) days.   Continuous Blood Gluc Sensor (FREESTYLE LIBRE 14 DAY SENSOR) MISC 1 application by Does not apply route every 14 (fourteen) days.   [facility] ULTRA THIN LANCETS MISC 1 strip by Does not apply route as directed.   cyclobenzaprine (FLEXERIL) 10 MG tablet TAKE 1 TABLET BY MOUTH THREE TIMES A DAY AS NEEDED FOR MUSCLE SPASMS   desonide (DESOWEN) 0.05 % cream Apply topically 2 (two) times daily.   diclofenac (VOLTAREN) 75 MG EC tablet TAKE ONE TABLET BY MOUTH TWICE DAILY   diltiazem (CARDIZEM CD) 360 MG 24 hr capsule Take 1 capsule (360 mg total) by mouth daily.   diphenhydrAMINE (BENADRYL) 25 mg capsule Take 25 mg by mouth as needed.   EPINEPHrine 0.3 mg/0.3 mL IJ SOAJ injection Inject 0.3 mg into the muscle as needed for anaphylaxis.   fenofibrate 160 MG tablet TAKE ONE TABLET BY MOUTH ONCE DAILY   gabapentin (NEURONTIN) 300 MG capsule TAKE TWO CAPSULES BY MOUTH  THREE TIMES DAILY   glucose blood (TRUETEST TEST) test strip Use as instructed   HYDROmorphone (DILAUDID) 4 MG tablet Take 1 tablet (4 mg total) by mouth every 6 (six) hours as needed for severe pain.   levothyroxine (SYNTHROID) 50 MCG tablet TAKE ONE TABLET BY MOUTH EVERY MORNING   metFORMIN (GLUCOPHAGE-XR) 500 MG 24 hr tablet TAKE 1 TABLET BY MOUTH TWICE A DAY WITH MEAL   metoprolol succinate (TOPROL-XL) 100 MG 24 hr tablet TAKE ONE TABLET BY MOUTH ONCE DAILY WITH FOOD   mupirocin ointment (BACTROBAN) 2 % Apply topically 3 (three) times daily.   omeprazole (PRILOSEC) 20 MG capsule TAKE ONE CAPSULE BY MOUTH TWICE DAILY   potassium chloride (KLOR-CON M) 10 MEQ tablet TAKE ONE TABLET BY MOUTH ONCE DAILY   promethazine (PHENERGAN) 25 MG tablet Take 1 tablet (25 mg total) by mouth every 6 (six) hours as needed for nausea.   Semaglutide, 2 MG/DOSE, 8 MG/3ML SOPN Inject 2 mg as directed once a week.   spironolactone (ALDACTONE) 25 MG tablet Take 1 tablet (25 mg total) by mouth daily.   sulfamethoxazole-trimethoprim (BACTRIM DS) 800-160 MG tablet Take 1 tablet by mouth 2 (two) times daily.   temazepam (RESTORIL) 30 MG capsule TAKE ONE CAPSULE BY MOUTH EVERYDAY AT BEDTIME AS NEEDED FOR SLEEP   traMADol (ULTRAM) 50 MG tablet TAKE TWO TABLETS BY MOUTH three times daily AS NEEDED FOR moderate pain  triamcinolone cream (KENALOG) 0.1 % Apply 1 application topically 2 (two) times daily.   venlafaxine XR (EFFEXOR-XR) 150 MG 24 hr capsule Take 1 capsule (150 mg total) by mouth every morning.   vitamin B-12 (CYANOCOBALAMIN) 1000 MCG tablet Take 1,000 mcg by mouth daily. 2500 mcg once daily   vitamin E 180 MG (400 UNITS) capsule Take 400 Units by mouth daily.   [DISCONTINUED] potassium chloride (KLOR-CON 10) 10 MEQ CR tablet Take 1 tablet (10 mEq total) by mouth daily.   No facility-administered encounter medications on file as of 05/30/2021.   Reviewed chart for medication changes ahead of medication  coordination call.  No OVs, Consults, or hospital visits since last care coordination call/Pharmacist visit. (If appropriate, list visit date, provider name)  No medication changes indicated OR if recent visit, treatment plan here.  BP Readings from Last 3 Encounters:  11/16/20 (!) 150/78  02/10/20 (!) 150/90  11/07/19 128/78    Lab Results  Component Value Date   HGBA1C 7.2 (A) 11/16/2020     Patient obtains medications through  Vials without safety caps  (other) Days    Last adherence delivery included: Freestyle Libre 3 use as directed Levothyroxine 50 mcg 1 tablet every morning Cyclobenzaprine 10 mg: take 1 tablet three times a day as needed  Tramadol 50 mg: take two tablets by mouth 3 times daily as needed. Venlafaxine XR 150 mg: one capsule every morning Fenofibrate 160 mg: one tablet daily Potassium chloride  10 MEQ: one tablet daily Metformin  500 MG: One tablet twice a day sulfamethoxazole-trimethoprim  800-160 MG tablet: Take 1 tablet by mouth 2  times daily Celebrex 200 mg take 1 capsule 2 times daily as needed.  Temazepam 30 mg 1 capsule at bedtime as needed.      Patient declined meds last month due to received an acute fill for some and abundance on hand. Metoprolol succinate  100 MG take 1 tablet by mouth daily (she has # 133 on hand) Atorvastatin 10 mg 1 tablet daily (patient has #124 pills on hand) Diclofenac 75 mg 1 tablet 2 times daily (patient has plenty on hand) Gabapentin 300 mg 2 capsules 3 times daily (patient has plenty on hand)  Omeprazole 20 mg 1 capsule twice daily (patient has plenty on hand)   Patient is due for next adherence delivery on: 06/11/2021   Called patient and reviewed medications and coordinated delivery.   This delivery to include: Levothyroxine 50 mcg 1 tablet every morning Cyclobenzaprine 10 mg: take 1 tablet three times a day as needed  Celebrex 200 mg take 1 capsule 2 times daily as needed.  Temazepam 30 mg 1 capsule at  bedtime as needed.  Metoprolol succinate  100 MG take 1 tablet by mouth daily  Diclofenac 75 mg 1 tablet 2 times daily  Gabapentin 300 mg 2 capsules 3 times daily  Omeprazole 20 mg 1 capsule twice daily   Diltiazem 360 mg 1 tablet daily  Patient will need a short fill: No short fill needed   Coordinated acute fill : No acute fill needed  Medication last filled 90 DS on 04/22/2021: Venlafaxine XR 150 mg: one capsule every morning Fenofibrate 160 mg: one tablet daily Potassium chloride  10 MEQ: one tablet daily Metformin  500 MG: One tablet twice a day sulfamethoxazole-trimethoprim  800-160 MG tablet: Take 1 tablet by mouth 2  times daily Freestyle Libre 14 day sensor Freestyle Libre 14 day receiver   Medications declined:  Tramadol 50 mg:  take two tablets by mouth 3 times daily as needed. Atorvastatin 10 mg 1 tablet daily (patient counted) Spironolactone 25 mg 1 tablet daily (patient counted)  Confirmed delivery date of 06/11/2021, advised patient that pharmacy will contact them the morning of delivery.     Care Gaps: AWV - completed 06/14/20 Last BP - 150/78 on 11/16/2020 Last A1C - 7.2 on 11/16/2020 Foot exam - never done. Ophthalmology exam - never done Hepatitis C screening - never done Pap smear  - never done Zoster vaccines - never done Urine microalbumin - overdue   Covid  booster - overdue   Star Rating Drugs: Atorvastatin 10 mg - last filled 03/07/2021 90 DS at [facility] Metformin '500mg'$  - last filled on 04/08/2021 90 DS at [facility] Semaglutide '2mg'$  - none listed in chart  [place] Pharmacist Assistant [phone]

## 2021-05-30 NOTE — Telephone Encounter (Signed)
Pt Ozempic received on 05/30/2021, placed in the refrigerator pt to pick up  ?

## 2021-06-04 ENCOUNTER — Other Ambulatory Visit: Payer: Self-pay | Admitting: Family Medicine

## 2021-06-13 ENCOUNTER — Telehealth: Payer: Self-pay | Admitting: Family Medicine

## 2021-06-13 NOTE — Telephone Encounter (Signed)
Left message for patient to call back and schedule Medicare Annual Wellness Visit (AWV) either virtually or in office. Left  my Herbie Drape number [phone]   Last AWV 06/14/20 ; please schedule at anytime with [facility] Nurse Health Advisor 1 or 2

## 2021-06-18 ENCOUNTER — Other Ambulatory Visit: Payer: Self-pay | Admitting: Family Medicine

## 2021-07-01 ENCOUNTER — Other Ambulatory Visit: Payer: Self-pay | Admitting: Family Medicine

## 2021-07-01 ENCOUNTER — Other Ambulatory Visit: Payer: Self-pay

## 2021-07-01 ENCOUNTER — Telehealth: Payer: Self-pay | Admitting: Pharmacist

## 2021-07-01 DIAGNOSIS — G8929 Other chronic pain: Secondary | ICD-10-CM

## 2021-07-01 DIAGNOSIS — E785 Hyperlipidemia, unspecified: Secondary | ICD-10-CM

## 2021-07-01 MED ORDER — VENLAFAXINE HCL ER 150 MG PO CP24: 150.0000 mg | ORAL_CAPSULE | Freq: Every morning | ORAL | 0 refills | Status: DC

## 2021-07-01 MED ORDER — POTASSIUM CHLORIDE CRYS ER 10 MEQ PO TBCR: 10.0000 meq | EXTENDED_RELEASE_TABLET | Freq: Every day | ORAL | 0 refills | Status: DC

## 2021-07-01 MED ORDER — FENOFIBRATE 160 MG PO TABS: 160.0000 mg | ORAL_TABLET | Freq: Every day | ORAL | 0 refills | Status: DC

## 2021-07-01 NOTE — Chronic Care Management (AMB) (Unsigned)
Chronic Care Management Pharmacy Assistant   Name: Christina [name]  MRN: [id number] DOB: [birth year]  Reason for Encounter: Medication Review / Medication Coordination Call  Recent office visits:  None  Recent consult visits:  None  Hospital visits:  None  Medications: Outpatient Encounter Medications as of 07/01/2021  Medication Sig   aspirin 81 MG EC tablet Take 81 mg by mouth daily.   atorvastatin (LIPITOR) 10 MG tablet TAKE ONE TABLET BY MOUTH ONCE DAILY   celecoxib (CELEBREX) 200 MG capsule TAKE ONE CAPSULE BY MOUTH twice A DAY AS NEEDED   cephALEXin (KEFLEX) 500 MG capsule TAKE 1 CAPSULE BY MOUTH THREE TIMES A DAY   cholecalciferol (VITAMIN D) 1000 UNITS tablet Take 1,000 Units by mouth daily.   Cinnamon 500 MG TABS Take 2 tablets by mouth daily.   clobetasol (TEMOVATE) 0.05 % external solution Apply 1 application topically 2 (two) times daily.   Continuous Blood Gluc Receiver (FREESTYLE LIBRE 14 DAY READER) DEVI 1 application by Does not apply route every 14 (fourteen) days.   Continuous Blood Gluc Sensor (FREESTYLE LIBRE 14 DAY SENSOR) MISC 1 application by Does not apply route every 14 (fourteen) days.   [facility] ULTRA THIN LANCETS MISC 1 strip by Does not apply route as directed.   cyclobenzaprine (FLEXERIL) 10 MG tablet TAKE 1 TABLET BY MOUTH THREE TIMES A DAY AS NEEDED FOR MUSCLE SPASMS   desonide (DESOWEN) 0.05 % cream Apply topically 2 (two) times daily.   diclofenac (VOLTAREN) 75 MG EC tablet TAKE ONE TABLET BY MOUTH TWICE DAILY   diltiazem (CARDIZEM CD) 360 MG 24 hr capsule TAKE ONE CAPSULE BY MOUTH DAILY   diphenhydrAMINE (BENADRYL) 25 mg capsule Take 25 mg by mouth as needed.   EPINEPHrine 0.3 mg/0.3 mL IJ SOAJ injection Inject 0.3 mg into the muscle as needed for anaphylaxis.   fenofibrate 160 MG tablet Take 1 tablet (160 mg total) by mouth daily.   gabapentin (NEURONTIN) 300 MG capsule TAKE TWO CAPSULES BY MOUTH THREE TIMES DAILY   glucose blood (TRUETEST TEST) test  strip Use as instructed   HYDROmorphone (DILAUDID) 4 MG tablet Take 1 tablet (4 mg total) by mouth every 6 (six) hours as needed for severe pain.   levothyroxine (SYNTHROID) 50 MCG tablet TAKE ONE TABLET BY MOUTH EVERY MORNING   metFORMIN (GLUCOPHAGE-XR) 500 MG 24 hr tablet TAKE 1 TABLET BY MOUTH TWICE A DAY WITH MEAL   metoprolol succinate (TOPROL-XL) 100 MG 24 hr tablet TAKE ONE TABLET BY MOUTH ONCE DAILY WITH FOOD   mupirocin ointment (BACTROBAN) 2 % Apply topically 3 (three) times daily.   omeprazole (PRILOSEC) 20 MG capsule TAKE ONE CAPSULE BY MOUTH TWICE DAILY   potassium chloride (KLOR-CON M) 10 MEQ tablet Take 1 tablet (10 mEq total) by mouth daily.   promethazine (PHENERGAN) 25 MG tablet Take 1 tablet (25 mg total) by mouth every 6 (six) hours as needed for nausea.   Semaglutide, 2 MG/DOSE, 8 MG/3ML SOPN Inject 2 mg as directed once a week.   spironolactone (ALDACTONE) 25 MG tablet Take 1 tablet (25 mg total) by mouth daily.   sulfamethoxazole-trimethoprim (BACTRIM DS) 800-160 MG tablet Take 1 tablet by mouth 2 (two) times daily.   temazepam (RESTORIL) 30 MG capsule TAKE ONE CAPSULE BY MOUTH EVERYDAY AT BEDTIME AS NEEDED FOR SLEEP   traMADol (ULTRAM) 50 MG tablet TAKE TWO TABLETS BY MOUTH three times daily AS NEEDED FOR moderate pain   triamcinolone cream (KENALOG) 0.1 % Apply  1 application topically 2 (two) times daily.   venlafaxine XR (EFFEXOR-XR) 150 MG 24 hr capsule Take 1 capsule (150 mg total) by mouth every morning.   vitamin B-12 (CYANOCOBALAMIN) 1000 MCG tablet Take 1,000 mcg by mouth daily. 2500 mcg once daily   vitamin E 180 MG (400 UNITS) capsule Take 400 Units by mouth daily.   [DISCONTINUED] potassium chloride (KLOR-CON 10) 10 MEQ CR tablet Take 1 tablet (10 mEq total) by mouth daily.   No facility-administered encounter medications on file as of 07/01/2021.   Reviewed chart for medication changes ahead of medication coordination call.  No OVs, Consults, or hospital  visits since last care coordination call/Pharmacist visit. (If appropriate, list visit date, provider name)  No medication changes indicated OR if recent visit, treatment plan here.  BP Readings from Last 3 Encounters:  11/16/20 (!) 150/78  02/10/20 (!) 150/90  11/07/19 128/78    Lab Results  Component Value Date   HGBA1C 7.2 (A) 11/16/2020     Patient obtains medications through  Vials without safety caps  (other) Days    Last adherence delivery included: Levothyroxine 50 mcg 1 tablet every morning Cyclobenzaprine 10 mg: take 1 tablet three times a day as needed  Celebrex 200 mg take 1 capsule 2 times daily as needed.  Temazepam 30 mg 1 capsule at bedtime as needed.  Metoprolol succinate  100 MG take 1 tablet by mouth daily  Diclofenac 75 mg 1 tablet 2 times daily  Gabapentin 300 mg 2 capsules 3 times daily  Omeprazole 20 mg 1 capsule twice daily   Diltiazem 360 mg 1 tablet daily   Medication last filled [facility] DS on 04/22/2021: Venlafaxine XR 150 mg: one capsule every morning Fenofibrate 160 mg: one tablet daily Potassium chloride  10 MEQ: one tablet daily Metformin  500 MG: One tablet twice a day sulfamethoxazole-trimethoprim  800-160 MG tablet: Take 1 tablet by mouth 2  times daily Freestyle Libre 14 day sensor Freestyle Libre 14 day receiver   Patient declined meds last month due to received an acute fill for some and abundance on hand. Tramadol 50 mg: take two tablets by mouth 3 times daily as needed. Atorvastatin 10 mg 1 tablet daily (patient counted) Spironolactone 25 mg 1 tablet daily (patient counted)   Patient is due for next adherence delivery on: 07/11/2021   Called patient and reviewed medications and coordinated delivery.   This delivery to include: Cyclobenzaprine 10 mg: take 1 tablet three times a day as needed  Temazepam 30 mg 1 capsule at bedtime as needed.  Potassium chloride  10 MEQ: one tablet daily Metformin  500 MG: One tablet twice a  day Fenofibrate 160 mg: one tablet daily Bactrim DS take 1 tablet twice daily Venlafaxine XR 150 mg: one capsule every morning Freestyle Libre 14 day sensor Atorvastatin 10 mg 1 tablet daily  Spironolactone 25 mg 1 tablet daily  Patient will need a short fill: No short fill needed   Coordinated acute fill : No acute fill needed   Medication last filled not due until August: Levothyroxine 50 mcg 1 tablet every morning Metoprolol succinate  100 MG take 1 tablet by mouth daily  Diclofenac 75 mg 1 tablet 2 times daily  Gabapentin 300 mg 2 capsules 3 times daily  Omeprazole 20 mg 1 capsule twice daily   Diltiazem 360 mg 1 tablet daily Verified with patient  Medications declined:  Tramadol 50 mg 2 tablets 3 times daily as needed  Celebrex 200  mg 1 capsule 2 times daily as needed.  Patient takes these are prn and states she has plenty   Confirmed delivery date of 07/11/2021, advised patient that pharmacy will contact them the morning of delivery.    Care Gaps: AWV - completed 06/14/2020 Last BP - 150/78 on 11/16/2020 Last A1C - 7.2 on 11/16/2020 Foot exam - never done Eye exam - never done Hep C Screen - never done Papsmear - never done Shingrix - never done Covid booster - overdue HGA1C - overdue  Star Rating Drugs: Atorvastatin 10 mg - last filled 03/07/2021 90 DS at [facility] Metformin '500mg'$  - last filled 04/08/2021 90 DS at [facility] Semaglutide '2mg'$  - no fill history  [place] Pharmacist Assistant [phone]

## 2021-07-04 ENCOUNTER — Other Ambulatory Visit: Payer: Self-pay | Admitting: Family Medicine

## 2021-07-04 DIAGNOSIS — G8929 Other chronic pain: Secondary | ICD-10-CM

## 2021-07-05 ENCOUNTER — Telehealth: Payer: Self-pay | Admitting: Family Medicine

## 2021-07-05 NOTE — Telephone Encounter (Signed)
Left message for patient to call back and schedule Medicare Annual Wellness Visit (AWV) either virtually or in office. Left  my Herbie Drape number [phone]   Last AWV ;06/14/20  please schedule at anytime with [facility] Nurse Health Advisor 1 or 2

## 2021-07-09 ENCOUNTER — Ambulatory Visit (INDEPENDENT_AMBULATORY_CARE_PROVIDER_SITE_OTHER): Payer: PPO

## 2021-07-09 VITALS — Ht 62.0 in | Wt 210.0 lb

## 2021-07-09 DIAGNOSIS — Z0000 Encounter for general adult medical examination without abnormal findings: Secondary | ICD-10-CM | POA: Diagnosis not present

## 2021-07-09 NOTE — Progress Notes (Signed)
Subjective:   Christina [name] is a 56 y.o. female who presents for Medicare Annual (Subsequent) preventive examination.  Review of Systems    Virtual Visit via Telephone Note  I connected with  Christina [name] on 07/09/21 at  8:45 AM EDT by telephone and verified that I am speaking with the correct person using two identifiers.  Location: Patient: Home Provider: Office Persons participating in the virtual visit: patient/Nurse Health Advisor   I discussed the limitations, risks, security and privacy concerns of performing an evaluation and management service by telephone and the availability of in person appointments. The patient expressed understanding and agreed to proceed.  Interactive audio and video telecommunications were attempted between this nurse and patient, however failed, due to patient having technical difficulties OR patient did not have access to video capability.  We continued and completed visit with audio only.  Some vital signs may be absent or patient reported.   Criselda Peaches, LPN  Cardiac Risk Factors include: advanced age (>85mn, >>62women);diabetes mellitus;hypertension     Objective:    Today's Vitals   07/09/21 0850  Weight: 210 lb (95.3 kg)  Height: '5\' 2"'$  (1.575 m)   Body mass index is 38.41 kg/m.     07/09/2021    9:18 AM 06/14/2020    1:35 PM 05/02/2015    1:16 PM 05/25/2014    1:12 PM  Advanced Directives  Does Patient Have a Medical Advance Directive? Yes Yes No Yes  Type of [place] will    Does patient want to make changes to medical advance directive? No - Patient declined No - Patient declined    [place]? No - copy requested No - copy requested    Would patient like information on creating a medical advance directive? No - Patient declined       Current Medications (verified) Outpatient Encounter Medications as of 07/09/2021   Medication Sig   aspirin 81 MG EC tablet Take 81 mg by mouth daily.   atorvastatin (LIPITOR) 10 MG tablet TAKE ONE TABLET BY MOUTH ONCE DAILY   celecoxib (CELEBREX) 200 MG capsule TAKE ONE CAPSULE BY MOUTH twice A DAY AS NEEDED   cephALEXin (KEFLEX) 500 MG capsule TAKE 1 CAPSULE BY MOUTH THREE TIMES A DAY   cholecalciferol (VITAMIN D) 1000 UNITS tablet Take 1,000 Units by mouth daily.   Cinnamon 500 MG TABS Take 2 tablets by mouth daily.   clobetasol (TEMOVATE) 0.05 % external solution Apply 1 application topically 2 (two) times daily.   Continuous Blood Gluc Receiver (FREESTYLE LIBRE 14 DAY READER) DEVI 1 application by Does not apply route every 14 (fourteen) days.   Continuous Blood Gluc Sensor (FREESTYLE LIBRE 2 SENSOR) MISC USE TO check blood glucose AS DIRECTED AND CHANGE sensor every 14 DAYS   [employer] ULTRA THIN LANCETS MISC 1 strip by Does not apply route as directed.   cyclobenzaprine (FLEXERIL) 10 MG tablet TAKE ONE TABLET BY MOUTH THREE TIMES DAILY AS NEEDED FOR muscle SPASMS   desonide (DESOWEN) 0.05 % cream Apply topically 2 (two) times daily.   diclofenac (VOLTAREN) 75 MG EC tablet TAKE ONE TABLET BY MOUTH TWICE DAILY   diltiazem (CARDIZEM CD) 360 MG 24 hr capsule TAKE ONE CAPSULE BY MOUTH DAILY   diphenhydrAMINE (BENADRYL) 25 mg capsule Take 25 mg by mouth as needed.   EPINEPHrine 0.3 mg/0.3 mL IJ SOAJ injection Inject 0.3 mg  into the muscle as needed for anaphylaxis.   fenofibrate 160 MG tablet Take 1 tablet (160 mg total) by mouth daily.   gabapentin (NEURONTIN) 300 MG capsule TAKE TWO CAPSULES BY MOUTH THREE TIMES DAILY   glucose blood (TRUETEST TEST) test strip Use as instructed   HYDROmorphone (DILAUDID) 4 MG tablet Take 1 tablet (4 mg total) by mouth every 6 (six) hours as needed for severe pain.   levothyroxine (SYNTHROID) 50 MCG tablet TAKE ONE TABLET BY MOUTH EVERY MORNING   metFORMIN (GLUCOPHAGE-XR) 500 MG 24 hr tablet TAKE 1 TABLET BY MOUTH TWICE A DAY WITH MEAL    metoprolol succinate (TOPROL-XL) 100 MG 24 hr tablet TAKE ONE TABLET BY MOUTH ONCE DAILY WITH FOOD   mupirocin ointment (BACTROBAN) 2 % Apply topically 3 (three) times daily.   omeprazole (PRILOSEC) 20 MG capsule TAKE ONE CAPSULE BY MOUTH TWICE DAILY   potassium chloride (KLOR-CON M) 10 MEQ tablet Take 1 tablet (10 mEq total) by mouth daily.   promethazine (PHENERGAN) 25 MG tablet Take 1 tablet (25 mg total) by mouth every 6 (six) hours as needed for nausea.   Semaglutide, 2 MG/DOSE, 8 MG/3ML SOPN Inject 2 mg as directed once a week.   spironolactone (ALDACTONE) 25 MG tablet Take 1 tablet (25 mg total) by mouth daily.   sulfamethoxazole-trimethoprim (BACTRIM DS) 800-160 MG tablet Take 1 tablet by mouth 2 (two) times daily.   temazepam (RESTORIL) 30 MG capsule TAKE ONE CAPSULE BY MOUTH EVERYDAY AT BEDTIME AS NEEDED FOR SLEEP   traMADol (ULTRAM) 50 MG tablet TAKE TWO TABLETS BY MOUTH three times daily AS NEEDED FOR moderate pain   triamcinolone cream (KENALOG) 0.1 % Apply 1 application topically 2 (two) times daily.   venlafaxine XR (EFFEXOR-XR) 150 MG 24 hr capsule Take 1 capsule (150 mg total) by mouth every morning.   vitamin B-12 (CYANOCOBALAMIN) 1000 MCG tablet Take 1,000 mcg by mouth daily. 2500 mcg once daily   vitamin E 180 MG (400 UNITS) capsule Take 400 Units by mouth daily.   [DISCONTINUED] potassium chloride (KLOR-CON 10) 10 MEQ CR tablet Take 1 tablet (10 mEq total) by mouth daily.   No facility-administered encounter medications on file as of 07/09/2021.    Allergies (verified) Albuterol; Antihistamines, loratadine-type; Bee venom; Clindamycin/lincomycin; Codeine; Doxycycline; Lisinopril; Metronidazole; Ondansetron hcl; Oxycodone-acetaminophen; Tylox [oxycodone-acetaminophen]; Vancomycin; Benadryl [diphenhydramine]; and Lidocaine   History: Past Medical History:  Diagnosis Date   Allergy    Angioedema    Asthma    Blood transfusion without reported diagnosis    with both  vaginal deliveries bleed out and had blood   DDD (degenerative disc disease), lumbar    Diabetes mellitus    Eczema    GERD (gastroesophageal reflux disease)    Hx of colonic polyps    Hyperlipidemia    Hypertension    Interstitial cystitis    per Dr. Risa Grill   Low back pain    Meningitis    Seizures ([place])    seizures with brain tumor-no sz since 1984   Thyroid disease    Past Surgical History:  Procedure Laterality Date   ABDOMINAL HYSTERECTOMY     BRAIN TUMOR EXCISION     benign 1984   CHOLECYSTECTOMY     COLONOSCOPY  05/02/2015   per Dr. Silverio Decamp, internal hemorrhoids but no polyps, repeat in 10 yrs    cystic mass  4.4.11   remval from abdomen per Dr. Kendell Bane at [facility]  SVD X 2     TONSILLECTOMY     TUBAL LIGATION     bilateral   Family History  Problem Relation Age of Onset   Hypertension Other        family hx   Stroke Mother    Rheum arthritis Mother    COPD Mother    Stroke Father    Colon cancer Father    Colon polyps Neg Hx    Social History   Socioeconomic History   Marital status: Married    Spouse name: Not on file   Number of children: Not on file   Years of education: Not on file   Highest education level: Not on file  Occupational History   Not on file  Tobacco Use   Smoking status: Former   Smokeless tobacco: Never  Substance and Sexual Activity   Alcohol use: No    Alcohol/week: 0.0 standard drinks of alcohol   Drug use: No   Sexual activity: Not on file  Other Topics Concern   Not on file  Social History Narrative   Not on file   Social Determinants of Health   Financial Resource Strain: Low Risk  (07/09/2021)   Overall Financial Resource Strain (CARDIA)    Difficulty of Paying Living Expenses: Not hard at all  Food Insecurity: No Food Insecurity (07/09/2021)   Hunger Vital Sign    Worried About Running Out of Food in the Last Year: Never true    Ran Out of Food in the Last Year: Never true   Transportation Needs: No Transportation Needs (07/09/2021)   PRAPARE - Hydrologist (Medical): No    Lack of Transportation (Non-Medical): No  Physical Activity: Inactive (07/09/2021)   Exercise Vital Sign    Days of Exercise per Week: 0 days    Minutes of Exercise per Session: 0 min  Stress: No Stress Concern Present (07/09/2021)   [place]    Feeling of Stress : Not at all  Social Connections: Moderately Isolated (07/09/2021)   Social Connection and Isolation Panel [NHANES]    Frequency of Communication with Friends and Family: More than three times a week    Frequency of Social Gatherings with Friends and Family: More than three times a week    Attends Religious Services: Never    Marine scientist or Organizations: No    Attends Music therapist: Never    Marital Status: Married    Tobacco Counseling Counseling given: Not Answered   Clinical Intake:  Pre-visit preparation completed: NoNutrition Risk Assessment:  Has the patient had any N/V/D within the last 2 months?  No  Does the patient have any non-healing wounds?  No  Has the patient had any unintentional weight loss or weight gain?  No   Diabetes:  Is the patient diabetic?  Yes  If diabetic, was a CBG obtained today?  Yes  CBG 115 Taken by patient Did the patient bring in their glucometer from home?  No  How often do you monitor your CBG's? Daily.   Financial Strains and Diabetes Management:  Are you having any financial strains with the device, your supplies or your medication? No .  Does the patient want to be seen by Chronic Care Management for management of their diabetes?  No  Would the patient like to be referred to a Nutritionist or for Diabetic Management?  No   Diabetic Exams:  Diabetic Eye Exam: Completed Yes. Overdue for diabetic eye exam. Pt has been advised about the importance in  completing this exam. A referral has been placed today. Message sent to referral coordinator for scheduling purposes. Advised pt to expect a call from office referred to regarding appt.  Diabetic Foot Exam: Completed Yes. Pt has been advised about the importance in completing this exam. Pt is scheduled for diabetic foot exam on Followed by PCP.    Pain : No/denies pain     Nutritional Risks: None Diabetes: Yes CBG done?: Yes CBG resulted in Enter/ Edit results?: Yes (CBG 115 Taken by patient) Did pt. bring in CBG monitor from home?: No  How often do you need to have someone help you when you read instructions, pamphlets, or other written materials from your doctor or pharmacy?: 4 - Often (Husband assist)  Diabetic?  Yes  Interpreter Needed?: No  Information entered by :: Rolene Arbour LPN   Activities of Daily Living    07/09/2021    9:03 AM  In your present state of health, do you have any difficulty performing the following activities:  Hearing? 0  Vision? 0  Difficulty concentrating or making decisions? 0  Walking or climbing stairs? 1  Comment Dx Spinial Stenosis Uses a cane, walker and wheelchair  Dressing or bathing? 1  Comment Husband assist  Doing errands, shopping? 1  Comment Husband and family [place] and eating ? N  Using the Toilet? N  In the past six months, have you accidently leaked urine? Y  Comment Wears pads. Followed by PCP  Do you have problems with loss of bowel control? Y  Comment Wears pads. Followed by PCP  Managing your Medications? N  Comment Husband assist  Managing your Finances? Y  Comment Husband assist  Housekeeping or managing your Housekeeping? Y  Comment Husband and family assist    Patient Care Team: Laurey Morale, MD as PCP - General Kipp Brood Mariam Dollar, [facility] as Pharmacist (Pharmacist)  Indicate any recent Medical Services you may have received from other than [facility] providers in the past year (date may be  approximate).     Assessment:   This is a routine wellness examination for Christina [name].  Hearing/Vision screen Hearing Screening - Comments:: No hearing difficulty Vision Screening - Comments:: Wears glasses. Followed by [place] issues and exercise activities discussed: Exercise limited by: orthopedic condition(s);Other - see comments (Dx Spinal Stenosis)   Goals Addressed               This Visit's Progress     Patient stated (pt-stated)        I want to feel and do better with health.       Depression Screen    07/09/2021    9:00 AM 11/16/2020    3:29 PM 06/14/2020    1:37 PM 06/14/2020    1:35 PM 07/06/2019    3:07 PM 05/25/2014    1:13 PM  PHQ 2/9 Scores  PHQ - 2 Score 0 5 0 0 4 0  PHQ- 9 Score  15   14     Fall Risk    07/09/2021    9:10 AM 11/16/2020    1:12 PM 06/14/2020    1:36 PM 05/25/2014    1:13 PM  Fall Risk   Falls in the past year? 1 1 0 No  Number falls in past yr: 0 0 0   Injury with Fall? 1 1  0   Comment Bruised left  knee and Toe. Followed by Medicial attention Knee pain    Risk for fall due to : Other (Comment) History of fall(s) Impaired balance/gait;Impaired mobility   Risk for fall due to: Comment Dx Spinal Stenosis. Followed by PCP     Follow up   Falls evaluation completed     FALL RISK PREVENTION PERTAINING TO THE HOME:  Any stairs in or around the home? Yes  If so, are there any without handrails? No  Home free of loose throw rugs in walkways, pet beds, electrical cords, etc? Yes  Adequate lighting in your home to reduce risk of falls? Yes   ASSISTIVE DEVICES UTILIZED TO PREVENT FALLS:  Life alert? No  Use of a cane, walker or w/c? Yes  Grab bars in the bathroom? No  Shower chair or bench in shower? Yes  Elevated toilet seat or a handicapped toilet? No   TIMED UP AND GO:  Was the test performed? No . Audio Visit  Cognitive Function:   Immunizations Immunization History  Administered Date(s) Administered    Influenza Whole 12/27/2004, 11/27/2008   Influenza,inj,Quad PF,6+ Mos 10/08/2012, 10/05/2013, 10/06/2014, 01/04/2016, 10/29/2016, 12/23/2017, 11/30/2018, 11/07/2019, 11/16/2020   PFIZER(Purple Top)SARS-COV-2 Vaccination 05/20/2019, 06/15/2019   Pneumococcal Polysaccharide-23 01/16/2020   Tdap 04/01/2017    TDAP status: Up to date  Flu Vaccine status: Up to date   Covid-19 vaccine status: Completed vaccines  Qualifies for Shingles Vaccine? Yes   Zostavax completed No   Shingrix Completed?: No.    Education has been provided regarding the importance of this vaccine. Patient has been advised to call insurance company to determine out of pocket expense if they have not yet received this vaccine. Advised may also receive vaccine at local pharmacy or Health Dept. Verbalized acceptance and understanding.  Screening Tests Health Maintenance  Topic Date Due   FOOT EXAM  Never done   OPHTHALMOLOGY EXAM  Never done   PAP SMEAR-Modifier  Never done   HEMOGLOBIN A1C  05/17/2021   COVID-19 Vaccine (3 - Pfizer series) 07/25/2021 (Originally 08/10/2019)   URINE MICROALBUMIN  08/08/2021 (Originally 02/22/2015)   Zoster Vaccines- Shingrix (1 of 2) 10/09/2021 (Originally 08/28/2014)   MAMMOGRAM  07/10/2022 (Originally 07/05/2021)   Hepatitis C Screening  07/10/2022 (Originally [birth year]/1982)   INFLUENZA VACCINE  08/27/2021   COLONOSCOPY (Pts 45-35yr Insurance coverage will need to be confirmed)  05/01/2025   TETANUS/TDAP  04/02/2027   HIV Screening  Completed   HPV VACCINES  Aged Out    Health Maintenance  Health Maintenance Due  Topic Date Due   FOOT EXAM  Never done   OPHTHALMOLOGY EXAM  Never done   PAP SMEAR-Modifier  Never done   HEMOGLOBIN A1C  05/17/2021    Colorectal cancer screening: Type of screening: Colonoscopy. Completed 05/02/15. Repeat every 10 years  Mammogram status: Completed 07/05/20. Repeat every year    Lung Cancer Screening: (Low Dose CT Chest recommended if Age 57-80years,  30 pack-year currently smoking OR have quit w/in 15years.) does not qualify.     Additional Screening:  Hepatitis C Screening: does qualify; Completed Patient deferred  Vision Screening: Recommended annual ophthalmology exams for early detection of glaucoma and other disorders of the eye. Is the patient up to date with their annual eye exam?  Yes  Who is the provider or what is the name of the office in which the patient attends annual eye exams? [facility] pt is not established with a  provider, would they like to be referred to a provider to establish care? No .   Dental Screening: Recommended annual dental exams for proper oral hygiene  Community Resource Referral / Chronic Care Management:  CRR required this visit?  No   CCM required this visit?  No      Plan:     I have personally reviewed and noted the following in the patient's chart:   Medical and social history Use of alcohol, tobacco or illicit drugs  Patient currently taken opioids. Current medications and supplements including opioid prescriptions.  Functional ability and status Nutritional status Physical activity Advanced directives List of other physicians Hospitalizations, surgeries, and ER visits in previous 12 months Vitals Screenings to include cognitive, depression, and falls Referrals and appointments  In addition, I have reviewed and discussed with patient certain preventive protocols, quality metrics, and best practice recommendations. A written personalized care plan for preventive services as well as general preventive health recommendations were provided to patient.     Criselda Peaches, LPN   [id number]   Nurse Notes: Patient due labs, Urine Microalbumin and Hemoglobin A1C.Patient has scheduled office visit 07/12/21 @ 8:30a

## 2021-07-09 NOTE — Patient Instructions (Addendum)
Christina [name] , Thank you for taking time to come for your Medicare Wellness Visit. I appreciate your ongoing commitment to your health goals. Please review the following plan we discussed and let me know if I can assist you in the future.   These are the goals we discussed:  Goals       Patient stated (pt-stated)      I want to feel and do better with health.      Weight Loss Achieved      Evidence-based guidance:  Review medication that may contribute to weight gain, such as corticosteroid, beta-blocker, tricyclic antidepressant, oral antihyperglycemic; advocate for changes when appropriate.  Perform or refer to registered dietitian to perform comprehensive nutrition assessment that includes disordered-eating behaviors, such as binge-eating, emotional or compulsive eating, grazing.  Counsel patient regarding health risks of obesity and that weight loss goal of 5 to 10 percent of initial weight will improve risk.  Recommend initial weight loss goal of 3 to 5 percent of bodyweight; increase weight-loss goals based on patient success as achieving greater weight loss continues to reduce risk.  Propose a calorie-reduced diet based on the patient's preferences and health status.  Provide ongoing emotional support or cognitive behavioral therapy and dietitian services (individual, group, virtual) over at least 6 months with a minimum of 14 encounters to best facilitate weight loss.  Provide monthly follow-up for 12 months when weight loss goal is met to assist with maintenance of weight loss.  Encourage increased physical activity or exercise based on individual age, risk, and ability up to 200 to 300 minutes per week that includes aerobic and resistance training.  Encourage reduction in sedentary behaviors by replacing them with nonexercise yet active leisure pursuits.  Identify physical barriers, such as change in posture, balance, gait patterns, joint pain, and environmental barriers to activity.   Consider referral to rehabilitation therapy, especially when mobility or function is impaired due to osteoarthritis and obesity.  Consider referral to weight-loss program that has published evidence of safety and efficacy if on-site intensive intervention is unavailable or patient preference.  Prepare patient for use of pharmacologic therapy as an adjunct to lifestyle changes based on body mass index, patient agreement and presence of risk factors or comorbidities.  Evaluate efficacy of pharmacologic therapy (weight loss) and tolerance to medication periodically.  Engage in shared decision-making regarding referral to bariatric surgeon for consultation and evaluation when weight-loss goal has not been accomplished by behavioral therapy with or without pharmacologic therapy.   Notes:         This is a list of the screening recommended for you and due dates:  Health Maintenance  Topic Date Due   Complete foot exam   Never done   Eye exam for diabetics  Never done   Pap Smear  Never done   Hemoglobin A1C  05/17/2021   COVID-19 Vaccine (3 - Pfizer series) 07/25/2021*   Urine Protein Check  08/08/2021*   Zoster (Shingles) Vaccine (1 of 2) 10/09/2021*   Mammogram  07/10/2022*   Hepatitis C Screening: USPSTF Recommendation to screen - Ages 18-79 yo.  07/10/2022*   Flu Shot  08/27/2021   Colon Cancer Screening  05/01/2025   Tetanus Vaccine  04/02/2027   HIV Screening  Completed   HPV Vaccine  Aged Out  *Topic was postponed. The date shown is not the original due date.   Opioid Pain Medicine Management Opioids are powerful medicines that are used to treat moderate to severe pain. When  used for short periods of time, they can help you to: Sleep better. Do better in physical or occupational therapy. Feel better in the first few days after an injury. Recover from surgery. Opioids should be taken with the supervision of a trained health care provider. They should be taken for the shortest  period of time possible. This is because opioids can be addictive, and the longer you take opioids, the greater your risk of addiction. This addiction can also be called opioid use disorder. What are the risks? Using opioid pain medicines for longer than 3 days increases your risk of side effects. Side effects include: Constipation. Nausea and vomiting. Breathing difficulties (respiratory depression). Drowsiness. Confusion. Opioid use disorder. Itching. Taking opioid pain medicine for a long period of time can affect your ability to do daily tasks. It also puts you at risk for: Motor vehicle crashes. Depression. Suicide. Heart attack. Overdose, which can be life-threatening. What is a pain treatment plan? A pain treatment plan is an agreement between you and your health care provider. Pain is unique to each person, and treatments vary depending on your condition. To manage your pain, you and your health care provider need to work together. To help you do this: Discuss the goals of your treatment, including how much pain you might expect to have and how you will manage the pain. Review the risks and benefits of taking opioid medicines. Remember that a good treatment plan uses more than one approach and minimizes the chance of side effects. Be honest about the amount of medicines you take and about any drug or alcohol use. Get pain medicine prescriptions from only one health care provider. Pain can be managed with many types of alternative treatments. Ask your health care provider to refer you to one or more specialists who can help you manage pain through: Physical or occupational therapy. Counseling (cognitive behavioral therapy). Good nutrition. Biofeedback. Massage. Meditation. Non-opioid medicine. Following a gentle exercise program. How to use opioid pain medicine Taking medicine Take your pain medicine exactly as told by your health care provider. Take it only when you need  it. If your pain gets less severe, you may take less than your prescribed dose if your health care provider approves. If you are not having pain, do nottake pain medicine unless your health care provider tells you to take it. If your pain is severe, do nottry to treat it yourself by taking more pills than instructed on your prescription. Contact your health care provider for help. Write down the times when you take your pain medicine. It is easy to become confused while on pain medicine. Writing the time can help you avoid overdose. Take other over-the-counter or prescription medicines only as told by your health care provider. Keeping yourself and others safe  While you are taking opioid pain medicine: Do not drive, use machinery, or power tools. Do not sign legal documents. Do not drink alcohol. Do not take sleeping pills. Do not supervise children by yourself. Do not do activities that require climbing or being in high places. Do not go to a lake, river, ocean, spa, or swimming pool. Do not share your pain medicine with anyone. Keep pain medicine in a locked cabinet or in a secure area where pets and children cannot reach it. Stopping your use of opioids If you have been taking opioid medicine for more than a few weeks, you may need to slowly decrease (taper) how much you take until you stop completely. Tapering your  use of opioids can decrease your risk of symptoms of withdrawal, such as: Pain and cramping in the abdomen. Nausea. Sweating. Sleepiness. Restlessness. Uncontrollable shaking (tremors). Cravings for the medicine. Do not attempt to taper your use of opioids on your own. Talk with your health care provider about how to do this. Your health care provider may prescribe a step-down schedule based on how much medicine you are taking and how long you have been taking it. Getting rid of leftover pills Do not save any leftover pills. Get rid of leftover pills safely by: Taking  the medicine to a prescription take-back program. This is usually offered by the county or law enforcement. Bringing them to a pharmacy that has a drug disposal container. Flushing them down the toilet. Check the label or package insert of your medicine to see whether this is safe to do. Throwing them out in the trash. Check the label or package insert of your medicine to see whether this is safe to do. If it is safe to throw it out, remove the medicine from the original container, put it into a sealable bag or container, and mix it with used coffee grounds, food scraps, dirt, or cat litter before putting it in the trash. Follow these instructions at home: Activity Do exercises as told by your health care provider. Avoid activities that make your pain worse. Return to your normal activities as told by your health care provider. Ask your health care provider what activities are safe for you. General instructions You may need to take these actions to prevent or treat constipation: Drink enough fluid to keep your urine pale yellow. Take over-the-counter or prescription medicines. Eat foods that are high in fiber, such as beans, whole grains, and fresh fruits and vegetables. Limit foods that are high in fat and processed sugars, such as fried or sweet foods. Keep all follow-up visits. This is important. Where to find support If you have been taking opioids for a long time, you may benefit from receiving support for quitting from a local support group or counselor. Ask your health care provider for a referral to these resources in your area. Where to find more information Centers for Disease Control and Prevention (CDC): http://www.wolf.info/ [place] Food and Drug Administration (FDA): [place] Get help right away if: You may have taken too much of an opioid (overdosed). Common symptoms of an overdose: Your breathing is slower or more shallow than normal. You have a very slow heartbeat (pulse). You have  slurred speech. You have nausea and vomiting. Your pupils become very small. You have other potential symptoms: You are very confused. You faint or feel like you will faint. You have cold, clammy skin. You have blue lips or fingernails. You have thoughts of harming yourself or harming others. These symptoms may represent a serious problem that is an emergency. Do not wait to see if the symptoms will go away. Get medical help right away. Call your local emergency services (911 in the [place] Do not drive yourself to the hospital.  If you ever feel like you may hurt yourself or others, or have thoughts about taking your own life, get help right away. Go to your nearest emergency department or: Call your local emergency services (911 in the [place] Call the [facility] 458-492-6376 in the [place] Call a suicide crisis helpline, such as the [place] at 620-353-1923 or 988 in the [place]. This is open 24 hours a day in the [place] Text  the Crisis Text Line at (812)751-9304 (in the [place].). Summary Opioid medicines can help you manage moderate to severe pain for a short period of time. A pain treatment plan is an agreement between you and your health care provider. Discuss the goals of your treatment, including how much pain you might expect to have and how you will manage the pain. If you think that you or someone else may have taken too much of an opioid, get medical help right away. This information is not intended to replace advice given to you by your health care provider. Make sure you discuss any questions you have with your health care provider. Document Revised: 08/08/2020 Document Reviewed: 04/25/2020 Elsevier Patient Education  [place] directives: Yes  Conditions/risks identified: None  Next appointment: Follow up in one year for your annual wellness visit.    Preventive Care 40-64 Years, Female Preventive care refers to  lifestyle choices and visits with your health care provider that can promote health and wellness. What does preventive care include? A yearly physical exam. This is also called an annual well check. Dental exams once or twice a year. Routine eye exams. Ask your health care provider how often you should have your eyes checked. Personal lifestyle choices, including: Daily care of your teeth and gums. Regular physical activity. Eating a healthy diet. Avoiding tobacco and drug use. Limiting alcohol use. Practicing safe sex. Taking low-dose aspirin daily starting at age 72. Taking vitamin and mineral supplements as recommended by your health care provider. What happens during an annual well check? The services and screenings done by your health care provider during your annual well check will depend on your age, overall health, lifestyle risk factors, and family history of disease. Counseling  Your health care provider may ask you questions about your: Alcohol use. Tobacco use. Drug use. Emotional well-being. Home and relationship well-being. Sexual activity. Eating habits. Work and work Statistician. Method of birth control. Menstrual cycle. Pregnancy history. Screening  You may have the following tests or measurements: Height, weight, and BMI. Blood pressure. Lipid and cholesterol levels. These may be checked every 5 years, or more frequently if you are over 42 years old. Skin check. Lung cancer screening. You may have this screening every year starting at age 42 if you have a 30-pack-year history of smoking and currently smoke or have quit within the past 15 years. Fecal occult blood test (FOBT) of the stool. You may have this test every year starting at age 51. Flexible sigmoidoscopy or colonoscopy. You may have a sigmoidoscopy every 5 years or a colonoscopy every 10 years starting at age 76. Hepatitis C blood test. Hepatitis B blood test. Sexually transmitted disease (STD)  testing. Diabetes screening. This is done by checking your blood sugar (glucose) after you have not eaten for a while (fasting). You may have this done every 1-3 years. Mammogram. This may be done every 1-2 years. Talk to your health care provider about when you should start having regular mammograms. This may depend on whether you have a family history of breast cancer. BRCA-related cancer screening. This may be done if you have a family history of breast, ovarian, tubal, or peritoneal cancers. Pelvic exam and Pap test. This may be done every 3 years starting at age 17. Starting at age 76, this may be done every 5 years if you have a Pap test in combination with an HPV test. Bone density scan. This is done to screen for  osteoporosis. You may have this scan if you are at high risk for osteoporosis. Discuss your test results, treatment options, and if necessary, the need for more tests with your health care provider. Vaccines  Your health care provider may recommend certain vaccines, such as: Influenza vaccine. This is recommended every year. Tetanus, diphtheria, and acellular pertussis (Tdap, Td) vaccine. You may need a Td booster every 10 years. Zoster vaccine. You may need this after age 86. Pneumococcal 13-valent conjugate (PCV13) vaccine. You may need this if you have certain conditions and were not previously vaccinated. Pneumococcal polysaccharide (PPSV23) vaccine. You may need one or two doses if you smoke cigarettes or if you have certain conditions. Talk to your health care provider about which screenings and vaccines you need and how often you need them. This information is not intended to replace advice given to you by your health care provider. Make sure you discuss any questions you have with your health care provider. Document Released: 02/09/2015 Document Revised: 10/03/2015 Document Reviewed: 11/14/2014 Elsevier Interactive Patient Education  2017 [place]  Prevention in the Home Falls can cause injuries. They can happen to people of all ages. There are many things you can do to make your home safe and to help prevent falls. What can I do on the outside of my home? Regularly fix the edges of walkways and driveways and fix any cracks. Remove anything that might make you trip as you walk through a door, such as a raised step or threshold. Trim any bushes or trees on the path to your home. Use bright outdoor lighting. Clear any walking paths of anything that might make someone trip, such as rocks or tools. Regularly check to see if handrails are loose or broken. Make sure that both sides of any steps have handrails. Any raised decks and porches should have guardrails on the edges. Have any leaves, snow, or ice cleared regularly. Use sand or salt on walking paths during winter. Clean up any spills in your garage right away. This includes oil or grease spills. What can I do in the bathroom? Use night lights. Install grab bars by the toilet and in the tub and shower. Do not use towel bars as grab bars. Use non-skid mats or decals in the tub or shower. If you need to sit down in the shower, use a plastic, non-slip stool. Keep the floor dry. Clean up any water that spills on the floor as soon as it happens. Remove soap buildup in the tub or shower regularly. Attach bath mats securely with double-sided non-slip rug tape. Do not have throw rugs and other things on the floor that can make you trip. What can I do in the bedroom? Use night lights. Make sure that you have a light by your bed that is easy to reach. Do not use any sheets or blankets that are too big for your bed. They should not hang down onto the floor. Have a firm chair that has side arms. You can use this for support while you get dressed. Do not have throw rugs and other things on the floor that can make you trip. What can I do in the kitchen? Clean up any spills right away. Avoid  walking on wet floors. Keep items that you use a lot in easy-to-reach places. If you need to reach something above you, use a strong step stool that has a grab bar. Keep electrical cords out of the way. Do not  use floor polish or wax that makes floors slippery. If you must use wax, use non-skid floor wax. Do not have throw rugs and other things on the floor that can make you trip. What can I do with my stairs? Do not leave any items on the stairs. Make sure that there are handrails on both sides of the stairs and use them. Fix handrails that are broken or loose. Make sure that handrails are as long as the stairways. Check any carpeting to make sure that it is firmly attached to the stairs. Fix any carpet that is loose or worn. Avoid having throw rugs at the top or bottom of the stairs. If you do have throw rugs, attach them to the floor with carpet tape. Make sure that you have a light switch at the top of the stairs and the bottom of the stairs. If you do not have them, ask someone to add them for you. What else can I do to help prevent falls? Wear shoes that: Do not have high heels. Have rubber bottoms. Are comfortable and fit you well. Are closed at the toe. Do not wear sandals. If you use a stepladder: Make sure that it is fully opened. Do not climb a closed stepladder. Make sure that both sides of the stepladder are locked into place. Ask someone to hold it for you, if possible. Clearly mark and make sure that you can see: Any grab bars or handrails. First and last steps. Where the edge of each step is. Use tools that help you move around (mobility aids) if they are needed. These include: Canes. Walkers. Scooters. Crutches. Turn on the lights when you go into a dark area. Replace any light bulbs as soon as they burn out. Set up your furniture so you have a clear path. Avoid moving your furniture around. If any of your floors are uneven, fix them. If there are any pets around  you, be aware of where they are. Review your medicines with your doctor. Some medicines can make you feel dizzy. This can increase your chance of falling. Ask your doctor what other things that you can do to help prevent falls. This information is not intended to replace advice given to you by your health care provider. Make sure you discuss any questions you have with your health care provider. Document Released: 11/09/2008 Document Revised: 06/21/2015 Document Reviewed: 02/17/2014 Elsevier Interactive Patient Education  2017 [employer].

## 2021-07-12 ENCOUNTER — Ambulatory Visit (INDEPENDENT_AMBULATORY_CARE_PROVIDER_SITE_OTHER): Payer: PPO | Admitting: Family Medicine

## 2021-07-12 ENCOUNTER — Encounter: Payer: Self-pay | Admitting: Family Medicine

## 2021-07-12 VITALS — BP 120/72 | HR 69 | Temp 98.5°F | Wt 213.0 lb

## 2021-07-12 DIAGNOSIS — R21 Rash and other nonspecific skin eruption: Secondary | ICD-10-CM

## 2021-07-12 DIAGNOSIS — J4520 Mild intermittent asthma, uncomplicated: Secondary | ICD-10-CM | POA: Diagnosis not present

## 2021-07-12 DIAGNOSIS — N1832 Chronic kidney disease, stage 3b: Secondary | ICD-10-CM

## 2021-07-12 DIAGNOSIS — E65 Localized adiposity: Secondary | ICD-10-CM

## 2021-07-12 DIAGNOSIS — F418 Other specified anxiety disorders: Secondary | ICD-10-CM | POA: Diagnosis not present

## 2021-07-12 DIAGNOSIS — E1169 Type 2 diabetes mellitus with other specified complication: Secondary | ICD-10-CM

## 2021-07-12 DIAGNOSIS — I10 Essential (primary) hypertension: Secondary | ICD-10-CM | POA: Diagnosis not present

## 2021-07-12 LAB — LIPID PANEL
Cholesterol: 166 mg/dL (ref 0–200)
HDL: 48.5 mg/dL (ref >39.00)
NonHDL: 117.65
Total CHOL/HDL Ratio: 3
Triglycerides: 305 mg/dL — ABNORMAL HIGH (ref 0.0–149.0)
VLDL: 61 mg/dL — ABNORMAL HIGH (ref 0.0–40.0)

## 2021-07-12 LAB — LDL CHOLESTEROL, DIRECT: Direct LDL: 87 mg/dL

## 2021-07-12 LAB — HEPATIC FUNCTION PANEL
ALT: 19 U/L (ref 0–35)
AST: 17 U/L (ref 0–37)
Albumin: 4.3 g/dL (ref 3.5–5.2)
Alkaline Phosphatase: 34 U/L — ABNORMAL LOW (ref 39–117)
Bilirubin, Direct: 0.1 mg/dL (ref 0.0–0.3)
Total Bilirubin: 0.3 mg/dL (ref 0.2–1.2)
Total Protein: 7 g/dL (ref 6.0–8.3)

## 2021-07-12 LAB — CBC WITH DIFFERENTIAL/PLATELET
Basophils Absolute: 0 10*3/uL (ref 0.0–0.1)
Basophils Relative: 0.6 % (ref 0.0–3.0)
Eosinophils Absolute: 0.1 10*3/uL (ref 0.0–0.7)
Eosinophils Relative: 1.2 % (ref 0.0–5.0)
HCT: 38.7 % (ref 36.0–46.0)
Hemoglobin: 12.7 g/dL (ref 12.0–15.0)
Lymphocytes Relative: 34.4 % (ref 12.0–46.0)
Lymphs Abs: 2 10*3/uL (ref 0.7–4.0)
MCHC: 32.9 g/dL (ref 30.0–36.0)
MCV: 90.8 fl (ref 78.0–100.0)
Monocytes Absolute: 0.4 10*3/uL (ref 0.1–1.0)
Monocytes Relative: 6.3 % (ref 3.0–12.0)
Neutro Abs: 3.3 10*3/uL (ref 1.4–7.7)
Neutrophils Relative %: 57.5 % (ref 43.0–77.0)
Platelets: 199 10*3/uL (ref 150.0–400.0)
RBC: 4.27 Mil/uL (ref 3.87–5.11)
RDW: 14.1 % (ref 11.5–15.5)
WBC: 5.8 10*3/uL (ref 4.0–10.5)

## 2021-07-12 LAB — BASIC METABOLIC PANEL
BUN: 27 mg/dL — ABNORMAL HIGH (ref 6–23)
CO2: 29 mEq/L (ref 19–32)
Calcium: 9.3 mg/dL (ref 8.4–10.5)
Chloride: 103 mEq/L (ref 96–112)
Creatinine, Ser: 1.36 mg/dL — ABNORMAL HIGH (ref 0.40–1.20)
GFR: 43.43 mL/min — ABNORMAL LOW (ref >60.00)
Glucose, Bld: 88 mg/dL (ref 70–99)
Potassium: 4.7 mEq/L (ref 3.5–5.1)
Sodium: 140 mEq/L (ref 135–145)

## 2021-07-12 LAB — HEMOGLOBIN A1C: Hgb A1c MFr Bld: 6 % (ref 4.6–6.5)

## 2021-07-12 NOTE — Progress Notes (Signed)
   Subjective:    Patient ID: Christina [name], female    DOB: October 05, 1965, 56 y.o.   MRN: [id number]  HPI Here to follow up on diabetes, HTN, and other issues. She feels well in general. She is fasting for lab work. She has recurrent rashes around the face and scalp, and she has used Triamcinolone cream for these in the past. She has always had mixed results. Also she wants to see someone about the possibility of surgically reducing the size of her abdomen.    Review of Systems  Constitutional: Negative.   Respiratory: Negative.    Cardiovascular: Negative.        Objective:   Physical Exam Constitutional:      Appearance: She is obese.     Comments: In her wheelchair   Cardiovascular:     Rate and Rhythm: Normal rate and regular rhythm.     Pulses: Normal pulses.     Heart sounds: Normal heart sounds.  Pulmonary:     Effort: Pulmonary effort is normal.     Breath sounds: Normal breath sounds.  Neurological:     Mental Status: She is alert.           Assessment & Plan:  Her HTN and asthma are stable. We will get fasting labs to check A1c, etc. Refer to Dermatology for the facial rashes. Refer to Plastic Surgery to discuss options like abdominoplasty . Alysia Penna, MD

## 2021-07-15 DIAGNOSIS — N1832 Chronic kidney disease, stage 3b: Secondary | ICD-10-CM | POA: Insufficient documentation

## 2021-07-15 NOTE — Addendum Note (Signed)
Addended by: Alysia Penna A on: 07/15/2021 07:45 AM   Modules accepted: Orders

## 2021-07-16 NOTE — Addendum Note (Signed)
Addended by: Alysia Penna A on: 07/16/2021 01:08 PM   Modules accepted: Orders

## 2021-07-24 ENCOUNTER — Encounter: Payer: Self-pay | Admitting: Family Medicine

## 2021-07-26 ENCOUNTER — Telehealth: Payer: Self-pay | Admitting: Pharmacist

## 2021-07-26 NOTE — Chronic Care Management (AMB) (Signed)
Chronic Care Management Pharmacy Assistant   Name: Christina [name]  MRN: [id number] DOB: 1965-03-31  Reason for Encounter: Medication Review / Monthly Coordination Call  Recent office visits:  07/12/2021 Alysia Penna MD - Patient was seen for Type 2 diabetes mellitus with other specified complication, without long-term current use of insulin and additional issues. Discontinued Keflex. No follow up noted.   07/09/2021 Rolene Arbour LPN - Encounter for Medicare annual wellness exam  Recent consult visits:  None  Hospital visits:  None  Medications: Outpatient Encounter Medications as of 07/26/2021  Medication Sig   aspirin 81 MG EC tablet Take 81 mg by mouth daily.   atorvastatin (LIPITOR) 10 MG tablet TAKE ONE TABLET BY MOUTH ONCE DAILY   celecoxib (CELEBREX) 200 MG capsule TAKE ONE CAPSULE BY MOUTH twice A DAY AS NEEDED   cholecalciferol (VITAMIN D) 1000 UNITS tablet Take 1,000 Units by mouth daily.   Cinnamon 500 MG TABS Take 2 tablets by mouth daily.   clobetasol (TEMOVATE) 0.05 % external solution Apply 1 application topically 2 (two) times daily.   Continuous Blood Gluc Receiver (FREESTYLE LIBRE 14 DAY READER) DEVI 1 application by Does not apply route every 14 (fourteen) days.   Continuous Blood Gluc Sensor (FREESTYLE LIBRE 2 SENSOR) MISC USE TO check blood glucose AS DIRECTED AND CHANGE sensor every 14 DAYS   [employer] ULTRA THIN LANCETS MISC 1 strip by Does not apply route as directed.   cyclobenzaprine (FLEXERIL) 10 MG tablet TAKE ONE TABLET BY MOUTH THREE TIMES DAILY AS NEEDED FOR muscle SPASMS   desonide (DESOWEN) 0.05 % cream Apply topically 2 (two) times daily.   diclofenac (VOLTAREN) 75 MG EC tablet TAKE ONE TABLET BY MOUTH TWICE DAILY   diltiazem (CARDIZEM CD) 360 MG 24 hr capsule TAKE ONE CAPSULE BY MOUTH DAILY   diphenhydrAMINE (BENADRYL) 25 mg capsule Take 25 mg by mouth as needed.   EPINEPHrine 0.3 mg/0.3 mL IJ SOAJ injection Inject 0.3 mg into the muscle as needed for  anaphylaxis.   fenofibrate 160 MG tablet Take 1 tablet (160 mg total) by mouth daily.   gabapentin (NEURONTIN) 300 MG capsule TAKE TWO CAPSULES BY MOUTH THREE TIMES DAILY   glucose blood (TRUETEST TEST) test strip Use as instructed   HYDROmorphone (DILAUDID) 4 MG tablet Take 1 tablet (4 mg total) by mouth every 6 (six) hours as needed for severe pain.   levothyroxine (SYNTHROID) 50 MCG tablet TAKE ONE TABLET BY MOUTH EVERY MORNING   metFORMIN (GLUCOPHAGE-XR) 500 MG 24 hr tablet TAKE 1 TABLET BY MOUTH TWICE A DAY WITH MEAL   metoprolol succinate (TOPROL-XL) 100 MG 24 hr tablet TAKE ONE TABLET BY MOUTH ONCE DAILY WITH FOOD   mupirocin ointment (BACTROBAN) 2 % Apply topically 3 (three) times daily.   omeprazole (PRILOSEC) 20 MG capsule TAKE ONE CAPSULE BY MOUTH TWICE DAILY   potassium chloride (KLOR-CON M) 10 MEQ tablet Take 1 tablet (10 mEq total) by mouth daily.   promethazine (PHENERGAN) 25 MG tablet Take 1 tablet (25 mg total) by mouth every 6 (six) hours as needed for nausea.   Semaglutide, 2 MG/DOSE, 8 MG/3ML SOPN Inject 2 mg as directed once a week.   spironolactone (ALDACTONE) 25 MG tablet Take 1 tablet (25 mg total) by mouth daily.   sulfamethoxazole-trimethoprim (BACTRIM DS) 800-160 MG tablet Take 1 tablet by mouth 2 (two) times daily.   temazepam (RESTORIL) 30 MG capsule TAKE ONE CAPSULE BY MOUTH EVERYDAY AT BEDTIME AS NEEDED FOR SLEEP  traMADol (ULTRAM) 50 MG tablet TAKE TWO TABLETS BY MOUTH three times daily AS NEEDED FOR moderate pain   triamcinolone cream (KENALOG) 0.1 % Apply 1 application topically 2 (two) times daily.   venlafaxine XR (EFFEXOR-XR) 150 MG 24 hr capsule Take 1 capsule (150 mg total) by mouth every morning.   vitamin B-12 (CYANOCOBALAMIN) 1000 MCG tablet Take 1,000 mcg by mouth daily. 2500 mcg once daily   vitamin E 180 MG (400 UNITS) capsule Take 400 Units by mouth daily.   [DISCONTINUED] potassium chloride (KLOR-CON 10) 10 MEQ CR tablet Take 1 tablet (10 mEq  total) by mouth daily.   No facility-administered encounter medications on file as of 07/26/2021.  Reviewed chart for medication changes ahead of medication coordination call.  No OVs, Consults, or hospital visits since last care coordination call/Pharmacist visit. (If appropriate, list visit date, provider name)  No medication changes indicated OR if recent visit, treatment plan here.  BP Readings from Last 3 Encounters:  07/12/21 120/72  11/16/20 (!) 150/78  02/10/20 (!) 150/90    Lab Results  Component Value Date   HGBA1C 6.0 07/12/2021     Last adherence delivery included: Cyclobenzaprine 10 mg: take 1 tablet three times a day as needed  Temazepam 30 mg 1 capsule at bedtime as needed.  Potassium chloride  10 MEQ: one tablet daily Metformin  500 MG: One tablet twice a day Fenofibrate 160 mg: one tablet daily Bactrim DS take 1 tablet twice daily Venlafaxine XR 150 mg: one capsule every morning Freestyle Libre 14 day sensor Atorvastatin 10 mg 1 tablet daily  Spironolactone 25 mg 1 tablet daily   Medication last filled not due until August: Levothyroxine 50 mcg 1 tablet every morning Metoprolol succinate  100 MG take 1 tablet by mouth daily  Diclofenac 75 mg 1 tablet 2 times daily  Gabapentin 300 mg 2 capsules 3 times daily  Omeprazole 20 mg 1 capsule twice daily   Diltiazem 360 mg 1 tablet daily   Patient declined meds last month: Tramadol 50 mg 2 tablets 3 times daily as needed  Celebrex 200 mg 1 capsule 2 times daily as needed.  Patient takes these are prn and states she has plenty    Patient is due for next adherence delivery on: 08/09/2021   Called patient and reviewed medications and coordinated delivery.   This delivery to include: (30 day supply medications) Temazepam 30 mg 1 capsule at bedtime as needed.  Cyclobenzaprine 10 mg: take 1 tablet three times a day as needed  Tramadol 50 mg 2 tablets 3 times daily as needed Celebrex 200 mg 1 capsule twice daily  as needed   Patient will need a short fill: No short fill needed   Coordinated acute fill : No acute fill needed  Medications declined:  Medication last filled not due until August or September: Levothyroxine 50 mcg 1 tablet every morning (Aug) Metoprolol succinate  100 MG take 1 tablet by mouth daily (Aug) Diclofenac 75 mg 1 tablet 2 times daily (Aug) Gabapentin 300 mg 2 capsules 3 times daily (Aug) Omeprazole 20 mg 1 capsule twice daily (Aug)  Diltiazem 360 mg 1 tablet daily (Aug) Potassium chloride  10 MEQ: one tablet daily (Sept) Metformin  500 MG: One tablet twice a day (Sept) Fenofibrate 160 mg: one tablet daily (Sept) Bactrim DS take 1 tablet twice daily (Sept) Venlafaxine XR 150 mg: one capsule every morning (Sept Freestyle Libre 14 day sensor (Sept) Atorvastatin 10 mg 1 tablet daily (Sept)  Spironolactone 25 mg 1 tablet daily (Sept)   Confirmed delivery date of 08/12/2021 (patient will not be home on 08/09/2021), advised patient that pharmacy will contact them the morning of delivery.     Care Gaps: AWV - scheduled 07/14/2022 Last BP - 120/70 on 07/12/2021 Last A1C - 6.0 on 07/12/2021 Foot Exam - never done Eye exam - never done Pap smear - never done Covid booster - overdue Malb - postponed Shingrix - postponed Mammogram - postponed Hep C Screen - postponed  Star Rating Drugs: Atorvastatin 10 mg - last filled 07/04/2021 90 [facility] Metformin '500mg'$  - last filled 07/04/2021 90 [facility] at [facility] Semaglutide '2mg'$  - no fill history  [place] Pharmacist Assistant [phone]

## 2021-08-09 ENCOUNTER — Ambulatory Visit: Payer: PPO | Admitting: Plastic Surgery

## 2021-08-09 ENCOUNTER — Encounter: Payer: Self-pay | Admitting: Plastic Surgery

## 2021-08-09 VITALS — BP 112/63 | HR 77 | Ht 62.0 in | Wt 214.4 lb

## 2021-08-09 DIAGNOSIS — M5440 Lumbago with sciatica, unspecified side: Secondary | ICD-10-CM | POA: Diagnosis not present

## 2021-08-09 DIAGNOSIS — E1142 Type 2 diabetes mellitus with diabetic polyneuropathy: Secondary | ICD-10-CM | POA: Diagnosis not present

## 2021-08-09 DIAGNOSIS — R2689 Other abnormalities of gait and mobility: Secondary | ICD-10-CM

## 2021-08-09 DIAGNOSIS — M542 Cervicalgia: Secondary | ICD-10-CM | POA: Diagnosis not present

## 2021-08-09 DIAGNOSIS — R140 Abdominal distension (gaseous): Secondary | ICD-10-CM

## 2021-08-09 DIAGNOSIS — F418 Other specified anxiety disorders: Secondary | ICD-10-CM

## 2021-08-09 DIAGNOSIS — Z6838 Body mass index (BMI) 38.0-38.9, adult: Secondary | ICD-10-CM | POA: Diagnosis not present

## 2021-08-09 DIAGNOSIS — R21 Rash and other nonspecific skin eruption: Secondary | ICD-10-CM | POA: Diagnosis not present

## 2021-08-09 DIAGNOSIS — R32 Unspecified urinary incontinence: Secondary | ICD-10-CM

## 2021-08-09 DIAGNOSIS — E538 Deficiency of other specified B group vitamins: Secondary | ICD-10-CM

## 2021-08-09 DIAGNOSIS — G8929 Other chronic pain: Secondary | ICD-10-CM

## 2021-08-09 DIAGNOSIS — E038 Other specified hypothyroidism: Secondary | ICD-10-CM

## 2021-08-09 NOTE — Progress Notes (Signed)
Patient ID: Christina [name], female    DOB: [birth year], 56 y.o.   MRN: [id number]   Chief Complaint  Patient presents with   Advice Only   Skin Problem    The patient is a 56 year old female here for evaluation of her abdomen.  She is 5 feet 2 inches tall and weighs 214 pounds.  Her body mass index is 38.9.  She is requesting a panniculectomy.  On exam she has very skinny arms and legs.  Her weight seems to be all in the abdomen.  The pannus is extremely heavy I could not even lift it myself.  The abdomen is tight.  She does have rashes.  She has a medical history of thyroid disease and diabetes.  She has degenerative disc disease.  She has had a hysterectomy.  Patient states she is in need of a back surgery.  She feels like the pannus is the cause of her inability to walk will.  Right now she is walking with assistance but spends most of the time in her wheelchair.    Review of Systems  Constitutional: Negative.   Eyes: Negative.   Respiratory: Negative.  Negative for chest tightness.   Cardiovascular: Negative.  Negative for leg swelling.  Gastrointestinal:  Positive for abdominal distention.  Endocrine: Negative.   Genitourinary:  Positive for urgency.  Musculoskeletal:  Positive for back pain and neck pain.  Skin:  Positive for rash.  Neurological: Negative.   Hematological: Negative.   Psychiatric/Behavioral: Negative.      Past Medical History:  Diagnosis Date   Allergy    Angioedema    Asthma    Blood transfusion without reported diagnosis    with both vaginal deliveries bleed out and had blood   DDD (degenerative disc disease), lumbar    Diabetes mellitus    Eczema    GERD (gastroesophageal reflux disease)    Hx of colonic polyps    Hyperlipidemia    Hypertension    Interstitial cystitis    per Dr. Risa Grill   Low back pain    Meningitis    Seizures ([place])    seizures with brain tumor-no sz since 1984   Thyroid disease     Past Surgical History:  Procedure  Laterality Date   ABDOMINAL HYSTERECTOMY     BRAIN TUMOR EXCISION     benign 1984   CHOLECYSTECTOMY     COLONOSCOPY  05/02/2015   per Dr. Silverio Decamp, internal hemorrhoids but no polyps, repeat in 10 yrs    cystic mass  4.4.11   remval from abdomen per Dr. Kendell Bane at [facility]   POLYPECTOMY     SVD X 2     TONSILLECTOMY     TUBAL LIGATION     bilateral      Current Outpatient Medications:    aspirin 81 MG EC tablet, Take 81 mg by mouth daily., Disp: , Rfl:    atorvastatin (LIPITOR) 10 MG tablet, TAKE ONE TABLET BY MOUTH ONCE DAILY, Disp: 90 tablet, Rfl: 3   celecoxib (CELEBREX) 200 MG capsule, TAKE ONE CAPSULE BY MOUTH twice A DAY AS NEEDED, Disp: 60 capsule, Rfl: 3   cholecalciferol (VITAMIN D) 1000 UNITS tablet, Take 1,000 Units by mouth daily., Disp: , Rfl:    Cinnamon 500 MG TABS, Take 2 tablets by mouth daily., Disp: , Rfl:    clobetasol (TEMOVATE) 0.05 % external solution, Apply 1 application topically 2 (two) times daily., Disp: 50 mL, Rfl: 5  Continuous Blood Gluc Receiver (FREESTYLE LIBRE 14 DAY READER) DEVI, 1 application by Does not apply route every 14 (fourteen) days., Disp: 2 each, Rfl: 11   Continuous Blood Gluc Sensor (FREESTYLE LIBRE 2 SENSOR) MISC, USE TO check blood glucose AS DIRECTED AND CHANGE sensor every 14 DAYS, Disp: 2 each, Rfl: 4   [employer] ULTRA THIN LANCETS MISC, 1 strip by Does not apply route as directed., Disp: 200 each, Rfl: 11   cyclobenzaprine (FLEXERIL) 10 MG tablet, TAKE ONE TABLET BY MOUTH THREE TIMES DAILY AS NEEDED FOR muscle SPASMS, Disp: 90 tablet, Rfl: 4   desonide (DESOWEN) 0.05 % cream, Apply topically 2 (two) times daily., Disp: , Rfl:    diclofenac (VOLTAREN) 75 MG EC tablet, TAKE ONE TABLET BY MOUTH TWICE DAILY, Disp: 180 tablet, Rfl: 3   diltiazem (CARDIZEM CD) 360 MG 24 hr capsule, TAKE ONE CAPSULE BY MOUTH DAILY, Disp: 90 capsule, Rfl: 0   diphenhydrAMINE (BENADRYL) 25 mg capsule, Take 25 mg by mouth as needed., Disp: , Rfl:     EPINEPHrine 0.3 mg/0.3 mL IJ SOAJ injection, Inject 0.3 mg into the muscle as needed for anaphylaxis., Disp: 1 each, Rfl: 2   fenofibrate 160 MG tablet, Take 1 tablet (160 mg total) by mouth daily., Disp: 90 tablet, Rfl: 0   gabapentin (NEURONTIN) 300 MG capsule, TAKE TWO CAPSULES BY MOUTH THREE TIMES DAILY, Disp: 360 capsule, Rfl: 3   glucose blood (TRUETEST TEST) test strip, Use as instructed, Disp: 100 each, Rfl: 1   HYDROmorphone (DILAUDID) 4 MG tablet, Take 1 tablet (4 mg total) by mouth every 6 (six) hours as needed for severe pain., Disp: 120 tablet, Rfl: 0   levothyroxine (SYNTHROID) 50 MCG tablet, TAKE ONE TABLET BY MOUTH EVERY MORNING, Disp: 90 tablet, Rfl: 0   metFORMIN (GLUCOPHAGE-XR) 500 MG 24 hr tablet, TAKE 1 TABLET BY MOUTH TWICE A DAY WITH MEAL, Disp: 180 tablet, Rfl: 3   metoprolol succinate (TOPROL-XL) 100 MG 24 hr tablet, TAKE ONE TABLET BY MOUTH ONCE DAILY WITH FOOD, Disp: 90 tablet, Rfl: 0   mupirocin ointment (BACTROBAN) 2 %, Apply topically 3 (three) times daily., Disp: 15 g, Rfl: 2   omeprazole (PRILOSEC) 20 MG capsule, TAKE ONE CAPSULE BY MOUTH TWICE DAILY, Disp: 180 capsule, Rfl: 3   potassium chloride (KLOR-CON M) 10 MEQ tablet, Take 1 tablet (10 mEq total) by mouth daily., Disp: 90 tablet, Rfl: 0   promethazine (PHENERGAN) 25 MG tablet, Take 1 tablet (25 mg total) by mouth every 6 (six) hours as needed for nausea., Disp: 60 tablet, Rfl: 5   Semaglutide, 2 MG/DOSE, 8 MG/3ML SOPN, Inject 2 mg as directed once a week., Disp: 3 mL, Rfl: 11   spironolactone (ALDACTONE) 25 MG tablet, Take 1 tablet (25 mg total) by mouth daily., Disp: 90 tablet, Rfl: 3   sulfamethoxazole-trimethoprim (BACTRIM DS) 800-160 MG tablet, Take 1 tablet by mouth 2 (two) times daily., Disp: 180 tablet, Rfl: 3   temazepam (RESTORIL) 30 MG capsule, TAKE ONE CAPSULE BY MOUTH EVERYDAY AT BEDTIME AS NEEDED FOR SLEEP, Disp: 30 capsule, Rfl: 5   traMADol (ULTRAM) 50 MG tablet, TAKE TWO TABLETS BY MOUTH three  times daily AS NEEDED FOR moderate pain, Disp: 240 tablet, Rfl: 1   triamcinolone cream (KENALOG) 0.1 %, Apply 1 application topically 2 (two) times daily., Disp: 45 g, Rfl: 5   venlafaxine XR (EFFEXOR-XR) 150 MG 24 hr capsule, Take 1 capsule (150 mg total) by mouth every morning., Disp: 90 capsule, Rfl:  0   vitamin B-12 (CYANOCOBALAMIN) 1000 MCG tablet, Take 1,000 mcg by mouth daily. 2500 mcg once daily, Disp: , Rfl:    vitamin E 180 MG (400 UNITS) capsule, Take 400 Units by mouth daily., Disp: , Rfl:    Objective:   Vitals:   08/09/21 0931  BP: 112/63  Pulse: 77  SpO2: 94%    Physical Exam Vitals and nursing note reviewed.  Constitutional:      Appearance: Normal appearance.  HENT:     Head: Normocephalic.  Cardiovascular:     Rate and Rhythm: Normal rate.     Pulses: Normal pulses.  Pulmonary:     Effort: Pulmonary effort is normal.  Abdominal:     Comments: Firm and due to size unable to appreciate any characteristics.  Musculoskeletal:        General: No swelling or deformity.  Skin:    General: Skin is warm.     Capillary Refill: Capillary refill takes less than 2 seconds.     Coloration: Skin is not jaundiced.     Findings: Erythema and rash present. No bruising or lesion.  Neurological:     Mental Status: She is alert and oriented to person, place, and time.  Psychiatric:        Mood and Affect: Mood normal.        Behavior: Behavior normal.        Thought Content: Thought content normal.        Judgment: Judgment normal.     Assessment & Plan:  Urinary incontinence, unspecified type  Morbid obesity ([place])  NECK PAIN  Chronic bilateral low back pain with sciatica, sciatica laterality unspecified  Depression with anxiety  B12 deficiency  Other specified hypothyroidism  Diabetic polyneuropathy associated with type 2 diabetes mellitus ([place])  I have recommended to the patient that she seek a general surgery consult for the possibility of a gastric  bypass surgery.  I think the surgery on her pannus is going to be very difficult to to do in general but also very difficult to close because of how tight it is.  She can certainly seek a second opinion at the [facility] with either general surgery or plastic surgery.  No plans for surgery here.  Pictures were obtained of the patient and placed in the chart with the patient's or guardian's permission.   [place], DO

## 2021-08-30 DIAGNOSIS — E1169 Type 2 diabetes mellitus with other specified complication: Secondary | ICD-10-CM | POA: Diagnosis not present

## 2021-08-30 DIAGNOSIS — I10 Essential (primary) hypertension: Secondary | ICD-10-CM | POA: Diagnosis not present

## 2021-08-30 DIAGNOSIS — E039 Hypothyroidism, unspecified: Secondary | ICD-10-CM | POA: Diagnosis not present

## 2021-08-30 DIAGNOSIS — N1832 Chronic kidney disease, stage 3b: Secondary | ICD-10-CM | POA: Diagnosis not present

## 2021-09-02 ENCOUNTER — Telehealth: Payer: Self-pay | Admitting: Pharmacist

## 2021-09-02 NOTE — Chronic Care Management (AMB) (Signed)
    Chronic Care Management Pharmacy Assistant   Name: KALKIDAN CAUDELL  MRN: [id number] DOB: March 20, 1965  09/03/2021 APPOINTMENT REMINDER  [name] was reminded to have all medications, supplements and any blood glucose and blood pressure readings available for review with [name], Pharm. D, at her telephone visit on 09/03/2021 at 11:30.  Care Gaps: AWV - scheduled 07/14/2022 Last BP - 112/63 on 08/09/2021 Last A1C - 6.0 on 07/12/2021 Foot exam - never done Eye exam - never done Papsmear - never done Malb - overdue Covid booster - overdue Flu - overdue Shingrix - postponed Mammogram - postponed Hep C Screen  - postponed  Star Rating Drug: Atorvastatin 10 mg - last filled 07/14/2021 90 DS at [facility] Metformin 500 mg - last filled 07/04/2021 90 [facility] at [facility] Semaglutide '2mg'$ /dose - no fill history  Any gaps in medications fill history? No  [name] [facility]  Catering manager [phone]

## 2021-09-03 ENCOUNTER — Other Ambulatory Visit: Payer: Self-pay | Admitting: Family Medicine

## 2021-09-03 ENCOUNTER — Ambulatory Visit (INDEPENDENT_AMBULATORY_CARE_PROVIDER_SITE_OTHER): Payer: PPO | Admitting: Pharmacist

## 2021-09-03 DIAGNOSIS — E1169 Type 2 diabetes mellitus with other specified complication: Secondary | ICD-10-CM

## 2021-09-03 DIAGNOSIS — I10 Essential (primary) hypertension: Secondary | ICD-10-CM

## 2021-09-03 NOTE — Progress Notes (Signed)
Chronic Care Management Pharmacy Note  09/03/2021 Name:  Christina [name] MRN:  [id number] DOB:  1965/01/31   Summary: BP not at goal < 140/90 per recent home readings Pt has not started spironolactone A1c at goal < 7% and pt is having some low readings Nephrologist stopped a few of her medications  Recommendations/Changes made from today's visit: -Recommended using spironolactone for days with higher BP readings -Recommended decreasing metformin to 1 tablet daily -Requested diclofenac gel prescription to replace tablets   Plan: Follow up after discussion with PCP  Subjective: Christina [name] is an 56 y.o. year old female who is a primary patient of Laurey Morale, MD.  The CCM team was consulted for assistance with disease management and care coordination needs.    Engaged with patient by telephone for follow up visit in response to provider referral for pharmacy case management and/or care coordination services.   Consent to Services:  The patient was given information about Chronic Care Management services, agreed to services, and gave verbal consent prior to initiation of services.  Please see initial visit note for detailed documentation.   Patient Care Team: Laurey Morale, MD as PCP - General Viona Gilmore, [facility] as Pharmacist (Pharmacist)  Recent office visits: 07/12/2021 Alysia Penna MD - Patient was seen for Type 2 diabetes mellitus with other specified complication, without long-term current use of insulin and additional issues. Discontinued Keflex. No follow up noted.    07/09/2021 Rolene Arbour LPN - Encounter for Medicare annual wellness exam.  Recent consult visits: 08/30/21 Cay Schillings, MD (nephrology): Patient presented for initial visit. D/c'd diclofeanc, Celebrex and Bactrim.  08/09/21 Audelia Hives, DO (plastic surgery): Patient presented for urinary incontinence for evaluation of pannus for surgery. Recommended general surgery consult for gastric  bypass.  Hospital visits: None in previous 6 months  Objective:  Lab Results  Component Value Date   CREATININE 1.36 (H) 07/12/2021   BUN 27 (H) 07/12/2021   GFR 43.43 (L) 07/12/2021   GFRNONAA 94.60 09/11/2009   GFRAA  05/03/2009    >60        The eGFR has been calculated using the MDRD equation. This calculation has not been validated in all clinical situations. eGFR's persistently <60 mL/min signify possible Chronic Kidney Disease.   NA 140 07/12/2021   K 4.7 07/12/2021   CALCIUM 9.3 07/12/2021   CO2 29 07/12/2021   GLUCOSE 88 07/12/2021    Lab Results  Component Value Date/Time   HGBA1C 6.0 07/12/2021 09:19 AM   HGBA1C 7.2 (A) 11/16/2020 01:24 PM   HGBA1C 8.1 (H) 07/05/2020 01:21 PM   GFR 43.43 (L) 07/12/2021 09:19 AM   GFR 64.65 11/16/2020 02:32 PM   MICROALBUR 2.5 (H) 02/21/2014 02:22 PM   MICROALBUR 1.7 10/05/2013 10:42 AM    Last diabetic Eye exam: No results found for: "HMDIABEYEEXA"  Last diabetic Foot exam: No results found for: "HMDIABFOOTEX"   Lab Results  Component Value Date   CHOL 166 07/12/2021   HDL 48.50 07/12/2021   LDLCALC 97 02/10/2020   LDLDIRECT 87.0 07/12/2021   TRIG 305.0 (H) 07/12/2021   CHOLHDL 3 07/12/2021       Latest Ref Rng & Units 07/12/2021    9:19 AM 02/10/2020    3:35 PM 05/02/2019    2:51 PM  Hepatic Function  Total Protein 6.0 - 8.3 g/dL 7.0  7.1  7.1   Albumin 3.5 - 5.2 g/dL 4.3   4.6   AST 0 -  37 U/L 17  20  20    ALT 0 - 35 U/L 19  25  20    Alk Phosphatase 39 - 117 U/L 34   48   Total Bilirubin 0.2 - 1.2 mg/dL 0.3  0.4  0.4   Bilirubin, Direct 0.0 - 0.3 mg/dL 0.1  0.1  0.1     Lab Results  Component Value Date/Time   TSH 2.08 11/16/2020 02:32 PM   TSH 2.40 02/10/2020 03:35 PM   FREET4 1.00 11/16/2020 02:32 PM   FREET4 1.2 02/10/2020 03:35 PM       Latest Ref Rng & Units 07/12/2021    9:19 AM 11/16/2020    2:32 PM 02/10/2020    3:35 PM  CBC  WBC 4.0 - 10.5 K/uL 5.8  7.0  7.0   Hemoglobin 12.0 - 15.0  g/dL 12.7  13.0  12.1   Hematocrit 36.0 - 46.0 % 38.7  38.8  36.5   Platelets 150.0 - 400.0 K/uL 199.0  186.0  184     No results found for: "VD25OH"  Clinical ASCVD: No  The 10-year ASCVD risk score (Arnett DK, et al., 2019) is: 7.7%   Values used to calculate the score:     Age: 27 years     Sex: Female     Is Non-Hispanic African American: No     Diabetic: Yes     Tobacco smoker: No     Systolic Blood Pressure: 829 mmHg     Is BP treated: Yes     HDL Cholesterol: 48.5 mg/dL     Total Cholesterol: 166 mg/dL       07/12/2021   11:20 AM 07/09/2021    9:00 AM 11/16/2020    3:29 PM  Depression screen PHQ 2/9  Decreased Interest 1 0 3  Down, Depressed, Hopeless 1 0 2  PHQ - 2 Score 2 0 5  Altered sleeping 3  3  Tired, decreased energy 3  2  Change in appetite 0  3  Feeling bad or failure about yourself  1  1  Trouble concentrating 3  1  Moving slowly or fidgety/restless 3  0  Suicidal thoughts 0  0  PHQ-9 Score 15  15  Difficult doing work/chores   Somewhat difficult      Social History   Tobacco Use  Smoking Status Former  Smokeless Tobacco Never   BP Readings from Last 3 Encounters:  08/09/21 112/63  07/12/21 120/72  11/16/20 (!) 150/78   Pulse Readings from Last 3 Encounters:  08/09/21 77  07/12/21 69  11/16/20 87   Wt Readings from Last 3 Encounters:  08/09/21 214 lb 6.4 oz (97.3 kg)  07/12/21 213 lb (96.6 kg)  07/09/21 210 lb (95.3 kg)   BMI Readings from Last 3 Encounters:  08/09/21 39.21 kg/m  07/12/21 38.96 kg/m  07/09/21 38.41 kg/m    Assessment/Interventions: Review of patient past medical history, allergies, medications, health status, including review of consultants reports, laboratory and other test data, was performed as part of comprehensive evaluation and provision of chronic care management services.   SDOH:  (Social Determinants of Health) assessments and interventions performed: Yes   SDOH Screenings   Alcohol Screen: Low Risk   (07/09/2021)   Alcohol Screen    Last Alcohol Screening Score (AUDIT): 0  Depression (PHQ2-9): High Risk (07/12/2021)   Depression (PHQ2-9)    PHQ-2 Score: 15  Financial Resource Strain: Low Risk  (07/09/2021)   Overall Financial Resource Strain (CARDIA)  Difficulty of Paying Living Expenses: Not hard at all  Food Insecurity: No Food Insecurity (07/09/2021)   Hunger Vital Sign    Worried About Running Out of Food in the Last Year: Never true    Ran Out of Food in the Last Year: Never true  Housing: Low Risk  (07/09/2021)   Housing    Last Housing Risk Score: 0  Physical Activity: Inactive (07/09/2021)   Exercise Vital Sign    Days of Exercise per Week: 0 days    Minutes of Exercise per Session: 0 min  Social Connections: Moderately Isolated (07/09/2021)   Social Connection and Isolation Panel [NHANES]    Frequency of Communication with Friends and Family: More than three times a week    Frequency of Social Gatherings with Friends and Family: More than three times a week    Attends Religious Services: Never    Marine scientist or Organizations: No    Attends Archivist Meetings: Never    Marital Status: Married  Stress: No Stress Concern Present (07/09/2021)   [place]    Feeling of Stress : Not at all  Tobacco Use: Medium Risk (08/09/2021)   Patient History    Smoking Tobacco Use: Former    Smokeless Tobacco Use: Never    Passive Exposure: Not on file  Transportation Needs: No Transportation Needs (07/09/2021)   PRAPARE - Hydrologist (Medical): No    Lack of Transportation (Non-Medical): No    CCM Care Plan  Allergies  Allergen Reactions   Antihistamines, Loratadine-Type Other (See Comments)    Burns skin   Bee Venom Swelling   Clindamycin/Lincomycin Itching   Codeine Other (See Comments)    Hallucinations    Doxycycline Other (See Comments)    Hallucinations     Lisinopril     REACTION: facial swelling   Metronidazole     REACTION: rash   Ondansetron Hcl     REACTION: rash   Tylox [Oxycodone-Acetaminophen] Other (See Comments)    Hallucinations    Vancomycin     REACTION: rash   Albuterol Palpitations    Tachycardia   Benadryl [Diphenhydramine] Nausea And Vomiting   Lidocaine Rash   Oxycodone-Acetaminophen Rash    Medications Reviewed Today     Reviewed by Viona Gilmore, [facility] (Pharmacist) on 09/03/21 at 1226  Med List Status: <None>   Medication Order Taking? Sig Documenting Provider Last Dose Status Informant  aspirin 81 MG EC tablet [id number]  Take 81 mg by mouth daily. [provider]  Active   atorvastatin (LIPITOR) 10 MG tablet [id number]  TAKE ONE TABLET BY MOUTH ONCE DAILY Laurey Morale, MD  Active   cholecalciferol (VITAMIN D) 1000 UNITS tablet [id number]  Take 1,000 Units by mouth daily. [provider]  Active   Cinnamon 500 MG TABS [id number]  Take 2 tablets by mouth daily. [provider]  Active   clobetasol (TEMOVATE) 0.05 % external solution [id number]  Apply 1 application topically 2 (two) times daily. Laurey Morale, MD  Active   Continuous Blood Gluc Receiver (FREESTYLE LIBRE 14 DAY READER) [place] [id number]  1 application by Does not apply route every 14 (fourteen) days. Laurey Morale, MD  Active   Continuous Blood Gluc Sensor (FREESTYLE LIBRE 2 SENSOR) [place] [id number]  USE TO check blood glucose AS DIRECTED AND CHANGE sensor every 14 DAYS Laurey Morale, MD  Active   [employer]  ULTRA THIN LANCETS MISC [id number]  1 strip by Does not apply route as directed. Laurey Morale, MD  Active   cyclobenzaprine (FLEXERIL) 10 MG tablet [id number]  TAKE ONE TABLET BY MOUTH THREE TIMES DAILY AS NEEDED FOR muscle SPASMS Laurey Morale, MD  Active   desonide (DESOWEN) 0.05 % cream [id number]  Apply topically 2 (two) times daily. [provider]  Active   diclofenac (VOLTAREN) 75 MG EC tablet [id number]  TAKE ONE  TABLET BY MOUTH TWICE DAILY Laurey Morale, MD  Active   diltiazem (CARDIZEM CD) 360 MG 24 hr capsule [id number]  TAKE ONE CAPSULE BY MOUTH DAILY Laurey Morale, MD  Active   diphenhydrAMINE (BENADRYL) 25 mg capsule [id number]  Take 25 mg by mouth as needed. [provider]  Active   EPINEPHrine 0.3 mg/0.3 mL IJ SOAJ injection [id number]  Inject 0.3 mg into the muscle as needed for anaphylaxis. Laurey Morale, MD  Active   fenofibrate 160 MG tablet [id number]  Take 1 tablet (160 mg total) by mouth daily. Laurey Morale, MD  Active   gabapentin (NEURONTIN) 300 MG capsule [id number]  TAKE TWO CAPSULES BY MOUTH THREE TIMES DAILY Laurey Morale, MD  Active   glucose blood (TRUETEST TEST) test strip [id number]  Use as instructed Laurey Morale, MD  Active   HYDROmorphone (DILAUDID) 4 MG tablet [id number]  Take 1 tablet (4 mg total) by mouth every 6 (six) hours as needed for severe pain. Laurey Morale, MD  Active   levothyroxine (SYNTHROID) 50 MCG tablet [id number]  TAKE ONE TABLET BY MOUTH EVERY MORNING Laurey Morale, MD  Active   metFORMIN (GLUCOPHAGE-XR) 500 MG 24 hr tablet [id number]  TAKE 1 TABLET BY MOUTH TWICE A DAY WITH MEAL Laurey Morale, MD  Active   metoprolol succinate (TOPROL-XL) 100 MG 24 hr tablet [id number]  TAKE ONE TABLET BY MOUTH ONCE DAILY WITH FOOD Laurey Morale, MD  Active   mupirocin ointment (BACTROBAN) 2 % [id number]  Apply topically 3 (three) times daily. Laurey Morale, MD  Active   omeprazole (PRILOSEC) 20 MG capsule [id number]  TAKE ONE CAPSULE BY MOUTH TWICE DAILY Laurey Morale, MD  Active     Discontinued 09/22/11 [phone] (Reorder)   potassium chloride (KLOR-CON M) 10 MEQ tablet [id number]  Take 1 tablet (10 mEq total) by mouth daily. Laurey Morale, MD  Active   promethazine (PHENERGAN) 25 MG tablet [id number]  Take 1 tablet (25 mg total) by mouth every 6 (six) hours as needed for nausea. Laurey Morale, MD  Active   Semaglutide, 2 MG/DOSE, 8 MG/3ML [place] [id number]  Inject 2 mg  as directed once a week. Laurey Morale, MD  Active   spironolactone (ALDACTONE) 25 MG tablet [id number] No Take 1 tablet (25 mg total) by mouth daily.  Patient not taking: Reported on 09/03/2021   Laurey Morale, MD Not Taking Active   temazepam (RESTORIL) 30 MG capsule [id number]  TAKE ONE CAPSULE BY MOUTH EVERYDAY AT BEDTIME AS NEEDED FOR SLEEP Laurey Morale, MD  Active   traMADol (ULTRAM) 50 MG tablet [id number]  TAKE TWO TABLETS BY MOUTH three times daily AS NEEDED FOR moderate pain Laurey Morale, MD  Active   triamcinolone cream (KENALOG) 0.1 % [id number]  Apply 1 application topically 2 (two) times daily. Laurey Morale, MD  Active   venlafaxine XR (EFFEXOR-XR) 150 MG 24 hr capsule [id number]  Take 1 capsule (150 mg  total) by mouth every morning. Laurey Morale, MD  Active   vitamin B-12 (CYANOCOBALAMIN) 1000 MCG tablet [id number]  Take 1,000 mcg by mouth daily. 2500 mcg once daily [provider]  Active Self  vitamin E 180 MG (400 UNITS) capsule [id number]  Take 400 Units by mouth daily. [provider]  Active Self            Patient Active Problem List   Diagnosis Date Noted   Stage 3b chronic kidney disease ([place]) 07/15/2021   Urinary incontinence 11/16/2020   Hyperlipidemia    Depression with anxiety 07/13/2019   Psoriasis 10/29/2016   Morbid obesity ([place]) 01/04/2016   Hypothyroidism 04/05/2014   Type 2 diabetes mellitus with other specified complication ([place]) 10/62/6948   NECK PAIN 11/28/2009   BRACHIAL NEURITIS OR RADICULITIS NOS 11/28/2009   B12 deficiency 09/14/2009   HYPERTRIGLYCERIDEMIA 09/14/2009   Diabetic neuropathy ([place]) 09/11/2009   ECZEMA 06/07/2009   Anxiety state 07/26/2008   [place] DISEASE, LUMBAR 06/22/2007   ARTHRITIS 06/11/2007   LOW BACK PAIN 12/28/2006   Essential hypertension 09/11/2006   Asthma 09/11/2006   GERD 09/11/2006   History of colonic polyps 09/11/2006    Immunization History  Administered Date(s) Administered    Influenza Whole 12/27/2004, 11/27/2008   Influenza,inj,Quad PF,6+ Mos 10/08/2012, 10/05/2013, 10/06/2014, 01/04/2016, 10/29/2016, 12/23/2017, 11/30/2018, 11/07/2019, 11/16/2020   PFIZER(Purple Top)SARS-COV-2 Vaccination 05/20/2019, 06/15/2019   Pneumococcal Polysaccharide-23 01/16/2020   Tdap 04/01/2017   Patient reports she is feeling more run down lately. She went to a meeting at school for her granddaughter and this was stressful. Patient's husband is now on third shift and he goes in at 11pm and gets in at 7am. Patient hasn't been sleeping well but not a lot of changes with her sleep recently.  Patient was told to stop 3 of her medications when she met with the nephrologist. She has stopped them and removed them from her medication list. Patient was only on the Bactrim for a face rash but that hasn't been clearing up and isn't getting worse either. She will meet with dermatology to discuss.  Patient is having some low blood sugars all month. Patient reports she eats when she is hungry and is not hungry as much. Patient is seeing some lower blood sugar readings at least 4 times a week. Patient is seeing some readings as low as 50.  Conditions to be addressed/monitored:  Hypertension, Hyperlipidemia, Diabetes, GERD, Hypothyroidism, Anxiety, Osteoarthritis and insomnia, vitamin B12 deficiency  Conditions addressed this visit: Diabetes, hypertension  Care Plan : CCM Pharmacy Care Plan  Updates made by Viona Gilmore, [place] since 09/03/2021 12:00 AM     Problem: Problem: Hypertension, Hyperlipidemia, Diabetes, GERD, Hypothyroidism, Anxiety, Osteoarthritis and insomnia, vitamin B12 deficiency      Long-Range Goal: Patient-Specific Goal   Start Date: 04/27/2020  Expected End Date: 04/27/2021  Recent Progress: Not on track  Priority: High  Note:   Current Barriers:  Unable to independently monitor therapeutic efficacy Unable to maintain control of blood pressure Suboptimal therapeutic  regimen for cholesterol   Pharmacist Clinical Goal(s):  Patient will achieve adherence to monitoring guidelines and medication adherence to achieve therapeutic efficacy achieve control of diabetes as evidenced by A1c  through collaboration with PharmD and provider.   Interventions: 1:1 collaboration with Laurey Morale, MD regarding development and update of comprehensive plan of care as evidenced by provider attestation and co-signature Inter-disciplinary care team collaboration (see longitudinal plan of care) Comprehensive medication review performed; medication  list updated in electronic medical record  Hypertension (BP goal <140/90) -Uncontrolled -Current treatment: diltiazem (Cardizem CD) 380m, 1 capsule daily - in PM - Appropriate, Query effective, Safe, Accessible metoprolol succinate 1027monce daily  - in AM - Appropriate, Query effective, Safe, Accessible Spironolactone 25 mg 1 tablet daily - never started  -Medications previously tried: lisinopril (facial swelling) metoprolol succinate 10072mHCTZ 102m64matient reports these were stopped when she started diltiazem) -Current home readings: 169/95, 148/80, 130/75 a few days ago, 120/70 (once or twice a week) - arm cuff has brought it in -Current dietary habits: did not discuss -Current exercise habits: no structured exercise; limited in mobility -Reports hypotensive/hypertensive symptoms -Educated on BP goals and benefits of medications for prevention of heart attack, stroke and kidney damage; Daily salt intake goal < 2300 mg; Exercise goal of 150 minutes per week; Importance of home blood pressure monitoring; Proper BP monitoring technique; -Counseled to monitor BP at home a few times weekly, document, and provide log at future appointments -Counseled on diet and exercise extensively Recommended to continue current medication Recommended trying spironolactone on days her BP is higher than 140/90.  Hyperlipidemia: (LDL  goal < 100) -Controlled -Current treatment: atorvastatin 10mg60mtablet daily -  Appropriate, Query effective, Safe, Accessible fenofibrate 160mg,63mablet daily - Appropriate, Query effective, Safe, Accessible -Medications previously tried: none  -Current dietary patterns: patient is no longer eating red meat -Current exercise habits: limited in mobility -Educated on Cholesterol goals;  Benefits of statin for ASCVD risk reduction; Importance of limiting foods high in cholesterol; Exercise goal of 150 minutes per week; -Counseled on diet and exercise extensively  Diabetes (A1c goal <7%) -Controlled -Current medications: semaglutide (Ozempic) 2 mg once a week - Appropriate, Effective, Safe, Accessible metformin XR 500mg 180mlet twice daily  - Appropriate, Effective, Safe, Accessible -Medications previously tried: glipizide  -Current home glucose readings: using a CGM fasting glucose: has had some lower readings lately - low as 50, 128 today; average 131 (last 7 days), 136 (last 14 days) post prandial glucose: n/a -Denies hypoglycemic/hyperglycemic symptoms -Current meal patterns: sometimes eating once or twice a day (needs to eat cheaper/easier foods) breakfast: cinnamon poptart  lunch: did not discuss dinner: did not discuss snacks: did not discuss drinks: half and half tea, water -Current exercise: limited in mobility -Educated on A1c and blood sugar goals; Exercise goal of 150 minutes per week; Benefits of routine self-monitoring of blood sugar; Carbohydrate counting and/or plate method -Counseled to check feet daily and get yearly eye exams -Counseled on diet and exercise extensively Recommended to continue current medication Recommended decreasing metformin to 1 tablet daily.  Depression/Anxiety (Goal: minimize symptoms) -Controlled -Current treatment: venlafaxine XR 150mg, 183msule one daily with breakfast - Appropriate, Effective, Safe, Accessible -Medications  previously tried/failed: sertraline -PHQ9: 14  -GAD7: n/a -Educated on Benefits of medication for symptom control -Recommended to continue current medication  Insomnia (Goal: improve quality and quantity of sleep) -Controlled -Current treatment  temazepam 30mg, 1 29mule at bedtime as needed for sleep - Appropriate, Effective, Query Safe, Accessible Melatonin 10 mg, 1 capsule at bedtime - Appropriate, Effective, Query Safe, Accessible -Medications previously tried: n/a  -Counseled on non-pharmacological interventions for insomnia. (Avoid napping, limit exposure to technology near bedtime, etc). ; discussed trying 5 mg dose instead of 10 mg and stressed the importance of timed release to help with staying asleep  Hypothyroidism (Goal: TSH 0.35-4.5) -Controlled -Current treatment  levothyroxine 50mcg, 1 79met daily - Appropriate, Effective, Safe,  Accessible -Medications previously tried: none  -Recommended to continue current medication  Arthritis/muscle spasms (Goal: minimize pain) -Controlled -Current treatment  tramadol 23m, 2 tablets three times daily as needed for moderate pain - Appropriate, Effective, Safe, Accessible hydromorphone 474m 1 tablet every six hours as needed for severe pain  - Appropriate, Effective, Safe, Accessible cyclobenzaprine 1027m1 tablet three times daily as needed for muscle spasms - Appropriate, Effective, Query Safe, Accessible benadryl 25 mg - takes this only with the hydromorphone Voltaren gel (diclofenac) as needed - Appropriate, Effective, Safe, Accessible -Medications previously tried: Opana (oxymorphone), diclofenac & celebrex stopped due to kidney function -Recommended switching to diclofenac gel and requested prescription.  Vitamin B12 deficiency (Goal: 211-911) -Not ideally controlled -Current treatment  Vitamin B12 1000 mcg 1 tablet daily - Appropriate, Effective, Safe, Accessible -Medications previously tried: none  -Recommended  decreasing to every other day and then buying 500 mcg to take daily to avoid oversupplementation  GERD (Goal: minimize symptoms) -Controlled -Current treatment  omeprazole 66m72m capsule daily as needed - Appropriate, Effective, Safe, Accessible -Medications previously tried: none  -Counseled on non-pharmacological interventions for acid reflux. Take measures to prevent acid reflux, such as avoiding spicy foods, avoiding caffeine, avoid laying down a few hours after eating, and raising the head of the bed.  Neuropathy (Goal: minimize pain) -Controlled -Current treatment  gabapentin 300mg44mcapsules three times daily - Appropriate, Effective, Safe, Accessible -Medications previously tried: none  -Recommended to continue current medication  Skin lesions (Goal: minimize symptoms) -Controlled -Current treatment  mupirocin 2% ointment, apply three times daily  triamcinolone 0.1% cream, apply twice daily -Medications previously tried: Bactrim (stopped with nephrologist due to kidney function) -Recommended to continue current medication   Health Maintenance -Vaccine gaps: shingles, COVID booster -Current therapy:  Biotin 5000 mcg (collagen support) 1 tablet daily Aspirin 81 mg 1 tablet daily -Educated on Cost vs benefit of each product must be carefully weighed by individual consumer -Patient is satisfied with current therapy and denies issues -Recommended to continue current medication  Patient Goals/Self-Care Activities Patient will:  - take medications as prescribed check glucose daily, document, and provide at future appointments check blood pressure a few times a week, document, and provide at future appointments engage in dietary modifications by adding more vegetables to her diet  Follow Up Plan: The care management team will reach out to the patient again over the next 7 days.        Medication Assistance:  Ozempic obtained through [employer] medication assistance  program.  Enrollment ends 01/26/22  Compliance/Adherence/Medication fill history: Care Gaps: Foot exam, eye exam, PAP smear, urine microalbumin, shingrix, eye exam, COVID booster, Prevnar20, influenza, mammogram, Hep C screening Last BP - 112/63 on 08/09/2021 Last A1C - 6.0 on 07/12/2021    Star-Rating Drugs: Atorvastatin 10 mg - last filled 07/14/2021 90 [facility] at [facility] Metformin 500 mg - last filled 07/04/2021 90 [facility] at [facility] Semaglutide 2mg -28mtting through PAP  [facility] [place] [address]: [phone] pill box? Yes - in AM/PM - night pills in old pill bottles (10 bottles), keeping other pills (out of sight) Pt endorses 100% compliance  We discussed: Benefits of medication synchronization, packaging and delivery as well as enhanced pharmacist oversight with [facility]. Patient decided to: Utilize [facility] pharmacy for medication synchronization, packaging and delivery  Care Plan and  Follow Up Patient Decision:  Patient agrees to Care Plan and Follow-up.  Plan: The care management team will reach out to the patient again over the next 7 days.  [name], PharmD, [place] Pharmacist [place] [facility] [place]  Patient is due for next adherence delivery on: 09/10/2021  This delivery to include: Temazepam 30 mg 1 capsule at bedtime as needed.  Levothyroxine 50 mcg 1 tablet every morning Diclofenac gel as needed   Medications declined:  Tramadol 50 mg 2 tablets 3 times daily as needed (uses PRN) Metoprolol succinate  100 MG take 1 tablet by mouth daily (has enough until next month's call) Cyclobenzaprine 10 mg: take 1 tablet three times a day as needed (uses PRN) Celebrex 200 mg 1 capsule twice daily as needed (discontinued)  Diclofenac 75 mg 1 tablet 2 times daily  (discontinued) Gabapentin 300 mg 2 capsules 3 times daily (has enough until next month's call) Omeprazole 20 mg 1 capsule twice daily (not taking BID; has enough until next month's call) Diltiazem 360 mg 1 tablet daily (has enough until next month's call) Potassium chloride  10 MEQ: one tablet daily (Sept) Metformin 500 MG: One tablet twice a day (Sept) Fenofibrate 160 mg: one tablet daily (Sept) Bactrim DS take 1 tablet twice daily (discontinued) Venlafaxine XR 150 mg: one capsule every morning (Sept Freestyle Libre 14 day sensor (Sept) Atorvastatin 10 mg 1 tablet daily (Sept) Spironolactone 25 mg 1 tablet daily (not taking)

## 2021-09-04 ENCOUNTER — Other Ambulatory Visit: Payer: Self-pay | Admitting: Family Medicine

## 2021-09-04 NOTE — Telephone Encounter (Signed)
Last OV- 07/12/21 Last refill-05/07/21--240 tabs, 1 refill  No future OV scheduled.

## 2021-09-12 ENCOUNTER — Encounter: Payer: Self-pay | Admitting: Family Medicine

## 2021-09-12 ENCOUNTER — Telehealth (INDEPENDENT_AMBULATORY_CARE_PROVIDER_SITE_OTHER): Payer: PPO | Admitting: Family Medicine

## 2021-09-12 DIAGNOSIS — N1832 Chronic kidney disease, stage 3b: Secondary | ICD-10-CM | POA: Diagnosis not present

## 2021-09-12 DIAGNOSIS — M5440 Lumbago with sciatica, unspecified side: Secondary | ICD-10-CM | POA: Diagnosis not present

## 2021-09-12 DIAGNOSIS — I10 Essential (primary) hypertension: Secondary | ICD-10-CM

## 2021-09-12 DIAGNOSIS — E1169 Type 2 diabetes mellitus with other specified complication: Secondary | ICD-10-CM | POA: Diagnosis not present

## 2021-09-12 DIAGNOSIS — G8929 Other chronic pain: Secondary | ICD-10-CM | POA: Diagnosis not present

## 2021-09-12 MED ORDER — DICLOFENAC SODIUM 1 % EX GEL
4.0000 g | Freq: Four times a day (QID) | CUTANEOUS | 5 refills | Status: DC
Start: 2021-09-12 — End: 2023-04-08

## 2021-09-12 NOTE — Progress Notes (Signed)
   Subjective:    Patient ID: Christina [name], female    DOB: 10-09-1965, 56 y.o.   MRN: [id number]  HPI Virtual Visit via Telephone Note  I connected with the patient on 09/12/21 at 10:00 AM EDT by telephone and verified that I am speaking with the correct person using two identifiers.   I discussed the limitations, risks, security and privacy concerns of performing an evaluation and management service by telephone and the availability of in person appointments. I also discussed with the patient that there may be a patient responsible charge related to this service. The patient expressed understanding and agreed to proceed.  Location patient: home Location provider: work or home office Participants present for the call: patient, provider Patient did not have a visit in the prior 7 days to address this/these issue(s).   History of Present Illness: Here to discuss medication changes after she saw her nephrologist in [facility], Dr. Cay Schillings, on 08-30-21. Her CKD has worsened very slightly. Her creatinine was 1.24 and the GFR was 51. She advised Christina [name] to stop taking Metformin and to stop all NSAIDs. This includes the Celebrex and the Voltaren she had been taking. Her BP has been stable. Her diabetes has been well controlled, and her A1c on 08-30-21 was down to 5.8%.    Observations/Objective: Patient sounds cheerful and well on the phone. I do not appreciate any SOB. Speech and thought processing are grossly intact. Patient reported vitals:  Assessment and Plan: She is doing well overall. By losing some weight she has gotten her diabetes and HTN under good control. We will stop the Metformin and the Voltaren pills and the Celebrex. She will try Voltaren 1% gel instead. Recheck an A1c in 90 days.  Alysia Penna, MD   Follow Up Instructions:     [phone] 11-20 [phone] 21-30 I did not refer this patient for an OV in the next 24 hours for this/these issue(s).  I discussed the  assessment and treatment plan with the patient. The patient was provided an opportunity to ask questions and all were answered. The patient agreed with the plan and demonstrated an understanding of the instructions.   The patient was advised to call back or seek an in-person evaluation if the symptoms worsen or if the condition fails to improve as anticipated.  I provided 17 minutes of non-face-to-face time during this encounter.   Alysia Penna, MD     Review of Systems     Objective:   Physical Exam        Assessment & Plan:

## 2021-09-17 ENCOUNTER — Other Ambulatory Visit: Payer: Self-pay | Admitting: Family Medicine

## 2021-09-17 DIAGNOSIS — I700 Atherosclerosis of aorta: Secondary | ICD-10-CM | POA: Diagnosis not present

## 2021-09-17 DIAGNOSIS — D3501 Benign neoplasm of right adrenal gland: Secondary | ICD-10-CM | POA: Diagnosis not present

## 2021-09-17 DIAGNOSIS — R1030 Lower abdominal pain, unspecified: Secondary | ICD-10-CM | POA: Diagnosis not present

## 2021-09-17 DIAGNOSIS — R319 Hematuria, unspecified: Secondary | ICD-10-CM | POA: Diagnosis not present

## 2021-09-17 DIAGNOSIS — E1169 Type 2 diabetes mellitus with other specified complication: Secondary | ICD-10-CM

## 2021-09-17 DIAGNOSIS — M5136 Other intervertebral disc degeneration, lumbar region: Secondary | ICD-10-CM | POA: Diagnosis not present

## 2021-09-17 DIAGNOSIS — M48061 Spinal stenosis, lumbar region without neurogenic claudication: Secondary | ICD-10-CM | POA: Diagnosis not present

## 2021-09-17 DIAGNOSIS — E278 Other specified disorders of adrenal gland: Secondary | ICD-10-CM | POA: Diagnosis not present

## 2021-09-17 DIAGNOSIS — M4316 Spondylolisthesis, lumbar region: Secondary | ICD-10-CM | POA: Diagnosis not present

## 2021-09-25 ENCOUNTER — Other Ambulatory Visit: Payer: Self-pay | Admitting: Family Medicine

## 2021-09-25 DIAGNOSIS — E785 Hyperlipidemia, unspecified: Secondary | ICD-10-CM

## 2021-09-26 ENCOUNTER — Telehealth: Payer: Self-pay | Admitting: Pharmacist

## 2021-09-26 DIAGNOSIS — M1990 Unspecified osteoarthritis, unspecified site: Secondary | ICD-10-CM

## 2021-09-26 DIAGNOSIS — E1169 Type 2 diabetes mellitus with other specified complication: Secondary | ICD-10-CM

## 2021-09-26 DIAGNOSIS — Z7984 Long term (current) use of oral hypoglycemic drugs: Secondary | ICD-10-CM

## 2021-09-26 DIAGNOSIS — F32A Depression, unspecified: Secondary | ICD-10-CM

## 2021-09-26 DIAGNOSIS — I10 Essential (primary) hypertension: Secondary | ICD-10-CM | POA: Diagnosis not present

## 2021-09-26 DIAGNOSIS — E785 Hyperlipidemia, unspecified: Secondary | ICD-10-CM

## 2021-09-26 NOTE — Chronic Care Management (AMB) (Signed)
Chronic Care Management Pharmacy Assistant   Name: Christina [name]  MRN: [id number] DOB: [birth year]  Reason for Encounter: Medication Review / Medication Coordination Call   Recent office visits:  09/12/2021 Alysia Penna MD - Patient was seen for Type 2 diabetes mellitus with other specified complication, without long-term current use of insulin and additional issues. Discontinued Diclofenac 75 mg, started Diclofenac 4 gm topical. Discontinued Metformin. No follow up noted.   Recent consult visits:  None  Hospital visits:  None  Medications: Outpatient Encounter Medications as of 09/26/2021  Medication Sig   aspirin 81 MG EC tablet Take 81 mg by mouth daily.   atorvastatin (LIPITOR) 10 MG tablet TAKE ONE TABLET BY MOUTH ONCE DAILY   cholecalciferol (VITAMIN D) 1000 UNITS tablet Take 1,000 Units by mouth daily.   Cinnamon 500 MG TABS Take 2 tablets by mouth daily.   clobetasol (TEMOVATE) 0.05 % external solution Apply 1 application topically 2 (two) times daily.   Continuous Blood Gluc Receiver (FREESTYLE LIBRE 14 DAY READER) DEVI 1 application by Does not apply route every 14 (fourteen) days.   Continuous Blood Gluc Sensor (FREESTYLE LIBRE 2 SENSOR) MISC USE TO check blood glucose AS DIRECTED AND CHANGE sensor every 14 DAYS   [employer] ULTRA THIN LANCETS MISC 1 strip by Does not apply route as directed.   cyclobenzaprine (FLEXERIL) 10 MG tablet TAKE ONE TABLET BY MOUTH THREE TIMES DAILY AS NEEDED FOR muscle SPASMS   desonide (DESOWEN) 0.05 % cream Apply topically 2 (two) times daily.   diclofenac Sodium (VOLTAREN) 1 % GEL Apply 4 g topically 4 (four) times daily.   diltiazem (CARDIZEM CD) 360 MG 24 hr capsule TAKE ONE CAPSULE BY MOUTH ONCE DAILY   diphenhydrAMINE (BENADRYL) 25 mg capsule Take 25 mg by mouth as needed.   EPINEPHrine 0.3 mg/0.3 mL IJ SOAJ injection Inject 0.3 mg into the muscle as needed for anaphylaxis.   fenofibrate 160 MG tablet TAKE ONE TABLET BY MOUTH ONCE DAILY    gabapentin (NEURONTIN) 300 MG capsule TAKE TWO CAPSULES BY MOUTH THREE TIMES DAILY   glucose blood (TRUETEST TEST) test strip Use as instructed   HYDROmorphone (DILAUDID) 4 MG tablet Take 1 tablet (4 mg total) by mouth every 6 (six) hours as needed for severe pain.   levothyroxine (SYNTHROID) 50 MCG tablet TAKE ONE TABLET BY MOUTH EVERY MORNING   metoprolol succinate (TOPROL-XL) 100 MG 24 hr tablet TAKE ONE TABLET BY MOUTH ONCE DAILY WITH A MEAL   mupirocin ointment (BACTROBAN) 2 % Apply topically 3 (three) times daily.   omeprazole (PRILOSEC) 20 MG capsule TAKE ONE CAPSULE BY MOUTH TWICE DAILY   potassium chloride (KLOR-CON M) 10 MEQ tablet TAKE ONE TABLET BY MOUTH ONCE DAILY   promethazine (PHENERGAN) 25 MG tablet Take 1 tablet (25 mg total) by mouth every 6 (six) hours as needed for nausea.   Semaglutide, 2 MG/DOSE, 8 MG/3ML SOPN Inject 2 mg as directed once a week.   spironolactone (ALDACTONE) 25 MG tablet Take 1 tablet (25 mg total) by mouth daily.   temazepam (RESTORIL) 30 MG capsule TAKE ONE CAPSULE BY MOUTH EVERYDAY AT BEDTIME AS NEEDED FOR SLEEP   traMADol (ULTRAM) 50 MG tablet TAKE TWO TABLETS BY MOUTH three times daily AS NEEDED FOR moderate pain   triamcinolone cream (KENALOG) 0.1 % Apply 1 application topically 2 (two) times daily.   venlafaxine XR (EFFEXOR-XR) 150 MG 24 hr capsule TAKE ONE CAPSULE BY MOUTH EVERY MORNING   vitamin B-12 (  CYANOCOBALAMIN) 1000 MCG tablet Take 1,000 mcg by mouth daily. 2500 mcg once daily   vitamin E 180 MG (400 UNITS) capsule Take 400 Units by mouth daily.   [DISCONTINUED] potassium chloride (KLOR-CON 10) 10 MEQ CR tablet Take 1 tablet (10 mEq total) by mouth daily.   No facility-administered encounter medications on file as of 09/26/2021.   Reviewed chart for medication changes ahead of medication coordination call.  No OVs, Consults, or hospital visits since last care coordination call/Pharmacist visit. (If appropriate, list visit date, provider  name)  No medication changes indicated OR if recent visit, treatment plan here.  BP Readings from Last 3 Encounters:  08/09/21 112/63  07/12/21 120/72  11/16/20 (!) 150/78    Lab Results  Component Value Date   HGBA1C 6.0 07/12/2021     Patient obtains medications through Vials  90 Days   Last adherence delivery included: Temazepam 30 mg 1 capsule at bedtime as needed.  Cyclobenzaprine 10 mg: take 1 tablet three times a day as needed  Tramadol 50 mg 2 tablets 3 times daily as needed Celebrex 200 mg 1 capsule twice daily as needed   Medication last filled not due until August: Levothyroxine 50 mcg 1 tablet every morning Metoprolol succinate  100 MG take 1 tablet by mouth daily  Diclofenac 75 mg 1 tablet 2 times daily  Gabapentin 300 mg 2 capsules 3 times daily  Omeprazole 20 mg 1 capsule twice daily   Diltiazem 360 mg 1 tablet daily   Patient declined meds last month:   Medication last filled not due until August or September: Levothyroxine 50 mcg 1 tablet every morning (Aug) Metoprolol succinate  100 MG take 1 tablet by mouth daily (Aug) Diclofenac 75 mg 1 tablet 2 times daily (Aug) Gabapentin 300 mg 2 capsules 3 times daily (Aug) Omeprazole 20 mg 1 capsule twice daily (Aug)  Diltiazem 360 mg 1 tablet daily (Aug) Potassium chloride  10 MEQ: one tablet daily (Sept) Metformin  500 MG: One tablet twice a day (Sept) Fenofibrate 160 mg: one tablet daily (Sept) Bactrim DS take 1 tablet twice daily (Sept) Venlafaxine XR 150 mg: one capsule every morning (Sept Freestyle Libre 14 day sensor (Sept) Atorvastatin 10 mg 1 tablet daily (Sept) Spironolactone 25 mg 1 tablet daily (Sept)   Patient is due for next adherence delivery on: 10/09/2021   Called patient and reviewed medications and coordinated delivery.   This delivery to include: Temazepam 30 mg 1 capsule at bedtime as needed.  Atorvastatin 10 mg 1 tablet daily Fenofibrate 160 mg: one tablet daily Potassium chloride   10 MEQ: one tablet daily Venlafaxine XR 150 mg: one capsule every morning Freestyle Libre 14 day sensor Tramadol 50 mg 2 tablets 3 times daily as needed Cyclobenzaprine 10 mg: take 1 tablet three times a day as needed  Metoprolol succinate  100 MG take 1 tablet by mouth daily Gabapentin 300 mg 2 capsules 3 times daily  Omeprazole 20 mg 1 capsule twice daily  Patient will need a short fill: No short fill needed   Coordinated acute fill : No acute fill needed   Medications declined:  Spironolactone 25 mg 1 tablet daily, pt has plenty on hand Metformin  500 MG: medication discontinued  Confirmed delivery date of 10/09/2021, advised patient that pharmacy will contact them the morning of delivery.   Care Gaps: AWV - scheduled 07/14/2022 Last BP - 112/63 on 08/09/2021 Last A1C - 6.0 on 07/12/2021 Foot exam - never done Eye exam -  never done Pap smear - never done Malb - overdue Covid booster - overdue Flu - due Shingrix - postponed Mammogram - postponed Hep C Screen - postponed  Star Rating Drugs: Atorvastatin 10 mg - last filled 07/04/2021 90 DS at [facility] Semaglutide '2mg'$ /dose - no fill history  [place] Pharmacist Assistant [phone]

## 2021-10-21 ENCOUNTER — Telehealth: Payer: Self-pay | Admitting: Pharmacist

## 2021-10-21 NOTE — Chronic Care Management (AMB) (Signed)
    Chronic Care Management Pharmacy Assistant   Name: Christina [name]  MRN: [id number] DOB: October 15, 1965  Reason for Encounter: Eye exam Gaps.  Spoke with patient, she states she has not had an eye exam in the past year, she plans to find a physician that is closer to her home in the [place] area.    [place] Pharmacist Assistant [phone]

## 2021-10-28 ENCOUNTER — Telehealth: Payer: Self-pay | Admitting: Pharmacist

## 2021-10-28 NOTE — Chronic Care Management (AMB) (Signed)
Chronic Care Management Pharmacy Assistant   Name: Christina [name]  MRN: [id number] DOB: 1965/06/26  Reason for Encounter: Medication Review / Medication Coordination Call  Recent office visits:  None  Recent consult visits:  None  Hospital visits:  None  Medications: Outpatient Encounter Medications as of 10/28/2021  Medication Sig   aspirin 81 MG EC tablet Take 81 mg by mouth daily.   atorvastatin (LIPITOR) 10 MG tablet TAKE ONE TABLET BY MOUTH ONCE DAILY   cholecalciferol (VITAMIN D) 1000 UNITS tablet Take 1,000 Units by mouth daily.   Cinnamon 500 MG TABS Take 2 tablets by mouth daily.   clobetasol (TEMOVATE) 0.05 % external solution Apply 1 application topically 2 (two) times daily.   Continuous Blood Gluc Receiver (FREESTYLE LIBRE 14 DAY READER) DEVI 1 application by Does not apply route every 14 (fourteen) days.   Continuous Blood Gluc Sensor (FREESTYLE LIBRE 2 SENSOR) MISC USE TO check blood glucose AS DIRECTED AND CHANGE sensor every 14 DAYS   [employer] ULTRA THIN LANCETS MISC 1 strip by Does not apply route as directed.   cyclobenzaprine (FLEXERIL) 10 MG tablet TAKE ONE TABLET BY MOUTH THREE TIMES DAILY AS NEEDED FOR muscle SPASMS   desonide (DESOWEN) 0.05 % cream Apply topically 2 (two) times daily.   diclofenac Sodium (VOLTAREN) 1 % GEL Apply 4 g topically 4 (four) times daily.   diltiazem (CARDIZEM CD) 360 MG 24 hr capsule TAKE ONE CAPSULE BY MOUTH ONCE DAILY   diphenhydrAMINE (BENADRYL) 25 mg capsule Take 25 mg by mouth as needed.   EPINEPHrine 0.3 mg/0.3 mL IJ SOAJ injection Inject 0.3 mg into the muscle as needed for anaphylaxis.   fenofibrate 160 MG tablet TAKE ONE TABLET BY MOUTH ONCE DAILY   gabapentin (NEURONTIN) 300 MG capsule TAKE TWO CAPSULES BY MOUTH THREE TIMES DAILY   glucose blood (TRUETEST TEST) test strip Use as instructed   HYDROmorphone (DILAUDID) 4 MG tablet Take 1 tablet (4 mg total) by mouth every 6 (six) hours as needed for severe pain.    levothyroxine (SYNTHROID) 50 MCG tablet TAKE ONE TABLET BY MOUTH EVERY MORNING   metoprolol succinate (TOPROL-XL) 100 MG 24 hr tablet TAKE ONE TABLET BY MOUTH ONCE DAILY WITH A MEAL   mupirocin ointment (BACTROBAN) 2 % Apply topically 3 (three) times daily.   omeprazole (PRILOSEC) 20 MG capsule TAKE ONE CAPSULE BY MOUTH TWICE DAILY   potassium chloride (KLOR-CON M) 10 MEQ tablet TAKE ONE TABLET BY MOUTH ONCE DAILY   promethazine (PHENERGAN) 25 MG tablet Take 1 tablet (25 mg total) by mouth every 6 (six) hours as needed for nausea.   Semaglutide, 2 MG/DOSE, 8 MG/3ML SOPN Inject 2 mg as directed once a week.   spironolactone (ALDACTONE) 25 MG tablet Take 1 tablet (25 mg total) by mouth daily.   temazepam (RESTORIL) 30 MG capsule TAKE ONE CAPSULE BY MOUTH EVERYDAY AT BEDTIME AS NEEDED FOR SLEEP   traMADol (ULTRAM) 50 MG tablet TAKE TWO TABLETS BY MOUTH three times daily AS NEEDED FOR moderate pain   triamcinolone cream (KENALOG) 0.1 % Apply 1 application topically 2 (two) times daily.   venlafaxine XR (EFFEXOR-XR) 150 MG 24 hr capsule TAKE ONE CAPSULE BY MOUTH EVERY MORNING   vitamin B-12 (CYANOCOBALAMIN) 1000 MCG tablet Take 1,000 mcg by mouth daily. 2500 mcg once daily   vitamin E 180 MG (400 UNITS) capsule Take 400 Units by mouth daily.   [DISCONTINUED] potassium chloride (KLOR-CON 10) 10 MEQ CR tablet Take 1  tablet (10 mEq total) by mouth daily.   No facility-administered encounter medications on file as of 10/28/2021.   Reviewed chart for medication changes ahead of medication coordination call.  BP Readings from Last 3 Encounters:  08/09/21 112/63  07/12/21 120/72  11/16/20 (!) 150/78    Lab Results  Component Value Date   HGBA1C 6.0 07/12/2021     Patient obtains medications through Vials  90 Days    Last adherence delivery included: Temazepam 30 mg 1 capsule at bedtime as needed.  Atorvastatin 10 mg 1 tablet daily Fenofibrate 160 mg: one tablet daily Potassium chloride  10  MEQ: one tablet daily Venlafaxine XR 150 mg: one capsule every morning Freestyle Libre 14 day sensor Tramadol 50 mg 2 tablets 3 times daily as needed Cyclobenzaprine 10 mg: take 1 tablet three times a day as needed  Metoprolol succinate  100 MG take 1 tablet by mouth daily Gabapentin 300 mg 2 capsules 3 times daily  Omeprazole 20 mg 1 capsule twice daily    Patient medications declined: Spironolactone 25 mg 1 tablet daily, pt has plenty on hand Metformin  500 MG: medication discontinued   Patient is due for next adherence delivery on: 11/07/2021   Called patient and reviewed medications and coordinated delivery.   This delivery to include: Temazepam 30 mg 1 capsule at bedtime as needed.  Potassium chloride  10 MEQ: one tablet daily Tramadol 50 mg 2 tablets 3 times daily as needed Cyclobenzaprine 10 mg: take 1 tablet three times a day as needed  Gabapentin 300 mg 2 capsules 3 times daily  Omeprazole 20 mg 1 capsule twice daily   Patient will need a short fill: No short fill needed   Coordinated acute fill : No acute fill needed   Medications declined:  Denied all other medications not due until Nov and Dec.    Confirmed delivery date of 11/07/2021, advised patient that pharmacy will contact them the morning of delivery.  Care Gaps: AWV - scheduled 07/14/2022 Last BP - 112/63 on 08/09/2021  Last A1C - 6.0 on 07/12/2021 Foot exam - never done Eye exam - never done Pap smear - never done Shingrix - never done Covid  - overdue Flu - postponed Mammogram - postponed Hep C Screen - postponed   Star Rating Drugs: Atorvastatin 10 mg - last filled 10/03/2021 90 DS at [facility] Semaglutide '2mg'$ /dose - no fill history  [place] Pharmacist Assistant [phone]

## 2021-11-05 ENCOUNTER — Other Ambulatory Visit: Payer: Self-pay | Admitting: Family Medicine

## 2021-11-06 NOTE — Telephone Encounter (Signed)
Last refill-05/01/21-30 capsules, 5 refills Last VV-09/12/21  No future OV scheduled.

## 2021-11-13 ENCOUNTER — Telehealth: Payer: Self-pay | Admitting: Pharmacist

## 2021-11-13 NOTE — Chronic Care Management (AMB) (Signed)
    Chronic Care Management Pharmacy Assistant   Name: TALITHA DICARLO  MRN: [id number] DOB: [birth year]  Reason for Encounter: Patient assistance renewal application for Ozempic. Patient notified.    [place] Pharmacist Assistant [phone]

## 2021-11-26 ENCOUNTER — Other Ambulatory Visit: Payer: Self-pay | Admitting: Family Medicine

## 2021-11-27 ENCOUNTER — Telehealth: Payer: Self-pay | Admitting: Pharmacist

## 2021-11-27 NOTE — Chronic Care Management (AMB) (Cosign Needed)
Chronic Care Management Pharmacy Assistant   Name: Christina [name]  MRN: [id number] DOB: 25-Dec-1965  Reason for Encounter: Medication Review / Medication Coordination Call   Recent office visits:  None  Recent consult visits:  None  Hospital visits:  None  Medications: Outpatient Encounter Medications as of 11/27/2021  Medication Sig   aspirin 81 MG EC tablet Take 81 mg by mouth daily.   atorvastatin (LIPITOR) 10 MG tablet TAKE ONE TABLET BY MOUTH ONCE DAILY   cholecalciferol (VITAMIN D) 1000 UNITS tablet Take 1,000 Units by mouth daily.   Cinnamon 500 MG TABS Take 2 tablets by mouth daily.   clobetasol (TEMOVATE) 0.05 % external solution Apply 1 application topically 2 (two) times daily.   Continuous Blood Gluc Receiver (FREESTYLE LIBRE 14 DAY READER) DEVI 1 application by Does not apply route every 14 (fourteen) days.   Continuous Blood Gluc Sensor (FREESTYLE LIBRE 2 SENSOR) MISC USE TO check blood glucose AS DIRECTED AND CHANGE sensor every 14 DAYS   [employer] ULTRA THIN LANCETS MISC 1 strip by Does not apply route as directed.   cyclobenzaprine (FLEXERIL) 10 MG tablet TAKE ONE TABLET BY MOUTH THREE TIMES DAILY AS NEEDED FOR muscle SPASMS   desonide (DESOWEN) 0.05 % cream Apply topically 2 (two) times daily.   diclofenac Sodium (VOLTAREN) 1 % GEL Apply 4 g topically 4 (four) times daily.   diltiazem (CARDIZEM CD) 360 MG 24 hr capsule TAKE ONE CAPSULE BY MOUTH ONCE DAILY   diphenhydrAMINE (BENADRYL) 25 mg capsule Take 25 mg by mouth as needed.   EPINEPHrine 0.3 mg/0.3 mL IJ SOAJ injection Inject 0.3 mg into the muscle as needed for anaphylaxis.   fenofibrate 160 MG tablet TAKE ONE TABLET BY MOUTH ONCE DAILY   gabapentin (NEURONTIN) 300 MG capsule TAKE TWO CAPSULES BY MOUTH THREE TIMES DAILY   glucose blood (TRUETEST TEST) test strip Use as instructed   HYDROmorphone (DILAUDID) 4 MG tablet Take 1 tablet (4 mg total) by mouth every 6 (six) hours as needed for severe pain.    levothyroxine (SYNTHROID) 50 MCG tablet TAKE ONE TABLET BY MOUTH EVERY MORNING   metoprolol succinate (TOPROL-XL) 100 MG 24 hr tablet TAKE ONE TABLET BY MOUTH ONCE DAILY WITH A MEAL   mupirocin ointment (BACTROBAN) 2 % Apply topically 3 (three) times daily.   omeprazole (PRILOSEC) 20 MG capsule TAKE ONE CAPSULE BY MOUTH TWICE DAILY   potassium chloride (KLOR-CON M) 10 MEQ tablet TAKE ONE TABLET BY MOUTH ONCE DAILY   promethazine (PHENERGAN) 25 MG tablet Take 1 tablet (25 mg total) by mouth every 6 (six) hours as needed for nausea.   Semaglutide, 2 MG/DOSE, 8 MG/3ML SOPN Inject 2 mg as directed once a week.   spironolactone (ALDACTONE) 25 MG tablet TAKE ONE TABLET BY MOUTH ONCE DAILY   temazepam (RESTORIL) 30 MG capsule TAKE ONE CAPSULE BY MOUTH AT bedtime as needed FOR SLEEP   traMADol (ULTRAM) 50 MG tablet TAKE TWO TABLETS BY MOUTH three times daily AS NEEDED FOR moderate pain   triamcinolone cream (KENALOG) 0.1 % Apply 1 application topically 2 (two) times daily.   venlafaxine XR (EFFEXOR-XR) 150 MG 24 hr capsule TAKE ONE CAPSULE BY MOUTH EVERY MORNING   vitamin B-12 (CYANOCOBALAMIN) 1000 MCG tablet Take 1,000 mcg by mouth daily. 2500 mcg once daily   vitamin E 180 MG (400 UNITS) capsule Take 400 Units by mouth daily.   [DISCONTINUED] potassium chloride (KLOR-CON 10) 10 MEQ CR tablet Take 1 tablet (10  mEq total) by mouth daily.   No facility-administered encounter medications on file as of 11/27/2021.   Reviewed chart for medication changes ahead of medication coordination call.  No OVs, Consults, or hospital visits since last care coordination call/Pharmacist visit. (If appropriate, list visit date, provider name)  No medication changes indicated OR if recent visit, treatment plan here.  BP Readings from Last 3 Encounters:  08/09/21 112/63  07/12/21 120/72  11/16/20 (!) 150/78    Lab Results  Component Value Date   HGBA1C 6.0 07/12/2021       Patient obtains medications through  Vials  90 Days    Last adherence delivery included: Temazepam 30 mg 1 capsule at bedtime as needed.  Potassium chloride  10 MEQ: one tablet daily Tramadol 50 mg 2 tablets 3 times daily as needed Cyclobenzaprine 10 mg: take 1 tablet three times a day as needed  Gabapentin 300 mg 2 capsules 3 times daily  Omeprazole 20 mg 1 capsule twice daily    Patient medications declined: Denied all other medications not due until Nov and Dec.     Patient is due for next adherence delivery on: 12/09/2021   Called patient and reviewed medications and coordinated delivery.   This delivery to include: Temazepam 30 mg 1 capsule at bedtime as needed.  Diltiazem 360 mg 1 capsule daily Tramadol 50 mg 2 tablets 3 times daily as needed Levothyroxine 50 mcg 1 tablet every morning Spironolactone 25 mg 1 tablet once daily   Patient will need a short fill: No short fill needed   Coordinated acute fill : No acute fill needed   Medications declined:  Denied all other medications not due until Dec or Jan   Confirmed delivery date of 12/09/2021, advised patient that pharmacy will contact them the morning of delivery.   Care Gaps: AWV - scheduled 07/14/2022 Last BP - 112/63 on 08/09/2021  Last A1C - 6.0 on 07/12/2021 Foot exam - never done Eye exam - never done Pap smear - never done Shingrix - never done Covid - overdue Flu - postponed Mammogram - postponed Hep C Screen - postponed  Star Rating Drugs: Atorvastatin 10 mg - last filled 10/03/2021 90 DS at [facility] Semaglutide '2mg'$ /dose - no fill history   [place] Pharmacist Assistant [phone]

## 2021-12-25 ENCOUNTER — Telehealth: Payer: Self-pay | Admitting: Pharmacist

## 2021-12-25 NOTE — Chronic Care Management (AMB) (Unsigned)
Chronic Care Management Pharmacy Assistant   Name: Christina [name]  MRN: [id number] DOB: 1965/03/22  Reason for Encounter: Medication Review / Medication Coordination Call   Recent office visits:  None  Recent consult visits:  None  Hospital visits:  None  Medications: Outpatient Encounter Medications as of 12/25/2021  Medication Sig   aspirin 81 MG EC tablet Take 81 mg by mouth daily.   atorvastatin (LIPITOR) 10 MG tablet TAKE ONE TABLET BY MOUTH ONCE DAILY   cholecalciferol (VITAMIN D) 1000 UNITS tablet Take 1,000 Units by mouth daily.   Cinnamon 500 MG TABS Take 2 tablets by mouth daily.   clobetasol (TEMOVATE) 0.05 % external solution Apply 1 application topically 2 (two) times daily.   Continuous Blood Gluc Receiver (FREESTYLE LIBRE 14 DAY READER) DEVI 1 application by Does not apply route every 14 (fourteen) days.   Continuous Blood Gluc Sensor (FREESTYLE LIBRE 2 SENSOR) MISC USE TO check blood glucose AS DIRECTED AND CHANGE sensor every 14 DAYS   [facility] ULTRA THIN LANCETS MISC 1 strip by Does not apply route as directed.   cyclobenzaprine (FLEXERIL) 10 MG tablet TAKE ONE TABLET BY MOUTH THREE TIMES DAILY AS NEEDED FOR muscle SPASMS   desonide (DESOWEN) 0.05 % cream Apply topically 2 (two) times daily.   diclofenac Sodium (VOLTAREN) 1 % GEL Apply 4 g topically 4 (four) times daily.   diltiazem (CARDIZEM CD) 360 MG 24 hr capsule TAKE ONE CAPSULE BY MOUTH ONCE DAILY   diphenhydrAMINE (BENADRYL) 25 mg capsule Take 25 mg by mouth as needed.   EPINEPHrine 0.3 mg/0.3 mL IJ SOAJ injection Inject 0.3 mg into the muscle as needed for anaphylaxis.   fenofibrate 160 MG tablet TAKE ONE TABLET BY MOUTH ONCE DAILY   gabapentin (NEURONTIN) 300 MG capsule TAKE TWO CAPSULES BY MOUTH THREE TIMES DAILY   glucose blood (TRUETEST TEST) test strip Use as instructed   HYDROmorphone (DILAUDID) 4 MG tablet Take 1 tablet (4 mg total) by mouth every 6 (six) hours as needed for severe pain.    levothyroxine (SYNTHROID) 50 MCG tablet TAKE ONE TABLET BY MOUTH EVERY MORNING   metoprolol succinate (TOPROL-XL) 100 MG 24 hr tablet TAKE ONE TABLET BY MOUTH ONCE DAILY WITH A MEAL   mupirocin ointment (BACTROBAN) 2 % Apply topically 3 (three) times daily.   omeprazole (PRILOSEC) 20 MG capsule TAKE ONE CAPSULE BY MOUTH TWICE DAILY   potassium chloride (KLOR-CON M) 10 MEQ tablet TAKE ONE TABLET BY MOUTH ONCE DAILY   promethazine (PHENERGAN) 25 MG tablet Take 1 tablet (25 mg total) by mouth every 6 (six) hours as needed for nausea.   Semaglutide, 2 MG/DOSE, 8 MG/3ML SOPN Inject 2 mg as directed once a week.   spironolactone (ALDACTONE) 25 MG tablet TAKE ONE TABLET BY MOUTH ONCE DAILY   temazepam (RESTORIL) 30 MG capsule TAKE ONE CAPSULE BY MOUTH AT bedtime as needed FOR SLEEP   traMADol (ULTRAM) 50 MG tablet TAKE TWO TABLETS BY MOUTH three times daily AS NEEDED FOR moderate pain   triamcinolone cream (KENALOG) 0.1 % Apply 1 application topically 2 (two) times daily.   venlafaxine XR (EFFEXOR-XR) 150 MG 24 hr capsule TAKE ONE CAPSULE BY MOUTH EVERY MORNING   vitamin B-12 (CYANOCOBALAMIN) 1000 MCG tablet Take 1,000 mcg by mouth daily. 2500 mcg once daily   vitamin E 180 MG (400 UNITS) capsule Take 400 Units by mouth daily.   [DISCONTINUED] potassium chloride (KLOR-CON 10) 10 MEQ CR tablet Take 1 tablet (10  mEq total) by mouth daily.   No facility-administered encounter medications on file as of 12/25/2021.   Reviewed chart for medication changes ahead of medication coordination call.  BP Readings from Last 3 Encounters:  08/09/21 112/63  07/12/21 120/72  11/16/20 (!) 150/78    Lab Results  Component Value Date   HGBA1C 6.0 07/12/2021     Patient obtains medications through [facility]  90 Days    Last adherence delivery included: Temazepam 30 mg 1 capsule at bedtime as needed.  Diltiazem 360 mg 1 capsule daily Tramadol 50 mg 2 tablets 3 times daily as needed Levothyroxine 50 mcg 1 tablet  every morning    Patient medications declined: Spironolactone (not taking) Denied all other medications not due until Dec or Jan      Patient is due for next adherence delivery on: 01/07/2022   Called patient and reviewed medications and coordinated delivery.   This delivery to include: Temazepam 30 mg 1 capsule at bedtime as needed.  Metoprolol ER 100 mg: 1 tablet daily Tramadol 50 mg: 2 tablet three times daily as needed Fenofibrate 160 mg: 1 tablet daily Atorvastatin 10 mg: 1 tablet daily Venlafaxine ER 150 mg: 1 capsule daily Freestyle sensors: 1 every 14 days   Patient will need a short fill: No short fill needed   Coordinated acute fill : No acute fill needed   Medications declined:  Spironolactone (not taking) Denied all other medications not due until Jan    Confirmed delivery date of 01/07/2022, advised patient that pharmacy will contact them the morning of delivery.  Notes: Patient's insurance will be changing to [facility] on 01/27/22, she will attempt to send a copy through [place] when her card arrives.   Patient also received a letter from [employer], she plans to send proof of income and anything else the letter is requesting to [name] through My chart or will mail if she [place] unable to upload through [place].   Care Gaps: AWV - scheduled 07/14/2022 Last BP - 112/63 on 08/09/2021  Last A1C - 6.0 on 07/12/2021 Foot exam - never done Eye exam - never done Papsmear - never done Shingrix - never done Covid - overdue Flu - postponed Mammorgram - postponed Hep C screen - postponed  Star Rating Drugs: Atorvastatin 10 mg - last filled 10/03/2021 90 DS at [facility] Semaglutide '2mg'$ /dose - no fill history   [place] Pharmacist Assistant [phone]

## 2021-12-31 ENCOUNTER — Telehealth: Payer: PPO

## 2021-12-31 ENCOUNTER — Other Ambulatory Visit: Payer: Self-pay | Admitting: Family Medicine

## 2021-12-31 DIAGNOSIS — E1169 Type 2 diabetes mellitus with other specified complication: Secondary | ICD-10-CM

## 2022-01-01 NOTE — Telephone Encounter (Signed)
Pt LOV was on 09/12/21 Last refill done on 09/06/2021 Please advise

## 2022-01-02 ENCOUNTER — Telehealth: Payer: Self-pay | Admitting: Pharmacist

## 2022-01-02 NOTE — Progress Notes (Unsigned)
Chronic Care Management Pharmacy Note  01/02/2022 Name:  Christina [name] MRN:  [id number] DOB:  Mar 01, 1965   Summary: BP not at goal < 140/90 per recent home readings Pt has not started spironolactone A1c at goal < 7% and pt is having some low readings Nephrologist stopped a few of her medications  Recommendations/Changes made from today's visit: -Recommended using spironolactone for days with higher BP readings -Recommended decreasing metformin to 1 tablet daily -Requested diclofenac gel prescription to replace tablets   Plan: Follow up after discussion with PCP  Subjective: Christina [name] is an 56 y.o. year old female who is a primary patient of Laurey Morale, MD.  The CCM team was consulted for assistance with disease management and care coordination needs.    Engaged with patient by telephone for follow up visit in response to provider referral for pharmacy case management and/or care coordination services.   Consent to Services:  The patient was given information about Chronic Care Management services, agreed to services, and gave verbal consent prior to initiation of services.  Please see initial visit note for detailed documentation.   Patient Care Team: Laurey Morale, MD as PCP - General Viona Gilmore, [facility] as Pharmacist (Pharmacist)  Recent office visits: 09/12/2021 Alysia Penna MD: Patient presented for video visit for CKD follow up. Discontinued metformin and diclofenac tablets. Follow up A1c in 90 days.  07/12/2021 Alysia Penna MD - Patient was seen for Type 2 diabetes mellitus with other specified complication, without long-term current use of insulin and additional issues. Discontinued Keflex. No follow up noted.    07/09/2021 Rolene Arbour LPN - Encounter for Medicare annual wellness exam.  Recent consult visits: 09/17/21 Cay Schillings, MD (nephrology): Patient presented for hematuria and CT of abdomen and pelvis.   08/30/21 Cay Schillings, MD (nephrology): Patient  presented for initial visit. D/c'd diclofeanc, Celebrex and Bactrim.  08/09/21 Audelia Hives, DO (plastic surgery): Patient presented for urinary incontinence for evaluation of pannus for surgery. Recommended general surgery consult for gastric bypass.  Hospital visits: None in previous 6 months  Objective:  Lab Results  Component Value Date   CREATININE 1.36 (H) 07/12/2021   BUN 27 (H) 07/12/2021   GFR 43.43 (L) 07/12/2021   GFRNONAA 94.60 09/11/2009   GFRAA  05/03/2009    >60        The eGFR has been calculated using the MDRD equation. This calculation has not been validated in all clinical situations. eGFR's persistently <60 mL/min signify possible Chronic Kidney Disease.   NA 140 07/12/2021   K 4.7 07/12/2021   CALCIUM 9.3 07/12/2021   CO2 29 07/12/2021   GLUCOSE 88 07/12/2021    Lab Results  Component Value Date/Time   HGBA1C 6.0 07/12/2021 09:19 AM   HGBA1C 7.2 (A) 11/16/2020 01:24 PM   HGBA1C 8.1 (H) 07/05/2020 01:21 PM   GFR 43.43 (L) 07/12/2021 09:19 AM   GFR 64.65 11/16/2020 02:32 PM   MICROALBUR 2.5 (H) 02/21/2014 02:22 PM   MICROALBUR 1.7 10/05/2013 10:42 AM    Last diabetic Eye exam: No results found for: "HMDIABEYEEXA"  Last diabetic Foot exam: No results found for: "HMDIABFOOTEX"   Lab Results  Component Value Date   CHOL 166 07/12/2021   HDL 48.50 07/12/2021   LDLCALC 97 02/10/2020   LDLDIRECT 87.0 07/12/2021   TRIG 305.0 (H) 07/12/2021   CHOLHDL 3 07/12/2021       Latest Ref Rng & Units 07/12/2021    9:19 AM 02/10/2020  3:35 PM 05/02/2019    2:51 PM  Hepatic Function  Total Protein 6.0 - 8.3 g/dL 7.0  7.1  7.1   Albumin 3.5 - 5.2 g/dL 4.3   4.6   AST 0 - 37 U/L _0 ALT 0 - 35 U/L _1 Alk Phosphatase 39 - 117 U/L 34   48   Total Bilirubin 0.2 - 1.2 mg/dL 0.3  0.4  0.4   Bilirubin, Direct 0.0 - 0.3 mg/dL 0.1  0.1  0.1     Lab Results  Component Value Date/Time   TSH 2.08 11/16/2020 02:32 PM   TSH 2.40  02/10/2020 03:35 PM   FREET4 1.00 11/16/2020 02:32 PM   FREET4 1.2 02/10/2020 03:35 PM       Latest Ref Rng & Units 07/12/2021    9:19 AM 11/16/2020    2:32 PM 02/10/2020    3:35 PM  CBC  WBC 4.0 - 10.5 K/uL 5.8  7.0  7.0   Hemoglobin 12.0 - 15.0 g/dL 12.7  13.0  12.1   Hematocrit 36.0 - 46.0 % 38.7  38.8  36.5   Platelets 150.0 - 400.0 K/uL 199.0  186.0  184     No results found for: "VD25OH"  Clinical ASCVD: No  The 10-year ASCVD risk score (Arnett DK, et al., 2019) is: 7.7%   Values used to calculate the score:     Age: 71 years     Sex: Female     Is Non-Hispanic African American: No     Diabetic: Yes     Tobacco smoker: No     Systolic Blood Pressure: 185 mmHg     Is BP treated: Yes     HDL Cholesterol: 48.5 mg/dL     Total Cholesterol: 166 mg/dL       07/12/2021   11:20 AM 07/09/2021    9:00 AM 11/16/2020    3:29 PM  Depression screen PHQ 2/9  Decreased Interest 1 0 3  Down, Depressed, Hopeless 1 0 2  PHQ - 2 Score 2 0 5  Altered sleeping 3  3  Tired, decreased energy 3  2  Change in appetite 0  3  Feeling bad or failure about yourself  1  1  Trouble concentrating 3  1  Moving slowly or fidgety/restless 3  0  Suicidal thoughts 0  0  PHQ-9 Score 15  15  Difficult doing work/chores   Somewhat difficult      Social History   Tobacco Use  Smoking Status Former  Smokeless Tobacco Never   BP Readings from Last 3 Encounters:  08/09/21 112/63  07/12/21 120/72  11/16/20 (!) 150/78   Pulse Readings from Last 3 Encounters:  08/09/21 77  07/12/21 69  11/16/20 87   Wt Readings from Last 3 Encounters:  08/09/21 214 lb 6.4 oz (97.3 kg)  07/12/21 213 lb (96.6 kg)  07/09/21 210 lb (95.3 kg)   BMI Readings from Last 3 Encounters:  08/09/21 39.21 kg/m  07/12/21 38.96 kg/m  07/09/21 38.41 kg/m    Assessment/Interventions: Review of patient past medical history, allergies, medications, health status, including review of consultants reports, laboratory  and other test data, was performed as part of comprehensive evaluation and provision of chronic care management services.   SDOH:  (Social Determinants of Health) assessments and interventions performed: Yes *** SDOH Interventions    Flowsheet Row Clinical Support from 07/09/2021 in [place] at [place]  Clinical Support from 06/14/2020 in [place] at [place] Management from 09/28/2019 in [place] at [place] Management from 08/29/2019 in [place] at [place] Management from 08/10/2019 in [place] at [place] Management from 07/06/2019 in [place] at [place] Interventions Intervention Not Indicated Intervention Not Indicated -- -- -- --  Housing Interventions Intervention Not Indicated Intervention Not Indicated -- -- -- --  Transportation Interventions Intervention Not Indicated Intervention Not Indicated Intervention Not Indicated -- -- --  Depression Interventions/Treatment  -- -- -- -- -- Currently on Treatment  [Patient to schedule follow up visit with PCP within next week.]  Financial Strain Interventions Intervention Not Indicated -- Other (Comment)  [Worked on patient assistance for medications] Other (Comment)  [Reviewed NovoNordisk patient assistance application and items required to be turned in. Patient to mail in completed application to [place] office.] Other (Comment)  [Patient does not qualify for Extra Help. Mailed Ozempic patient assistance forms.] Other (Comment)  [Patient does not qualify for Extra Help program (makes about +$37,000 combined income with spouse). Aiding patient with patient assistance forms for Ozempic.]  Physical Activity Interventions Intervention Not Indicated Intervention Not Indicated -- -- -- --  Stress Interventions Intervention Not Indicated Intervention Not Indicated -- -- -- --  Social Connections  Interventions Intervention Not Indicated Intervention Not Indicated -- -- -- --      SDOH Screenings   Food Insecurity: No Food Insecurity (07/09/2021)  Housing: Low Risk  (07/09/2021)  Transportation Needs: No Transportation Needs (07/09/2021)  Alcohol Screen: Low Risk  (07/09/2021)  Depression (PHQ2-9): High Risk (07/12/2021)  Financial Resource Strain: Low Risk  (07/09/2021)  Physical Activity: Inactive (07/09/2021)  Social Connections: Moderately Isolated (07/09/2021)  Stress: No Stress Concern Present (07/09/2021)  Tobacco Use: Medium Risk (09/12/2021)    CCM Care Plan  Allergies  Allergen Reactions   Antihistamines, Loratadine-Type Other (See Comments)    Burns skin   Bee Venom Swelling   Clindamycin/Lincomycin Itching   Codeine Other (See Comments)    Hallucinations    Doxycycline Other (See Comments)    Hallucinations    Lisinopril     REACTION: facial swelling   Metronidazole     REACTION: rash   Ondansetron Hcl     REACTION: rash   Tylox [Oxycodone-Acetaminophen] Other (See Comments)    Hallucinations    Vancomycin     REACTION: rash   Albuterol Palpitations    Tachycardia   Benadryl [Diphenhydramine] Nausea And Vomiting   Lidocaine Rash   Oxycodone-Acetaminophen Rash    Medications Reviewed Today     Reviewed by Laurey Morale, MD (Physician) on 09/12/21 at [phone]  Med List Status: <None>   Medication Order Taking? Sig Documenting Provider Last Dose Status Informant  aspirin 81 MG EC tablet [id number] Yes Take 81 mg by mouth daily. [provider] Taking Active   atorvastatin (LIPITOR) 10 MG tablet [id number] Yes TAKE ONE TABLET BY MOUTH ONCE DAILY Laurey Morale, MD Taking Active   cholecalciferol (VITAMIN D) 1000 UNITS tablet [id number] Yes Take 1,000 Units by mouth daily. [provider] Taking Active   Cinnamon 500 MG TABS [id number] Yes Take 2 tablets by mouth daily. [provider] Taking Active   clobetasol (TEMOVATE) 0.05 % external  solution [id number] Yes Apply 1 application topically 2 (two) times daily. Laurey Morale, MD Taking Active   Continuous Blood Gluc Receiver (FREESTYLE [place]) DEVI [id number] Yes 1 application by Does not apply route every 14 (fourteen) days. Laurey Morale, MD Taking Active   Continuous Blood Gluc Sensor (FREESTYLE LIBRE 2 SENSOR) [place] [id number] Yes USE TO check blood glucose AS DIRECTED AND CHANGE sensor every 14 DAYS Laurey Morale, MD Taking Active   [facility] ULTRA THIN LANCETS MISC [id number] Yes 1 strip by Does not apply route as directed. Laurey Morale, MD Taking Active   cyclobenzaprine (FLEXERIL) 10 MG tablet [id number] Yes TAKE ONE TABLET BY MOUTH THREE TIMES DAILY AS NEEDED FOR muscle SPASMS Laurey Morale, MD Taking Active   desonide (DESOWEN) 0.05 % cream [id number] Yes Apply topically 2 (two) times daily. [provider] Taking Active   diclofenac (VOLTAREN) 75 MG EC tablet [id number] Yes TAKE ONE TABLET BY MOUTH TWICE DAILY Laurey Morale, MD Taking Active   diltiazem (CARDIZEM CD) 360 MG 24 hr capsule [id number] Yes TAKE ONE CAPSULE BY MOUTH ONCE DAILY Laurey Morale, MD Taking Active   diphenhydrAMINE (BENADRYL) 25 mg capsule [id number] Yes Take 25 mg by mouth as needed. [provider] Taking Active   EPINEPHrine 0.3 mg/0.3 mL IJ SOAJ injection [id number] Yes Inject 0.3 mg into the muscle as needed for anaphylaxis. Laurey Morale, MD Taking Active   fenofibrate 160 MG tablet [id number] Yes Take 1 tablet (160 mg total) by mouth daily. Laurey Morale, MD Taking Active   gabapentin (NEURONTIN) 300 MG capsule [id number] Yes TAKE TWO CAPSULES BY MOUTH THREE TIMES DAILY Laurey Morale, MD Taking Active   glucose blood (TRUETEST TEST) test strip [id number] Yes Use as instructed Laurey Morale, MD Taking Active   HYDROmorphone (DILAUDID) 4 MG tablet [id number] Yes Take 1 tablet (4 mg total) by mouth every 6 (six) hours as needed for severe pain. Laurey Morale, MD Taking Active    levothyroxine (SYNTHROID) 50 MCG tablet [id number] Yes TAKE ONE TABLET BY MOUTH EVERY MORNING Laurey Morale, MD Taking Active   metFORMIN (GLUCOPHAGE-XR) 500 MG 24 hr tablet [id number] Yes TAKE 1 TABLET BY MOUTH TWICE A DAY WITH MEAL Laurey Morale, MD Taking Active   metoprolol succinate (TOPROL-XL) 100 MG 24 hr tablet [id number] Yes TAKE ONE TABLET BY MOUTH ONCE DAILY WITH A MEAL Laurey Morale, MD Taking Active   mupirocin ointment (BACTROBAN) 2 % [id number] Yes Apply topically 3 (three) times daily. Laurey Morale, MD Taking Active   omeprazole (PRILOSEC) 20 MG capsule [id number] Yes TAKE ONE CAPSULE BY MOUTH TWICE DAILY Laurey Morale, MD Taking Active     Discontinued 09/22/11 [phone] (Reorder)   potassium chloride (KLOR-CON M) 10 MEQ tablet [id number] Yes Take 1 tablet (10 mEq total) by mouth daily. Laurey Morale, MD Taking Active   promethazine (PHENERGAN) 25 MG tablet [id number] Yes Take 1 tablet (25 mg total) by mouth every 6 (six) hours as needed for nausea. Laurey Morale, MD Taking Active   Semaglutide, 2 MG/DOSE, 8 MG/3ML [place] [id number] Yes Inject 2 mg as directed once a week. Laurey Morale, MD Taking Active   spironolactone (ALDACTONE) 25 MG tablet [id number] Yes Take 1 tablet (25 mg total) by mouth daily. Laurey Morale, MD Taking Active   temazepam (RESTORIL) 30 MG capsule [id number] Yes TAKE ONE CAPSULE BY MOUTH EVERYDAY AT BEDTIME AS NEEDED FOR SLEEP Laurey Morale, MD Taking Active   traMADol (ULTRAM) 50 MG tablet [id number] Yes TAKE TWO TABLETS BY MOUTH three times daily AS NEEDED  FOR moderate pain Laurey Morale, MD Taking Active   triamcinolone cream (KENALOG) 0.1 % [id number] Yes Apply 1 application topically 2 (two) times daily. Laurey Morale, MD Taking Active   venlafaxine XR (EFFEXOR-XR) 150 MG 24 hr capsule [id number] Yes Take 1 capsule (150 mg total) by mouth every morning. Laurey Morale, MD Taking Active   vitamin B-12 (CYANOCOBALAMIN) 1000 MCG tablet [id number] Yes Take 1,000 mcg  by mouth daily. 2500 mcg once daily [provider] Taking Active Self  vitamin E 180 MG (400 UNITS) capsule [id number] Yes Take 400 Units by mouth daily. [provider] Taking Active Self            Patient Active Problem List   Diagnosis Date Noted   Stage 3b chronic kidney disease ([place]) 07/15/2021   Urinary incontinence 11/16/2020   Hyperlipidemia    Depression with anxiety 07/13/2019   Psoriasis 10/29/2016   Morbid obesity ([place]) 01/04/2016   Hypothyroidism 04/05/2014   Type 2 diabetes mellitus with other specified complication ([place]) 62/37/6283   NECK PAIN 11/28/2009   BRACHIAL NEURITIS OR RADICULITIS NOS 11/28/2009   B12 deficiency 09/14/2009   HYPERTRIGLYCERIDEMIA 09/14/2009   Diabetic neuropathy ([place]) 09/11/2009   ECZEMA 06/07/2009   Anxiety state 07/26/2008   [place] DISEASE, LUMBAR 06/22/2007   ARTHRITIS 06/11/2007   LOW BACK PAIN 12/28/2006   Essential hypertension 09/11/2006   Asthma 09/11/2006   GERD 09/11/2006   History of colonic polyps 09/11/2006    Immunization History  Administered Date(s) Administered   Influenza Whole 12/27/2004, 11/27/2008   Influenza,inj,Quad PF,6+ Mos 10/08/2012, 10/05/2013, 10/06/2014, 01/04/2016, 10/29/2016, 12/23/2017, 11/30/2018, 11/07/2019, 11/16/2020   PFIZER(Purple Top)SARS-COV-2 Vaccination 05/20/2019, 06/15/2019   Pneumococcal Polysaccharide-23 01/16/2020   Tdap 04/01/2017   Patient reports she is feeling more run down lately. She went to a meeting at school for her granddaughter and this was stressful. Patient's husband is now on third shift and he goes in at 11pm and gets in at 7am. Patient hasn't been sleeping well but not a lot of changes with her sleep recently.  Patient was told to stop 3 of her medications when she met with the nephrologist. She has stopped them and removed them from her medication list. Patient was only on the Bactrim for a face rash but that hasn't been clearing up and isn't getting  worse either. She will meet with dermatology to discuss.  Patient is having some low blood sugars all month. Patient reports she eats when she is hungry and is not hungry as much. Patient is seeing some lower blood sugar readings at least 4 times a week. Patient is seeing some readings as low as 50.  -remove spiro from med list?  -BP?  -Bgs? Any lows any more?  -schedule follow up with PCP for repeat A1c  Patient reports she is preparing for her daughter to come to live with her. She is supposed to have support with her daughter when she does come to live there. Patient reports the trip took a toll on her.  Patient report she is changing her insurance to [place]. She has never had [employer] before and is hopeful that the cost of her medications will be a lot less.  Patient reports she has been having metformin 1 a day and she is taking it at night and started this. Patient started back on it about 2 weeks after stopping it.    Patient is going to try to do some exercises. She mostly sits in the  chair and feels like she wants to make a change.  Patient is moving things around and couldn't find her BP cuff while on the phone.   Conditions to be addressed/monitored:  Hypertension, Hyperlipidemia, Diabetes, GERD, Hypothyroidism, Anxiety, Osteoarthritis and insomnia, vitamin B12 deficiency  Conditions addressed this visit: Diabetes, hypertension  There are no care plans that you recently modified to display for this patient.   Medication Assistance:  Ozempic obtained through NovoNordisk medication assistance program.  Enrollment ends 01/26/22  Compliance/Adherence/Medication fill history: Care Gaps: Foot exam, eye exam, PAP smear, shingrix, eye exam, COVID booster, Prevnar20, influenza, mammogram, Hep C screening Last BP - 112/63 on 08/09/2021 Last A1C - 6.0 on 07/12/2021  Star-Rating Drugs: Atorvastatin 10 mg - last filled 10/03/2021 90 DS at [facility] (due for delivery 01/07/22) Ozempic  2 mg - getting from PAP  Patient's preferred pharmacy is:  [facility] - [place], [place] - [address] [place] [address] Phone: [phone] Fax: [phone]  Uses pill box? Yes - in AM/PM - night pills in old pill bottles (10 bottles), keeping other pills (out of sight) Pt endorses 100% compliance  We discussed: Benefits of medication synchronization, packaging and delivery as well as enhanced pharmacist oversight with [facility]. Patient decided to: Utilize [facility] pharmacy for medication synchronization, packaging and delivery  Care Plan and Follow Up Patient Decision:  Patient agrees to Care Plan and Follow-up.  Plan: The care management team will reach out to the patient again over the next 7 days.  [name], PharmD, [place] Pharmacist [place] at [place]

## 2022-01-02 NOTE — Chronic Care Management (AMB) (Signed)
    Chronic Care Management Pharmacy Assistant   Name: Christina [name]  MRN: [id number] DOB: 18-Sep-1965  01/03/2022 APPOINTMENT REMINDER  Called [name], No answer, left message of appointment on 01/03/2022 at 12:00 via telephone visit with [name], Pharm D. Notified to have all medications, supplements, blood pressure and/or blood sugar logs available during appointment and to return call if need to reschedule.  Care Gaps: AWV - scheduled 07/11/2022 Last BP - 112/63 on 08/09/2021 Last A1C - 6.0 on 07/12/2021 Foot exam - never done Eye exam - never done Pap smear - never done Shingrix - never done Covid - overdue Flu - postponed Mammorgram - postponed Hep C Screen - postponed  Star Rating Drug: Atorvastatin 10 mg - last filled 10/03/2021 90 DS at [facility] (due for delivery 01/07/22) Ozempic 2 mg - no fill history  Any gaps in medications fill history? No  [name]  Catering manager [phone]

## 2022-01-03 ENCOUNTER — Other Ambulatory Visit: Payer: Self-pay

## 2022-01-03 ENCOUNTER — Ambulatory Visit (INDEPENDENT_AMBULATORY_CARE_PROVIDER_SITE_OTHER): Payer: PPO | Admitting: Pharmacist

## 2022-01-03 ENCOUNTER — Encounter: Payer: Self-pay | Admitting: Family Medicine

## 2022-01-03 DIAGNOSIS — I10 Essential (primary) hypertension: Secondary | ICD-10-CM

## 2022-01-03 DIAGNOSIS — E1169 Type 2 diabetes mellitus with other specified complication: Secondary | ICD-10-CM

## 2022-01-03 MED ORDER — EPINEPHRINE 0.3 MG/0.3ML IJ SOAJ: 0.3000 mg | INTRAMUSCULAR | 2 refills | Status: AC | PRN

## 2022-01-09 NOTE — Patient Instructions (Signed)
Hi Terrah,  Keep on checking your blood pressure and try to bring your cuff to your appointment with Dr. Sarajane Jews!  Please reach out to me if you have any questions or need anything!  Best, Maddie  [name], PharmD, [place] Pharmacist [facility]

## 2022-01-17 ENCOUNTER — Encounter: Payer: Self-pay | Admitting: Family Medicine

## 2022-01-17 ENCOUNTER — Ambulatory Visit (INDEPENDENT_AMBULATORY_CARE_PROVIDER_SITE_OTHER): Payer: PPO | Admitting: Family Medicine

## 2022-01-17 VITALS — BP 130/78 | HR 85 | Temp 98.0°F | Wt 204.0 lb

## 2022-01-17 DIAGNOSIS — G8929 Other chronic pain: Secondary | ICD-10-CM | POA: Diagnosis not present

## 2022-01-17 DIAGNOSIS — E1169 Type 2 diabetes mellitus with other specified complication: Secondary | ICD-10-CM

## 2022-01-17 DIAGNOSIS — M5440 Lumbago with sciatica, unspecified side: Secondary | ICD-10-CM | POA: Diagnosis not present

## 2022-01-17 DIAGNOSIS — F418 Other specified anxiety disorders: Secondary | ICD-10-CM | POA: Diagnosis not present

## 2022-01-17 DIAGNOSIS — J4520 Mild intermittent asthma, uncomplicated: Secondary | ICD-10-CM | POA: Diagnosis not present

## 2022-01-17 DIAGNOSIS — I10 Essential (primary) hypertension: Secondary | ICD-10-CM

## 2022-01-17 DIAGNOSIS — Z23 Encounter for immunization: Secondary | ICD-10-CM | POA: Diagnosis not present

## 2022-01-17 DIAGNOSIS — N1832 Chronic kidney disease, stage 3b: Secondary | ICD-10-CM

## 2022-01-17 DIAGNOSIS — M5442 Lumbago with sciatica, left side: Secondary | ICD-10-CM

## 2022-01-17 LAB — POCT GLYCOSYLATED HEMOGLOBIN (HGB A1C): Hemoglobin A1C: 6.5 % — AB (ref 4.0–5.6)

## 2022-01-17 NOTE — Addendum Note (Signed)
Addended by: Wyvonne Lenz on: 01/17/2022 10:57 AM   Modules accepted: Orders

## 2022-01-17 NOTE — Addendum Note (Signed)
Addended by: Wyvonne Lenz on: 01/17/2022 11:48 AM   Modules accepted: Orders

## 2022-01-17 NOTE — Progress Notes (Signed)
   Subjective:    Patient ID: Christina [name], female    DOB: 1965-10-08, 56 y.o.   MRN: [id number]  HPI Here to follow up on some issues. She feels well in general. Her A1c is 6.5% today. She sees Dr. Cay Schillings at [facility] for her CKD, and she has taken Merlina off all NSAID's. She wanted to stop the Metformin as well, but Bindu says her glucoses jumped high. Now she has compromised by taking Metformin once a day. Her BP is stable. She is taking Ozempic, and she has lost 10 lbs since July. Her asthma has been stable.    Review of Systems  Constitutional: Negative.   Respiratory: Negative.    Cardiovascular: Negative.   Gastrointestinal: Negative.   Genitourinary: Negative.   Neurological: Negative.        Objective:   Physical Exam Constitutional:      Comments: In her wheelchair   Cardiovascular:     Rate and Rhythm: Normal rate and regular rhythm.     Pulses: Normal pulses.     Heart sounds: Normal heart sounds.  Pulmonary:     Effort: Pulmonary effort is normal.     Breath sounds: Normal breath sounds.  Neurological:     Mental Status: She is alert.           Assessment & Plan:  She is doing well with her HTN, CKD, back pain, depression with anxiety, and asthma. Given a flu shot today.  Alysia Penna, MD

## 2022-01-22 NOTE — Chronic Care Management (AMB) (Signed)
Spoke with [employer] at [facility]. Patient has been approved for Ozempic through 01/27/2023. Order will process 01/27/2022, allow 10-14 days for shipping. Medication will ship to to the office. Patient notified.

## 2022-01-24 ENCOUNTER — Other Ambulatory Visit: Payer: Self-pay | Admitting: Family Medicine

## 2022-01-24 DIAGNOSIS — G8929 Other chronic pain: Secondary | ICD-10-CM

## 2022-01-26 DIAGNOSIS — E1140 Type 2 diabetes mellitus with diabetic neuropathy, unspecified: Secondary | ICD-10-CM

## 2022-01-26 DIAGNOSIS — I10 Essential (primary) hypertension: Secondary | ICD-10-CM

## 2022-01-28 ENCOUNTER — Telehealth: Payer: Self-pay | Admitting: Pharmacist

## 2022-01-28 NOTE — Progress Notes (Signed)
Reason for Encounter: Medication coordination and delivery  Contacted patient on 01/28/2022 to discuss medications   Recent office visits:  01/17/2022 Alysia Penna MD - Patient was seen for Type 2 diabetes mellitus with other specified complication, without long-term current use of insulin and additional concerns. No medication changes.   Recent consult visits:  None  Hospital visits:  None  Medications: Outpatient Encounter Medications as of 01/28/2022  Medication Sig   aspirin 81 MG EC tablet Take 81 mg by mouth daily.   atorvastatin (LIPITOR) 10 MG tablet TAKE ONE TABLET BY MOUTH ONCE DAILY   cholecalciferol (VITAMIN D) 1000 UNITS tablet Take 1,000 Units by mouth daily.   Cinnamon 500 MG TABS Take 2 tablets by mouth daily.   clobetasol (TEMOVATE) 0.05 % external solution Apply 1 application topically 2 (two) times daily.   Continuous Blood Gluc Receiver (FREESTYLE LIBRE 14 DAY READER) DEVI 1 application by Does not apply route every 14 (fourteen) days.   Continuous Blood Gluc Sensor (FREESTYLE LIBRE 2 SENSOR) MISC USE TO check blood glucose AS DIRECTED AND CHANGE sensor every 14 DAYS   cyclobenzaprine (FLEXERIL) 10 MG tablet TAKE ONE TABLET BY MOUTH three times daily AS NEEDED FOR muscle SPASMS   desonide (DESOWEN) 0.05 % cream Apply topically 2 (two) times daily.   diclofenac Sodium (VOLTAREN) 1 % GEL Apply 4 g topically 4 (four) times daily.   diltiazem (CARDIZEM CD) 360 MG 24 hr capsule TAKE ONE CAPSULE BY MOUTH ONCE DAILY   diphenhydrAMINE (BENADRYL) 25 mg capsule Take 25 mg by mouth as needed.   EPINEPHrine 0.3 mg/0.3 mL IJ SOAJ injection Inject 0.3 mg into the muscle as needed for anaphylaxis.   fenofibrate 160 MG tablet TAKE ONE TABLET BY MOUTH ONCE DAILY   gabapentin (NEURONTIN) 300 MG capsule TAKE TWO CAPSULES BY MOUTH THREE TIMES DAILY   HYDROmorphone (DILAUDID) 4 MG tablet Take 1 tablet (4 mg total) by mouth every 6 (six) hours as needed for severe pain.   levothyroxine  (SYNTHROID) 50 MCG tablet TAKE ONE TABLET BY MOUTH EVERY MORNING   metFORMIN (GLUCOPHAGE-XR) 500 MG 24 hr tablet Take 500 mg by mouth daily with breakfast.   metoprolol succinate (TOPROL-XL) 100 MG 24 hr tablet TAKE ONE TABLET BY MOUTH ONCE DAILY WITH A MEAL   mupirocin ointment (BACTROBAN) 2 % Apply topically 3 (three) times daily.   omeprazole (PRILOSEC) 20 MG capsule TAKE ONE CAPSULE BY MOUTH TWICE DAILY   potassium chloride (KLOR-CON M) 10 MEQ tablet TAKE ONE TABLET BY MOUTH ONCE DAILY   promethazine (PHENERGAN) 25 MG tablet Take 1 tablet (25 mg total) by mouth every 6 (six) hours as needed for nausea.   Semaglutide, 2 MG/DOSE, 8 MG/3ML SOPN Inject 2 mg as directed once a week.   temazepam (RESTORIL) 30 MG capsule TAKE ONE CAPSULE BY MOUTH AT bedtime as needed FOR SLEEP   traMADol (ULTRAM) 50 MG tablet TAKE TWO TABLETS BY MOUTH three times daily AS NEEDED FOR moderate pain   triamcinolone cream (KENALOG) 0.1 % Apply 1 application topically 2 (two) times daily.   venlafaxine XR (EFFEXOR-XR) 150 MG 24 hr capsule TAKE ONE CAPSULE BY MOUTH EVERY MORNING   vitamin B-12 (CYANOCOBALAMIN) 1000 MCG tablet Take 1,000 mcg by mouth daily. 2500 mcg once daily   vitamin E 180 MG (400 UNITS) capsule Take 400 Units by mouth daily.   [DISCONTINUED] potassium chloride (KLOR-CON 10) 10 MEQ CR tablet Take 1 tablet (10 mEq total) by mouth daily.   No  facility-administered encounter medications on file as of 01/28/2022.   BP Readings from Last 3 Encounters:  01/17/22 130/78  08/09/21 112/63  07/12/21 120/72    Pulse Readings from Last 3 Encounters:  01/17/22 85  08/09/21 77  07/12/21 69    Lab Results  Component Value Date/Time   HGBA1C 6.5 (A) 01/17/2022 10:57 AM   HGBA1C 6.0 07/12/2021 09:19 AM   HGBA1C 7.2 (A) 11/16/2020 01:24 PM   HGBA1C 8.1 (H) 07/05/2020 01:21 PM   Lab Results  Component Value Date   CREATININE 1.36 (H) 07/12/2021   BUN 27 (H) 07/12/2021   GFR 43.43 (L) 07/12/2021    GFRNONAA 94.60 09/11/2009   GFRAA  05/03/2009    >60        The eGFR has been calculated using the MDRD equation. This calculation has not been validated in all clinical situations. eGFR's persistently <60 mL/min signify possible Chronic Kidney Disease.   NA 140 07/12/2021   K 4.7 07/12/2021   CALCIUM 9.3 07/12/2021   CO2 29 07/12/2021   Last adherence delivery date:01/07/2022      Patient is due for next adherence delivery on: 02/06/2022  Spoke with patient on 01/28/2022 reviewed medications and coordinated delivery.  This delivery to include: Vials  90 Days  Temazepam 30 mg 1 capsule at bedtime as needed.   Tramadol 50 mg: 2 tablet three times daily as needed  Cyclobenzaprine 10 mg - 1 tablet three times daily as needed Gabapentin 300 mg - 2 caps three times daily Omeprazole 20 mg - 1 cap twice daily Potassium 10 meq - 1 tablet daily  Patient declined the following medications this month: All other medications, delivered on previous dates, not time for refills.   Confirmed delivery date of 02/06/2022, advised patient that pharmacy will contact them the morning of delivery.  Cycle dispensing form sent to [facility] for review.   [place] Pharmacist Assistant [phone]

## 2022-02-04 ENCOUNTER — Other Ambulatory Visit: Payer: Self-pay | Admitting: Family Medicine

## 2022-02-04 DIAGNOSIS — G8929 Other chronic pain: Secondary | ICD-10-CM

## 2022-02-05 ENCOUNTER — Other Ambulatory Visit: Payer: Self-pay | Admitting: Family Medicine

## 2022-02-05 DIAGNOSIS — G8929 Other chronic pain: Secondary | ICD-10-CM

## 2022-02-21 ENCOUNTER — Telehealth: Payer: Self-pay

## 2022-02-21 NOTE — Telephone Encounter (Signed)
Prior authorization approved from [facility] for Tramadol HCL 50 mg.   Approved until 01/27/23.

## 2022-02-24 NOTE — Telephone Encounter (Signed)
Noted  

## 2022-03-26 ENCOUNTER — Other Ambulatory Visit: Payer: Self-pay | Admitting: Family Medicine

## 2022-03-26 DIAGNOSIS — G8929 Other chronic pain: Secondary | ICD-10-CM

## 2022-03-26 DIAGNOSIS — E1169 Type 2 diabetes mellitus with other specified complication: Secondary | ICD-10-CM

## 2022-03-31 ENCOUNTER — Other Ambulatory Visit (HOSPITAL_COMMUNITY): Payer: Self-pay

## 2022-03-31 ENCOUNTER — Telehealth: Payer: Self-pay

## 2022-03-31 NOTE — Telephone Encounter (Signed)
Patient Advocate Encounter  Prior Authorization for [employer] Sensor has been approved.    PA# [id number] Effective dates: 01/27/22 through 01/27/23

## 2022-04-01 NOTE — Telephone Encounter (Signed)
Send pt a message via MyChart

## 2022-04-23 ENCOUNTER — Other Ambulatory Visit: Payer: Self-pay | Admitting: Family Medicine

## 2022-04-23 ENCOUNTER — Other Ambulatory Visit: Payer: Self-pay

## 2022-04-23 MED ORDER — POTASSIUM CHLORIDE CRYS ER 10 MEQ PO TBCR: 10.0000 meq | EXTENDED_RELEASE_TABLET | Freq: Every day | ORAL | 0 refills | Status: DC

## 2022-04-23 NOTE — Telephone Encounter (Signed)
Last refill sent by historical provider 

## 2022-04-24 ENCOUNTER — Other Ambulatory Visit: Payer: Self-pay | Admitting: Family Medicine

## 2022-04-24 MED ORDER — METFORMIN HCL ER 500 MG PO TB24: 500.0000 mg | ORAL_TABLET | Freq: Every day | ORAL | 3 refills | Status: DC

## 2022-04-24 NOTE — Telephone Encounter (Signed)
Last OV-01/17/22 No future OV scheduled.

## 2022-04-25 ENCOUNTER — Other Ambulatory Visit: Payer: Self-pay | Admitting: Family Medicine

## 2022-06-20 ENCOUNTER — Other Ambulatory Visit: Payer: Self-pay | Admitting: Family Medicine

## 2022-06-23 ENCOUNTER — Other Ambulatory Visit: Payer: Self-pay | Admitting: Family Medicine

## 2022-06-30 ENCOUNTER — Other Ambulatory Visit: Payer: Self-pay | Admitting: Family Medicine

## 2022-07-04 ENCOUNTER — Other Ambulatory Visit: Payer: Self-pay

## 2022-07-04 ENCOUNTER — Telehealth: Payer: Self-pay | Admitting: Family Medicine

## 2022-07-04 DIAGNOSIS — G8929 Other chronic pain: Secondary | ICD-10-CM

## 2022-07-04 MED ORDER — CYCLOBENZAPRINE HCL 10 MG PO TABS: ORAL_TABLET | ORAL | 3 refills | Status: DC

## 2022-07-04 MED ORDER — OMEPRAZOLE 20 MG PO CPDR: 20.0000 mg | DELAYED_RELEASE_CAPSULE | Freq: Two times a day (BID) | ORAL | 0 refills | Status: DC

## 2022-07-04 NOTE — Telephone Encounter (Signed)
Pt Rx sent to the pharmacy 

## 2022-07-04 NOTE — Telephone Encounter (Signed)
Prescription Request  07/04/2022  LOV: 01/17/2022  What is the name of the medication or equipment?  omeprazole (PRILOSEC) 20 MG capsule  cyclobenzaprine (FLEXERIL) 10 MG tablet   [facility] called to F/U on these refill requests.  Have you contacted your pharmacy to request a refill? Yes   Which pharmacy would you like this sent to?  [facility] - [place], [place] - [address] [place] [address] Phone: [phone] Fax: [phone]    Patient notified that their request is being sent to the clinical staff for review and that they should receive a response within 2 business days.   Please advise at Mobile [phone] (mobile) OMEP CYCLO

## 2022-07-14 ENCOUNTER — Ambulatory Visit (INDEPENDENT_AMBULATORY_CARE_PROVIDER_SITE_OTHER): Payer: Medicare HMO

## 2022-07-14 ENCOUNTER — Telehealth: Payer: Self-pay | Admitting: Family Medicine

## 2022-07-14 VITALS — Ht 67.0 in | Wt 214.0 lb

## 2022-07-14 DIAGNOSIS — Z0000 Encounter for general adult medical examination without abnormal findings: Secondary | ICD-10-CM | POA: Diagnosis not present

## 2022-07-14 DIAGNOSIS — Z1231 Encounter for screening mammogram for malignant neoplasm of breast: Secondary | ICD-10-CM | POA: Diagnosis not present

## 2022-07-14 NOTE — Telephone Encounter (Signed)
Pt will be here for an OV on 07/24/22.  Pt states someone scheduled her for an eye exam at this location, without confirming with her first.   Pt would like more information regarding this eye exam.

## 2022-07-14 NOTE — Patient Instructions (Addendum)
Christina [name] , Thank you for taking time to come for your Medicare Wellness Visit. I appreciate your ongoing commitment to your health goals. Please review the following plan we discussed and let me know if I can assist you in the future.   These are the goals we discussed:  Goals       Patient stated (pt-stated)      I want to feel and do better with health.      Weight Loss Achieved      Evidence-based guidance:  Review medication that may contribute to weight gain, such as corticosteroid, beta-blocker, tricyclic antidepressant, oral antihyperglycemic; advocate for changes when appropriate.  Perform or refer to registered dietitian to perform comprehensive nutrition assessment that includes disordered-eating behaviors, such as binge-eating, emotional or compulsive eating, grazing.  Counsel patient regarding health risks of obesity and that weight loss goal of 5 to 10 percent of initial weight will improve risk.  Recommend initial weight loss goal of 3 to 5 percent of bodyweight; increase weight-loss goals based on patient success as achieving greater weight loss continues to reduce risk.  Propose a calorie-reduced diet based on the patient's preferences and health status.  Provide ongoing emotional support or cognitive behavioral therapy and dietitian services (individual, group, virtual) over at least 6 months with a minimum of 14 encounters to best facilitate weight loss.  Provide monthly follow-up for 12 months when weight loss goal is met to assist with maintenance of weight loss.  Encourage increased physical activity or exercise based on individual age, risk, and ability up to 200 to 300 minutes per week that includes aerobic and resistance training.  Encourage reduction in sedentary behaviors by replacing them with nonexercise yet active leisure pursuits.  Identify physical barriers, such as change in posture, balance, gait patterns, joint pain, and environmental barriers to activity.   Consider referral to rehabilitation therapy, especially when mobility or function is impaired due to osteoarthritis and obesity.  Consider referral to weight-loss program that has published evidence of safety and efficacy if on-site intensive intervention is unavailable or patient preference.  Prepare patient for use of pharmacologic therapy as an adjunct to lifestyle changes based on body mass index, patient agreement and presence of risk factors or comorbidities.  Evaluate efficacy of pharmacologic therapy (weight loss) and tolerance to medication periodically.  Engage in shared decision-making regarding referral to bariatric surgeon for consultation and evaluation when weight-loss goal has not been accomplished by behavioral therapy with or without pharmacologic therapy.   Notes:         This is a list of the screening recommended for you and due dates:  Health Maintenance  Topic Date Due   Complete foot exam   Never done   Eye exam for diabetics  Never done   Hepatitis C Screening  Never done   Pap Smear  Never done   Yearly kidney health urinalysis for diabetes  02/22/2015   Mammogram  07/05/2021   Yearly kidney function blood test for diabetes  07/13/2022   COVID-19 Vaccine (3 - 2023-24 season) 07/30/2022*   Zoster (Shingles) Vaccine (1 of 2) 10/14/2022*   Hemoglobin A1C  07/19/2022   Flu Shot  08/28/2022   Medicare Annual Wellness Visit  07/14/2023   Colon Cancer Screening  05/01/2025   DTaP/Tdap/Td vaccine (2 - Td or Tdap) 04/02/2027   HIV Screening  Completed   HPV Vaccine  Aged Out  *Topic was postponed. The date shown is not the original due date.  Opioid Pain Medicine Management Opioids are powerful medicines that are used to treat moderate to severe pain. When used for short periods of time, they can help you to: Sleep better. Do better in physical or occupational therapy. Feel better in the first few days after an injury. Recover from surgery. Opioids should be  taken with the supervision of a trained health care provider. They should be taken for the shortest period of time possible. This is because opioids can be addictive, and the longer you take opioids, the greater your risk of addiction. This addiction can also be called opioid use disorder. What are the risks? Using opioid pain medicines for longer than 3 days increases your risk of side effects. Side effects include: Constipation. Nausea and vomiting. Breathing difficulties (respiratory depression). Drowsiness. Confusion. Opioid use disorder. Itching. Taking opioid pain medicine for a long period of time can affect your ability to do daily tasks. It also puts you at risk for: Motor vehicle crashes. Depression. Suicide. Heart attack. Overdose, which can be life-threatening. What is a pain treatment plan? A pain treatment plan is an agreement between you and your health care provider. Pain is unique to each person, and treatments vary depending on your condition. To manage your pain, you and your health care provider need to work together. To help you do this: Discuss the goals of your treatment, including how much pain you might expect to have and how you will manage the pain. Review the risks and benefits of taking opioid medicines. Remember that a good treatment plan uses more than one approach and minimizes the chance of side effects. Be honest about the amount of medicines you take and about any drug or alcohol use. Get pain medicine prescriptions from only one health care provider. Pain can be managed with many types of alternative treatments. Ask your health care provider to refer you to one or more specialists who can help you manage pain through: Physical or occupational therapy. Counseling (cognitive behavioral therapy). Good nutrition. Biofeedback. Massage. Meditation. Non-opioid medicine. Following a gentle exercise program. How to use opioid pain medicine Taking  medicine Take your pain medicine exactly as told by your health care provider. Take it only when you need it. If your pain gets less severe, you may take less than your prescribed dose if your health care provider approves. If you are not having pain, do nottake pain medicine unless your health care provider tells you to take it. If your pain is severe, do nottry to treat it yourself by taking more pills than instructed on your prescription. Contact your health care provider for help. Write down the times when you take your pain medicine. It is easy to become confused while on pain medicine. Writing the time can help you avoid overdose. Take other over-the-counter or prescription medicines only as told by your health care provider. Keeping yourself and others safe  While you are taking opioid pain medicine: Do not drive, use machinery, or power tools. Do not sign legal documents. Do not drink alcohol. Do not take sleeping pills. Do not supervise children by yourself. Do not do activities that require climbing or being in high places. Do not go to a lake, river, ocean, spa, or swimming pool. Do not share your pain medicine with anyone. Keep pain medicine in a locked cabinet or in a secure area where pets and children cannot reach it. Stopping your use of opioids If you have been taking opioid medicine for more than a few  weeks, you may need to slowly decrease (taper) how much you take until you stop completely. Tapering your use of opioids can decrease your risk of symptoms of withdrawal, such as: Pain and cramping in the abdomen. Nausea. Sweating. Sleepiness. Restlessness. Uncontrollable shaking (tremors). Cravings for the medicine. Do not attempt to taper your use of opioids on your own. Talk with your health care provider about how to do this. Your health care provider may prescribe a step-down schedule based on how much medicine you are taking and how long you have been taking  it. Getting rid of leftover pills Do not save any leftover pills. Get rid of leftover pills safely by: Taking the medicine to a prescription take-back program. This is usually offered by the county or law enforcement. Bringing them to a pharmacy that has a drug disposal container. Flushing them down the toilet. Check the label or package insert of your medicine to see whether this is safe to do. Throwing them out in the trash. Check the label or package insert of your medicine to see whether this is safe to do. If it is safe to throw it out, remove the medicine from the original container, put it into a sealable bag or container, and mix it with used coffee grounds, food scraps, dirt, or cat litter before putting it in the trash. Follow these instructions at home: Activity Do exercises as told by your health care provider. Avoid activities that make your pain worse. Return to your normal activities as told by your health care provider. Ask your health care provider what activities are safe for you. General instructions You may need to take these actions to prevent or treat constipation: Drink enough fluid to keep your urine pale yellow. Take over-the-counter or prescription medicines. Eat foods that are high in fiber, such as beans, whole grains, and fresh fruits and vegetables. Limit foods that are high in fat and processed sugars, such as fried or sweet foods. Keep all follow-up visits. This is important. Where to find support If you have been taking opioids for a long time, you may benefit from receiving support for quitting from a local support group or counselor. Ask your health care provider for a referral to these resources in your area. Where to find more information Centers for Disease Control and Prevention (CDC): FootballExhibition.com.br [place] Food and Drug Administration (FDA): PumpkinSearch.com.ee Get help right away if: You may have taken too much of an opioid (overdosed). Common symptoms of an  overdose: Your breathing is slower or more shallow than normal. You have a very slow heartbeat (pulse). You have slurred speech. You have nausea and vomiting. Your pupils become very small. You have other potential symptoms: You are very confused. You faint or feel like you will faint. You have cold, clammy skin. You have blue lips or fingernails. You have thoughts of harming yourself or harming others. These symptoms may represent a serious problem that is an emergency. Do not wait to see if the symptoms will go away. Get medical help right away. Call your local emergency services (911 in the [place] Do not drive yourself to the hospital.  If you ever feel like you may hurt yourself or others, or have thoughts about taking your own life, get help right away. Go to your nearest emergency department or: Call your local emergency services (911 in the [place] Call the [facility] (514-376-1452 in the [place] Call a suicide crisis helpline, such as the National Suicide Prevention Lifeline  at 425 853 0885 or 988 in the [place] This is open 24 hours a day in the [place] Text the Crisis Text Line at 228 710 5714 (in the [place] Summary Opioid medicines can help you manage moderate to severe pain for a short period of time. A pain treatment plan is an agreement between you and your health care provider. Discuss the goals of your treatment, including how much pain you might expect to have and how you will manage the pain. If you think that you or someone else may have taken too much of an opioid, get medical help right away. This information is not intended to replace advice given to you by your health care provider. Make sure you discuss any questions you have with your health care provider. Document Revised: 08/08/2020 Document Reviewed: 04/25/2020 Elsevier Patient Education  2024 [employer]  Advanced directives: Advance directive discussed with you today. Even though you declined this  today, please call our office should you change your mind, and we can give you the proper paperwork for you to fill out.   Conditions/risks identified: None  Next appointment: Follow up in one year for your annual wellness visit.   Preventive Care 40-64 Years, Female Preventive care refers to lifestyle choices and visits with your health care provider that can promote health and wellness. What does preventive care include? A yearly physical exam. This is also called an annual well check. Dental exams once or twice a year. Routine eye exams. Ask your health care provider how often you should have your eyes checked. Personal lifestyle choices, including: Daily care of your teeth and gums. Regular physical activity. Eating a healthy diet. Avoiding tobacco and drug use. Limiting alcohol use. Practicing safe sex. Taking low-dose aspirin daily starting at age 36. Taking vitamin and mineral supplements as recommended by your health care provider. What happens during an annual well check? The services and screenings done by your health care provider during your annual well check will depend on your age, overall health, lifestyle risk factors, and family history of disease. Counseling  Your health care provider may ask you questions about your: Alcohol use. Tobacco use. Drug use. Emotional well-being. Home and relationship well-being. Sexual activity. Eating habits. Work and work Astronomer. Method of birth control. Menstrual cycle. Pregnancy history. Screening  You may have the following tests or measurements: Height, weight, and BMI. Blood pressure. Lipid and cholesterol levels. These may be checked every 5 years, or more frequently if you are over 23 years old. Skin check. Lung cancer screening. You may have this screening every year starting at age 25 if you have a 30-pack-year history of smoking and currently smoke or have quit within the past 15 years. Fecal occult blood test  (FOBT) of the stool. You may have this test every year starting at age 60. Flexible sigmoidoscopy or colonoscopy. You may have a sigmoidoscopy every 5 years or a colonoscopy every 10 years starting at age 20. Hepatitis C blood test. Hepatitis B blood test. Sexually transmitted disease (STD) testing. Diabetes screening. This is done by checking your blood sugar (glucose) after you have not eaten for a while (fasting). You may have this done every 1-3 years. Mammogram. This may be done every 1-2 years. Talk to your health care provider about when you should start having regular mammograms. This may depend on whether you have a family history of breast cancer. BRCA-related cancer screening. This may be done if you have a family history of breast, ovarian, tubal, or peritoneal  cancers. Pelvic exam and Pap test. This may be done every 3 years starting at age 70. Starting at age 82, this may be done every 5 years if you have a Pap test in combination with an HPV test. Bone density scan. This is done to screen for osteoporosis. You may have this scan if you are at high risk for osteoporosis. Discuss your test results, treatment options, and if necessary, the need for more tests with your health care provider. Vaccines  Your health care provider may recommend certain vaccines, such as: Influenza vaccine. This is recommended every year. Tetanus, diphtheria, and acellular pertussis (Tdap, Td) vaccine. You may need a Td booster every 10 years. Zoster vaccine. You may need this after age 10. Pneumococcal 13-valent conjugate (PCV13) vaccine. You may need this if you have certain conditions and were not previously vaccinated. Pneumococcal polysaccharide (PPSV23) vaccine. You may need one or two doses if you smoke cigarettes or if you have certain conditions. Talk to your health care provider about which screenings and vaccines you need and how often you need them. This information is not intended to replace  advice given to you by your health care provider. Make sure you discuss any questions you have with your health care provider. Document Released: 02/09/2015 Document Revised: 10/03/2015 Document Reviewed: 11/14/2014 Elsevier Interactive Patient Education  2017 [employer].    Fall Prevention in the Home Falls can cause injuries. They can happen to people of all ages. There are many things you can do to make your home safe and to help prevent falls. What can I do on the outside of my home? Regularly fix the edges of walkways and driveways and fix any cracks. Remove anything that might make you trip as you walk through a door, such as a raised step or threshold. Trim any bushes or trees on the path to your home. Use bright outdoor lighting. Clear any walking paths of anything that might make someone trip, such as rocks or tools. Regularly check to see if handrails are loose or broken. Make sure that both sides of any steps have handrails. Any raised decks and porches should have guardrails on the edges. Have any leaves, snow, or ice cleared regularly. Use sand or salt on walking paths during winter. Clean up any spills in your garage right away. This includes oil or grease spills. What can I do in the bathroom? Use night lights. Install grab bars by the toilet and in the tub and shower. Do not use towel bars as grab bars. Use non-skid mats or decals in the tub or shower. If you need to sit down in the shower, use a plastic, non-slip stool. Keep the floor dry. Clean up any water that spills on the floor as soon as it happens. Remove soap buildup in the tub or shower regularly. Attach bath mats securely with double-sided non-slip rug tape. Do not have throw rugs and other things on the floor that can make you trip. What can I do in the bedroom? Use night lights. Make sure that you have a light by your bed that is easy to reach. Do not use any sheets or blankets that are too big for your  bed. They should not hang down onto the floor. Have a firm chair that has side arms. You can use this for support while you get dressed. Do not have throw rugs and other things on the floor that can make you trip. What can I do in the  kitchen? Clean up any spills right away. Avoid walking on wet floors. Keep items that you use a lot in easy-to-reach places. If you need to reach something above you, use a strong step stool that has a grab bar. Keep electrical cords out of the way. Do not use floor polish or wax that makes floors slippery. If you must use wax, use non-skid floor wax. Do not have throw rugs and other things on the floor that can make you trip. What can I do with my stairs? Do not leave any items on the stairs. Make sure that there are handrails on both sides of the stairs and use them. Fix handrails that are broken or loose. Make sure that handrails are as long as the stairways. Check any carpeting to make sure that it is firmly attached to the stairs. Fix any carpet that is loose or worn. Avoid having throw rugs at the top or bottom of the stairs. If you do have throw rugs, attach them to the floor with carpet tape. Make sure that you have a light switch at the top of the stairs and the bottom of the stairs. If you do not have them, ask someone to add them for you. What else can I do to help prevent falls? Wear shoes that: Do not have high heels. Have rubber bottoms. Are comfortable and fit you well. Are closed at the toe. Do not wear sandals. If you use a stepladder: Make sure that it is fully opened. Do not climb a closed stepladder. Make sure that both sides of the stepladder are locked into place. Ask someone to hold it for you, if possible. Clearly mark and make sure that you can see: Any grab bars or handrails. First and last steps. Where the edge of each step is. Use tools that help you move around (mobility aids) if they are needed. These  include: Canes. Walkers. Scooters. Crutches. Turn on the lights when you go into a dark area. Replace any light bulbs as soon as they burn out. Set up your furniture so you have a clear path. Avoid moving your furniture around. If any of your floors are uneven, fix them. If there are any pets around you, be aware of where they are. Review your medicines with your doctor. Some medicines can make you feel dizzy. This can increase your chance of falling. Ask your doctor what other things that you can do to help prevent falls. This information is not intended to replace advice given to you by your health care provider. Make sure you discuss any questions you have with your health care provider. Document Released: 11/09/2008 Document Revised: 06/21/2015 Document Reviewed: 02/17/2014 Elsevier Interactive Patient Education  2017 [employer].

## 2022-07-14 NOTE — Progress Notes (Signed)
Subjective:   Christina [name] is a 57 y.o. female who presents for Medicare Annual (Subsequent) preventive examination.  Review of Systems    Virtual Visit via Telephone Note  I connected with  Christina [name] on 07/14/22 at  8:15 AM EDT by telephone and verified that I am speaking with the correct person using two identifiers.  Location: Patient: Home Provider: Office Persons participating in the virtual visit: patient/Nurse Health Advisor   I discussed the limitations, risks, security and privacy concerns of performing an evaluation and management service by telephone and the availability of in person appointments. The patient expressed understanding and agreed to proceed.  Interactive audio and video telecommunications were attempted between this nurse and patient, however failed, due to patient having technical difficulties OR patient did not have access to video capability.  We continued and completed visit with audio only.  Some vital signs may be absent or patient reported.   Tillie Rung, LPN  Cardiac Risk Factors include: advanced age (>1men, >97 women);diabetes mellitus;hypertension     Objective:    Today's Vitals   07/14/22 0820  Weight: 214 lb (97.1 kg)  Height: 5\' 7"  (1.702 m)   Body mass index is 33.52 kg/m.     07/14/2022    8:41 AM 07/09/2021    9:18 AM 06/14/2020    1:35 PM 05/02/2015    1:16 PM 05/25/2014    1:12 PM  Advanced Directives  Does Patient Have a Medical Advance Directive? No Yes Yes No Yes  Type of Science writer of [place];Living will    Does patient want to make changes to medical advance directive?  No - Patient declined No - Patient declined    Copy of Healthcare Power of Attorney in Chart?  No - copy requested No - copy requested    Would patient like information on creating a medical advance directive? No - Patient declined No - Patient declined       Current Medications  (verified) Outpatient Encounter Medications as of 07/14/2022  Medication Sig   aspirin 81 MG EC tablet Take 81 mg by mouth daily.   atorvastatin (LIPITOR) 10 MG tablet TAKE ONE TABLET BY MOUTH ONCE DAILY   cholecalciferol (VITAMIN D) 1000 UNITS tablet Take 1,000 Units by mouth daily.   Cinnamon 500 MG TABS Take 2 tablets by mouth daily.   clobetasol (TEMOVATE) 0.05 % external solution Apply 1 application topically 2 (two) times daily.   Continuous Blood Gluc Receiver (FREESTYLE LIBRE 14 DAY READER) DEVI 1 application by Does not apply route every 14 (fourteen) days.   Continuous Blood Gluc Sensor (FREESTYLE LIBRE 2 SENSOR) MISC Apply new sensor every 14 days to monitor blood glucose. Remove old sensor before applying new one.   cyclobenzaprine (FLEXERIL) 10 MG tablet Take 1 tablet by mouth three times daily as needed for muscle spasms   desonide (DESOWEN) 0.05 % cream Apply topically 2 (two) times daily.   diclofenac Sodium (VOLTAREN) 1 % GEL Apply 4 g topically 4 (four) times daily.   diltiazem (CARDIZEM CD) 360 MG 24 hr capsule TAKE ONE CAPSULE BY MOUTH ONCE DAILY   diphenhydrAMINE (BENADRYL) 25 mg capsule Take 25 mg by mouth as needed.   EPINEPHrine 0.3 mg/0.3 mL IJ SOAJ injection Inject 0.3 mg into the muscle as needed for anaphylaxis.   fenofibrate 160 MG tablet TAKE ONE TABLET BY MOUTH ONCE DAILY   gabapentin (NEURONTIN) 300 MG capsule TAKE TWO  CAPSULES BY MOUTH THREE TIMES DAILY   HYDROmorphone (DILAUDID) 4 MG tablet Take 1 tablet (4 mg total) by mouth every 6 (six) hours as needed for severe pain.   levothyroxine (SYNTHROID) 50 MCG tablet TAKE ONE TABLET BY MOUTH EVERY MORNING   metFORMIN (GLUCOPHAGE-XR) 500 MG 24 hr tablet Take 1 tablet (500 mg total) by mouth daily with breakfast.   metoprolol succinate (TOPROL-XL) 100 MG 24 hr tablet TAKE ONE TABLET BY MOUTH ONCE DAILY WITH A MEAL   mupirocin ointment (BACTROBAN) 2 % Apply topically 3 (three) times daily.   omeprazole (PRILOSEC) 20  MG capsule Take 1 capsule (20 mg total) by mouth 2 (two) times daily.   OZEMPIC, 2 MG/DOSE, 8 MG/3ML SOPN inject 2mg  ONCE A WEEK   potassium chloride (KLOR-CON M) 10 MEQ tablet Take 1 tablet (10 mEq total) by mouth daily.   promethazine (PHENERGAN) 25 MG tablet Take 1 tablet (25 mg total) by mouth every 6 (six) hours as needed for nausea.   temazepam (RESTORIL) 30 MG capsule TAKE ONE CAPSULE BY MOUTH EVERYDAY AT BEDTIME AS NEEDED FOR SLEEP   traMADol (ULTRAM) 50 MG tablet TAKE TWO TABLETS BY MOUTH THREE TIMES DAILY AS NEEDED   triamcinolone cream (KENALOG) 0.1 % Apply 1 application topically 2 (two) times daily.   venlafaxine XR (EFFEXOR-XR) 150 MG 24 hr capsule TAKE ONE CAPSULE BY MOUTH EVERY MORNING   vitamin B-12 (CYANOCOBALAMIN) 1000 MCG tablet Take 1,000 mcg by mouth daily. 2500 mcg once daily   vitamin E 180 MG (400 UNITS) capsule Take 400 Units by mouth daily.   [DISCONTINUED] potassium chloride (KLOR-CON 10) 10 MEQ CR tablet Take 1 tablet (10 mEq total) by mouth daily.   No facility-administered encounter medications on file as of 07/14/2022.    Allergies (verified) Antihistamines, loratadine-type; Bee venom; Clindamycin/lincomycin; Codeine; Doxycycline; Lisinopril; Metronidazole; Ondansetron hcl; Tylox [oxycodone-acetaminophen]; Vancomycin; Albuterol; Benadryl [diphenhydramine]; Lidocaine; and Oxycodone-acetaminophen   History: Past Medical History:  Diagnosis Date   Allergy    Angioedema    Asthma    Blood transfusion without reported diagnosis    with both vaginal deliveries bleed out and had blood   CKD (chronic kidney disease) stage 3, GFR 30-59 ml/min (HCC)    sees Dr. Jaye Beagle at [facility]   DDD (degenerative disc disease), lumbar    Diabetes mellitus    Eczema    GERD (gastroesophageal reflux disease)    Hx of colonic polyps    Hyperlipidemia    Hypertension    Interstitial cystitis    per Dr. Isabel Caprice   Low back pain    Meningitis    Seizures (HCC)     seizures with brain tumor-no sz since 1984   Thyroid disease    Past Surgical History:  Procedure Laterality Date   ABDOMINAL HYSTERECTOMY     BRAIN TUMOR EXCISION     benign 1984   CHOLECYSTECTOMY     COLONOSCOPY  05/02/2015   per Dr. Lavon Paganini, internal hemorrhoids but no polyps, repeat in 10 yrs    cystic mass  4.4.11   remval from abdomen per Dr. Hardin Negus at [facility]   POLYPECTOMY     SVD X 2     TONSILLECTOMY     TUBAL LIGATION     bilateral   Family History  Problem Relation Age of Onset   Hypertension Other        family hx   Stroke Mother    Rheum arthritis Mother  COPD Mother    Stroke Father    Colon cancer Father    Colon polyps Neg Hx    Social History   Socioeconomic History   Marital status: Married    Spouse name: Not on file   Number of children: Not on file   Years of education: Not on file   Highest education level: Not on file  Occupational History   Not on file  Tobacco Use   Smoking status: Former   Smokeless tobacco: Never  Substance and Sexual Activity   Alcohol use: No    Alcohol/week: 0.0 standard drinks of alcohol   Drug use: No   Sexual activity: Not on file  Other Topics Concern   Not on file  Social History Narrative   Not on file   Social Determinants of Health   Financial Resource Strain: Low Risk  (07/14/2022)   Overall Financial Resource Strain (CARDIA)    Difficulty of Paying Living Expenses: Not hard at all  Food Insecurity: No Food Insecurity (07/14/2022)   Hunger Vital Sign    Worried About Running Out of Food in the Last Year: Never true    Ran Out of Food in the Last Year: Never true  Transportation Needs: No Transportation Needs (07/14/2022)   PRAPARE - Administrator, Civil Service (Medical): No    Lack of Transportation (Non-Medical): No  Physical Activity: Inactive (07/14/2022)   Exercise Vital Sign    Days of Exercise per Week: 0 days    Minutes of Exercise per Session: 0 min   Stress: No Stress Concern Present (07/14/2022)   [employer] of Occupational Health - Occupational Stress Questionnaire    Feeling of Stress : Not at all  Social Connections: Moderately Integrated (07/14/2022)   Social Connection and Isolation Panel [NHANES]    Frequency of Communication with Friends and Family: More than three times a week    Frequency of Social Gatherings with Friends and Family: More than three times a week    Attends Religious Services: Never    Database administrator or Organizations: No    Attends Engineer, structural: More than 4 times per year    Marital Status: Married    Tobacco Counseling Counseling given: Not Answered   Clinical Intake:  Pre-visit preparation completed: Yes  Pain : No/denies painNutrition Risk Assessment:  Has the patient had any N/V/D within the last 2 months?  No  Does the patient have any non-healing wounds?  No  Has the patient had any unintentional weight loss or weight gain?  No   Diabetes:  Is the patient diabetic?  Yes  If diabetic, was a CBG obtained today?  CBG 157 Taken by patient Did the patient bring in their glucometer from home?  No  How often do you monitor your CBG's? Daily/Monitor.   Financial Strains and Diabetes Management:  Are you having any financial strains with the device, your supplies or your medication? No .  Does the patient want to be seen by Chronic Care Management for management of their diabetes?  No  Would the patient like to be referred to a Nutritionist or for Diabetic Management?  No   Diabetic Exams:  Diabetic Eye Exam: Completed . Overdue for diabetic eye exam. Pt has been advised about the importance in completing this exam. A referral has been placed today. Message sent to referral coordinator for scheduling purposes. Advised pt to expect a call from office referred to regarding appt.  Diabetic Foot Exam: Completed . Pt has been advised about the importance in completing  this exam. Pt is scheduled for diabetic foot exam on Followed By PCP.       BMI - recorded: 33.52 Nutritional Status: BMI > 30  Obese Nutritional Risks: None Diabetes: No  How often do you need to have someone help you when you read instructions, pamphlets, or other written materials from your doctor or pharmacy?: 3 - Sometimes (Family Assist)  Diabetic?  Yes  Interpreter Needed?: No  Information entered by :: Theresa Mulligan LPN   Activities of Daily Living    07/14/2022    8:34 AM 07/13/2022   11:39 PM  In your present state of health, do you have any difficulty performing the following activities:  Hearing? 1 1  Comment Pending audiology appt   Vision? 1 1  Comment Pending appt   Difficulty concentrating or making decisions? 1 1  Comment Followed by PCP   Walking or climbing stairs? 1 1  Comment Uses a wheelchair, cane, walker   Dressing or bathing? 1 1  Comment Husband assist   Doing errands, shopping? 1 1  Comment Husband assist   Preparing Food and eating ? Y Y  Comment Husband assist   Using the Toilet? N Y  In the past six months, have you accidently leaked urine? Malvin Johns  Comment Wears pads. Followed by PCP   Do you have problems with loss of bowel control? N N  Managing your Medications? Malvin Johns  Comment Husband assist   Managing your Finances? Malvin Johns  Comment Husband assist   Housekeeping or managing your Housekeeping? Malvin Johns  Comment Husband assist     Patient Care Team: Nelwyn Salisbury, MD as PCP - General Reinaldo Raddle, Encarnacion Slates, [facility] (Inactive) as Pharmacist (Pharmacist)  Indicate any recent Medical Services you may have received from other than [facility] providers in the past year (date may be approximate).     Assessment:   This is a routine wellness examination for Christina [name].  Hearing/Vision screen Hearing Screening - Comments:: Denies hearing difficulties   Vision Screening - Comments:: Wears rx glasses -   Not up to date with routine eye exams with  [facility]  Dietary issues and exercise activities discussed:     Goals Addressed               This Visit's Progress     Patient stated (pt-stated)        I want to feel and do better with health.       Depression Screen    07/14/2022    8:33 AM 07/12/2021   11:20 AM 07/09/2021    9:00 AM 11/16/2020    3:29 PM 06/14/2020    1:37 PM 06/14/2020    1:35 PM 07/06/2019    3:07 PM  PHQ 2/9 Scores  PHQ - 2 Score 0 2 0 5 0 0 4  PHQ- 9 Score  15  15   14     Fall Risk    07/13/2022   11:39 PM 07/12/2021   11:20 AM 07/09/2021    9:10 AM 11/16/2020    1:12 PM 06/14/2020    1:36 PM  Fall Risk   Falls in the past year? 0 0 1 1 0  Number falls in past yr: 0 0 0 0 0  Injury with Fall? 0 0 1 1 0  Comment   Bruised left  knee and Toe. Followed by  Medicial attention Knee pain   Risk for fall due to : No Fall Risks No Fall Risks Other (Comment) History of fall(s) Impaired balance/gait;Impaired mobility  Risk for fall due to: Comment   Dx Spinal Stenosis. Followed by PCP    Follow up Falls prevention discussed Falls evaluation completed   Falls evaluation completed    FALL RISK PREVENTION PERTAINING TO THE HOME:  Any stairs in or around the home? Yes  If so, are there any without handrails? No  Home free of loose throw rugs in walkways, pet beds, electrical cords, etc? Yes  Adequate lighting in your home to reduce risk of falls? Yes   ASSISTIVE DEVICES UTILIZED TO PREVENT FALLS:  Life alert? No  Use of a cane, walker or w/c? Yes  Grab bars in the bathroom? No  Shower chair or bench in shower? Yes  Elevated toilet seat or a handicapped toilet? No   TIMED UP AND GO:  Was the test performed? No . Audio Visit   Cognitive Function:        07/14/2022    8:41 AM 07/09/2021    9:19 AM  6CIT Screen  What Year? 0 points 0 points  What month? 0 points 0 points  What time? 0 points 0 points  Count back from 20 0 points 0 points  Months in reverse 0 points 0 points  Repeat phrase 0  points 0 points  Total Score 0 points 0 points    Immunizations Immunization History  Administered Date(s) Administered   Influenza Whole 12/27/2004, 11/27/2008   Influenza,inj,Quad PF,6+ Mos 10/08/2012, 10/05/2013, 10/06/2014, 01/04/2016, 10/29/2016, 12/23/2017, 11/30/2018, 11/07/2019, 11/16/2020, 01/17/2022   PFIZER(Purple Top)SARS-COV-2 Vaccination 05/20/2019, 06/15/2019   Pneumococcal Polysaccharide-23 01/16/2020   Tdap 04/01/2017    TDAP status: Up to date  Flu Vaccine status: Up to date    Covid-19 vaccine status: Completed vaccines  Qualifies for Shingles Vaccine? Yes   Zostavax completed No   Shingrix Completed?: No.    Education has been provided regarding the importance of this vaccine. Patient has been advised to call insurance company to determine out of pocket expense if they have not yet received this vaccine. Advised may also receive vaccine at local pharmacy or Health Dept. Verbalized acceptance and understanding.  Screening Tests Health Maintenance  Topic Date Due   FOOT EXAM  Never done   OPHTHALMOLOGY EXAM  Never done   Hepatitis C Screening  Never done   PAP SMEAR-Modifier  Never done   Diabetic kidney evaluation - Urine ACR  02/22/2015   MAMMOGRAM  07/05/2021   Diabetic kidney evaluation - eGFR measurement  07/13/2022   COVID-19 Vaccine (3 - 2023-24 season) 07/30/2022 (Originally 09/27/2021)   Zoster Vaccines- Shingrix (1 of 2) 10/14/2022 (Originally 08/28/2014)   HEMOGLOBIN A1C  07/19/2022   INFLUENZA VACCINE  08/28/2022   Medicare Annual Wellness (AWV)  07/14/2023   Colonoscopy  05/01/2025   DTaP/Tdap/Td (2 - Td or Tdap) 04/02/2027   HIV Screening  Completed   HPV VACCINES  Aged Out    Health Maintenance  Health Maintenance Due  Topic Date Due   FOOT EXAM  Never done   OPHTHALMOLOGY EXAM  Never done   Hepatitis C Screening  Never done   PAP SMEAR-Modifier  Never done   Diabetic kidney evaluation - Urine ACR  02/22/2015   MAMMOGRAM  07/05/2021    Diabetic kidney evaluation - eGFR measurement  07/13/2022    Colorectal cancer screening: Type of screening: Colonoscopy. Completed 05/02/15. Repeat  every 10 years  Mammogram status: Ordered 07/14/22. Pt provided with contact info and advised to call to schedule appt.     Lung Cancer Screening: (Low Dose CT Chest recommended if Age 11-80 years, 30 pack-year currently smoking OR have quit w/in 15years.) does not qualify.     Additional Screening:  Hepatitis C Screening: does qualify; Deferred  Vision Screening: Recommended annual ophthalmology exams for early detection of glaucoma and other disorders of the eye. Is the patient up to date with their annual eye exam?  No  Who is the provider or what is the name of the office in which the patient attends annual eye exams? [facility] If pt is not established with a provider, would they like to be referred to a provider to establish care? No .   Dental Screening: Recommended annual dental exams for proper oral hygiene  Community Resource Referral / Chronic Care Management:  CRR required this visit?  No   CCM required this visit?  No      Plan:     I have personally reviewed and noted the following in the patient's chart:   Medical and social history Use of alcohol, tobacco or illicit drugs  Current medications and supplements including opioid prescriptions. Patient is currently taking opioid prescriptions. Information provided to patient regarding non-opioid alternatives. Patient advised to discuss non-opioid treatment plan with their provider. Functional ability and status Nutritional status Physical activity Advanced directives List of other physicians Hospitalizations, surgeries, and ER visits in previous 12 months Vitals Screenings to include cognitive, depression, and falls Referrals and appointments  In addition, I have reviewed and discussed with patient certain preventive protocols, quality metrics, and best  practice recommendations. A written personalized care plan for preventive services as well as general preventive health recommendations were provided to patient.     Tillie Rung, LPN   5/62/1308   Nurse Notes:  Patient due Hep-C Screening and Diabetic kidney evaluation-Urine ACR and eGFR

## 2022-07-15 NOTE — Telephone Encounter (Signed)
Noted  

## 2022-07-24 ENCOUNTER — Ambulatory Visit: Payer: Medicare HMO | Admitting: Family Medicine

## 2022-07-24 ENCOUNTER — Encounter: Payer: Self-pay | Admitting: Family Medicine

## 2022-07-24 VITALS — BP 132/80 | HR 75 | Temp 98.1°F | Ht 67.0 in | Wt 203.0 lb

## 2022-07-24 DIAGNOSIS — E1169 Type 2 diabetes mellitus with other specified complication: Secondary | ICD-10-CM | POA: Diagnosis not present

## 2022-07-24 DIAGNOSIS — F411 Generalized anxiety disorder: Secondary | ICD-10-CM | POA: Diagnosis not present

## 2022-07-24 DIAGNOSIS — M65342 Trigger finger, left ring finger: Secondary | ICD-10-CM

## 2022-07-24 DIAGNOSIS — I10 Essential (primary) hypertension: Secondary | ICD-10-CM | POA: Diagnosis not present

## 2022-07-24 DIAGNOSIS — J4520 Mild intermittent asthma, uncomplicated: Secondary | ICD-10-CM

## 2022-07-24 LAB — POCT GLYCOSYLATED HEMOGLOBIN (HGB A1C): Hemoglobin A1C: 7.4 % — AB (ref 4.0–5.6)

## 2022-07-24 LAB — HM DIABETES EYE EXAM

## 2022-07-24 NOTE — Progress Notes (Signed)
   Subjective:    Patient ID: Christina [name], female    DOB: 09/15/1965, 57 y.o.   MRN: [id number]  HPI Here to follow up on issues, and to look at her left hand. About 2 months ago she began to have pain and swelling at the base of her 4th finger. Now it sometimes locks into a flexed position. Otherwise her BP is stable. Her asthma has been controlled. Her moods are stable. Her A1c today is 7.4%.    Review of Systems  Constitutional: Negative.   Respiratory: Negative.    Cardiovascular: Negative.   Gastrointestinal: Negative.   Genitourinary: Negative.   Musculoskeletal:  Positive for arthralgias.       Objective:   Physical Exam Constitutional:      Appearance: She is not ill-appearing.     Comments: In her wheelchair   Cardiovascular:     Rate and Rhythm: Normal rate and regular rhythm.     Pulses: Normal pulses.     Heart sounds: Normal heart sounds.  Pulmonary:     Effort: Pulmonary effort is normal.     Breath sounds: Normal breath sounds.  Musculoskeletal:     Right lower leg: No edema.     Left lower leg: No edema.     Comments: She has some swelling and tenderness along the flexor tendon of her left 4th finger. ROM is full   Neurological:     Mental Status: She is alert and oriented to person, place, and time. Mental status is at baseline.  Psychiatric:        Mood and Affect: Mood normal.        Behavior: Behavior normal.        Thought Content: Thought content normal.           Assessment & Plan:  Her HTN is stable. Her depression and anxiety are well controlled. Her asthma is stable. Her diabetes is slightly out of control, and we discussed changes that she can make in there diet. We will refer to Hand Surgery for the trigger finger. We spent a total of (35   ) minutes reviewing records and discussing these issues.  Gershon Crane, MD

## 2022-07-24 NOTE — Addendum Note (Signed)
Addended by: Carola Rhine on: 07/24/2022 12:41 PM   Modules accepted: Orders

## 2022-09-01 ENCOUNTER — Other Ambulatory Visit: Payer: Self-pay | Admitting: Family Medicine

## 2022-09-01 ENCOUNTER — Telehealth: Payer: Self-pay | Admitting: Family Medicine

## 2022-09-01 NOTE — Telephone Encounter (Signed)
Pt seeking results of diabetic eye exam done on June. Says it can be loaded to mychart or a call

## 2022-09-22 ENCOUNTER — Other Ambulatory Visit: Payer: Self-pay | Admitting: Family Medicine

## 2022-09-22 DIAGNOSIS — E785 Hyperlipidemia, unspecified: Secondary | ICD-10-CM

## 2022-09-22 DIAGNOSIS — G8929 Other chronic pain: Secondary | ICD-10-CM

## 2022-10-03 ENCOUNTER — Encounter: Payer: Self-pay | Admitting: Family Medicine

## 2022-10-13 ENCOUNTER — Telehealth: Payer: Self-pay | Admitting: Family Medicine

## 2022-10-13 NOTE — Telephone Encounter (Signed)
Prescription Request  10/13/2022  LOV: 07/24/2022  What is the name of the medication or equipment?  gabapentin gabapentin (NEURONTIN) 300 MG capsule  potassium chloride potassium chloride (KLOR-CON M) 10 MEQ tablet  diltiazem diltiazem (CARDIZEM CD) 360 MG 24 hr capsule   Have you contacted your pharmacy to request a refill? Yes   Which pharmacy would you like this sent to?  [facility] - [place], [place] - [address] [place] [address] Phone: [phone] Fax: [phone]    Patient notified that their request is being sent to the clinical staff for review and that they should receive a response within 2 business days.   Please advise at Mobile [phone] (mobile)

## 2022-10-14 ENCOUNTER — Other Ambulatory Visit: Payer: Self-pay

## 2022-10-14 ENCOUNTER — Encounter: Payer: Self-pay | Admitting: Family Medicine

## 2022-10-14 MED ORDER — DILTIAZEM HCL ER COATED BEADS 360 MG PO CP24: 360.0000 mg | ORAL_CAPSULE | Freq: Every day | ORAL | 3 refills | Status: DC

## 2022-10-14 MED ORDER — GABAPENTIN 300 MG PO CAPS: ORAL_CAPSULE | ORAL | 1 refills | Status: DC

## 2022-10-14 MED ORDER — POTASSIUM CHLORIDE CRYS ER 10 MEQ PO TBCR: 10.0000 meq | EXTENDED_RELEASE_TABLET | Freq: Every day | ORAL | 2 refills | Status: DC

## 2022-10-14 NOTE — Telephone Encounter (Signed)
Pt Rx sent to [facility] per pt, Left detailed message for pt regarding this and advised to pick up her Ozempic from the office

## 2022-10-21 ENCOUNTER — Other Ambulatory Visit: Payer: Self-pay

## 2022-10-21 DIAGNOSIS — E785 Hyperlipidemia, unspecified: Secondary | ICD-10-CM

## 2022-10-21 MED ORDER — ATORVASTATIN CALCIUM 10 MG PO TABS: 10.0000 mg | ORAL_TABLET | Freq: Every day | ORAL | 1 refills | Status: DC

## 2022-10-21 MED ORDER — FENOFIBRATE 160 MG PO TABS: 160.0000 mg | ORAL_TABLET | Freq: Every day | ORAL | 1 refills | Status: DC

## 2022-10-21 MED ORDER — LEVOTHYROXINE SODIUM 50 MCG PO TABS: ORAL_TABLET | ORAL | 1 refills | Status: DC

## 2022-10-22 ENCOUNTER — Other Ambulatory Visit: Payer: Self-pay

## 2022-10-22 MED ORDER — TEMAZEPAM 30 MG PO CAPS: 30.0000 mg | ORAL_CAPSULE | Freq: Every day | ORAL | 5 refills | Status: DC

## 2022-10-30 ENCOUNTER — Encounter: Payer: Self-pay | Admitting: Family Medicine

## 2022-10-30 NOTE — Telephone Encounter (Signed)
Okay.  Please take care of this.

## 2022-10-31 ENCOUNTER — Other Ambulatory Visit: Payer: Self-pay

## 2022-10-31 DIAGNOSIS — E1169 Type 2 diabetes mellitus with other specified complication: Secondary | ICD-10-CM

## 2022-10-31 MED ORDER — FREESTYLE LIBRE 2 SENSOR MISC: 1.0000 | 3 refills | Status: DC

## 2022-11-07 ENCOUNTER — Other Ambulatory Visit: Payer: Self-pay

## 2022-11-07 MED ORDER — METFORMIN HCL ER 500 MG PO TB24: 500.0000 mg | ORAL_TABLET | Freq: Every day | ORAL | 3 refills | Status: DC

## 2022-11-10 ENCOUNTER — Telehealth: Payer: Self-pay | Admitting: Family Medicine

## 2022-11-10 NOTE — Telephone Encounter (Signed)
Prescription Request  11/10/2022  LOV: 07/24/2022  What is the name of the medication or equipment? venlafaxine XR (EFFEXOR-XR) 150 MG 24 hr capsule  Have you contacted your pharmacy to request a refill? Yes   Which pharmacy would you like this sent to?  [facility] - [place], [place] - [address] [place] [address] Phone: [phone] Fax: [phone]    Patient notified that their request is being sent to the clinical staff for review and that they should receive a response within 2 business days.   Please advise at Mobile [phone] (mobile)

## 2022-11-11 ENCOUNTER — Other Ambulatory Visit: Payer: Self-pay

## 2022-11-11 MED ORDER — VENLAFAXINE HCL ER 150 MG PO CP24: 150.0000 mg | ORAL_CAPSULE | Freq: Every morning | ORAL | 4 refills | Status: DC

## 2022-11-11 NOTE — Telephone Encounter (Signed)
Rx sent 

## 2022-11-21 ENCOUNTER — Other Ambulatory Visit (INDEPENDENT_AMBULATORY_CARE_PROVIDER_SITE_OTHER): Payer: Medicare HMO

## 2022-11-21 DIAGNOSIS — E1169 Type 2 diabetes mellitus with other specified complication: Secondary | ICD-10-CM

## 2022-11-21 NOTE — Progress Notes (Signed)
11/21/2022 Name: Christina [name] MRN: [id number] DOB: 01/25/1966  No chief complaint on file.   Christina [name] is a 57 y.o. year old female who presented for a telephone visit.   They were referred to the pharmacist by their PCP for assistance in managing diabetes.    Subjective:  Care Team: Primary Care Provider: Nelwyn Salisbury, MD ; Next Scheduled Visit: 06/2023  Medication Access/Adherence  Current Pharmacy:  [facility] - [place], [place] - [address] [place] [address] Phone: [phone] Fax: [phone]   Patient reports affordability concerns with their medications: No  Patient reports access/transportation concerns to their pharmacy: No  Patient reports adherence concerns with their medications:  No     Diabetes:  Current medications: Ozempic 2mg , Metformin XR 500mg  Medications tried in the past: Glipizide, Januvia  Current glucose readings: 200 Using [employer] under a lot of stress the past couple of months with taking care of granddaughter/daughter at home. Pursuing home care and hopes that will relieve stress and improve sugars.  Gives Ozempic on Sundays  Patient denies hypoglycemic s/sx including dizziness, shakiness, sweating. Patient denies hyperglycemic symptoms including polyuria, polydipsia, polyphagia, nocturia, neuropathy, blurred vision.  Current meal patterns:  - Breakfast: pancake with sugar free syrup, coffee - Lunch skips - Supper varies, meatloaf and mashed potatoes tonight, broccoli and cheese, brussel sprouts - Drinks water, splash 0g carb flavored water  Current physical activity: limited due to pain  Current medication access support: Ozempic through [facility]   Objective:  Lab Results  Component Value Date   HGBA1C 7.4 (A) 07/24/2022    Lab Results  Component Value Date   CREATININE 1.36 (H) 07/12/2021   BUN 27 (H) 07/12/2021   NA 140 07/12/2021   K 4.7 07/12/2021    CL 103 07/12/2021   CO2 29 07/12/2021    Lab Results  Component Value Date   CHOL 166 07/12/2021   HDL 48.50 07/12/2021   LDLCALC 97 02/10/2020   LDLDIRECT 87.0 07/12/2021   TRIG 305.0 (H) 07/12/2021   CHOLHDL 3 07/12/2021    Medications Reviewed Today     Reviewed by Sherrill Raring, RPH (Pharmacist) on 11/21/22 at 1546  Med List Status: <None>   Medication Order Taking? Sig Documenting Provider Last Dose Status Informant  aspirin 81 MG EC tablet [id number] Yes Take 81 mg by mouth daily. [provider] Taking Active   atorvastatin (LIPITOR) 10 MG tablet [id number] Yes Take 1 tablet (10 mg total) by mouth daily. Nelwyn Salisbury, MD Taking Active   cholecalciferol (VITAMIN D) 1000 UNITS tablet [id number] Yes Take 1,000 Units by mouth daily. [provider] Taking Active   Cinnamon 500 MG TABS [id number] Yes Take 2 tablets by mouth daily. [provider] Taking Active   Continuous Blood Gluc Receiver (FREESTYLE LIBRE 14 DAY READER) DEVI [id number] Yes 1 application by Does not apply route every 14 (fourteen) days. Nelwyn Salisbury, MD Taking Active   Continuous Glucose Sensor (FREESTYLE LIBRE 2 SENSOR) [place] [id number] Yes Apply 1 each topically every 14 (fourteen) days. Nelwyn Salisbury, MD Taking Active   cyclobenzaprine (FLEXERIL) 10 MG tablet [id number] Yes TAKE ONE TABLET BY MOUTH three times daily AS NEEDED FOR MUSCLE SPASMS Nelwyn Salisbury, MD Taking Active   desonide (DESOWEN) 0.05 % cream [id number]  Apply topically 2 (two) times daily. [provider]  Active   diclofenac Sodium (VOLTAREN) 1 % GEL [id number] Yes  Apply 4 g topically 4 (four) times daily. Nelwyn Salisbury, MD Taking Active   diltiazem (CARDIZEM CD) 360 MG 24 hr capsule [id number] Yes Take 1 capsule (360 mg total) by mouth daily. Nelwyn Salisbury, MD Taking Active   diphenhydrAMINE (BENADRYL) 25 mg capsule [id number] Yes Take 25 mg by mouth as needed. [provider] Taking Active    EPINEPHrine 0.3 mg/0.3 mL IJ SOAJ injection [id number]  Inject 0.3 mg into the muscle as needed for anaphylaxis. Nelwyn Salisbury, MD  Active   fenofibrate 160 MG tablet [id number] Yes Take 1 tablet (160 mg total) by mouth daily. Nelwyn Salisbury, MD Taking Active   gabapentin (NEURONTIN) 300 MG capsule [id number] Yes TAKE TWO CAPSULES BY MOUTH THREE TIMES DAILY Nelwyn Salisbury, MD Taking Active   HYDROmorphone (DILAUDID) 4 MG tablet [id number] No Take 1 tablet (4 mg total) by mouth every 6 (six) hours as needed for severe pain.  Patient not taking: Reported on 11/21/2022   Nelwyn Salisbury, MD Not Taking Active   levothyroxine (SYNTHROID) 50 MCG tablet [id number] Yes TAKE ONE TABLET BY MOUTH EVERY MORNING Nelwyn Salisbury, MD Taking Active   metFORMIN (GLUCOPHAGE-XR) 500 MG 24 hr tablet [id number] Yes Take 1 tablet (500 mg total) by mouth daily with breakfast. Nelwyn Salisbury, MD Taking Active   metoprolol succinate (TOPROL-XL) 100 MG 24 hr tablet [id number] Yes TAKE ONE TABLET BY MOUTH ONCE DAILY WITH A MEAL Nelwyn Salisbury, MD Taking Active   omeprazole (PRILOSEC) 20 MG capsule [id number] Yes Take 1 capsule (20 mg total) by mouth 2 (two) times daily. Nelwyn Salisbury, MD Taking Active   [facility], 2 MG/DOSE, 8 MG/3ML Namon Cirri [id number] Yes INJECT 2MG  INTO SKIN WEEKLY Nelwyn Salisbury, MD Taking Active     Discontinued 09/22/11 [phone] (Reorder)   potassium chloride (KLOR-CON M) 10 MEQ tablet [id number] Yes Take 1 tablet (10 mEq total) by mouth daily. Nelwyn Salisbury, MD Taking Active   promethazine (PHENERGAN) 25 MG tablet [id number]  Take 1 tablet (25 mg total) by mouth every 6 (six) hours as needed for nausea. Nelwyn Salisbury, MD  Active   temazepam (RESTORIL) 30 MG capsule [id number] Yes Take 1 capsule (30 mg total) by mouth at bedtime. Nelwyn Salisbury, MD Taking Active   traMADol Janean Sark) 50 MG tablet [id number] Yes TAKE TWO TABLETS BY MOUTH THREE TIMES DAILY AS NEEDED Nelwyn Salisbury, MD Taking Active   triamcinolone cream  (KENALOG) 0.1 % [id number]  Apply 1 application topically 2 (two) times daily. Nelwyn Salisbury, MD  Active   venlafaxine XR (EFFEXOR-XR) 150 MG 24 hr capsule [id number] Yes Take 1 capsule (150 mg total) by mouth every morning. Nelwyn Salisbury, MD Taking Active   vitamin B-12 (CYANOCOBALAMIN) 1000 MCG tablet [id number] Yes Take 1,000 mcg by mouth daily. 2500 mcg once daily [provider] Taking Active Self  vitamin E 180 MG (400 UNITS) capsule [id number] Yes Take 400 Units by mouth daily. [provider] Taking Active Self              Assessment/Plan:   Diabetes: - Currently uncontrolled - Reviewed long term cardiovascular and renal outcomes of uncontrolled blood sugar - Reviewed goal A1c, goal fasting, and goal 2 hour post prandial glucose -GFR does not allow for metformin dose increase -Recommend low-carb diet (less pancakes for breakfast) and more protein -Pursuing libre sensors through [employer] via [place], provided patient with their contact number to see if cheaper  than going through part D at [facility] - Recommend to continue current medication therapy  - Recommend to check glucose twice daily     Follow Up Plan: 6 weeks  Sherrill Raring, PharmD Clinical Pharmacist [phone]

## 2022-12-31 ENCOUNTER — Other Ambulatory Visit: Payer: Self-pay

## 2023-01-01 ENCOUNTER — Other Ambulatory Visit: Payer: PPO

## 2023-01-01 ENCOUNTER — Telehealth: Payer: Self-pay

## 2023-01-01 NOTE — Telephone Encounter (Signed)
Received pt Diabetic Retinopathy left detailed message for pt regarding results. Pt results abstracted and sent to scanning

## 2023-01-01 NOTE — Progress Notes (Signed)
   01/01/2023  Patient ID: Christina [name], female   DOB: 05-May-1965, 57 y.o.   MRN: [id number]  Contacted patient via telephone to review medications.  Assisted patient with submitted the 2025 renewal application for her Ozempic through [employer], decision pending.  Patient also would like to switch pharmacies and requests to use [facility] for future prescription refills. She updated the pharmacy via her MyChart. Message sent to PCP team about sending new rx orders.  Sherrill Raring, PharmD Clinical Pharmacist [phone]

## 2023-01-09 ENCOUNTER — Telehealth: Payer: Self-pay

## 2023-01-09 DIAGNOSIS — E785 Hyperlipidemia, unspecified: Secondary | ICD-10-CM

## 2023-01-09 DIAGNOSIS — G8929 Other chronic pain: Secondary | ICD-10-CM

## 2023-02-03 MED ORDER — VENLAFAXINE HCL ER 150 MG PO CP24: 150.0000 mg | ORAL_CAPSULE | Freq: Every morning | ORAL | 4 refills | Status: DC

## 2023-02-03 MED ORDER — LEVOTHYROXINE SODIUM 50 MCG PO TABS: ORAL_TABLET | ORAL | 1 refills | Status: DC

## 2023-02-03 MED ORDER — ATORVASTATIN CALCIUM 10 MG PO TABS: 10.0000 mg | ORAL_TABLET | Freq: Every day | ORAL | 1 refills | Status: DC

## 2023-02-03 MED ORDER — DILTIAZEM HCL ER COATED BEADS 360 MG PO CP24: 360.0000 mg | ORAL_CAPSULE | Freq: Every day | ORAL | 3 refills | Status: DC

## 2023-02-03 MED ORDER — FENOFIBRATE 160 MG PO TABS: 160.0000 mg | ORAL_TABLET | Freq: Every day | ORAL | 1 refills | Status: DC

## 2023-02-03 MED ORDER — GABAPENTIN 300 MG PO CAPS: ORAL_CAPSULE | ORAL | 1 refills | Status: DC

## 2023-02-03 MED ORDER — CYCLOBENZAPRINE HCL 10 MG PO TABS: ORAL_TABLET | ORAL | 3 refills | Status: DC

## 2023-02-03 MED ORDER — OMEPRAZOLE 20 MG PO CPDR: 20.0000 mg | DELAYED_RELEASE_CAPSULE | Freq: Two times a day (BID) | ORAL | 0 refills | Status: DC

## 2023-02-03 MED ORDER — METOPROLOL SUCCINATE ER 100 MG PO TB24: ORAL_TABLET | ORAL | 3 refills | Status: DC

## 2023-02-03 MED ORDER — METFORMIN HCL ER 500 MG PO TB24: 500.0000 mg | ORAL_TABLET | Freq: Every day | ORAL | 3 refills | Status: DC

## 2023-02-03 NOTE — Telephone Encounter (Signed)
-----   Message from Jon VEAR Lindau sent at 01/01/2023  9:19 AM EST ----- Regarding: New Pharmacy - Refill Requests Hello,  Christina [name] wants to switch to [facility] for her prescriptions and is requesting new orders be sent over to them if possible for all of her medications. Is not low on or out of any medications at this time.  Pharmacy Info: [facility] - [place], [place] - [address] [address] [place]: [phone] F: [phone]

## 2023-02-04 ENCOUNTER — Other Ambulatory Visit: Payer: Medicare HMO

## 2023-02-11 ENCOUNTER — Other Ambulatory Visit (HOSPITAL_COMMUNITY): Payer: Self-pay

## 2023-02-11 ENCOUNTER — Other Ambulatory Visit (INDEPENDENT_AMBULATORY_CARE_PROVIDER_SITE_OTHER): Payer: Self-pay

## 2023-02-11 DIAGNOSIS — E1169 Type 2 diabetes mellitus with other specified complication: Secondary | ICD-10-CM

## 2023-02-11 NOTE — Progress Notes (Signed)
02/11/2023 Name: Christina [name] MRN: [id number] DOB: 1965/02/03  Chief Complaint  Patient presents with   Diabetes   Medication Management    Christina [name] is a 57 y.o. year old female who presented for a telephone visit.   They were referred to the pharmacist by their PCP for assistance in managing diabetes.    Subjective:  Care Team: Primary Care Provider: Nelwyn Salisbury, MD ; Next Scheduled Visit: 06/2023  Medication Access/Adherence  Current Pharmacy:  [facility] - [place], [place] - [address] [place] [address] Phone: [phone] Fax: [phone]  [facility] - [place], [place] - [address] [address], [place] [address] [address] [place] [address] [place] [address] Phone: [phone] Fax: [phone]   Patient reports affordability concerns with their medications: No  Patient reports access/transportation concerns to their pharmacy: No  Patient reports adherence concerns with their medications:  No     Diabetes:  Current medications: Ozempic 2mg , Metformin XR 500mg  Medications tried in the past: Glipizide, Januvia  Current glucose readings: 180-195 before eating, reports higher recently with the holidays and not being as mindful of carbs as she should be Using [employer] under a lot of stress the past couple of months with taking care of granddaughter/daughter at home. Pursuing home care and hopes that will relieve stress and improve sugars.  Gives Ozempic on Sundays  Patient denies hypoglycemic s/sx including dizziness, shakiness, sweating. Patient denies hyperglycemic symptoms including polyuria, polydipsia, polyphagia, nocturia, neuropathy, blurred vision.  Current meal patterns:  - Breakfast: pancake with sugar free syrup, coffee - Lunch skips - Supper varies, meatloaf and mashed potatoes tonight, broccoli and cheese, brussel sprouts - Drinks water, splash 0g carb flavored water  Current  physical activity: limited due to pain  Current medication access support: Ozempic through [facility]   Objective:  Lab Results  Component Value Date   HGBA1C 7.4 (A) 07/24/2022    Lab Results  Component Value Date   CREATININE 1.36 (H) 07/12/2021   BUN 27 (H) 07/12/2021   NA 140 07/12/2021   K 4.7 07/12/2021   CL 103 07/12/2021   CO2 29 07/12/2021    Lab Results  Component Value Date   CHOL 166 07/12/2021   HDL 48.50 07/12/2021   LDLCALC 97 02/10/2020   LDLDIRECT 87.0 07/12/2021   TRIG 305.0 (H) 07/12/2021   CHOLHDL 3 07/12/2021    Medications Reviewed Today     Reviewed by Sherrill Raring, RPH (Pharmacist) on 02/11/23 at 0932  Med List Status: <None>   Medication Order Taking? Sig Documenting Provider Last Dose Status Informant  aspirin 81 MG EC tablet [id number] No Take 81 mg by mouth daily. [provider] Taking Active   atorvastatin (LIPITOR) 10 MG tablet [id number]  Take 1 tablet (10 mg total) by mouth daily. Nelwyn Salisbury, MD  Active   cholecalciferol (VITAMIN D) 1000 UNITS tablet [id number] No Take 1,000 Units by mouth daily. [provider] Taking Active   Cinnamon 500 MG TABS [id number] No Take 2 tablets by mouth daily. [provider] Taking Active   Continuous Blood Gluc Receiver (FREESTYLE LIBRE 14 DAY READER) [place] [id number] No 1 application by Does not apply route every 14 (fourteen) days. Nelwyn Salisbury, MD Taking Active   Continuous Glucose Sensor (FREESTYLE LIBRE 2 SENSOR) [place] [id number] No Apply 1 each topically every 14 (fourteen) days. Nelwyn Salisbury, MD Taking Active   cyclobenzaprine (FLEXERIL) 10 MG  tablet [id number]  TAKE ONE TABLET BY MOUTH three times daily AS NEEDED FOR MUSCLE SPASMS Nelwyn Salisbury, MD  Active   desonide (DESOWEN) 0.05 % cream [id number] No Apply topically 2 (two) times daily. [provider] Taking Active   diclofenac Sodium (VOLTAREN) 1 % GEL [id number] No Apply 4 g topically 4 (four) times daily. Nelwyn Salisbury, MD Taking Active   diltiazem (CARDIZEM CD) 360 MG 24 hr capsule [id number]  Take 1 capsule (360 mg total) by mouth daily. Nelwyn Salisbury, MD  Active   diphenhydrAMINE (BENADRYL) 25 mg capsule [id number] No Take 25 mg by mouth as needed. [provider] Taking Active   EPINEPHrine 0.3 mg/0.3 mL IJ SOAJ injection [id number] No Inject 0.3 mg into the muscle as needed for anaphylaxis. Nelwyn Salisbury, MD Taking Active   fenofibrate 160 MG tablet [id number]  Take 1 tablet (160 mg total) by mouth daily. Nelwyn Salisbury, MD  Active   gabapentin (NEURONTIN) 300 MG capsule [id number]  TAKE TWO CAPSULES BY MOUTH THREE TIMES DAILY Nelwyn Salisbury, MD  Active   HYDROmorphone (DILAUDID) 4 MG tablet [id number] No Take 1 tablet (4 mg total) by mouth every 6 (six) hours as needed for severe pain.  Patient not taking: Reported on 11/21/2022   Nelwyn Salisbury, MD Not Taking Active   levothyroxine (SYNTHROID) 50 MCG tablet [id number]  TAKE ONE TABLET BY MOUTH EVERY MORNING Nelwyn Salisbury, MD  Active   metFORMIN (GLUCOPHAGE-XR) 500 MG 24 hr tablet [id number]  Take 1 tablet (500 mg total) by mouth daily with breakfast. Nelwyn Salisbury, MD  Active   metoprolol succinate (TOPROL-XL) 100 MG 24 hr tablet [id number]  TAKE ONE TABLET BY MOUTH ONCE DAILY WITH A MEAL Nelwyn Salisbury, MD  Active   omeprazole (PRILOSEC) 20 MG capsule [id number]  Take 1 capsule (20 mg total) by mouth 2 (two) times daily. Nelwyn Salisbury, MD  Active   [facility], 2 MG/DOSE, 8 MG/3ML Namon Cirri [id number] No INJECT 2MG  INTO SKIN WEEKLY Nelwyn Salisbury, MD Taking Active   Discontinued 09/22/11 [phone] (Reorder)   potassium chloride (KLOR-CON M) 10 MEQ tablet [id number] No Take 1 tablet (10 mEq total) by mouth daily. Nelwyn Salisbury, MD Taking Active   promethazine (PHENERGAN) 25 MG tablet [id number] No Take 1 tablet (25 mg total) by mouth every 6 (six) hours as needed for nausea. Nelwyn Salisbury, MD Taking Active   temazepam (RESTORIL) 30 MG capsule [id number] No  Take 1 capsule (30 mg total) by mouth at bedtime. Nelwyn Salisbury, MD Taking Active   traMADol Janean Sark) 50 MG tablet [id number] No TAKE TWO TABLETS BY MOUTH THREE TIMES DAILY AS NEEDED Nelwyn Salisbury, MD Taking Active   triamcinolone cream (KENALOG) 0.1 % [id number] No Apply 1 application topically 2 (two) times daily. Nelwyn Salisbury, MD Taking Active   venlafaxine XR (EFFEXOR-XR) 150 MG 24 hr capsule [id number]  Take 1 capsule (150 mg total) by mouth every morning. Nelwyn Salisbury, MD  Active   vitamin B-12 (CYANOCOBALAMIN) 1000 MCG tablet [id number] No Take 1,000 mcg by mouth daily. 2500 mcg once daily [provider] Taking Active Self  vitamin E 180 MG (400 UNITS) capsule [id number] No Take 400 Units by mouth daily. [provider] Taking Active Self              Assessment/Plan:   Diabetes: - Currently uncontrolled - Reviewed long term cardiovascular and renal outcomes of uncontrolled blood  sugar - Reviewed goal A1c, goal fasting, and goal 2 hour post prandial glucose -GFR does not allow for metformin dose increase -Recommend low-carb diet (less pancakes for breakfast) and more protein -Pursuing libre sensors through [employer] via [place], provided patient with their contact number to see if cheaper than going through part D at [facility] - Recommend to continue current medication therapy, emphasized need for good diet choices -Continue current medication therapy.  -Schedule f/u with PCP (past due for updated A1C and CMP, uACR), patient declines at this time and states she will call back. -Future consideration if remains uncontrolled: Increase Metformin to 1 tab BID (gfr permitting) or consider addition of SGLT2     Follow Up Plan: 2 months  Sherrill Raring, PharmD Clinical Pharmacist [phone]

## 2023-02-17 ENCOUNTER — Telehealth: Payer: Self-pay | Admitting: Family Medicine

## 2023-02-17 NOTE — Telephone Encounter (Signed)
Please see note below.  Copied from CRM [id number]. Topic: Clinical - Prescription Issue >> Feb 17, 2023 11:08 AM Adaysia C wrote: Reason for CRM: Raina([facility] Pharmacist) called on patients behalf to ask the provider if the prescription for the medicated skin cream is supposed to be for 30 days or can they get an authorization to dispense a 90 day supply for this medicated skin cream. Please follow up with pharmacists [phone] ext.[phone]

## 2023-02-18 NOTE — Telephone Encounter (Signed)
Please give her a 90 day supply

## 2023-02-18 NOTE — Telephone Encounter (Signed)
Spoke with Raina advised of Dr Clent Ridges approval for 90 days supply on pt medicated skin cream

## 2023-03-14 ENCOUNTER — Other Ambulatory Visit: Payer: Self-pay | Admitting: Family Medicine

## 2023-03-14 DIAGNOSIS — E785 Hyperlipidemia, unspecified: Secondary | ICD-10-CM

## 2023-03-17 ENCOUNTER — Encounter: Payer: Self-pay | Admitting: Family Medicine

## 2023-03-17 ENCOUNTER — Ambulatory Visit (INDEPENDENT_AMBULATORY_CARE_PROVIDER_SITE_OTHER): Payer: Medicare HMO | Admitting: Family Medicine

## 2023-03-17 VITALS — BP 134/80 | HR 98 | Temp 98.4°F | Wt 203.0 lb

## 2023-03-17 DIAGNOSIS — F418 Other specified anxiety disorders: Secondary | ICD-10-CM

## 2023-03-17 DIAGNOSIS — E038 Other specified hypothyroidism: Secondary | ICD-10-CM | POA: Diagnosis not present

## 2023-03-17 DIAGNOSIS — K219 Gastro-esophageal reflux disease without esophagitis: Secondary | ICD-10-CM | POA: Diagnosis not present

## 2023-03-17 DIAGNOSIS — M5440 Lumbago with sciatica, unspecified side: Secondary | ICD-10-CM

## 2023-03-17 DIAGNOSIS — G8929 Other chronic pain: Secondary | ICD-10-CM

## 2023-03-17 DIAGNOSIS — E785 Hyperlipidemia, unspecified: Secondary | ICD-10-CM

## 2023-03-17 DIAGNOSIS — J4520 Mild intermittent asthma, uncomplicated: Secondary | ICD-10-CM | POA: Diagnosis not present

## 2023-03-17 DIAGNOSIS — F411 Generalized anxiety disorder: Secondary | ICD-10-CM

## 2023-03-17 DIAGNOSIS — E1169 Type 2 diabetes mellitus with other specified complication: Secondary | ICD-10-CM

## 2023-03-17 DIAGNOSIS — Z23 Encounter for immunization: Secondary | ICD-10-CM

## 2023-03-17 DIAGNOSIS — E538 Deficiency of other specified B group vitamins: Secondary | ICD-10-CM | POA: Diagnosis not present

## 2023-03-17 DIAGNOSIS — I10 Essential (primary) hypertension: Secondary | ICD-10-CM

## 2023-03-17 DIAGNOSIS — N1832 Chronic kidney disease, stage 3b: Secondary | ICD-10-CM

## 2023-03-17 LAB — CBC WITH DIFFERENTIAL/PLATELET
Basophils Absolute: 0 10*3/uL (ref 0.0–0.1)
Basophils Relative: 0.2 % (ref 0.0–3.0)
Eosinophils Absolute: 0.1 10*3/uL (ref 0.0–0.7)
Eosinophils Relative: 0.4 % (ref 0.0–5.0)
HCT: 44.5 % (ref 36.0–46.0)
Hemoglobin: 15 g/dL (ref 12.0–15.0)
Lymphocytes Relative: 11.4 % — ABNORMAL LOW (ref 12.0–46.0)
Lymphs Abs: 1.6 10*3/uL (ref 0.7–4.0)
MCHC: 33.6 g/dL (ref 30.0–36.0)
MCV: 88.2 fL (ref 78.0–100.0)
Monocytes Absolute: 0.4 10*3/uL (ref 0.1–1.0)
Monocytes Relative: 3.1 % (ref 3.0–12.0)
Neutro Abs: 11.8 10*3/uL — ABNORMAL HIGH (ref 1.4–7.7)
Neutrophils Relative %: 84.9 % — ABNORMAL HIGH (ref 43.0–77.0)
Platelets: 232 10*3/uL (ref 150.0–400.0)
RBC: 5.05 Mil/uL (ref 3.87–5.11)
RDW: 14.1 % (ref 11.5–15.5)
WBC: 13.9 10*3/uL — ABNORMAL HIGH (ref 4.0–10.5)

## 2023-03-17 LAB — BASIC METABOLIC PANEL
BUN: 22 mg/dL (ref 6–23)
CO2: 32 meq/L (ref 19–32)
Calcium: 10.2 mg/dL (ref 8.4–10.5)
Chloride: 100 meq/L (ref 96–112)
Creatinine, Ser: 1.13 mg/dL (ref 0.40–1.20)
GFR: 53.61 mL/min — ABNORMAL LOW (ref >60.00)
Glucose, Bld: 127 mg/dL — ABNORMAL HIGH (ref 70–99)
Potassium: 4.3 meq/L (ref 3.5–5.1)
Sodium: 142 meq/L (ref 135–145)

## 2023-03-17 LAB — HEPATIC FUNCTION PANEL
ALT: 18 U/L (ref 0–35)
AST: 18 U/L (ref 0–37)
Albumin: 4.5 g/dL (ref 3.5–5.2)
Alkaline Phosphatase: 51 U/L (ref 39–117)
Bilirubin, Direct: 0.1 mg/dL (ref 0.0–0.3)
Total Bilirubin: 0.4 mg/dL (ref 0.2–1.2)
Total Protein: 7.5 g/dL (ref 6.0–8.3)

## 2023-03-17 LAB — LIPID PANEL
Cholesterol: 186 mg/dL (ref 0–200)
HDL: 56.2 mg/dL (ref >39.00)
LDL Cholesterol: 67 mg/dL (ref 0–99)
NonHDL: 130.26
Total CHOL/HDL Ratio: 3
Triglycerides: 315 mg/dL — ABNORMAL HIGH (ref 0.0–149.0)
VLDL: 63 mg/dL — ABNORMAL HIGH (ref 0.0–40.0)

## 2023-03-17 LAB — VITAMIN B12: Vitamin B-12: >1537 pg/mL — ABNORMAL HIGH (ref 211–911)

## 2023-03-17 LAB — T4, FREE: Free T4: 1.1 ng/dL (ref 0.60–1.60)

## 2023-03-17 LAB — TSH: TSH: 2.93 u[IU]/mL (ref 0.35–5.50)

## 2023-03-17 LAB — HEMOGLOBIN A1C: Hgb A1c MFr Bld: 7.6 % — ABNORMAL HIGH (ref 4.6–6.5)

## 2023-03-17 LAB — T3, FREE: T3, Free: 2.8 pg/mL (ref 2.3–4.2)

## 2023-03-17 MED ORDER — TRAMADOL HCL 50 MG PO TABS: ORAL_TABLET | ORAL | 2 refills | Status: DC

## 2023-03-17 NOTE — Progress Notes (Signed)
   Subjective:    Patient ID: Christina [name], female    DOB: 12-25-1965, 58 y.o.   MRN: [id number]  HPI Here to follow up on issues. She feels well in general. Her BP is stable. Her asthma is stable. Her depression and anxiety are stable.    Review of Systems  Constitutional: Negative.   HENT: Negative.    Eyes: Negative.   Respiratory: Negative.    Cardiovascular: Negative.   Gastrointestinal: Negative.   Genitourinary:  Negative for decreased urine volume, difficulty urinating, dyspareunia, dysuria, enuresis, flank pain, frequency, hematuria, pelvic pain and urgency.  Musculoskeletal:  Positive for back pain.  Skin: Negative.   Neurological: Negative.  Negative for headaches.  Psychiatric/Behavioral: Negative.         Objective:   Physical Exam Constitutional:      General: She is not in acute distress.    Appearance: She is well-developed.     Comments: In her wheelchair   HENT:     Head: Normocephalic and atraumatic.     Right Ear: External ear normal.     Left Ear: External ear normal.     Nose: Nose normal.     Mouth/Throat:     Pharynx: No oropharyngeal exudate.  Eyes:     General: No scleral icterus.    Conjunctiva/sclera: Conjunctivae normal.     Pupils: Pupils are equal, round, and reactive to light.  Neck:     Thyroid: No thyromegaly.     Vascular: No JVD.  Cardiovascular:     Rate and Rhythm: Normal rate and regular rhythm.     Heart sounds: Normal heart sounds. No murmur heard.    No friction rub. No gallop.  Pulmonary:     Effort: Pulmonary effort is normal. No respiratory distress.     Breath sounds: Normal breath sounds. No wheezing or rales.  Chest:     Chest wall: No tenderness.  Abdominal:     General: Bowel sounds are normal. There is no distension.     Palpations: Abdomen is soft. There is no mass.     Tenderness: There is no abdominal tenderness. There is no guarding or rebound.  Musculoskeletal:        General: No tenderness. Normal range  of motion.     Cervical back: Normal range of motion and neck supple.  Lymphadenopathy:     Cervical: No cervical adenopathy.  Skin:    General: Skin is warm and dry.     Findings: No erythema or rash.  Neurological:     Mental Status: She is alert and oriented to person, place, and time.     Cranial Nerves: No cranial nerve deficit.     Motor: No abnormal muscle tone.     Coordination: Coordination normal.     Deep Tendon Reflexes: Reflexes are normal and symmetric. Reflexes normal.  Psychiatric:        Behavior: Behavior normal.        Thought Content: Thought content normal.        Judgment: Judgment normal.           Assessment & Plan:  Her depression and anxiety are stable. Her asthma and GERD are stable. Her back pain is stable. Get fasting labs to check an A1c, lipids, a thyroid panel, etc. We spent a total of (35   ) minutes reviewing records and discussing these issues.  Gershon Crane, MD

## 2023-03-17 NOTE — Addendum Note (Signed)
 Addended by: Carola Rhine on: 03/17/2023 12:07 PM   Modules accepted: Orders

## 2023-03-18 ENCOUNTER — Other Ambulatory Visit: Payer: Self-pay | Admitting: Family Medicine

## 2023-04-02 ENCOUNTER — Encounter: Payer: Self-pay | Admitting: Family Medicine

## 2023-04-06 ENCOUNTER — Other Ambulatory Visit: Payer: Self-pay

## 2023-04-06 MED ORDER — GABAPENTIN 300 MG PO CAPS: ORAL_CAPSULE | ORAL | 0 refills | Status: DC

## 2023-04-07 NOTE — Telephone Encounter (Signed)
 RX send pt aware

## 2023-04-08 ENCOUNTER — Other Ambulatory Visit (INDEPENDENT_AMBULATORY_CARE_PROVIDER_SITE_OTHER): Payer: Self-pay

## 2023-04-08 DIAGNOSIS — E1169 Type 2 diabetes mellitus with other specified complication: Secondary | ICD-10-CM

## 2023-04-08 NOTE — Progress Notes (Signed)
 04/08/2023 Name: Christina [name] MRN: [id number] DOB: 05-04-1965  Chief Complaint  Patient presents with   Diabetes   Medication Management    RORI GOAR is a 58 y.o. year old female who presented for a telephone visit.   They were referred to the pharmacist by their PCP for assistance in managing diabetes.    Subjective:  Care Team: Primary Care Provider: Nelwyn Salisbury, MD   Medication Access/Adherence  Current Pharmacy:  [facility] [address] [place] - [address] [facility] [place] [address] Phone: [phone] Fax: [phone]   Patient reports affordability concerns with their medications: No  Patient reports access/transportation concerns to their pharmacy: No  Patient reports adherence concerns with their medications:  No     Diabetes:  Current medications: Ozempic 2mg , Metformin XR 500mg  Medications tried in the past: Glipizide, Januvia  Current glucose readings: Using [employer], admits to elevated readings, admits that diet changes may not be plausible at this time with stress at home  Reports under a lot of stress the past couple of months with taking care of granddaughter/daughter at home. Pursuing home care and hopes that will relieve stress and improve sugars.  Gives Ozempic on Sundays  Patient denies hypoglycemic s/sx including dizziness, shakiness, sweating. Patient denies hyperglycemic symptoms including polyuria, polydipsia, polyphagia, nocturia, neuropathy, blurred vision.   Current physical activity: limited due to pain  Current medication access support: Ozempic through [facility]   Objective:  Lab Results  Component Value Date   HGBA1C 7.6 (H) 03/17/2023    Lab Results  Component Value Date   CREATININE 1.13 03/17/2023   BUN 22 03/17/2023   NA 142 03/17/2023   K 4.3 03/17/2023   CL 100 03/17/2023   CO2 32 03/17/2023    Lab Results  Component Value Date   CHOL 186 03/17/2023   HDL 56.20 03/17/2023    LDLCALC 67 03/17/2023   LDLDIRECT 87.0 07/12/2021   TRIG 315.0 (H) 03/17/2023   CHOLHDL 3 03/17/2023    Medications Reviewed Today     Reviewed by Sherrill Raring, RPH (Pharmacist) on 04/08/23 at 1848  Med List Status: <None>   Medication Order Taking? Sig Documenting Provider Last Dose Status Informant  aspirin 81 MG EC tablet [id number] Yes Take 81 mg by mouth daily. [provider] Taking Active   atorvastatin (LIPITOR) 10 MG tablet [id number] Yes Take 1 tablet (10 mg total) by mouth daily. Nelwyn Salisbury, MD Taking Active   cholecalciferol (VITAMIN D) 1000 UNITS tablet [id number] Yes Take 1,000 Units by mouth daily. [provider] Taking Active   Cinnamon 500 MG TABS [id number] Yes Take 2 tablets by mouth daily. [provider] Taking Active   Continuous Blood Gluc Receiver (FREESTYLE LIBRE 14 DAY READER) DEVI [id number]  1 application by Does not apply route every 14 (fourteen) days. Nelwyn Salisbury, MD  Active   Continuous Glucose Sensor (FREESTYLE LIBRE 2 SENSOR) [place] [id number] Yes Apply 1 each topically every 14 (fourteen) days. Nelwyn Salisbury, MD Taking Active            Med Note Leitha Bleak, Milas Kocher   Wed Apr 08, 2023  6:46 PM) On 3 plus sensors through [facility] via parachute portal  cyclobenzaprine (FLEXERIL) 10 MG tablet [id number] Yes TAKE ONE TABLET BY MOUTH three times daily AS NEEDED FOR MUSCLE SPASMS Nelwyn Salisbury, MD Taking Active   diltiazem (CARDIZEM CD) 360 MG 24 hr capsule [id number] Yes  Take 1 capsule (360 mg total) by mouth daily. Nelwyn Salisbury, MD Taking Active   diphenhydrAMINE (BENADRYL) 25 mg capsule [id number]  Take 25 mg by mouth as needed. [provider]  Active   EPINEPHrine 0.3 mg/0.3 mL IJ SOAJ injection [id number]  Inject 0.3 mg into the muscle as needed for anaphylaxis. Nelwyn Salisbury, MD  Active   fenofibrate 160 MG tablet [id number] Yes Take 1 tablet (160 mg total) by mouth daily. Nelwyn Salisbury, MD Taking Active    gabapentin (NEURONTIN) 300 MG capsule [id number] Yes TAKE TWO CAPSULES BY MOUTH THREE TIMES DAILY Nelwyn Salisbury, MD Taking Active   levothyroxine (SYNTHROID) 50 MCG tablet [id number] Yes TAKE 1 TABLET EVERY MORNING Nelwyn Salisbury, MD Taking Active   metFORMIN (GLUCOPHAGE-XR) 500 MG 24 hr tablet [id number] Yes Take 1 tablet (500 mg total) by mouth daily with breakfast. Nelwyn Salisbury, MD Taking Active   metoprolol succinate (TOPROL-XL) 100 MG 24 hr tablet [id number] Yes TAKE ONE TABLET BY MOUTH ONCE DAILY WITH A MEAL Nelwyn Salisbury, MD Taking Active   omeprazole (PRILOSEC) 20 MG capsule [id number] Yes Take 1 capsule (20 mg total) by mouth 2 (two) times daily. Nelwyn Salisbury, MD Taking Active   [facility], 2 MG/DOSE, 8 MG/3ML Namon Cirri [id number] Yes INJECT 2MG  INTO SKIN WEEKLY Nelwyn Salisbury, MD Taking Active            Med Note Carmelia Roller Apr 08, 2023  9:36 AM) sundays    Discontinued 09/22/11 0946 (Reorder)   potassium chloride (KLOR-CON M) 10 MEQ tablet [id number] Yes Take 1 tablet (10 mEq total) by mouth daily. Nelwyn Salisbury, MD Taking Active   promethazine (PHENERGAN) 25 MG tablet [id number] Yes Take 1 tablet (25 mg total) by mouth every 6 (six) hours as needed for nausea. Nelwyn Salisbury, MD Taking Active   temazepam (RESTORIL) 30 MG capsule [id number] Yes Take 1 capsule (30 mg total) by mouth at bedtime. Nelwyn Salisbury, MD Taking Active   traMADol Janean Sark) 50 MG tablet [id number] Yes TAKE TWO TABLETS BY MOUTH THREE TIMES DAILY AS NEEDED Nelwyn Salisbury, MD Taking Active   venlafaxine XR (EFFEXOR-XR) 150 MG 24 hr capsule [id number] Yes Take 1 capsule (150 mg total) by mouth every morning. Nelwyn Salisbury, MD Taking Active   vitamin B-12 (CYANOCOBALAMIN) 1000 MCG tablet [id number] Yes Take 1,000 mcg by mouth daily. 2500 mcg once daily [provider] Taking Active Self  vitamin E 180 MG (400 UNITS) capsule [id number] Yes Take 400 Units by mouth daily. [provider] Taking Active Self               Assessment/Plan:   Diabetes: - Currently uncontrolled - Reviewed long term cardiovascular and renal outcomes of uncontrolled blood sugar - Reviewed goal A1c, goal fasting, and goal 2 hour post prandial glucose -GFR does not allow for metformin dose increase -Recommend low-carb diet (less pancakes for breakfast) and more protein -START Farxiga 5mg  daily, patient will need cost assistance, qualifies for AZ&Me PAP, requests application via mail, routing to med access team to start process. PCP has already signed his portion      Follow Up Plan: 2 months  Sherrill Raring, PharmD Clinical Pharmacist [phone]

## 2023-04-10 ENCOUNTER — Telehealth: Payer: Self-pay | Admitting: *Deleted

## 2023-04-10 NOTE — Telephone Encounter (Signed)
 Copied from CRM [id number]. Topic: Clinical - Medication Question >> Apr 10, 2023  9:17 AM Almira Coaster wrote: Reason for CRM: Patient would like to speak with Leitha Bleak Milas Kocher the pharmacist regarding clarification on her Gabapentin 300 MG prescription and would also like to follow up on a few things from their previous conversation.

## 2023-04-13 ENCOUNTER — Other Ambulatory Visit: Payer: Self-pay | Admitting: Family Medicine

## 2023-04-14 ENCOUNTER — Other Ambulatory Visit: Payer: Self-pay | Admitting: Family Medicine

## 2023-04-15 ENCOUNTER — Telehealth: Payer: Self-pay

## 2023-04-15 MED ORDER — GABAPENTIN 300 MG PO CAPS: ORAL_CAPSULE | ORAL | 0 refills | Status: DC

## 2023-04-15 NOTE — Telephone Encounter (Signed)
 Contacted patient. She reported she was having issues refilling her gabapentin at [facility]. Contacted [facility] and they will be getting ready for her today. Unclear what issue was. Patient will reach back out if any other concerns.

## 2023-04-15 NOTE — Telephone Encounter (Signed)
 Mailing out pt application for AZ&ME Christina [name] today and will follow up in a couple of days with pt.

## 2023-04-16 ENCOUNTER — Encounter: Payer: Self-pay | Admitting: Family Medicine

## 2023-04-20 MED ORDER — TEMAZEPAM 30 MG PO CAPS: 30.0000 mg | ORAL_CAPSULE | Freq: Every day | ORAL | 5 refills | Status: DC

## 2023-04-20 NOTE — Telephone Encounter (Signed)
 done

## 2023-04-27 NOTE — Telephone Encounter (Signed)
 Following up on pt AZ&Me application left a HIPAA VM to call back.

## 2023-05-06 NOTE — Telephone Encounter (Signed)
 Following up on pt application AZ& ME pt has not return application and have left several  message.

## 2023-05-07 NOTE — Telephone Encounter (Signed)
 Following up on pt PAP AZ&ME(Farxiga)spoke with pt today,pt explain she has not check her mail,pt check if there was any mail while on the phone, she found our letter and will be fill and signing and mailing back,we will follow up in a couple of days.

## 2023-05-11 NOTE — Telephone Encounter (Signed)
 Received pt portion today ,waiting on provider portion will follow up.

## 2023-05-12 ENCOUNTER — Other Ambulatory Visit: Payer: Self-pay | Admitting: Family Medicine

## 2023-05-12 DIAGNOSIS — M5441 Lumbago with sciatica, right side: Secondary | ICD-10-CM

## 2023-05-18 NOTE — Telephone Encounter (Signed)
 Following up on pt PAP AZ&ME fax pt portion today to [employer]ME and [facility] will be faxing provider portion to [employer]ME will follow up in a few days.

## 2023-05-21 ENCOUNTER — Other Ambulatory Visit: Payer: Self-pay | Admitting: Family Medicine

## 2023-05-22 NOTE — Telephone Encounter (Signed)
 Following up with [employer] gave company a call to check status of application spoke with a representative said pt has been APPROVED for 2025, only provider needs to fax a new prescription to AZ&ME after they can mail to pt.

## 2023-05-27 ENCOUNTER — Other Ambulatory Visit (INDEPENDENT_AMBULATORY_CARE_PROVIDER_SITE_OTHER)

## 2023-05-27 DIAGNOSIS — E1169 Type 2 diabetes mellitus with other specified complication: Secondary | ICD-10-CM

## 2023-05-27 MED ORDER — FREESTYLE LIBRE 3 READER DEVI: 0 refills | Status: DC

## 2023-05-27 MED ORDER — FREESTYLE LIBRE 3 PLUS SENSOR MISC: 11 refills | Status: DC

## 2023-05-27 NOTE — Progress Notes (Signed)
   05/27/2023  Patient ID: Christina [name], female   DOB: April 12, 1965, 58 y.o.   MRN: [id number]  Contacted patient via telephone to follow up on Farxiga PAP through AZ&Me.  Patient has been approved to receive Farxiga 5mg  from company. Should receive first shipment within 7-10 business days.  Reviewed how to properly use medication once it arrives.  Patient also received letter that her freestyle libre 2 sensors are being discontinued. Requesting to switch to 3 plus, sending in orders per standing protocol.  Scheduled follow up for 1 month, patient will reach out sooner if farxiga does not arrive or if any questions.  Carnell Christian, PharmD Clinical Pharmacist [phone]

## 2023-06-24 ENCOUNTER — Other Ambulatory Visit (INDEPENDENT_AMBULATORY_CARE_PROVIDER_SITE_OTHER)

## 2023-06-24 DIAGNOSIS — E1169 Type 2 diabetes mellitus with other specified complication: Secondary | ICD-10-CM

## 2023-06-24 NOTE — Progress Notes (Signed)
 06/24/2023 Name: Christina [name] MRN: [id number] DOB: April 14, 1965  Chief Complaint  Patient presents with   Medication Management   Diabetes    Christina [name] is a 58 y.o. year old female who presented for a telephone visit.   They were referred to the pharmacist by their PCP for assistance in managing diabetes.    Subjective:  Care Team: Primary Care Provider: Donley Furth, MD   Medication Access/Adherence  Current Pharmacy:  [facility] [address] [place] - [address] [facility] [place] [address] Phone: [phone] Fax: [phone]   Patient reports affordability concerns with their medications: No  Patient reports access/transportation concerns to their pharmacy: No  Patient reports adherence concerns with their medications:  No     Diabetes:  Current medications: Ozempic  2mg , Metformin  XR 500mg , Farxiga 5mg  Medications tried in the past: Glipizide , Januvia   Current glucose readings: 154 while on phone, fasting sugar. Reports averaging around 178-200 throughout the day  Reports under a lot of stress the past couple of months with taking care of granddaughter/daughter at home. Pursuing home care and hopes that will relieve stress and improve sugars. Daughter also had a stroke this weekend so not eating well at home as she is in hospital.  Gives Ozempic  on Sundays  Just started farxiga about 7 days ago and reports she has started to see sugars slowly come down  Patient denies hypoglycemic s/sx including dizziness, shakiness, sweating. Patient denies hyperglycemic symptoms including polyuria, polydipsia, polyphagia, nocturia, neuropathy, blurred vision.   Current physical activity: limited due to pain  Current medication access support: Ozempic  through [facility]   Objective:  Lab Results  Component Value Date   HGBA1C 7.6 (H) 03/17/2023    Lab Results  Component Value Date   CREATININE 1.13 03/17/2023   BUN 22 03/17/2023   NA  142 03/17/2023   K 4.3 03/17/2023   CL 100 03/17/2023   CO2 32 03/17/2023    Lab Results  Component Value Date   CHOL 186 03/17/2023   HDL 56.20 03/17/2023   LDLCALC 67 03/17/2023   LDLDIRECT 87.0 07/12/2021   TRIG 315.0 (H) 03/17/2023   CHOLHDL 3 03/17/2023    Medications Reviewed Today     Reviewed by Carnell Christian, RPH (Pharmacist) on 06/24/23 at [phone]  Med List Status: <None>   Medication Order Taking? Sig Documenting Provider Last Dose Status Informant  aspirin 81 MG EC tablet [id number] Yes Take 81 mg by mouth daily. [provider] Taking Active   atorvastatin  (LIPITOR) 10 MG tablet [id number] Yes Take 1 tablet (10 mg total) by mouth daily. Donley Furth, MD Taking Active   cholecalciferol (VITAMIN D) 1000 UNITS tablet [id number] Yes Take 1,000 Units by mouth daily. [provider] Taking Active   Cinnamon 500 MG TABS [id number] Yes Take 2 tablets by mouth daily. [provider] Taking Active   Continuous Glucose Receiver (FREESTYLE LIBRE 3 READER) DEVI [id number]  Use to monitor blood sugar continuously with freestyle 3 plus sensors Dx E11.69 Donley Furth, MD  Active   Continuous Glucose Sensor (FREESTYLE LIBRE 3 PLUS SENSOR) [place] [id number] Yes Use to monitor blood sugar continuously. Change sensor every 15 days. Dx: E11.69 Donley Furth, MD Taking Active   cyclobenzaprine  (FLEXERIL ) 10 MG tablet [id number] Yes TAKE ONE TABLET BY MOUTH THREE TIMES DAILY AS NEEDED FOR MUSCLE SPASMS Donley Furth, MD Taking Active   dapagliflozin propanediol (FARXIGA) 5 MG TABS tablet [id number]  Yes Take 5 mg by mouth daily. Via AZ&Me PAP [provider] Taking Active   diltiazem  (CARDIZEM  CD) 360 MG 24 hr capsule [id number] Yes Take 1 capsule (360 mg total) by mouth daily. Donley Furth, MD Taking Active   diphenhydrAMINE (BENADRYL) 25 mg capsule [id number]  Take 25 mg by mouth as needed. [provider]  Active   EPINEPHrine  0.3 mg/0.3 mL IJ SOAJ  injection [id number]  Inject 0.3 mg into the muscle as needed for anaphylaxis. Donley Furth, MD  Active   fenofibrate  160 MG tablet [id number] Yes Take 1 tablet (160 mg total) by mouth daily. Donley Furth, MD Taking Active   gabapentin  (NEURONTIN ) 300 MG capsule [id number] Yes TAKE TWO CAPSULES BY MOUTH THREE TIMES DAILY Donley Furth, MD Taking Active   levothyroxine  (SYNTHROID ) 50 MCG tablet [id number] Yes TAKE 1 TABLET EVERY MORNING Fry, Stephen A, MD Taking Active   metFORMIN  (GLUCOPHAGE -XR) 500 MG 24 hr tablet [id number] Yes Take 1 tablet (500 mg total) by mouth daily with breakfast. Donley Furth, MD Taking Active   metoprolol  succinate (TOPROL -XL) 100 MG 24 hr tablet [id number] Yes TAKE ONE TABLET BY MOUTH ONCE DAILY WITH A MEAL Donley Furth, MD Taking Active   omeprazole  (PRILOSEC) 20 MG capsule [id number] Yes TAKE 1 CAPSULE (20 MG TOTAL) BY MOUTH 2 (TWO) TIMES DAILY. Donley Furth, MD Taking Active   OZEMPIC , 2 MG/DOSE, 8 MG/3ML Charisse Conception [id number] Yes INJECT 2MG  INTO SKIN WEEKLY Donley Furth, MD Taking Active            Med Note Walton Guppy Apr 08, 2023  9:36 AM) sundays    Discontinued 09/22/11 0946 (Reorder)   potassium chloride  (KLOR-CON  M) 10 MEQ tablet [id number] Yes TAKE 1 TABLET EVERY DAY Donley Furth, MD Taking Active   promethazine  (PHENERGAN ) 25 MG tablet [id number] Yes Take 1 tablet (25 mg total) by mouth every 6 (six) hours as needed for nausea. Donley Furth, MD Taking Active   temazepam  (RESTORIL ) 30 MG capsule [id number] Yes Take 1 capsule (30 mg total) by mouth at bedtime. Donley Furth, MD Taking Active   traMADol  (ULTRAM ) 50 MG tablet [id number] Yes TAKE TWO TABLETS BY MOUTH THREE TIMES DAILY AS NEEDED Donley Furth, MD Taking Active   venlafaxine  XR (EFFEXOR -XR) 150 MG 24 hr capsule [id number] Yes Take 1 capsule (150 mg total) by mouth every morning. Donley Furth, MD Taking Active   vitamin B-12 (CYANOCOBALAMIN ) 1000 MCG tablet [id number] Yes Take 1,000 mcg  by mouth daily. 2500 mcg once daily [provider] Taking Active Self  vitamin E 180 MG (400 UNITS) capsule [id number] Yes Take 400 Units by mouth daily. [provider] Taking Active Self              Assessment/Plan:   Diabetes: - Currently uncontrolled - Reviewed long term cardiovascular and renal outcomes of uncontrolled blood sugar - Reviewed goal A1c, goal fasting, and goal 2 hour post prandial glucose -GFR does not allow for metformin  dose increase -Recommend low-carb diet (less pancakes for breakfast) and more protein -Continue current medication therapy      Follow Up Plan: 1 month  Carnell Christian, PharmD Clinical Pharmacist [phone]

## 2023-07-02 DIAGNOSIS — M65331 Trigger finger, right middle finger: Secondary | ICD-10-CM | POA: Diagnosis not present

## 2023-07-03 ENCOUNTER — Telehealth: Payer: Self-pay

## 2023-07-03 NOTE — Telephone Encounter (Signed)
 Left several messages for pt to pick up Ozempic  from the office.

## 2023-07-15 ENCOUNTER — Encounter: Payer: Self-pay | Admitting: Family Medicine

## 2023-07-16 ENCOUNTER — Other Ambulatory Visit: Payer: Self-pay

## 2023-07-16 DIAGNOSIS — E785 Hyperlipidemia, unspecified: Secondary | ICD-10-CM

## 2023-07-16 MED ORDER — ATORVASTATIN CALCIUM 10 MG PO TABS
10.0000 mg | ORAL_TABLET | Freq: Every day | ORAL | 1 refills | Status: AC
Start: 2023-07-16 — End: ?

## 2023-07-16 MED ORDER — FENOFIBRATE 160 MG PO TABS: 160.0000 mg | ORAL_TABLET | Freq: Every day | ORAL | 1 refills | Status: DC

## 2023-07-17 ENCOUNTER — Ambulatory Visit: Payer: PPO

## 2023-07-17 VITALS — Ht 62.0 in | Wt 203.0 lb

## 2023-07-17 DIAGNOSIS — Z1231 Encounter for screening mammogram for malignant neoplasm of breast: Secondary | ICD-10-CM

## 2023-07-17 DIAGNOSIS — Z0000 Encounter for general adult medical examination without abnormal findings: Secondary | ICD-10-CM

## 2023-07-17 NOTE — Progress Notes (Signed)
 Subjective:   Christina [name] is a 58 y.o. who presents for a Medicare Wellness preventive visit.  As a reminder, Annual Wellness Visits don't include a physical exam, and some assessments may be limited, especially if this visit is performed virtually. We may recommend an in-person follow-up visit with your provider if needed.  Visit Complete: Virtual I connected with  Christina [name] on 07/17/23 by a audio enabled telemedicine application and verified that I am speaking with the correct person using two identifiers.  Patient Location: Home  Provider Location: Home Office  I discussed the limitations of evaluation and management by telemedicine. The patient expressed understanding and agreed to proceed.  Vital Signs: Because this visit was a virtual/telehealth visit, some criteria may be missing or patient reported. Any vitals not documented were not able to be obtained and vitals that have been documented are patient reported.    Persons Participating in Visit: Patient.  AWV Questionnaire: No: Patient Medicare AWV questionnaire was not completed prior to this visit.  Cardiac Risk Factors include: advanced age (>18men, >75 women);diabetes mellitus;hypertension;sedentary lifestyle     Objective:    Today's Vitals   07/17/23 0808  Weight: 203 lb (92.1 kg)  Height: 5' 2 (1.575 m)   Body mass index is 37.13 kg/m.     07/17/2023    8:25 AM 07/14/2022    8:41 AM 07/09/2021    9:18 AM 06/14/2020    1:35 PM 05/02/2015    1:16 PM 05/25/2014    1:12 PM  Advanced Directives  Does Patient Have a Medical Advance Directive? No No Yes Yes No  Yes   Type of Chief of Staff of [place];Living will    Does patient want to make changes to medical advance directive?   No - Patient declined No - Patient declined    Copy of Healthcare Power of Attorney in Chart?   No - copy requested No - copy requested    Would patient like information on  creating a medical advance directive? No - Patient declined No - Patient declined No - Patient declined        Data saved with a previous flowsheet row definition    Current Medications (verified) Outpatient Encounter Medications as of 07/17/2023  Medication Sig   aspirin 81 MG EC tablet Take 81 mg by mouth daily.   atorvastatin  (LIPITOR) 10 MG tablet Take 1 tablet (10 mg total) by mouth daily.   cholecalciferol (VITAMIN D) 1000 UNITS tablet Take 1,000 Units by mouth daily.   Cinnamon 500 MG TABS Take 2 tablets by mouth daily.   Continuous Glucose Receiver (FREESTYLE LIBRE 3 READER) DEVI Use to monitor blood sugar continuously with freestyle 3 plus sensors Dx E11.69   Continuous Glucose Sensor (FREESTYLE LIBRE 3 PLUS SENSOR) MISC Use to monitor blood sugar continuously. Change sensor every 15 days. Dx: E11.69   cyclobenzaprine  (FLEXERIL ) 10 MG tablet TAKE ONE TABLET BY MOUTH THREE TIMES DAILY AS NEEDED FOR MUSCLE SPASMS   dapagliflozin propanediol (FARXIGA) 5 MG TABS tablet Take 5 mg by mouth daily. Via AZ&Me PAP   diltiazem  (CARDIZEM  CD) 360 MG 24 hr capsule Take 1 capsule (360 mg total) by mouth daily.   diphenhydrAMINE (BENADRYL) 25 mg capsule Take 25 mg by mouth as needed.   EPINEPHrine  0.3 mg/0.3 mL IJ SOAJ injection Inject 0.3 mg into the muscle as needed for anaphylaxis.   fenofibrate  160 MG tablet Take 1 tablet (160 mg  total) by mouth daily.   gabapentin  (NEURONTIN ) 300 MG capsule TAKE TWO CAPSULES BY MOUTH THREE TIMES DAILY   levothyroxine  (SYNTHROID ) 50 MCG tablet TAKE 1 TABLET EVERY MORNING   metFORMIN  (GLUCOPHAGE -XR) 500 MG 24 hr tablet Take 1 tablet (500 mg total) by mouth daily with breakfast.   metoprolol  succinate (TOPROL -XL) 100 MG 24 hr tablet TAKE ONE TABLET BY MOUTH ONCE DAILY WITH A MEAL   omeprazole  (PRILOSEC) 20 MG capsule TAKE 1 CAPSULE (20 MG TOTAL) BY MOUTH 2 (TWO) TIMES DAILY.   OZEMPIC , 2 MG/DOSE, 8 MG/3ML SOPN INJECT 2MG  INTO SKIN WEEKLY   potassium chloride   (KLOR-CON  M) 10 MEQ tablet TAKE 1 TABLET EVERY DAY   promethazine  (PHENERGAN ) 25 MG tablet Take 1 tablet (25 mg total) by mouth every 6 (six) hours as needed for nausea.   temazepam  (RESTORIL ) 30 MG capsule Take 1 capsule (30 mg total) by mouth at bedtime.   traMADol  (ULTRAM ) 50 MG tablet TAKE TWO TABLETS BY MOUTH THREE TIMES DAILY AS NEEDED   venlafaxine  XR (EFFEXOR -XR) 150 MG 24 hr capsule Take 1 capsule (150 mg total) by mouth every morning.   vitamin B-12 (CYANOCOBALAMIN ) 1000 MCG tablet Take 1,000 mcg by mouth daily. 2500 mcg once daily   vitamin E 180 MG (400 UNITS) capsule Take 400 Units by mouth daily.   [DISCONTINUED] potassium chloride  (KLOR-CON  10) 10 MEQ CR tablet Take 1 tablet (10 mEq total) by mouth daily.   No facility-administered encounter medications on file as of 07/17/2023.    Allergies (verified) Antihistamines, loratadine-type; Bee venom; Clindamycin/lincomycin; Codeine; Doxycycline; Lisinopril; Metronidazole ; Ondansetron hcl; Tylox [oxycodone-acetaminophen]; Vancomycin; Albuterol; Benadryl [diphenhydramine]; Lidocaine; and Oxycodone-acetaminophen   History: Past Medical History:  Diagnosis Date   Allergy    Angioedema    Asthma    Blood transfusion without reported diagnosis    with both vaginal deliveries bleed out and had blood   CKD (chronic kidney disease) stage 3, GFR 30-59 ml/min (HCC)    sees Dr. Adriane Hora at [facility]   DDD (degenerative disc disease), lumbar    Diabetes mellitus    Eczema    GERD (gastroesophageal reflux disease)    Hx of colonic polyps    Hyperlipidemia    Hypertension    Interstitial cystitis    per Dr. Bosie Bye   Low back pain    Meningitis    Seizures (HCC)    seizures with brain tumor-no sz since 1984   Thyroid  disease    Past Surgical History:  Procedure Laterality Date   ABDOMINAL HYSTERECTOMY     BRAIN TUMOR EXCISION     benign 1984   CHOLECYSTECTOMY     COLONOSCOPY  05/02/2015   per Dr. Leonia Raman,  internal hemorrhoids but no polyps, repeat in 10 yrs    cystic mass  04/30/2009   remval from abdomen per Dr. Keenan Pastor at [facility]   POLYPECTOMY     SVD X 2     TONSILLECTOMY     TUBAL LIGATION     bilateral   Family History  Problem Relation Age of Onset   Hypertension Other        family hx   Stroke Mother    Rheum arthritis Mother    COPD Mother    Stroke Father    Colon cancer Father    Colon polyps Neg Hx    Social History   Socioeconomic History   Marital status: Married    Spouse name: Not on file  Number of children: Not on file   Years of education: Not on file   Highest education level: Not on file  Occupational History   Not on file  Tobacco Use   Smoking status: Former   Smokeless tobacco: Never  Substance and Sexual Activity   Alcohol use: No    Alcohol/week: 0.0 standard drinks of alcohol   Drug use: No   Sexual activity: Not on file  Other Topics Concern   Not on file  Social History Narrative   Not on file   Social Drivers of Health   Financial Resource Strain: Low Risk  (07/17/2023)   Overall Financial Resource Strain (CARDIA)    Difficulty of Paying Living Expenses: Not hard at all  Food Insecurity: No Food Insecurity (07/17/2023)   Hunger Vital Sign    Worried About Running Out of Food in the Last Year: Never true    Ran Out of Food in the Last Year: Never true  Transportation Needs: No Transportation Needs (07/17/2023)   PRAPARE - Administrator, Civil Service (Medical): No    Lack of Transportation (Non-Medical): No  Physical Activity: Inactive (07/17/2023)   Exercise Vital Sign    Days of Exercise per Week: 0 days    Minutes of Exercise per Session: 0 min  Stress: No Stress Concern Present (07/17/2023)   [employer] of Occupational Health - Occupational Stress Questionnaire    Feeling of Stress: Not at all  Social Connections: Socially Integrated (07/17/2023)   Social Connection and Isolation Panel     Frequency of Communication with Friends and Family: More than three times a week    Frequency of Social Gatherings with Friends and Family: More than three times a week    Attends Religious Services: More than 4 times per year    Active Member of [employer] or Organizations: Yes    Attends Engineer, structural: More than 4 times per year    Marital Status: Married    Tobacco Counseling Counseling given: Not Answered    Clinical Intake:  Pre-visit preparation completed: Yes  Pain : No/denies pain     BMI - recorded: 37.13 Nutritional Status: BMI > 30  Obese Nutritional Risks: None Diabetes: Yes CBG done?: Yes (CBG 139 Per patient) CBG resulted in Enter/ Edit results?: Yes Did pt. bring in CBG monitor from home?: No  Lab Results  Component Value Date   HGBA1C 7.6 (H) 03/17/2023   HGBA1C 7.4 (A) 07/24/2022   HGBA1C 6.5 (A) 01/17/2022     How often do you need to have someone help you when you read instructions, pamphlets, or other written materials from your doctor or pharmacy?: 3 - Sometimes (Husband assist)  Interpreter Needed?: No  Information entered by :: Farris Hong LPN   Activities of Daily Living     07/17/2023    8:22 AM  In your present state of health, do you have any difficulty performing the following activities:  Hearing? 0  Vision? 0  Difficulty concentrating or making decisions? 0  Walking or climbing stairs? 1  Comment Uses Tree surgeon and Wheelchair  Dressing or bathing? 0  Doing errands, shopping? 1  Comment Husband assist  Preparing Food and eating ? Y  Comment Husband assist  Using the Toilet? N  In the past six months, have you accidently leaked urine? N  Do you have problems with loss of bowel control? N  Managing your Medications? N  Managing your Finances? Y  Comment  Husband assist  Housekeeping or managing your Housekeeping? Y  Comment Husband assist    Patient Care Team: Donley Furth, MD as PCP - General Zilphia Hilt,  Daria Eddy, [facility] (Pharmacist)  I have updated your Care Teams any recent Medical Services you may have received from other providers in the past year.     Assessment:   This is a routine wellness examination for Breta.  Hearing/Vision screen Hearing Screening - Comments:: Denies hearing difficulties   Vision Screening - Comments:: Wears rx glasses - up to date with routine eye exams with  [facility]   Goals Addressed               This Visit's Progress     Get more active (pt-stated)         Depression Screen     07/17/2023    8:21 AM 07/14/2022    8:33 AM 07/12/2021   11:20 AM 07/09/2021    9:00 AM 11/16/2020    3:29 PM 06/14/2020    1:37 PM 06/14/2020    1:35 PM  PHQ 2/9 Scores  PHQ - 2 Score 0 0 2 0 5 0 0  PHQ- 9 Score   15  15      Fall Risk     07/17/2023    8:24 AM 07/13/2022   11:39 PM 07/12/2021   11:20 AM 07/09/2021    9:10 AM 11/16/2020    1:12 PM  Fall Risk   Falls in the past year? 0 0 0 1 1  Number falls in past yr: 0 0 0 0 0  Injury with Fall? 0 0 0 1 1  Comment    Bruised left  knee and Toe. Followed by Medicial attention Knee pain  Risk for fall due to : No Fall Risks No Fall Risks No Fall Risks Other (Comment) History of fall(s)  Risk for fall due to: Comment    Dx Spinal Stenosis. Followed by PCP   Follow up Falls evaluation completed Falls prevention discussed Falls evaluation completed        Data saved with a previous flowsheet row definition    MEDICARE RISK AT HOME:  Medicare Risk at Home Any stairs in or around the home?: No If so, are there any without handrails?: No Home free of loose throw rugs in walkways, pet beds, electrical cords, etc?: Yes Adequate lighting in your home to reduce risk of falls?: Yes Life alert?: No Use of a cane, walker or w/c?: Yes Grab bars in the bathroom?: No Shower chair or bench in shower?: Yes Elevated toilet seat or a handicapped toilet?: No  TIMED UP AND GO:  Was the test performed?   No  Cognitive Function: 6CIT completed        07/17/2023    8:25 AM 07/14/2022    8:41 AM 07/09/2021    9:19 AM  6CIT Screen  What Year? 0 points 0 points 0 points  What month? 0 points 0 points 0 points  What time? 0 points 0 points 0 points  Count back from 20 0 points 0 points 0 points  Months in reverse 0 points 0 points 0 points  Repeat phrase 0 points 0 points 0 points  Total Score 0 points 0 points 0 points    Immunizations Immunization History  Administered Date(s) Administered   Fluad Trivalent(High Dose 65+) 03/17/2023   Influenza Whole 12/27/2004, 11/27/2008   Influenza,inj,Quad PF,6+ Mos 10/08/2012, 10/05/2013, 10/06/2014, 01/04/2016, 10/29/2016, 12/23/2017, 11/30/2018, 11/07/2019,  11/16/2020, 01/17/2022   PFIZER(Purple Top)SARS-COV-2 Vaccination 05/20/2019, 06/15/2019   Pneumococcal Polysaccharide-23 01/16/2020   Tdap 04/01/2017    Screening Tests Health Maintenance  Topic Date Due   FOOT EXAM  Never done   Hepatitis C Screening  Never done   Cervical Cancer Screening (HPV/Pap Cotest)  Never done   Zoster Vaccines- Shingrix (1 of 2) Never done   Diabetic kidney evaluation - Urine ACR  02/22/2015   Pneumococcal Vaccine 16-61 Years old (2 of 2 - PCV) 01/15/2021   MAMMOGRAM  07/05/2021   COVID-19 Vaccine (3 - 2024-25 season) 09/28/2022   OPHTHALMOLOGY EXAM  07/24/2023   INFLUENZA VACCINE  08/28/2023   HEMOGLOBIN A1C  09/14/2023   Diabetic kidney evaluation - eGFR measurement  03/16/2024   Medicare Annual Wellness (AWV)  07/16/2024   Colonoscopy  05/01/2025   DTaP/Tdap/Td (2 - Td or Tdap) 04/02/2027   HIV Screening  Completed   HPV VACCINES  Aged Out   Meningococcal B Vaccine  Aged Out    Health Maintenance  Health Maintenance Due  Topic Date Due   FOOT EXAM  Never done   Hepatitis C Screening  Never done   Cervical Cancer Screening (HPV/Pap Cotest)  Never done   Zoster Vaccines- Shingrix (1 of 2) Never done   Diabetic kidney evaluation - Urine ACR   02/22/2015   Pneumococcal Vaccine 20-28 Years old (2 of 2 - PCV) 01/15/2021   MAMMOGRAM  07/05/2021   COVID-19 Vaccine (3 - 2024-25 season) 09/28/2022   Health Maintenance Items Addressed: Mammogram ordered  Additional Screening:  Vision Screening: Recommended annual ophthalmology exams for early detection of glaucoma and other disorders of the eye. Would you like a referral to an eye doctor? No    Dental Screening: Recommended annual dental exams for proper oral hygiene  Community Resource Referral / Chronic Care Management: CRR required this visit?  No   CCM required this visit?  No   Plan:    I have personally reviewed and noted the following in the patient's chart:   Medical and social history Use of alcohol, tobacco or illicit drugs  Current medications and supplements including opioid prescriptions. Patient is currently taking opioid prescriptions. Information provided to patient regarding non-opioid alternatives. Patient advised to discuss non-opioid treatment plan with their provider. Functional ability and status Nutritional status Physical activity Advanced directives List of other physicians Hospitalizations, surgeries, and ER visits in previous 12 months Vitals Screenings to include cognitive, depression, and falls Referrals and appointments  In addition, I have reviewed and discussed with patient certain preventive protocols, quality metrics, and best practice recommendations. A written personalized care plan for preventive services as well as general preventive health recommendations were provided to patient.   Dewayne Ford, LPN   [id number]/61/0960   After Visit Summary: (MyChart) Due to this being a telephonic visit, the after visit summary with patients personalized plan was offered to patient via MyChart   Notes: Nothing significant to report at this time.

## 2023-07-17 NOTE — Patient Instructions (Addendum)
 Christina [name] , Thank you for taking time out of your busy schedule to complete your Annual Wellness Visit with me. I enjoyed our conversation and look forward to speaking with you again next year. I, as well as your care team,  appreciate your ongoing commitment to your health goals. Please review the following plan we discussed and let me know if I can assist you in the future. Your Game plan/ To Do List    Referrals: If you haven't heard from the office you've been referred to, please reach out to them at the phone provided.   Follow up Visits: Next Medicare AWV with our clinical staff: 07/22/24 @ 8:40a   Have you seen your provider in the last 6 months (3 months if uncontrolled diabetes)? 03/17/23 Next Office Visit with your provider:    Clinician Recommendations:  Aim for 30 minutes of exercise or brisk walking, 6-8 glasses of water, and 5 servings of fruits and vegetables each day.       This is a list of the screening recommended for you and due dates:  Health Maintenance  Topic Date Due   Complete foot exam   Never done   Hepatitis C Screening  Never done   Pap with HPV screening  Never done   Zoster (Shingles) Vaccine (1 of 2) Never done   Yearly kidney health urinalysis for diabetes  02/22/2015   Pneumococcal Vaccination (2 of 2 - PCV) 01/15/2021   Mammogram  07/05/2021   COVID-19 Vaccine (3 - 2024-25 season) 09/28/2022   Eye exam for diabetics  07/24/2023   Flu Shot  08/28/2023   Hemoglobin A1C  09/14/2023   Yearly kidney function blood test for diabetes  03/16/2024   Medicare Annual Wellness Visit  07/16/2024   Colon Cancer Screening  05/01/2025   DTaP/Tdap/Td vaccine (2 - Td or Tdap) 04/02/2027   HIV Screening  Completed   HPV Vaccine  Aged Out   Meningitis B Vaccine  Aged Out   Opioid Pain Medicine Management Opioids are powerful medicines that are used to treat moderate to severe pain. When used for short periods of time, they can help you to: Sleep better. Do better  in physical or occupational therapy. Feel better in the first few days after an injury. Recover from surgery. Opioids should be taken with the supervision of a trained health care provider. They should be taken for the shortest period of time possible. This is because opioids can be addictive, and the longer you take opioids, the greater your risk of addiction. This addiction can also be called opioid use disorder. What are the risks? Using opioid pain medicines for longer than 3 days increases your risk of side effects. Side effects include: Constipation. Nausea and vomiting. Breathing difficulties (respiratory depression). Drowsiness. Confusion. Opioid use disorder. Itching. Taking opioid pain medicine for a long period of time can affect your ability to do daily tasks. It also puts you at risk for: Motor vehicle crashes. Depression. Suicide. Heart attack. Overdose, which can be life-threatening. What is a pain treatment plan? A pain treatment plan is an agreement between you and your health care provider. Pain is unique to each person, and treatments vary depending on your condition. To manage your pain, you and your health care provider need to work together. To help you do this: Discuss the goals of your treatment, including how much pain you might expect to have and how you will manage the pain. Review the risks and benefits of taking  opioid medicines. Remember that a good treatment plan uses more than one approach and minimizes the chance of side effects. Be honest about the amount of medicines you take and about any drug or alcohol use. Get pain medicine prescriptions from only one health care provider. Pain can be managed with many types of alternative treatments. Ask your health care provider to refer you to one or more specialists who can help you manage pain through: Physical or occupational therapy. Counseling (cognitive behavioral therapy). Good  nutrition. Biofeedback. Massage. Meditation. Non-opioid medicine. Following a gentle exercise program. How to use opioid pain medicine Taking medicine Take your pain medicine exactly as told by your health care provider. Take it only when you need it. If your pain gets less severe, you may take less than your prescribed dose if your health care provider approves. If you are not having pain, do nottake pain medicine unless your health care provider tells you to take it. If your pain is severe, do nottry to treat it yourself by taking more pills than instructed on your prescription. Contact your health care provider for help. Write down the times when you take your pain medicine. It is easy to become confused while on pain medicine. Writing the time can help you avoid overdose. Take other over-the-counter or prescription medicines only as told by your health care provider. Keeping yourself and others safe  While you are taking opioid pain medicine: Do not drive, use machinery, or power tools. Do not sign legal documents. Do not drink alcohol. Do not take sleeping pills. Do not supervise children by yourself. Do not do activities that require climbing or being in high places. Do not go to a lake, river, ocean, spa, or swimming pool. Do not share your pain medicine with anyone. Keep pain medicine in a locked cabinet or in a secure area where pets and children cannot reach it. Stopping your use of opioids If you have been taking opioid medicine for more than a few weeks, you may need to slowly decrease (taper) how much you take until you stop completely. Tapering your use of opioids can decrease your risk of symptoms of withdrawal, such as: Pain and cramping in the abdomen. Nausea. Sweating. Sleepiness. Restlessness. Uncontrollable shaking (tremors). Cravings for the medicine. Do not attempt to taper your use of opioids on your own. Talk with your health care provider about how to do  this. Your health care provider may prescribe a step-down schedule based on how much medicine you are taking and how long you have been taking it. Getting rid of leftover pills Do not save any leftover pills. Get rid of leftover pills safely by: Taking the medicine to a prescription take-back program. This is usually offered by the county or law enforcement. Bringing them to a pharmacy that has a drug disposal container. Flushing them down the toilet. Check the label or package insert of your medicine to see whether this is safe to do. Throwing them out in the trash. Check the label or package insert of your medicine to see whether this is safe to do. If it is safe to throw it out, remove the medicine from the original container, put it into a sealable bag or container, and mix it with used coffee grounds, food scraps, dirt, or cat litter before putting it in the trash. Follow these instructions at home: Activity Do exercises as told by your health care provider. Avoid activities that make your pain worse. Return to your normal activities  as told by your health care provider. Ask your health care provider what activities are safe for you. General instructions You may need to take these actions to prevent or treat constipation: Drink enough fluid to keep your urine pale yellow. Take over-the-counter or prescription medicines. Eat foods that are high in fiber, such as beans, whole grains, and fresh fruits and vegetables. Limit foods that are high in fat and processed sugars, such as fried or sweet foods. Keep all follow-up visits. This is important. Where to find support If you have been taking opioids for a long time, you may benefit from receiving support for quitting from a local support group or counselor. Ask your health care provider for a referral to these resources in your area. Where to find more information Centers for Disease Control and Prevention (CDC): FootballExhibition.com.br [place] Food and  Drug Administration (FDA): PumpkinSearch.com.ee Get help right away if: You may have taken too much of an opioid (overdosed). Common symptoms of an overdose: Your breathing is slower or more shallow than normal. You have a very slow heartbeat (pulse). You have slurred speech. You have nausea and vomiting. Your pupils become very small. You have other potential symptoms: You are very confused. You faint or feel like you will faint. You have cold, clammy skin. You have blue lips or fingernails. You have thoughts of harming yourself or harming others. These symptoms may represent a serious problem that is an emergency. Do not wait to see if the symptoms will go away. Get medical help right away. Call your local emergency services (911 in the [place] Do not drive yourself to the hospital.  If you ever feel like you may hurt yourself or others, or have thoughts about taking your own life, get help right away. Go to your nearest emergency department or: Call your local emergency services (911 in the [place] Call the [facility] (203 523 9958 in the [place] Call a suicide crisis helpline, such as the National Suicide Prevention Lifeline at 614-070-5414 or 988 in the [place] This is open 24 hours a day in the [place] If you're a Veteran: Call 988 and press 1. This is open 24 hours a day. Text the [employer] at (331)514-7327. Summary Opioid medicines can help you manage moderate to severe pain for a short period of time. A pain treatment plan is an agreement between you and your health care provider. Discuss the goals of your treatment, including how much pain you might expect to have and how you will manage the pain. If you think that you or someone else may have taken too much of an opioid, get medical help right away. This information is not intended to replace advice given to you by your health care provider. Make sure you discuss any questions you have with your health care  provider. Document Revised: 10/20/2022 Document Reviewed: 04/25/2020 Elsevier Patient Education  2024 [employer]. Advanced directives: (Declined) Advance directive discussed with you today. Even though you declined this today, please call our office should you change your mind, and we can give you the proper paperwork for you to fill out. Advance Care Planning is important because it:  [x]  Makes sure you receive the medical care that is consistent with your values, goals, and preferences  [x]  It provides guidance to your family and loved ones and reduces their decisional burden about whether or not they are making the right decisions based on your wishes.  Follow the link provided in your after visit  summary or read over the paperwork we have mailed to you to help you started getting your Advance Directives in place. If you need assistance in completing these, please reach out to us  so that we can help you!  See attachments for Preventive Care and Fall Prevention Tips.

## 2023-07-22 ENCOUNTER — Other Ambulatory Visit (INDEPENDENT_AMBULATORY_CARE_PROVIDER_SITE_OTHER)

## 2023-07-22 DIAGNOSIS — E1169 Type 2 diabetes mellitus with other specified complication: Secondary | ICD-10-CM

## 2023-07-22 NOTE — Progress Notes (Signed)
 07/22/2023 Name: Christina [name] MRN: [id number] DOB: August 05, 1965  Chief Complaint  Patient presents with   Medication Management   Diabetes    Christina [name] is a 58 y.o. year old female who presented for a telephone visit.   They were referred to the pharmacist by their PCP for assistance in managing diabetes.    Subjective:  Care Team: Primary Care Provider: Johnny Garnette LABOR, MD   Medication Access/Adherence  Current Pharmacy:  [facility] [address] [place] - [address] [facility] [place] [address] Phone: [phone] Fax: [phone]   Patient reports affordability concerns with their medications: No  Patient reports access/transportation concerns to their pharmacy: No  Patient reports adherence concerns with their medications:  No     Diabetes:  Current medications: Ozempic  2mg , Metformin  XR 500mg , Farxiga 5mg  Medications tried in the past: Glipizide , Januvia   Current glucose readings: 112 while on the phone this morning fasting, reports her numbers have improved over the last month and mostly seeing under 130 fasting. Unable to pull up her report to provide sugar averages, does not use app for remote connection  Reports that her daughter has now been placed in a nursing home, which has given patient more time to focus on healthier eating and taking care of her health  Gives Ozempic  on Sundays  Patient denies hypoglycemic s/sx including dizziness, shakiness, sweating. Patient denies hyperglycemic symptoms including polyuria, polydipsia, polyphagia, nocturia, neuropathy, blurred vision.   Current physical activity: limited due to pain  Current medication access support: Ozempic  through [facility], Farxiga through [facility]   Objective:  Lab Results  Component Value Date   HGBA1C 7.6 (H) 03/17/2023    Lab Results  Component Value Date   CREATININE 1.13 03/17/2023   BUN 22 03/17/2023   NA 142 03/17/2023   K 4.3 03/17/2023   CL 100  03/17/2023   CO2 32 03/17/2023    Lab Results  Component Value Date   CHOL 186 03/17/2023   HDL 56.20 03/17/2023   LDLCALC 67 03/17/2023   LDLDIRECT 87.0 07/12/2021   TRIG 315.0 (H) 03/17/2023   CHOLHDL 3 03/17/2023    Medications Reviewed Today     Reviewed by Lionell Jon DEL, RPH (Pharmacist) on 07/22/23 at 0950  Med List Status: <None>   Medication Order Taking? Sig Documenting Provider Last Dose Status Informant  aspirin 81 MG EC tablet [id number] Yes Take 81 mg by mouth daily. [provider]  Active   atorvastatin  (LIPITOR) 10 MG tablet [id number] Yes Take 1 tablet (10 mg total) by mouth daily. Johnny Garnette LABOR, MD  Active   cholecalciferol (VITAMIN D) 1000 UNITS tablet [id number] Yes Take 1,000 Units by mouth daily. [provider]  Active   Cinnamon 500 MG TABS [id number] Yes Take 2 tablets by mouth daily. [provider]  Active   Continuous Glucose Receiver (FREESTYLE LIBRE 3 READER) DEVI [id number]  Use to monitor blood sugar continuously with freestyle 3 plus sensors Dx E11.69 Johnny Garnette LABOR, MD  Active   Continuous Glucose Sensor (FREESTYLE LIBRE 3 PLUS SENSOR) MISC [id number] Yes Use to monitor blood sugar continuously. Change sensor every 15 days. Dx: E11.69 Johnny Garnette LABOR, MD  Active   cyclobenzaprine  (FLEXERIL ) 10 MG tablet [id number] Yes TAKE ONE TABLET BY MOUTH THREE TIMES DAILY AS NEEDED FOR MUSCLE SPASMS Johnny Garnette LABOR, MD  Active   dapagliflozin propanediol (FARXIGA) 5 MG TABS tablet [id number] Yes Take 5 mg by mouth daily. Via  [facility] PAP [provider]  Active   diltiazem  (CARDIZEM  CD) 360 MG 24 hr capsule [id number] Yes Take 1 capsule (360 mg total) by mouth daily. Johnny Garnette LABOR, MD  Active   diphenhydrAMINE (BENADRYL) 25 mg capsule [id number] Yes Take 25 mg by mouth as needed. [provider]  Active   EPINEPHrine  0.3 mg/0.3 mL IJ SOAJ injection [id number]  Inject 0.3 mg into the muscle as needed for anaphylaxis. Johnny Garnette LABOR,  MD  Active   fenofibrate  160 MG tablet [id number] Yes Take 1 tablet (160 mg total) by mouth daily. Johnny Garnette LABOR, MD  Active   gabapentin  (NEURONTIN ) 300 MG capsule [id number] Yes TAKE TWO CAPSULES BY MOUTH THREE TIMES DAILY Johnny Garnette LABOR, MD  Active   levothyroxine  (SYNTHROID ) 50 MCG tablet [id number] Yes TAKE 1 TABLET EVERY MORNING Fry, Stephen A, MD  Active   Melatonin 10 MG CAPS [id number] Yes Take 10 mg by mouth at bedtime. [provider]  Active   metFORMIN  (GLUCOPHAGE -XR) 500 MG 24 hr tablet [id number] Yes Take 1 tablet (500 mg total) by mouth daily with breakfast. Johnny Garnette LABOR, MD  Active   metoprolol  succinate (TOPROL -XL) 100 MG 24 hr tablet [id number] Yes TAKE ONE TABLET BY MOUTH ONCE DAILY WITH A MEAL Johnny Garnette LABOR, MD  Active   omeprazole  (PRILOSEC) 20 MG capsule [id number] Yes TAKE 1 CAPSULE (20 MG TOTAL) BY MOUTH 2 (TWO) TIMES DAILY. Johnny Garnette LABOR, MD  Active   OZEMPIC , 2 MG/DOSE, 8 MG/3ML NELMA [id number] Yes INJECT 2MG  INTO SKIN WEEKLY Johnny Garnette LABOR, MD  Active            Med Note JANEAN JON VEAR Stevan Apr 08, 2023  9:36 AM) sundays    Discontinued 09/22/11 0946 (Reorder)   potassium chloride  (KLOR-CON  M) 10 MEQ tablet [id number] Yes TAKE 1 TABLET EVERY DAY Johnny Garnette LABOR, MD  Active   promethazine  (PHENERGAN ) 25 MG tablet [id number] Yes Take 1 tablet (25 mg total) by mouth every 6 (six) hours as needed for nausea. Johnny Garnette LABOR, MD  Active   temazepam  (RESTORIL ) 30 MG capsule [id number] Yes Take 1 capsule (30 mg total) by mouth at bedtime. Johnny Garnette LABOR, MD  Active   traMADol  (ULTRAM ) 50 MG tablet [id number] Yes TAKE TWO TABLETS BY MOUTH THREE TIMES DAILY AS NEEDED Johnny Garnette LABOR, MD  Active   venlafaxine  XR (EFFEXOR -XR) 150 MG 24 hr capsule [id number] Yes Take 1 capsule (150 mg total) by mouth every morning. Johnny Garnette LABOR, MD  Active   vitamin B-12 (CYANOCOBALAMIN ) 1000 MCG tablet [id number] Yes Take 1,000 mcg by mouth daily. 2500 mcg once daily [provider]   Active Self  vitamin E 180 MG (400 UNITS) capsule [id number] Yes Take 400 Units by mouth daily. [provider]  Active Self              Assessment/Plan:   Diabetes: - Currently uncontrolled but improving - Reviewed long term cardiovascular and renal outcomes of uncontrolled blood sugar - Reviewed goal A1c, goal fasting, and goal 2 hour post prandial glucose -GFR does not allow for metformin  dose increase -Recommend low-carb diet (less pancakes for breakfast) and more protein -Continue current medication therapy, due for f/u with PCP in August, declines to schedule at this time   Follow Up Plan: 6 weeks  JON VEAR Lindau, PharmD Clinical Pharmacist [phone]

## 2023-07-24 DIAGNOSIS — M65332 Trigger finger, left middle finger: Secondary | ICD-10-CM | POA: Diagnosis not present

## 2023-07-30 ENCOUNTER — Other Ambulatory Visit: Payer: Self-pay

## 2023-07-30 ENCOUNTER — Encounter: Payer: Self-pay | Admitting: Family Medicine

## 2023-07-30 MED ORDER — DILTIAZEM HCL ER COATED BEADS 360 MG PO CP24: 360.0000 mg | ORAL_CAPSULE | Freq: Every day | ORAL | 3 refills | Status: DC

## 2023-08-18 ENCOUNTER — Encounter: Payer: Self-pay | Admitting: Family Medicine

## 2023-08-19 ENCOUNTER — Other Ambulatory Visit: Payer: Self-pay

## 2023-08-19 DIAGNOSIS — E785 Hyperlipidemia, unspecified: Secondary | ICD-10-CM

## 2023-08-19 MED ORDER — FENOFIBRATE 160 MG PO TABS: 160.0000 mg | ORAL_TABLET | Freq: Every day | ORAL | 1 refills | Status: DC

## 2023-09-02 ENCOUNTER — Telehealth: Payer: Self-pay

## 2023-09-02 NOTE — Progress Notes (Signed)
   09/02/2023  Patient ID: Christina [name], female   DOB: [birth year], 58 y.o.   MRN: [id number]  Received request from [facility] for new rx for Ozempic  2mg  refill.  Faxed in 4 month supply order, fax confirmation received.  Jon VEAR Lindau, PharmD Clinical Pharmacist [phone]

## 2023-09-09 ENCOUNTER — Other Ambulatory Visit

## 2023-09-09 DIAGNOSIS — E1169 Type 2 diabetes mellitus with other specified complication: Secondary | ICD-10-CM

## 2023-09-09 NOTE — Progress Notes (Signed)
 09/09/2023 Name: Christina [name] MRN: [id number] DOB: 09-Jan-1966  Chief Complaint  Patient presents with   Medication Management   Diabetes    Christina [name] is a 58 y.o. year old female who presented for a telephone visit.   They were referred to the pharmacist by their PCP for assistance in managing diabetes.    Subjective:  Care Team: Primary Care Provider: Johnny Garnette LABOR, MD   Medication Access/Adherence  Current Pharmacy:  [facility] [address] [place] - [address] [facility] [place] [address] Phone: [phone] Fax: [phone]   Patient reports affordability concerns with their medications: No  Patient reports access/transportation concerns to their pharmacy: No  Patient reports adherence concerns with their medications:  No     Diabetes:  Current medications: Ozempic  2mg , Metformin  XR 500mg , Farxiga 5mg  Medications tried in the past: Glipizide , Januvia   Current glucose readings: 112 while on the phone this morning fasting, reports her numbers have improved over the last month and mostly seeing under 130 fasting. Unable to pull up her report to provide sugar averages, does not use app for remote connection  Reports that her daughter has now been placed in a nursing home, which has given patient more time to focus on healthier eating and taking care of her health  Gives Ozempic  on Sundays  Patient denies hypoglycemic s/sx including dizziness, shakiness, sweating. Patient denies hyperglycemic symptoms including polyuria, polydipsia, polyphagia, nocturia, neuropathy, blurred vision.   Current physical activity: limited due to pain  Current medication access support: Ozempic  through Novo, Farxiga through [employer]Me   Objective:  Lab Results  Component Value Date   HGBA1C 7.6 (H) 03/17/2023    Lab Results  Component Value Date   CREATININE 1.13 03/17/2023   BUN 22 03/17/2023   NA 142 03/17/2023   K 4.3 03/17/2023   CL 100  03/17/2023   CO2 32 03/17/2023    Lab Results  Component Value Date   CHOL 186 03/17/2023   HDL 56.20 03/17/2023   LDLCALC 67 03/17/2023   LDLDIRECT 87.0 07/12/2021   TRIG 315.0 (H) 03/17/2023   CHOLHDL 3 03/17/2023    Medications Reviewed Today     Reviewed by Lionell Jon DEL, RPH (Pharmacist) on 09/09/23 at 0941  Med List Status: <None>   Medication Order Taking? Sig Documenting Provider Last Dose Status Informant  aspirin 81 MG EC tablet [id number] Yes Take 81 mg by mouth daily. [provider]  Active   atorvastatin  (LIPITOR) 10 MG tablet [id number] Yes Take 1 tablet (10 mg total) by mouth daily. Johnny Garnette LABOR, MD  Active   cholecalciferol (VITAMIN D) 1000 UNITS tablet [id number] Yes Take 1,000 Units by mouth daily. [provider]  Active   Cinnamon 500 MG TABS [id number] Yes Take 2 tablets by mouth daily. [provider]  Active   Continuous Glucose Receiver (FREESTYLE LIBRE 3 READER) DEVI [id number] Yes Use to monitor blood sugar continuously with freestyle 3 plus sensors Dx E11.69 Johnny Garnette LABOR, MD  Active   Continuous Glucose Sensor (FREESTYLE LIBRE 3 PLUS SENSOR) MISC [id number] Yes Use to monitor blood sugar continuously. Change sensor every 15 days. Dx: E11.69 Johnny Garnette LABOR, MD  Active   cyclobenzaprine  (FLEXERIL ) 10 MG tablet [id number] Yes TAKE ONE TABLET BY MOUTH THREE TIMES DAILY AS NEEDED FOR MUSCLE SPASMS Johnny Garnette LABOR, MD  Active   dapagliflozin propanediol (FARXIGA) 5 MG TABS tablet [id number] Yes Take 5 mg by mouth daily. Via  AZ&Me PAP [provider]  Active   diltiazem  (CARDIZEM  CD) 360 MG 24 hr capsule [id number] Yes Take 1 capsule (360 mg total) by mouth daily. Johnny Garnette LABOR, MD  Active   diphenhydrAMINE (BENADRYL) 25 mg capsule [id number] Yes Take 25 mg by mouth as needed. [provider]  Active   EPINEPHrine  0.3 mg/0.3 mL IJ SOAJ injection [id number] Yes Inject 0.3 mg into the muscle as needed for anaphylaxis. Johnny Garnette LABOR, MD  Active   fenofibrate  160 MG tablet [id number] Yes Take 1 tablet (160 mg total) by mouth daily. Johnny Garnette LABOR, MD  Active   gabapentin  (NEURONTIN ) 300 MG capsule [id number] Yes TAKE TWO CAPSULES BY MOUTH THREE TIMES DAILY Johnny Garnette LABOR, MD  Active   levothyroxine  (SYNTHROID ) 50 MCG tablet [id number] Yes TAKE 1 TABLET EVERY MORNING Fry, Stephen A, MD  Active   Melatonin 10 MG CAPS [id number] Yes Take 10 mg by mouth at bedtime. [provider]  Active   metFORMIN  (GLUCOPHAGE -XR) 500 MG 24 hr tablet [id number] Yes Take 1 tablet (500 mg total) by mouth daily with breakfast. Johnny Garnette LABOR, MD  Active   metoprolol  succinate (TOPROL -XL) 100 MG 24 hr tablet [id number] Yes TAKE ONE TABLET BY MOUTH ONCE DAILY WITH A MEAL Johnny Garnette LABOR, MD  Active   omeprazole  (PRILOSEC) 20 MG capsule [id number] Yes TAKE 1 CAPSULE (20 MG TOTAL) BY MOUTH 2 (TWO) TIMES DAILY. Johnny Garnette LABOR, MD  Active   OZEMPIC , 2 MG/DOSE, 8 MG/3ML NELMA [id number] Yes INJECT 2MG  INTO SKIN WEEKLY Johnny Garnette LABOR, MD  Active            Med Note JANEAN JON VEAR Stevan Apr 08, 2023  9:36 AM) sundays    Discontinued 09/22/11 0946 (Reorder)   potassium chloride  (KLOR-CON  M) 10 MEQ tablet [id number] Yes TAKE 1 TABLET EVERY DAY Johnny Garnette LABOR, MD  Active   promethazine  (PHENERGAN ) 25 MG tablet [id number] Yes Take 1 tablet (25 mg total) by mouth every 6 (six) hours as needed for nausea. Johnny Garnette LABOR, MD  Active   temazepam  (RESTORIL ) 30 MG capsule [id number] Yes Take 1 capsule (30 mg total) by mouth at bedtime. Johnny Garnette LABOR, MD  Active   traMADol  (ULTRAM ) 50 MG tablet [id number] Yes TAKE TWO TABLETS BY MOUTH THREE TIMES DAILY AS NEEDED Johnny Garnette LABOR, MD  Active   venlafaxine  XR (EFFEXOR -XR) 150 MG 24 hr capsule [id number] Yes Take 1 capsule (150 mg total) by mouth every morning. Johnny Garnette LABOR, MD  Active   vitamin B-12 (CYANOCOBALAMIN ) 1000 MCG tablet [id number] Yes Take 1,000 mcg by mouth daily. 2500 mcg once daily [provider]  Active Self  vitamin E 180 MG (400 UNITS) capsule [id number] Yes Take 400 Units by mouth daily. [provider]  Active Self              Assessment/Plan:   Diabetes: - Currently uncontrolled but improving - Reviewed long term cardiovascular and renal outcomes of uncontrolled blood sugar - Reviewed goal A1c, goal fasting, and goal 2 hour post prandial glucose -GFR does not allow for metformin  dose increase -Recommend low-carb diet (less pancakes for breakfast) and more protein -Continue current medication therapy, due for f/u with PCP in August, declines to schedule at this time, reports she will call to schedule Sept appt next week   Follow Up Plan: 2 montsh  JON VEAR Lindau, PharmD Clinical Pharmacist [phone]

## 2023-09-18 ENCOUNTER — Encounter: Payer: Self-pay | Admitting: Family Medicine

## 2023-09-18 ENCOUNTER — Ambulatory Visit (INDEPENDENT_AMBULATORY_CARE_PROVIDER_SITE_OTHER): Admitting: Family Medicine

## 2023-09-18 VITALS — BP 140/80 | HR 87 | Temp 97.8°F | Wt 201.0 lb

## 2023-09-18 DIAGNOSIS — G8929 Other chronic pain: Secondary | ICD-10-CM

## 2023-09-18 DIAGNOSIS — M5442 Lumbago with sciatica, left side: Secondary | ICD-10-CM | POA: Diagnosis not present

## 2023-09-18 DIAGNOSIS — Z7984 Long term (current) use of oral hypoglycemic drugs: Secondary | ICD-10-CM | POA: Diagnosis not present

## 2023-09-18 DIAGNOSIS — K219 Gastro-esophageal reflux disease without esophagitis: Secondary | ICD-10-CM

## 2023-09-18 DIAGNOSIS — E1169 Type 2 diabetes mellitus with other specified complication: Secondary | ICD-10-CM | POA: Diagnosis not present

## 2023-09-18 DIAGNOSIS — Z7985 Long-term (current) use of injectable non-insulin antidiabetic drugs: Secondary | ICD-10-CM

## 2023-09-18 DIAGNOSIS — M5441 Lumbago with sciatica, right side: Secondary | ICD-10-CM | POA: Diagnosis not present

## 2023-09-18 DIAGNOSIS — I10 Essential (primary) hypertension: Secondary | ICD-10-CM | POA: Diagnosis not present

## 2023-09-18 LAB — POCT GLYCOSYLATED HEMOGLOBIN (HGB A1C): Hemoglobin A1C: 6.9 % — AB (ref 4.0–5.6)

## 2023-09-18 MED ORDER — DILTIAZEM HCL ER COATED BEADS 360 MG PO CP24: 360.0000 mg | ORAL_CAPSULE | Freq: Every day | ORAL | 3 refills | Status: DC

## 2023-09-18 MED ORDER — FREESTYLE LIBRE 3 PLUS SENSOR MISC: 11 refills | Status: AC

## 2023-09-18 MED ORDER — GABAPENTIN 300 MG PO CAPS: ORAL_CAPSULE | ORAL | 3 refills | Status: DC

## 2023-09-18 MED ORDER — FREESTYLE LIBRE 3 READER DEVI: 2 refills | Status: AC

## 2023-09-18 MED ORDER — OMEPRAZOLE 20 MG PO CPDR: 20.0000 mg | DELAYED_RELEASE_CAPSULE | Freq: Two times a day (BID) | ORAL | 3 refills | Status: DC

## 2023-09-18 MED ORDER — CYCLOBENZAPRINE HCL 10 MG PO TABS: ORAL_TABLET | ORAL | 1 refills | Status: DC

## 2023-09-18 MED ORDER — LEVOTHYROXINE SODIUM 50 MCG PO TABS: 50.0000 ug | ORAL_TABLET | Freq: Every morning | ORAL | 3 refills | Status: DC

## 2023-09-18 MED ORDER — METFORMIN HCL ER 500 MG PO TB24: 500.0000 mg | ORAL_TABLET | Freq: Every day | ORAL | 3 refills | Status: DC

## 2023-09-18 MED ORDER — TEMAZEPAM 30 MG PO CAPS: 30.0000 mg | ORAL_CAPSULE | Freq: Every day | ORAL | 5 refills | Status: DC

## 2023-09-18 MED ORDER — TRAMADOL HCL 50 MG PO TABS: ORAL_TABLET | ORAL | 2 refills | Status: AC

## 2023-09-18 NOTE — Addendum Note (Signed)
 Addended by: LADONNA INOCENTE SAILOR on: 09/18/2023 02:07 PM   Modules accepted: Orders

## 2023-09-18 NOTE — Progress Notes (Signed)
   Subjective:    Patient ID: Christina [name], female    DOB: 11/17/1965, 58 y.o.   MRN: [id number]  HPI Here to follow up on issues. She feels well in general. Her BP has been stable. Her A1c today is 6.9%. Her GERD is stable.    Review of Systems  Constitutional: Negative.   Respiratory: Negative.    Cardiovascular: Negative.   Gastrointestinal: Negative.   Genitourinary: Negative.        Objective:   Physical Exam Constitutional:      Appearance: She is not ill-appearing.     Comments: In her wheelchair   Cardiovascular:     Rate and Rhythm: Normal rate and regular rhythm.     Pulses: Normal pulses.     Heart sounds: Normal heart sounds.  Pulmonary:     Effort: Pulmonary effort is normal.     Breath sounds: Normal breath sounds.  Neurological:     Mental Status: She is alert.           Assessment & Plan:  Her HTN and type 2 diabetes and GERD are all well controlled. Medications were refilled.  Garnette Olmsted, MD

## 2023-10-04 ENCOUNTER — Encounter: Payer: Self-pay | Admitting: Family Medicine

## 2023-10-05 ENCOUNTER — Other Ambulatory Visit: Payer: Self-pay

## 2023-10-05 MED ORDER — DILTIAZEM HCL ER COATED BEADS 360 MG PO CP24: 360.0000 mg | ORAL_CAPSULE | Freq: Every day | ORAL | 3 refills | Status: DC

## 2023-10-05 NOTE — Telephone Encounter (Signed)
 Pt Rx sent to pt pharmacy per pt

## 2023-10-19 ENCOUNTER — Other Ambulatory Visit (HOSPITAL_COMMUNITY): Payer: Self-pay

## 2023-10-20 ENCOUNTER — Other Ambulatory Visit (HOSPITAL_COMMUNITY): Payer: Self-pay

## 2023-10-20 ENCOUNTER — Telehealth: Payer: Self-pay

## 2023-10-20 NOTE — Telephone Encounter (Signed)
 Pharmacy Patient Advocate Encounter   Received notification from Pt Calls Messages that prior authorization for Temazepam  30 caps is required/requested.   Insurance verification completed.   The patient is insured through [employer] .   Per test claim: PA required; PA submitted to above mentioned insurance via Latent Key/confirmation #/EOC [id number] Status is pending

## 2023-10-21 ENCOUNTER — Other Ambulatory Visit (HOSPITAL_COMMUNITY): Payer: Self-pay

## 2023-10-21 NOTE — Telephone Encounter (Signed)
 Pharmacy Patient Advocate Encounter  Received notification from [employer] that Prior Authorization for Temazepam  30 caps has been APPROVED from 01/28/23 to 01/27/24. Ran test claim, Copay is $3.45. This test claim was processed through [facility]- copay amounts may vary at other pharmacies due to pharmacy/plan contracts, or as the patient moves through the different stages of their insurance plan.   PA #/Case ID/Reference #: [id number]

## 2023-10-28 ENCOUNTER — Telehealth: Payer: Self-pay

## 2023-10-28 NOTE — Telephone Encounter (Signed)
 Pt advised that pt assistant medication is ready for pickup. Pt stated that [place] advised of info but [place] so she will get it as soon as she can.

## 2023-11-10 ENCOUNTER — Telehealth: Payer: Self-pay

## 2023-11-10 NOTE — Progress Notes (Signed)
   11/10/2023  Patient ID: Christina [name], female   DOB: 05-Oct-1965, 58 y.o.   MRN: [id number]  Received fax notification from [employer] that patient has been conditionally approved to receive Farxiga for 2026. In order to get full approval for 2026 and continue to receive product, patient needs to provide consent that they acknowledge the terms and conditions of the program.   This can be done in the following ways: 1) Patient can go to [email or web address] to provide consent via digital assistant 2) Call [phone] to provide verbal consent 3) Complete the paper application   [name]: [id number]   Forwarding to CPhT to assist with enrollment process for patient.   Christina [name], PharmD Clinical Pharmacist [phone]

## 2023-11-10 NOTE — Telephone Encounter (Signed)
 Gave pt a call pt is pre-approved on AZ[employer]ME Farxiga pt can call [employer] and give consent, left a HIPAA VM.

## 2023-11-11 ENCOUNTER — Other Ambulatory Visit (INDEPENDENT_AMBULATORY_CARE_PROVIDER_SITE_OTHER)

## 2023-11-11 DIAGNOSIS — E1169 Type 2 diabetes mellitus with other specified complication: Secondary | ICD-10-CM

## 2023-11-11 DIAGNOSIS — I10 Essential (primary) hypertension: Secondary | ICD-10-CM

## 2023-11-11 NOTE — Progress Notes (Signed)
 11/11/2023 Name: Christina [name] MRN: [id number] DOB: 04/06/1965  Chief Complaint  Patient presents with   Medication Management   Diabetes   Hypertension    Christina [name] is a 58 y.o. year old female who presented for a telephone visit.   They were referred to the pharmacist by their PCP for assistance in managing diabetes.    Subjective:  Care Team: Primary Care Provider: Johnny Garnette LABOR, MD   Medication Access/Adherence  Current Pharmacy:  [facility] [address] [place] - [address] [place]  [address] Phone: [phone] Fax: [phone]   Patient reports affordability concerns with their medications: No  Patient reports access/transportation concerns to their pharmacy: No  Patient reports adherence concerns with their medications:  No     Diabetes:  Current medications: Ozempic  2mg , Metformin  XR 500mg , Farxiga 5mg  Medications tried in the past: Glipizide , Januvia   Current glucose readings: Reporting mostly controlled readings at home Unable to pull up her report to provide sugar averages, does not use app for remote connection  Reports that her daughter has now been placed in a nursing home, which has given patient more time to focus on healthier eating and taking care of her health  Gives Ozempic  on Sundays, is aware that PAP meds are available for pick up at office. Is aware program is ending for Medicare patients and will have to use insurance to stay on med for 2026.  Patient denies hypoglycemic s/sx including dizziness, shakiness, sweating. Patient denies hyperglycemic symptoms including polyuria, polydipsia, polyphagia, nocturia, neuropathy, blurred vision.   Current physical activity: limited due to pain  Current medication access support: Ozempic  through Novo, Farxiga through AZ&Me  Patient asks if she is due for any other routine monitoring or vaccines, reviewed care gaps in vaccinations, uACR, eye exam and  mammogram  Reviewed that farxiga renewal is due, patient needs to provide consent, she requests a mychart message on phone number to provide verbal consent   Objective:  Lab Results  Component Value Date   HGBA1C 6.9 (A) 09/18/2023    Lab Results  Component Value Date   CREATININE 1.13 03/17/2023   BUN 22 03/17/2023   NA 142 03/17/2023   K 4.3 03/17/2023   CL 100 03/17/2023   CO2 32 03/17/2023    Lab Results  Component Value Date   CHOL 186 03/17/2023   HDL 56.20 03/17/2023   LDLCALC 67 03/17/2023   LDLDIRECT 87.0 07/12/2021   TRIG 315.0 (H) 03/17/2023   CHOLHDL 3 03/17/2023    Medications Reviewed Today     Reviewed by Lionell Jon DEL, RPH (Pharmacist) on 11/11/23 at 1047  Med List Status: <None>   Medication Order Taking? Sig Documenting Provider Last Dose Status Informant  aspirin 81 MG EC tablet [id number]  Take 81 mg by mouth daily. [provider]  Active   atorvastatin  (LIPITOR) 10 MG tablet [id number]  Take 1 tablet (10 mg total) by mouth daily. Johnny Garnette LABOR, MD  Active   cholecalciferol (VITAMIN D) 1000 UNITS tablet [id number]  Take 1,000 Units by mouth daily. [provider]  Active   Cinnamon 500 MG TABS [id number]  Take 2 tablets by mouth daily. [provider]  Active   Continuous Glucose Receiver (FREESTYLE LIBRE 3 READER) DEVI [id number]  Use to monitor blood sugar continuously with freestyle 3 plus sensors Dx E11.69 Johnny Garnette LABOR, MD  Active   Continuous Glucose Sensor (FREESTYLE LIBRE 3 PLUS SENSOR) MISC  [id number]  Use to monitor blood sugar continuously. Change sensor every 15 days. Dx: E11.69 Johnny Garnette LABOR, MD  Active   cyclobenzaprine  (FLEXERIL ) 10 MG tablet [id number]  TAKE ONE TABLET BY MOUTH three times daily AS NEEDED FOR MUSCLE SPASMS Johnny Garnette LABOR, MD  Active   dapagliflozin propanediol (FARXIGA) 5 MG TABS tablet [id number]  Take 5 mg by mouth daily. Via AZ&Me PAP [provider]  Active   diltiazem  (CARDIZEM   CD) 360 MG 24 hr capsule [id number]  Take 1 capsule (360 mg total) by mouth daily. Johnny Garnette LABOR, MD  Active   diphenhydrAMINE (BENADRYL) 25 mg capsule [id number]  Take 25 mg by mouth as needed. [provider]  Active   EPINEPHrine  0.3 mg/0.3 mL IJ SOAJ injection [id number]  Inject 0.3 mg into the muscle as needed for anaphylaxis. Johnny Garnette LABOR, MD  Active   fenofibrate  160 MG tablet [id number]  Take 1 tablet (160 mg total) by mouth daily. Johnny Garnette LABOR, MD  Active   gabapentin  (NEURONTIN ) 300 MG capsule [id number]  TAKE TWO CAPSULES BY MOUTH THREE TIMES DAILY Johnny Garnette LABOR, MD  Active   levothyroxine  (SYNTHROID ) 50 MCG tablet [id number]  Take 1 tablet (50 mcg total) by mouth every morning. Johnny Garnette LABOR, MD  Active   Melatonin 10 MG CAPS [id number]  Take 10 mg by mouth at bedtime. [provider]  Active   metFORMIN  (GLUCOPHAGE -XR) 500 MG 24 hr tablet [id number]  Take 1 tablet (500 mg total) by mouth daily with breakfast. Johnny Garnette LABOR, MD  Active   metoprolol  succinate (TOPROL -XL) 100 MG 24 hr tablet [id number]  TAKE ONE TABLET BY MOUTH ONCE DAILY WITH A MEAL Johnny Garnette LABOR, MD  Active   omeprazole  (PRILOSEC) 20 MG capsule [id number]  Take 1 capsule (20 mg total) by mouth 2 (two) times daily. Johnny Garnette LABOR, MD  Active   OZEMPIC , 2 MG/DOSE, 8 MG/3ML SOPN [id number]  [facility] 2MG  INTO SKIN WEEKLY Johnny Garnette LABOR, MD  Active            Med Note JANEAN JON VEAR Stevan Apr 08, 2023  9:36 AM) sundays  Discontinued 09/22/11 0946 (Reorder)   potassium chloride  (KLOR-CON  M) 10 MEQ tablet [id number]  TAKE 1 TABLET EVERY DAY Johnny Garnette LABOR, MD  Active   promethazine  (PHENERGAN ) 25 MG tablet [id number]  Take 1 tablet (25 mg total) by mouth every 6 (six) hours as needed for nausea. Johnny Garnette LABOR, MD  Active   temazepam  (RESTORIL ) 30 MG capsule [id number]  Take 1 capsule (30 mg total) by mouth at bedtime. Johnny Garnette LABOR, MD  Active   traMADol  (ULTRAM ) 50 MG tablet [id number]  TAKE TWO TABLETS BY MOUTH  THREE TIMES DAILY AS NEEDED Johnny Garnette LABOR, MD  Active   venlafaxine  XR (EFFEXOR -XR) 150 MG 24 hr capsule [id number]  Take 1 capsule (150 mg total) by mouth every morning. Johnny Garnette LABOR, MD  Active   vitamin B-12 (CYANOCOBALAMIN ) 1000 MCG tablet [id number]  Take 1,000 mcg by mouth daily. 2500 mcg once daily [provider]  Active Self  vitamin E 180 MG (400 UNITS) capsule [id number]  Take 400 Units by mouth daily. [provider]  Active Self              Assessment/Plan:   Diabetes: - Currently uncontrolled but improving - Reviewed long term cardiovascular and renal outcomes of uncontrolled blood sugar - Reviewed goal A1c, goal fasting, and goal 2 hour  post prandial glucose -GFR does not allow for metformin  dose increase at this time, due for updated CMP -Recommend low-carb diet (less pancakes for breakfast) and more protein -Continue current medication therapy -Patient requests appt with PCP, sch for 10/28, made note of need for uACR and CMP   Follow Up Plan: 1 month  Jon VEAR Lindau, PharmD Clinical Pharmacist [phone]

## 2023-11-23 ENCOUNTER — Telehealth: Payer: Self-pay

## 2023-11-23 NOTE — Progress Notes (Signed)
   11/23/2023  Patient ID: Christina [name], female   DOB: [birth year], 58 y.o.   MRN: [id number]  Received notification from AZ[employer]Me that patient has been approved to receive Farxiga through 01/26/2025.  Christina [name], PharmD Clinical Pharmacist [phone]

## 2023-11-24 ENCOUNTER — Ambulatory Visit (INDEPENDENT_AMBULATORY_CARE_PROVIDER_SITE_OTHER): Admitting: Family Medicine

## 2023-11-24 ENCOUNTER — Encounter: Payer: Self-pay | Admitting: Family Medicine

## 2023-11-24 VITALS — BP 142/80 | HR 80 | Temp 98.6°F | Wt 198.0 lb

## 2023-11-24 DIAGNOSIS — E119 Type 2 diabetes mellitus without complications: Secondary | ICD-10-CM | POA: Diagnosis not present

## 2023-11-24 DIAGNOSIS — I10 Essential (primary) hypertension: Secondary | ICD-10-CM

## 2023-11-24 DIAGNOSIS — Z23 Encounter for immunization: Secondary | ICD-10-CM | POA: Diagnosis not present

## 2023-11-24 DIAGNOSIS — E1169 Type 2 diabetes mellitus with other specified complication: Secondary | ICD-10-CM

## 2023-11-24 DIAGNOSIS — E038 Other specified hypothyroidism: Secondary | ICD-10-CM | POA: Diagnosis not present

## 2023-11-24 DIAGNOSIS — E785 Hyperlipidemia, unspecified: Secondary | ICD-10-CM | POA: Diagnosis not present

## 2023-11-24 LAB — CBC WITH DIFFERENTIAL/PLATELET
Basophils Absolute: 0 K/uL (ref 0.0–0.1)
Basophils Relative: 0.3 % (ref 0.0–3.0)
Eosinophils Absolute: 0.1 K/uL (ref 0.0–0.7)
Eosinophils Relative: 0.8 % (ref 0.0–5.0)
HCT: 43.3 % (ref 36.0–46.0)
Hemoglobin: 14.5 g/dL (ref 12.0–15.0)
Lymphocytes Relative: 25 % (ref 12.0–46.0)
Lymphs Abs: 1.8 K/uL (ref 0.7–4.0)
MCHC: 33.6 g/dL (ref 30.0–36.0)
MCV: 86 fl (ref 78.0–100.0)
Monocytes Absolute: 0.4 K/uL (ref 0.1–1.0)
Monocytes Relative: 4.9 % (ref 3.0–12.0)
Neutro Abs: 5.1 K/uL (ref 1.4–7.7)
Neutrophils Relative %: 69 % (ref 43.0–77.0)
Platelets: 210 K/uL (ref 150.0–400.0)
RBC: 5.03 Mil/uL (ref 3.87–5.11)
RDW: 14.4 % (ref 11.5–15.5)
WBC: 7.4 K/uL (ref 4.0–10.5)

## 2023-11-24 LAB — MICROALBUMIN / CREATININE URINE RATIO
Creatinine,U: 117.1 mg/dL
Microalb Creat Ratio: 62.5 mg/g — ABNORMAL HIGH (ref 0.0–30.0)
Microalb, Ur: 7.3 mg/dL — ABNORMAL HIGH (ref 0.0–1.9)

## 2023-11-24 LAB — TSH: TSH: 1.48 u[IU]/mL (ref 0.35–5.50)

## 2023-11-24 LAB — LIPID PANEL
Cholesterol: 198 mg/dL (ref 0–200)
HDL: 56.5 mg/dL (ref >39.00)
LDL Cholesterol: 86 mg/dL (ref 0–99)
NonHDL: 141.4
Total CHOL/HDL Ratio: 4
Triglycerides: 277 mg/dL — ABNORMAL HIGH (ref 0.0–149.0)
VLDL: 55.4 mg/dL — ABNORMAL HIGH (ref 0.0–40.0)

## 2023-11-24 LAB — HEPATIC FUNCTION PANEL
ALT: 18 U/L (ref 0–35)
AST: 15 U/L (ref 0–37)
Albumin: 4.5 g/dL (ref 3.5–5.2)
Alkaline Phosphatase: 50 U/L (ref 39–117)
Bilirubin, Direct: 0.1 mg/dL (ref 0.0–0.3)
Total Bilirubin: 0.4 mg/dL (ref 0.2–1.2)
Total Protein: 7.5 g/dL (ref 6.0–8.3)

## 2023-11-24 LAB — BASIC METABOLIC PANEL WITH GFR
BUN: 16 mg/dL (ref 6–23)
CO2: 30 meq/L (ref 19–32)
Calcium: 9.7 mg/dL (ref 8.4–10.5)
Chloride: 100 meq/L (ref 96–112)
Creatinine, Ser: 0.87 mg/dL (ref 0.40–1.20)
GFR: 73.02 mL/min (ref >60.00)
Glucose, Bld: 111 mg/dL — ABNORMAL HIGH (ref 70–99)
Potassium: 4 meq/L (ref 3.5–5.1)
Sodium: 141 meq/L (ref 135–145)

## 2023-11-24 LAB — HEMOGLOBIN A1C: Hgb A1c MFr Bld: 7.4 % — ABNORMAL HIGH (ref 4.6–6.5)

## 2023-11-24 LAB — T3, FREE: T3, Free: 2.7 pg/mL (ref 2.3–4.2)

## 2023-11-24 LAB — T4, FREE: Free T4: 0.94 ng/dL (ref 0.60–1.60)

## 2023-11-24 NOTE — Progress Notes (Signed)
   Subjective:    Patient ID: Christina [name], female    DOB: 1965/02/12, 57 y.o.   MRN: [id number]  HPI Here to follow up on diabetes, HTN, hyperlipidemia, and hypothyroidism. She feels well. Her BP has been stable at home.    Review of Systems  Constitutional: Negative.   Respiratory: Negative.    Cardiovascular: Negative.   Gastrointestinal: Negative.   Genitourinary: Negative.   Neurological: Negative.        Objective:   Physical Exam Constitutional:      Appearance: She is obese.     Comments: In a wheelchair   Cardiovascular:     Rate and Rhythm: Normal rate and regular rhythm.     Pulses: Normal pulses.     Heart sounds: Normal heart sounds.  Pulmonary:     Effort: Pulmonary effort is normal.     Breath sounds: Normal breath sounds.  Neurological:     Mental Status: She is alert.           Assessment & Plan:  Her HTN is well controlled. We will get labs to check her thyroid  levels, an A1c, lipids, etc.  She is given a Prevnar 20 and a flu shot today. Garnette Olmsted, MD

## 2023-11-24 NOTE — Addendum Note (Signed)
 Addended by: LADONNA INOCENTE SAILOR on: 11/24/2023 04:18 PM   Modules accepted: Orders

## 2023-11-26 ENCOUNTER — Ambulatory Visit: Payer: Self-pay | Admitting: Family Medicine

## 2023-12-09 ENCOUNTER — Other Ambulatory Visit

## 2023-12-09 DIAGNOSIS — E1169 Type 2 diabetes mellitus with other specified complication: Secondary | ICD-10-CM

## 2023-12-09 MED ORDER — DAPAGLIFLOZIN PROPANEDIOL 10 MG PO TABS: 10.0000 mg | ORAL_TABLET | Freq: Every day | ORAL | 3 refills | Status: DC

## 2023-12-09 NOTE — Progress Notes (Signed)
 12/09/2023 Name: Christina [name] MRN: [id number] DOB: 07-13-1965  Chief Complaint  Patient presents with   Medication Management   Diabetes   Hypertension    Christina [name] is a 58 y.o. year old female who presented for a telephone visit.   They were referred to the pharmacist by their PCP for assistance in managing diabetes.    Subjective:  Care Team: Primary Care Provider: Johnny Garnette LABOR, MD   Medication Access/Adherence  Current Pharmacy:  [facility] [address] [place] - [address] [place] [address] Phone: [phone] Fax: [phone]   Patient reports affordability concerns with their medications: No  Patient reports access/transportation concerns to their pharmacy: No  Patient reports adherence concerns with their medications:  No     Diabetes:  Current medications: Ozempic  2mg , Metformin  XR 500mg , Farxiga 5mg  Medications tried in the past: Glipizide , Januvia   Current glucose readings: Reporting mostly controlled readings at home Unable to pull up her report to provide sugar averages, does not use app for remote connection  Reports that her daughter has now been placed in a nursing home, which has given patient more time to focus on healthier eating and taking care of her health  Gives Ozempic  on Sundays. Is aware program is ending for Medicare patients and will have to use insurance to stay on med for 2026. Is unsure if insurance will stay the same, is meeting with her insurance person this month to discuss.  Patient denies hypoglycemic s/sx including dizziness, shakiness, sweating. Patient denies hyperglycemic symptoms including polyuria, polydipsia, polyphagia, nocturia, neuropathy, blurred vision.   Current physical activity: limited due to pain  Current medication access support: Ozempic  through Novo, Farxiga through AZ&Me  Hypertension:  Current medications: Metoprolol , Dilitiazem Medications previously  tried: hydrochlorothiazide , Spironolactone   Patient has a validated, automated, upper arm home BP cuff Current blood pressure readings readings: has not been checking  Patient denies hypotensive s/sx including dizziness, lightheadedness.  Patient denies hypertensive symptoms including headache, chest pain, shortness of breath     Objective:  Lab Results  Component Value Date   HGBA1C 7.4 (H) 11/24/2023    Lab Results  Component Value Date   CREATININE 0.87 11/24/2023   BUN 16 11/24/2023   NA 141 11/24/2023   K 4.0 11/24/2023   CL 100 11/24/2023   CO2 30 11/24/2023    Lab Results  Component Value Date   CHOL 198 11/24/2023   HDL 56.50 11/24/2023   LDLCALC 86 11/24/2023   LDLDIRECT 87.0 07/12/2021   TRIG 277.0 (H) 11/24/2023   CHOLHDL 4 11/24/2023    Medications Reviewed Today     Reviewed by Lionell Jon DEL, RPH (Pharmacist) on 12/09/23 at 0955  Med List Status: <None>   Medication Order Taking? Sig Documenting Provider Last Dose Status Informant  aspirin 81 MG EC tablet [id number]  Take 81 mg by mouth daily. [provider]  Active   atorvastatin  (LIPITOR) 10 MG tablet [id number]  Take 1 tablet (10 mg total) by mouth daily. Johnny Garnette LABOR, MD  Active   cholecalciferol (VITAMIN D) 1000 UNITS tablet [id number]  Take 1,000 Units by mouth daily. [provider]  Active   Cinnamon 500 MG TABS [id number]  Take 2 tablets by mouth daily. [provider]  Active   Continuous Glucose Receiver (FREESTYLE LIBRE 3 READER) DEVI [id number]  Use to monitor blood sugar continuously with freestyle 3 plus sensors Dx E11.69 Johnny Garnette LABOR, MD  Active   Continuous Glucose Sensor (FREESTYLE LIBRE 3 PLUS SENSOR) MISC [id number]  Use to monitor blood sugar continuously. Change sensor every 15 days. Dx: E11.69 Johnny Garnette LABOR, MD  Active   cyclobenzaprine  (FLEXERIL ) 10 MG tablet [id number]  TAKE ONE TABLET BY MOUTH three times daily AS NEEDED FOR MUSCLE SPASMS Johnny Garnette LABOR, MD  Active   dapagliflozin propanediol (FARXIGA) 5 MG TABS tablet [id number]  Take 5 mg by mouth daily. Via AZ&Me PAP [provider]  Active   diltiazem  (CARDIZEM  CD) 360 MG 24 hr capsule [id number]  Take 1 capsule (360 mg total) by mouth daily. Johnny Garnette LABOR, MD  Active   diphenhydrAMINE (BENADRYL) 25 mg capsule [id number]  Take 25 mg by mouth as needed. [provider]  Active   EPINEPHrine  0.3 mg/0.3 mL IJ SOAJ injection [id number]  Inject 0.3 mg into the muscle as needed for anaphylaxis. Johnny Garnette LABOR, MD  Active   fenofibrate  160 MG tablet [id number]  Take 1 tablet (160 mg total) by mouth daily. Johnny Garnette LABOR, MD  Active   gabapentin  (NEURONTIN ) 300 MG capsule [id number]  TAKE TWO CAPSULES BY MOUTH THREE TIMES DAILY Johnny Garnette LABOR, MD  Active   levothyroxine  (SYNTHROID ) 50 MCG tablet [id number]  Take 1 tablet (50 mcg total) by mouth every morning. Johnny Garnette LABOR, MD  Active   Melatonin 10 MG CAPS [id number]  Take 10 mg by mouth at bedtime. [provider]  Active   metFORMIN  (GLUCOPHAGE -XR) 500 MG 24 hr tablet [id number]  Take 1 tablet (500 mg total) by mouth daily with breakfast. Johnny Garnette LABOR, MD  Active   metoprolol  succinate (TOPROL -XL) 100 MG 24 hr tablet [id number]  TAKE ONE TABLET BY MOUTH ONCE DAILY WITH A MEAL Johnny Garnette LABOR, MD  Active   omeprazole  (PRILOSEC) 20 MG capsule [id number]  Take 1 capsule (20 mg total) by mouth 2 (two) times daily. Johnny Garnette LABOR, MD  Active   OZEMPIC , 2 MG/DOSE, 8 MG/3ML NELMA [id number]  INJECT 2MG  INTO SKIN WEEKLY Johnny Garnette LABOR, MD  Active            Med Note JANEAN JON VEAR Stevan Apr 08, 2023  9:36 AM) sundays  Discontinued 09/22/11 0946 (Reorder)   potassium chloride  (KLOR-CON  M) 10 MEQ tablet [id number]  TAKE 1 TABLET EVERY DAY Johnny Garnette LABOR, MD  Active   promethazine  (PHENERGAN ) 25 MG tablet [id number]  Take 1 tablet (25 mg total) by mouth every 6 (six) hours as needed for nausea. Johnny Garnette LABOR, MD  Active   temazepam   (RESTORIL ) 30 MG capsule [id number]  Take 1 capsule (30 mg total) by mouth at bedtime. Johnny Garnette LABOR, MD  Active   traMADol  (ULTRAM ) 50 MG tablet [id number]  TAKE TWO TABLETS BY MOUTH THREE TIMES DAILY AS NEEDED Johnny Garnette LABOR, MD  Active   venlafaxine  XR (EFFEXOR -XR) 150 MG 24 hr capsule [id number]  Take 1 capsule (150 mg total) by mouth every morning. Johnny Garnette LABOR, MD  Active   vitamin B-12 (CYANOCOBALAMIN ) 1000 MCG tablet [id number]  Take 1,000 mcg by mouth daily. 2500 mcg once daily [provider]  Active Self  vitamin E 180 MG (400 UNITS) capsule [id number]  Take 400 Units by mouth daily. [provider]  Active Self              Assessment/Plan:   Diabetes: - Currently uncontrolled - Reviewed long term cardiovascular and renal outcomes of uncontrolled blood sugar -  Reviewed goal A1c, goal fasting, and goal 2 hour post prandial glucose -Recommend low-carb diet (less pancakes for breakfast) and more protein -INCREASE Farxiga to 10mg  once daily, message sent to PCP to coordinate.  Hypertension: - Currently uncontrolled - Reviewed long term cardiovascular and renal outcomes of uncontrolled blood pressure - Reviewed appropriate blood pressure monitoring technique and reviewed goal blood pressure. Recommended to check home blood pressure and heart rate dailly - Recommend to continue current therapy, consider addition of chlorthalidone if BP remains above gaol         Follow Up Plan: 1 month  Jon VEAR Lindau, PharmD Clinical Pharmacist [phone]

## 2023-12-09 NOTE — Addendum Note (Signed)
 Addended by: LIONELL JON DEL on: 12/09/2023 04:08 PM   Modules accepted: Orders

## 2024-01-06 ENCOUNTER — Other Ambulatory Visit

## 2024-01-06 ENCOUNTER — Other Ambulatory Visit: Payer: Self-pay | Admitting: Medical Genetics

## 2024-01-06 VITALS — BP 128/78

## 2024-01-06 DIAGNOSIS — E1169 Type 2 diabetes mellitus with other specified complication: Secondary | ICD-10-CM

## 2024-01-06 NOTE — Progress Notes (Signed)
 01/06/2024 Name: Christina [name] MRN: [id number] DOB: 08/05/1965  Chief Complaint  Patient presents with   Medication Management   Diabetes    Christina [name] is a 58 y.o. year old female who presented for a telephone visit.   They were referred to the pharmacist by their PCP for assistance in managing diabetes.    Subjective:  Care Team: Primary Care Provider: Johnny Garnette LABOR, MD   Medication Access/Adherence  Current Pharmacy:  [facility] [address] [place] - [address] [place] [address] Phone: [phone] Fax: [phone]  [facility] [place], [place] - [address] [place] [address] Phone: [phone] Fax: [phone]   Patient reports affordability concerns with their medications: No  Patient reports access/transportation concerns to their pharmacy: No  Patient reports adherence concerns with their medications:  No     Diabetes:  Current medications: Ozempic  2mg , Metformin  XR 500mg , Farxiga  10mg  Medications tried in the past: Glipizide , Januvia   Current glucose readings: Reporting mostly controlled readings at home Unable to pull up her report to provide sugar averages, does not use app for remote connection  Reports that her daughter has now been placed in a nursing home, which has given patient more time to focus on healthier eating and taking care of her health  Gives Ozempic  on Sundays.   Switching to HTA, may need to switch to Mounjaro in Jan  Patient denies hypoglycemic s/sx including dizziness, shakiness, sweating. Patient denies hyperglycemic symptoms including polyuria, polydipsia, polyphagia, nocturia, neuropathy, blurred vision.   Current physical activity: limited due to pain  Current medication access support: Ozempic  through Novo, Farxiga  through AZ&Me  Hypertension:  Current medications: Metoprolol , Dilitiazem Medications previously tried: hydrochlorothiazide ,  Spironolactone   Patient has a validated, automated, upper arm home BP cuff Current blood pressure readings readings: 128/78 today  Patient denies hypotensive s/sx including dizziness, lightheadedness.  Patient denies hypertensive symptoms including headache, chest pain, shortness of breath     Objective:  Lab Results  Component Value Date   HGBA1C 7.4 (H) 11/24/2023    Lab Results  Component Value Date   CREATININE 0.87 11/24/2023   BUN 16 11/24/2023   NA 141 11/24/2023   K 4.0 11/24/2023   CL 100 11/24/2023   CO2 30 11/24/2023    Lab Results  Component Value Date   CHOL 198 11/24/2023   HDL 56.50 11/24/2023   LDLCALC 86 11/24/2023   LDLDIRECT 87.0 07/12/2021   TRIG 277.0 (H) 11/24/2023   CHOLHDL 4 11/24/2023    Medications Reviewed Today     Reviewed by Lionell Jon DEL, RPH (Pharmacist) on 01/06/24 at 1132  Med List Status: <None>   Medication Order Taking? Sig Documenting Provider Last Dose Status Informant  aspirin 81 MG EC tablet [id number]  Take 81 mg by mouth daily. [provider]  Active   atorvastatin  (LIPITOR) 10 MG tablet [id number]  Take 1 tablet (10 mg total) by mouth daily. Johnny Garnette LABOR, MD  Active   cholecalciferol (VITAMIN D) 1000 UNITS tablet [id number]  Take 1,000 Units by mouth daily. [provider]  Active   Cinnamon 500 MG TABS [id number]  Take 2 tablets by mouth daily. [provider]  Active   Continuous Glucose Receiver (FREESTYLE LIBRE 3 READER) DEVI [id number]  Use to monitor blood sugar continuously with freestyle 3 plus sensors Dx E11.69 Johnny Garnette LABOR, MD  Active   Continuous Glucose  Sensor (FREESTYLE LIBRE 3 PLUS SENSOR) MISC [id number]  Use to monitor blood sugar continuously. Change sensor every 15 days. Dx: E11.69 Johnny Garnette LABOR, MD  Active   cyclobenzaprine  (FLEXERIL ) 10 MG tablet [id number]  TAKE ONE TABLET BY MOUTH three times daily AS NEEDED FOR MUSCLE SPASMS Johnny Garnette LABOR, MD  Active   dapagliflozin   propanediol (FARXIGA ) 10 MG TABS tablet [id number]  Take 1 tablet (10 mg total) by mouth daily. Johnny Garnette LABOR, MD  Active   diltiazem  (CARDIZEM  CD) 360 MG 24 hr capsule [id number]  Take 1 capsule (360 mg total) by mouth daily. Johnny Garnette LABOR, MD  Active   diphenhydrAMINE (BENADRYL) 25 mg capsule [id number]  Take 25 mg by mouth as needed. [provider]  Active   EPINEPHrine  0.3 mg/0.3 mL IJ SOAJ injection [id number]  Inject 0.3 mg into the muscle as needed for anaphylaxis. Johnny Garnette LABOR, MD  Active   fenofibrate  160 MG tablet [id number]  Take 1 tablet (160 mg total) by mouth daily. Johnny Garnette LABOR, MD  Active   gabapentin  (NEURONTIN ) 300 MG capsule [id number]  TAKE TWO CAPSULES BY MOUTH THREE TIMES DAILY Johnny Garnette LABOR, MD  Active   levothyroxine  (SYNTHROID ) 50 MCG tablet [id number]  Take 1 tablet (50 mcg total) by mouth every morning. Johnny Garnette LABOR, MD  Active   Melatonin 10 MG CAPS [id number]  Take 10 mg by mouth at bedtime. [provider]  Active   metFORMIN  (GLUCOPHAGE -XR) 500 MG 24 hr tablet [id number]  Take 1 tablet (500 mg total) by mouth daily with breakfast. Johnny Garnette LABOR, MD  Active   metoprolol  succinate (TOPROL -XL) 100 MG 24 hr tablet [id number]  TAKE ONE TABLET BY MOUTH ONCE DAILY WITH A MEAL Johnny Garnette LABOR, MD  Active   omeprazole  (PRILOSEC) 20 MG capsule [id number]  Take 1 capsule (20 mg total) by mouth 2 (two) times daily. Johnny Garnette LABOR, MD  Active   OZEMPIC , 2 MG/DOSE, 8 MG/3ML NELMA [id number]  INJECT 2MG  INTO SKIN WEEKLY Johnny Garnette LABOR, MD  Active            Med Note JANEAN JON VEAR Stevan Apr 08, 2023  9:36 AM) sundays  Discontinued 09/22/11 0946 (Reorder)   potassium chloride  (KLOR-CON  M) 10 MEQ tablet [id number]  TAKE 1 TABLET EVERY DAY Johnny Garnette LABOR, MD  Active   promethazine  (PHENERGAN ) 25 MG tablet [id number]  Take 1 tablet (25 mg total) by mouth every 6 (six) hours as needed for nausea. Johnny Garnette LABOR, MD  Active   temazepam  (RESTORIL ) 30 MG capsule [id number]   Take 1 capsule (30 mg total) by mouth at bedtime. Johnny Garnette LABOR, MD  Active   traMADol  (ULTRAM ) 50 MG tablet [id number]  TAKE TWO TABLETS BY MOUTH THREE TIMES DAILY AS NEEDED Johnny Garnette LABOR, MD  Active   venlafaxine  XR (EFFEXOR -XR) 150 MG 24 hr capsule [id number]  Take 1 capsule (150 mg total) by mouth every morning. Johnny Garnette LABOR, MD  Active   vitamin B-12 (CYANOCOBALAMIN ) 1000 MCG tablet [id number]  Take 1,000 mcg by mouth daily. 2500 mcg once daily [provider]  Active Self  vitamin E 180 MG (400 UNITS) capsule [id number]  Take 400 Units by mouth daily. [provider]  Active Self              Assessment/Plan:   Diabetes: - Currently uncontrolled but improving - Reviewed long term cardiovascular and renal outcomes of uncontrolled blood sugar - Reviewed  goal A1c, goal fasting, and goal 2 hour post prandial glucose -Recommend low-carb diet (less pancakes for breakfast) and more protein -Continue current med therapy, wants to switch to [facility] for free delivery in Jan once new insurance kicks in. Reports mounjaro may be more affordable at this time, will price check  Hypertension: - Currently controlled - Reviewed long term cardiovascular and renal outcomes of uncontrolled blood pressure - Reviewed appropriate blood pressure monitoring technique and reviewed goal blood pressure. Recommended to check home blood pressure and heart rate dailly - Recommend to continue current therapy, consider addition of chlorthalidone if BP elevates       Follow Up Plan: 1 month  Jon VEAR Lindau, PharmD Clinical Pharmacist [phone]

## 2024-01-14 ENCOUNTER — Other Ambulatory Visit: Payer: Self-pay | Admitting: Family Medicine

## 2024-01-27 ENCOUNTER — Telehealth: Payer: Self-pay

## 2024-01-27 MED ORDER — AZITHROMYCIN 250 MG PO TABS: ORAL_TABLET | ORAL | 0 refills | Status: AC

## 2024-01-27 NOTE — Telephone Encounter (Signed)
I sent in a Zpack  

## 2024-01-27 NOTE — Telephone Encounter (Signed)
Spoke with pt aware to pick up Rx from her pharmacy 

## 2024-01-27 NOTE — Telephone Encounter (Signed)
 Copied from CRM #[id number]. Topic: Clinical - Medical Advice >> Jan 27, 2024  8:07 AM Christina [name] wrote: Reason for CRM: Patient called in wanted to know if something could be called stated she has cold symptoms, congestion runny nose. Patient states she lives out of town so not sure if she is able to come in would like a callback

## 2024-01-27 NOTE — Addendum Note (Signed)
 Addended by: JOHNNY SENIOR A on: 01/27/2024 12:48 PM   Modules accepted: Orders

## 2024-02-08 ENCOUNTER — Other Ambulatory Visit: Payer: Self-pay

## 2024-02-08 DIAGNOSIS — E1169 Type 2 diabetes mellitus with other specified complication: Secondary | ICD-10-CM

## 2024-02-08 MED ORDER — LEVOTHYROXINE SODIUM 50 MCG PO TABS: 50.0000 ug | ORAL_TABLET | Freq: Every morning | ORAL | 1 refills | Status: DC

## 2024-02-08 MED ORDER — VENLAFAXINE HCL ER 150 MG PO CP24: 150.0000 mg | ORAL_CAPSULE | Freq: Every morning | ORAL | 4 refills | Status: AC

## 2024-02-08 NOTE — Progress Notes (Signed)
 "  02/08/2024 Name: Christina [name] MRN: [id number] DOB: [birth year]  Chief Complaint  Patient presents with   Medication Management   Medication Assistance   Diabetes    Christina [name] is a 59 y.o. year old female who presented for a telephone visit.   They were referred to the pharmacist by their PCP for assistance in managing diabetes.    Subjective:  Care Team: Primary Care Provider: Johnny Garnette LABOR, MD   Medication Access/Adherence  Current Pharmacy:  [facility] [address], [place] - [address] [place] [address] Phone: [phone] Fax: [phone]  [facility] [place], [place] - [address] [place] [address] Phone: [phone] Fax: [phone]  [facility] [address], [place] - [address] [facility] [place] [address] Phone: [phone] Fax: [phone]   Patient reports affordability concerns with their medications: No  Patient reports access/transportation concerns to their pharmacy: No  Patient reports adherence concerns with their medications:  No     Diabetes:  Current medications: Ozempic  2mg , Metformin  XR 500mg , Farxiga  10mg  Medications tried in the past: Glipizide , Januvia   Current glucose readings: Reporting mostly controlled readings at home Unable to pull up her report to provide sugar averages, does not use app for remote connection  Reports that her daughter has now been placed in a nursing home, which has given patient more time to focus on healthier eating and taking care of her health  Patient denies hypoglycemic s/sx including dizziness, shakiness, sweating. Patient denies hyperglycemic symptoms including polyuria, polydipsia, polyphagia, nocturia, neuropathy, blurred vision.   Current physical activity: limited due to pain  Current medication access support: Farxiga  through AZ&Me  Aware that Ozempic  PAP has ended through Novo, now has HTA Plan  I PPO and is curious of cost of  GLPs with new insurance and if ozempic  will be affordable  Would like to switch to [facility] for delivery of meds for next refills     Objective:  Lab Results  Component Value Date   HGBA1C 7.4 (H) 11/24/2023    Lab Results  Component Value Date   CREATININE 0.87 11/24/2023   BUN 16 11/24/2023   NA 141 11/24/2023   K 4.0 11/24/2023   CL 100 11/24/2023   CO2 30 11/24/2023    Lab Results  Component Value Date   CHOL 198 11/24/2023   HDL 56.50 11/24/2023   LDLCALC 86 11/24/2023   LDLDIRECT 87.0 07/12/2021   TRIG 277.0 (H) 11/24/2023   CHOLHDL 4 11/24/2023    Medications Reviewed Today     Reviewed by Lionell Jon DEL, RPH (Pharmacist) on 02/08/24 at 1617  Med List Status: <None>   Medication Order Taking? Sig Documenting Provider Last Dose Status Informant  aspirin 81 MG EC tablet [id number]  Take 81 mg by mouth daily. [provider]  Active   atorvastatin  (LIPITOR) 10 MG tablet [id number]  Take 1 tablet by mouth once daily Johnny Garnette LABOR, MD  Active   azithromycin  (ZITHROMAX  Z-PAK) 250 MG tablet [id number]  As directed Johnny Garnette LABOR, MD  Active   cholecalciferol (VITAMIN D) 1000 UNITS tablet [id number]  Take 1,000 Units by mouth daily. [provider]  Active   Cinnamon 500 MG TABS [id number]  Take 2 tablets by mouth daily. [provider]  Active   Continuous Glucose Receiver (FREESTYLE LIBRE 3 READER) DEVI [id number]  Use to monitor blood  sugar continuously with freestyle 3 plus sensors Dx E11.69 Johnny Garnette LABOR, MD  Active   Continuous Glucose Sensor (FREESTYLE LIBRE 3 PLUS SENSOR) [place] [id number]  Use to monitor blood sugar continuously. Change sensor every 15 days. Dx: E11.69 Johnny Garnette LABOR, MD  Active   cyclobenzaprine  (FLEXERIL ) 10 MG tablet [id number]  TAKE ONE TABLET BY MOUTH three times daily AS NEEDED FOR MUSCLE SPASMS Johnny Garnette LABOR, MD  Active   dapagliflozin  propanediol (FARXIGA ) 10 MG TABS tablet [id number]   Take 1 tablet (10 mg total) by mouth daily. Johnny Garnette LABOR, MD  Active   diltiazem  (CARDIZEM  CD) 360 MG 24 hr capsule [id number]  Take 1 capsule (360 mg total) by mouth daily. Johnny Garnette LABOR, MD  Active   diphenhydrAMINE (BENADRYL) 25 mg capsule [id number]  Take 25 mg by mouth as needed. [provider]  Active   EPINEPHrine  0.3 mg/0.3 mL IJ SOAJ injection [id number]  Inject 0.3 mg into the muscle as needed for anaphylaxis. Johnny Garnette LABOR, MD  Active   fenofibrate  160 MG tablet [id number]  Take 1 tablet (160 mg total) by mouth daily. Johnny Garnette LABOR, MD  Active   gabapentin  (NEURONTIN ) 300 MG capsule [id number]  TAKE TWO CAPSULES BY MOUTH THREE TIMES DAILY Johnny Garnette LABOR, MD  Active   levothyroxine  (SYNTHROID ) 50 MCG tablet [id number]  Take 1 tablet (50 mcg total) by mouth every morning. Johnny Garnette LABOR, MD  Active   Melatonin 10 MG CAPS [id number]  Take 10 mg by mouth at bedtime. [provider]  Active   metFORMIN  (GLUCOPHAGE -XR) 500 MG 24 hr tablet [id number]  Take 1 tablet (500 mg total) by mouth daily with breakfast. Johnny Garnette LABOR, MD  Active   metoprolol  succinate (TOPROL -XL) 100 MG 24 hr tablet [id number]  TAKE ONE TABLET BY MOUTH ONCE DAILY WITH A MEAL Johnny Garnette LABOR, MD  Active   omeprazole  (PRILOSEC) 20 MG capsule [id number]  Take 1 capsule (20 mg total) by mouth 2 (two) times daily. Johnny Garnette LABOR, MD  Active   OZEMPIC , 2 MG/DOSE, 8 MG/3ML [facility] [id number]  INJECT 2MG  INTO SKIN WEEKLY Johnny Garnette LABOR, MD  Active            Med Note JANEAN JON VEAR Stevan Apr 08, 2023  9:36 AM) sundays  Discontinued 09/22/11 0946 (Reorder)   potassium chloride  (KLOR-CON  M) 10 MEQ tablet [id number]  TAKE 1 TABLET EVERY DAY Johnny Garnette LABOR, MD  Active   promethazine  (PHENERGAN ) 25 MG tablet [id number]  Take 1 tablet (25 mg total) by mouth every 6 (six) hours as needed for nausea. Johnny Garnette LABOR, MD  Active   temazepam  (RESTORIL ) 30 MG capsule [id number]  Take 1 capsule (30 mg total) by mouth at bedtime.  Johnny Garnette LABOR, MD  Active   traMADol  (ULTRAM ) 50 MG tablet [id number]  TAKE TWO TABLETS BY MOUTH THREE TIMES DAILY AS NEEDED Johnny Garnette LABOR, MD  Active   venlafaxine  XR (EFFEXOR -XR) 150 MG 24 hr capsule [id number]  Take 1 capsule (150 mg total) by mouth every morning. Johnny Garnette LABOR, MD  Active   vitamin B-12 (CYANOCOBALAMIN ) 1000 MCG tablet [id number]  Take 1,000 mcg by mouth daily. 2500 mcg once daily [provider]  Active Self  vitamin E 180 MG (400 UNITS) capsule [id number]  Take 400 Units by mouth daily. [provider]  Active Self              Assessment/Plan:   Diabetes: -  Currently uncontrolled but improving - Reviewed long term cardiovascular and renal outcomes of uncontrolled blood sugar - Reviewed goal A1c, goal fasting, and goal 2 hour post prandial glucose -Recommend low-carb diet (less pancakes for breakfast) and more protein -Continue current med therapy (finish out Ozempic  2mg  supply - has 2 months on hand) -Discussed prices of Ozempic  and Mounjaro with HTA plan, still $$. Will discuss with spouse if she will pursue with pharmacy. If too expensive, can consider enrolling in Trulicity PAP with PCP sign off   Follow Up Plan: 1 week  Jon VEAR Lindau, PharmD Clinical Pharmacist [phone]      "

## 2024-02-10 ENCOUNTER — Other Ambulatory Visit: Payer: Self-pay | Admitting: Family Medicine

## 2024-02-15 ENCOUNTER — Other Ambulatory Visit: Payer: Self-pay

## 2024-02-15 DIAGNOSIS — E1169 Type 2 diabetes mellitus with other specified complication: Secondary | ICD-10-CM

## 2024-02-15 NOTE — Progress Notes (Signed)
 "  02/15/2024 Name: Christina [name] MRN: [id number] DOB: 12-20-1965  Chief Complaint  Patient presents with   Medication Management   Diabetes    Christina [name] is a 59 y.o. year old female who presented for a telephone visit.   They were referred to the pharmacist by their PCP for assistance in managing diabetes.    Subjective:  Care Team: Primary Care Provider: Johnny Garnette LABOR, MD   Medication Access/Adherence  Current Pharmacy:  [facility] [address] [place] - [address] [place] [address] Phone: [phone] Fax: [phone]  [facility] - [place], [place] - [address] [place] [address] Phone: [phone] Fax: [phone]  [facility] [address] [place] - [address] [facility] [place] [address] Phone: [phone] Fax: [phone]   Patient reports affordability concerns with their medications: No  Patient reports access/transportation concerns to their pharmacy: No  Patient reports adherence concerns with their medications:  No     Diabetes:  Current medications: Ozempic  2mg , Metformin  XR 500mg , Farxiga  10mg  Medications tried in the past: Glipizide , Januvia   Current glucose readings: Reporting mostly controlled readings at home Unable to pull up her report to provide sugar averages, does not use app for remote connection  Reports that her daughter has now been placed in a nursing home, which has given patient more time to focus on healthier eating and taking care of her health  Patient denies hypoglycemic s/sx including dizziness, shakiness, sweating. Patient denies hyperglycemic symptoms including polyuria, polydipsia, polyphagia, nocturia, neuropathy, blurred vision.   Current physical activity: limited due to pain  Current medication access support: Farxiga  through [employer]  Aware that Ozempic  PAP has ended through [employer], now has HTA Plan I PPO and is curious of  cost of  GLPs with new insurance and if ozempic  will be affordable  Decided she would like to stay with [facility] for now     Objective:  Lab Results  Component Value Date   HGBA1C 7.4 (H) 11/24/2023    Lab Results  Component Value Date   CREATININE 0.87 11/24/2023   BUN 16 11/24/2023   NA 141 11/24/2023   K 4.0 11/24/2023   CL 100 11/24/2023   CO2 30 11/24/2023    Lab Results  Component Value Date   CHOL 198 11/24/2023   HDL 56.50 11/24/2023   LDLCALC 86 11/24/2023   LDLDIRECT 87.0 07/12/2021   TRIG 277.0 (H) 11/24/2023   CHOLHDL 4 11/24/2023    Medications Reviewed Today     Reviewed by Lionell Jon DEL, RPH (Pharmacist) on 02/15/24 at 1119  Med List Status: <None>   Medication Order Taking? Sig Documenting Provider Last Dose Status Informant  aspirin 81 MG EC tablet [id number]  Take 81 mg by mouth daily. [provider]  Active   atorvastatin  (LIPITOR) 10 MG tablet [id number]  Take 1 tablet by mouth once daily Johnny Garnette LABOR, MD  Active   azithromycin  (ZITHROMAX  Z-PAK) 250 MG tablet [id number]  As directed Johnny Garnette LABOR, MD  Active   cholecalciferol (VITAMIN D) 1000 UNITS tablet [id number]  Take 1,000 Units by mouth daily. [provider]  Active   Cinnamon 500 MG TABS [id number]  Take 2 tablets by mouth daily. [provider]  Active   Continuous Glucose Receiver (FREESTYLE LIBRE 3 READER) DEVI [id number]  Use to monitor blood sugar continuously with freestyle 3 plus  sensors Dx E11.69 Johnny Garnette LABOR, MD  Active   Continuous Glucose Sensor (FREESTYLE LIBRE 3 PLUS SENSOR) [place] [id number]  Use to monitor blood sugar continuously. Change sensor every 15 days. Dx: E11.69 Johnny Garnette LABOR, MD  Active   cyclobenzaprine  (FLEXERIL ) 10 MG tablet [id number]  TAKE ONE TABLET BY MOUTH three times daily AS NEEDED FOR MUSCLE SPASMS Johnny Garnette LABOR, MD  Active   dapagliflozin  propanediol (FARXIGA ) 10 MG TABS tablet [id number]  Take 1 tablet (10 mg total) by  mouth daily. Johnny Garnette LABOR, MD  Active   diltiazem  (CARDIZEM  CD) 360 MG 24 hr capsule [id number]  Take 1 capsule (360 mg total) by mouth daily. Johnny Garnette LABOR, MD  Active   diphenhydrAMINE (BENADRYL) 25 mg capsule [id number]  Take 25 mg by mouth as needed. [provider]  Active   EPINEPHrine  0.3 mg/0.3 mL IJ SOAJ injection [id number]  Inject 0.3 mg into the muscle as needed for anaphylaxis. Johnny Garnette LABOR, MD  Active   fenofibrate  160 MG tablet [id number]  Take 1 tablet (160 mg total) by mouth daily. Johnny Garnette LABOR, MD  Active   gabapentin  (NEURONTIN ) 300 MG capsule [id number]  TAKE TWO CAPSULES BY MOUTH THREE TIMES DAILY Johnny Garnette LABOR, MD  Active   levothyroxine  (SYNTHROID ) 50 MCG tablet [id number]  Take 1 tablet (50 mcg total) by mouth every morning. Johnny Garnette LABOR, MD  Active   Melatonin 10 MG CAPS [id number]  Take 10 mg by mouth at bedtime. [provider]  Active   metFORMIN  (GLUCOPHAGE -XR) 500 MG 24 hr tablet [id number]  Take 1 tablet (500 mg total) by mouth daily with breakfast. Johnny Garnette LABOR, MD  Active   metoprolol  succinate (TOPROL -XL) 100 MG 24 hr tablet [id number]  TAKE ONE TABLET BY MOUTH ONCE DAILY WITH A MEAL Johnny Garnette LABOR, MD  Active   omeprazole  (PRILOSEC) 20 MG capsule [id number]  Take 1 capsule (20 mg total) by mouth 2 (two) times daily. Johnny Garnette LABOR, MD  Active   OZEMPIC , 2 MG/DOSE, 8 MG/3ML [facility] [id number]  INJECT 2MG  INTO SKIN WEEKLY Johnny Garnette LABOR, MD  Active            Med Note JANEAN JON VEAR Stevan Apr 08, 2023  9:36 AM) sundays  Discontinued 09/22/11 0946 (Reorder)   potassium chloride  (KLOR-CON  M) 10 MEQ tablet [id number]  TAKE 1 TABLET EVERY DAY Johnny Garnette LABOR, MD  Active   promethazine  (PHENERGAN ) 25 MG tablet [id number]  Take 1 tablet (25 mg total) by mouth every 6 (six) hours as needed for nausea. Johnny Garnette LABOR, MD  Active   temazepam  (RESTORIL ) 30 MG capsule [id number]  Take 1 capsule by mouth at bedtime Johnny Garnette LABOR, MD  Active   traMADol  (ULTRAM )  50 MG tablet [id number]  TAKE TWO TABLETS BY MOUTH THREE TIMES DAILY AS NEEDED Johnny Garnette LABOR, MD  Active   venlafaxine  XR (EFFEXOR -XR) 150 MG 24 hr capsule [id number]  Take 1 capsule (150 mg total) by mouth every morning. Johnny Garnette LABOR, MD  Active   vitamin B-12 (CYANOCOBALAMIN ) 1000 MCG tablet [id number]  Take 1,000 mcg by mouth daily. 2500 mcg once daily [provider]  Active Self  vitamin E 180 MG (400 UNITS) capsule [id number]  Take 400 Units by mouth daily. [provider]  Active Self              Assessment/Plan:   Diabetes: - Currently uncontrolled but improving - Reviewed long term  cardiovascular and renal outcomes of uncontrolled blood sugar - Reviewed goal A1c, goal fasting, and goal 2 hour post prandial glucose -Recommend low-carb diet (less pancakes for breakfast) and more protein -Continue current med therapy (finish out Ozempic  2mg  supply - has 3 months on hand) -Discussed prices of Ozempic  and Mounjaro with HTA plan, still $$. Will discuss with spouse if she will pursue with pharmacy. If too expensive, can consider enrolling in Trulicity PAP with PCP sign off   Follow Up Plan: 2 weeks  Jon VEAR Lindau, PharmD Clinical Pharmacist [phone]      "

## 2024-02-16 ENCOUNTER — Other Ambulatory Visit: Payer: Self-pay

## 2024-02-16 DIAGNOSIS — E785 Hyperlipidemia, unspecified: Secondary | ICD-10-CM

## 2024-02-16 DIAGNOSIS — M5442 Lumbago with sciatica, left side: Secondary | ICD-10-CM

## 2024-02-16 MED ORDER — DAPAGLIFLOZIN PROPANEDIOL 10 MG PO TABS
10.0000 mg | ORAL_TABLET | Freq: Every day | ORAL | 3 refills | Status: AC
  Filled 2024-02-16 – 2024-02-17 (×2): qty 90, 90d supply, fill #0

## 2024-02-16 MED ORDER — DILTIAZEM HCL ER COATED BEADS 360 MG PO CP24
360.0000 mg | ORAL_CAPSULE | Freq: Every day | ORAL | 3 refills | Status: AC
  Filled 2024-02-16 – 2024-02-17 (×2): qty 90, 90d supply, fill #0

## 2024-02-16 MED ORDER — FENOFIBRATE 160 MG PO TABS
160.0000 mg | ORAL_TABLET | Freq: Every day | ORAL | 1 refills | Status: AC
  Filled 2024-02-16 – 2024-02-17 (×2): qty 90, 90d supply, fill #0

## 2024-02-16 MED ORDER — CYCLOBENZAPRINE HCL 10 MG PO TABS
ORAL_TABLET | ORAL | 1 refills | Status: AC
  Filled 2024-02-16 – 2024-02-17 (×2): qty 270, 90d supply, fill #0

## 2024-02-16 MED ORDER — OMEPRAZOLE 20 MG PO CPDR
20.0000 mg | DELAYED_RELEASE_CAPSULE | Freq: Two times a day (BID) | ORAL | 3 refills | Status: AC
  Filled 2024-02-16: qty 180, 90d supply, fill #0

## 2024-02-16 MED ORDER — GABAPENTIN 300 MG PO CAPS
ORAL_CAPSULE | ORAL | 3 refills | Status: AC
  Filled 2024-02-16: qty 360, 90d supply, fill #0

## 2024-02-16 MED ORDER — ATORVASTATIN CALCIUM 10 MG PO TABS
10.0000 mg | ORAL_TABLET | Freq: Every day | ORAL | 0 refills | Status: AC
  Filled 2024-02-16 – 2024-02-17 (×2): qty 90, 90d supply, fill #0

## 2024-02-16 MED ORDER — LEVOTHYROXINE SODIUM 50 MCG PO TABS
50.0000 ug | ORAL_TABLET | Freq: Every morning | ORAL | 1 refills | Status: AC
  Filled 2024-02-16 – 2024-02-17 (×2): qty 90, 90d supply, fill #0

## 2024-02-16 MED ORDER — METOPROLOL SUCCINATE ER 100 MG PO TB24
ORAL_TABLET | ORAL | 3 refills | Status: AC
  Filled 2024-02-16: qty 90, 90d supply, fill #0

## 2024-02-16 MED ORDER — OZEMPIC (2 MG/DOSE) 8 MG/3ML ~~LOC~~ SOPN
2.0000 mg | PEN_INJECTOR | SUBCUTANEOUS | 2 refills | Status: DC
  Filled 2024-02-16: qty 3, 28d supply, fill #0

## 2024-02-16 MED ORDER — METFORMIN HCL ER 500 MG PO TB24
500.0000 mg | ORAL_TABLET | Freq: Every day | ORAL | 3 refills | Status: AC
  Filled 2024-02-16: qty 90, 90d supply, fill #0

## 2024-02-16 MED ORDER — POTASSIUM CHLORIDE CRYS ER 10 MEQ PO TBCR
10.0000 meq | EXTENDED_RELEASE_TABLET | Freq: Every day | ORAL | 3 refills | Status: AC
  Filled 2024-02-16: qty 90, 90d supply, fill #0

## 2024-02-17 ENCOUNTER — Other Ambulatory Visit: Payer: Self-pay

## 2024-02-17 ENCOUNTER — Other Ambulatory Visit (HOSPITAL_COMMUNITY): Payer: Self-pay

## 2024-02-26 ENCOUNTER — Other Ambulatory Visit (HOSPITAL_COMMUNITY): Payer: Self-pay

## 2024-02-26 ENCOUNTER — Telehealth: Payer: Self-pay | Admitting: Family Medicine

## 2024-02-26 ENCOUNTER — Other Ambulatory Visit

## 2024-02-26 DIAGNOSIS — E1169 Type 2 diabetes mellitus with other specified complication: Secondary | ICD-10-CM

## 2024-02-26 DIAGNOSIS — Z7985 Long-term (current) use of injectable non-insulin antidiabetic drugs: Secondary | ICD-10-CM

## 2024-02-26 DIAGNOSIS — Z7984 Long term (current) use of oral hypoglycemic drugs: Secondary | ICD-10-CM

## 2024-02-26 MED ORDER — TRULICITY 4.5 MG/0.5ML ~~LOC~~ SOAJ
4.5000 mg | SUBCUTANEOUS | 11 refills | Status: AC
  Filled 2024-02-26: qty 2, 28d supply, fill #0

## 2024-02-26 NOTE — Telephone Encounter (Signed)
 Due to a change in insurance coverage, we changed from Ozempic  to Trulicity 

## 2024-02-26 NOTE — Progress Notes (Signed)
 "  02/26/2024 Name: Christina [name] MRN: [id number] DOB: 02/04/1965  Chief Complaint  Patient presents with   Medication Management   Medication Assistance   Diabetes    FATIM VANDERSCHAAF is a 59 y.o. year old female who presented for a telephone visit.   They were referred to the pharmacist by their PCP for assistance in managing diabetes.    Subjective:  Care Team: Primary Care Provider: Johnny Garnette LABOR, MD   Medication Access/Adherence  Current Pharmacy:  [facility] [address], [place] - [address] [place] [address] Phone: [phone] Fax: [phone]  [facility] - [place], [place] - [address] [place] [address] Phone: [phone] Fax: [phone]  [facility] [address], [place] - [address] [place] [address] Phone: [phone] Fax: [phone]  [facility] [address] [place] [address] Phone: [phone] Fax: [phone]   Patient reports affordability concerns with their medications: No  Patient reports access/transportation concerns to their pharmacy: No  Patient reports adherence concerns with their medications:  No     Diabetes:  Current medications: Ozempic  2mg , Metformin  XR 500mg , Farxiga  10mg  Medications tried in the past: Glipizide , Januvia   Current glucose readings: Reporting mostly controlled readings at home Unable to pull up her report to provide sugar averages, does not use app for remote connection  Reports that her daughter has now been placed in a nursing home, which has given patient more time to focus on healthier eating and taking care of her health  Patient denies hypoglycemic s/sx including dizziness, shakiness, sweating. Patient denies hyperglycemic symptoms including polyuria, polydipsia, polyphagia, nocturia, neuropathy, blurred vision.   Current physical activity: limited due to  pain  Current medication access support: Farxiga  through [employer]  Aware that Ozempic  PAP has ended through [employer]  She reports this is not affordable through insurance at this time and would like to pursue any alternative trulicity  through patient assistance.     Objective:  Lab Results  Component Value Date   HGBA1C 7.4 (H) 11/24/2023    Lab Results  Component Value Date   CREATININE 0.87 11/24/2023   BUN 16 11/24/2023   NA 141 11/24/2023   K 4.0 11/24/2023   CL 100 11/24/2023   CO2 30 11/24/2023    Lab Results  Component Value Date   CHOL 198 11/24/2023   HDL 56.50 11/24/2023   LDLCALC 86 11/24/2023   LDLDIRECT 87.0 07/12/2021   TRIG 277.0 (H) 11/24/2023   CHOLHDL 4 11/24/2023    Medications Reviewed Today     Reviewed by Lionell Jon DEL, RPH (Pharmacist) on 02/26/24 at 1309  Med List Status: <None>   Medication Order Taking? Sig Documenting Provider Last Dose Status Informant  aspirin 81 MG EC tablet [id number]  Take 81 mg by mouth daily. [provider]  Active   atorvastatin  (LIPITOR) 10 MG tablet [id number]  Take 1 tablet (10 mg total) by mouth daily. Johnny Garnette LABOR, MD  Active   azithromycin  (ZITHROMAX  Z-PAK) 250 MG tablet [id number]  As directed Johnny Garnette LABOR, MD  Active   cholecalciferol (VITAMIN D) 1000 UNITS tablet [id number]  Take 1,000 Units by mouth daily. [provider]  Active   Cinnamon 500 MG TABS [id number]  Take 2 tablets by mouth daily. [provider]  Active   Continuous Glucose Receiver (FREESTYLE LIBRE 3 READER)  DEVI [id number]  Use to monitor blood sugar continuously with freestyle 3 plus sensors Dx E11.69 Johnny Garnette LABOR, MD  Active   Continuous Glucose Sensor (FREESTYLE LIBRE 3 PLUS SENSOR) [place] [id number]  Use to monitor blood sugar continuously. Change sensor every 15 days. Dx: E11.69 Johnny Garnette LABOR, MD  Active   cyclobenzaprine  (FLEXERIL ) 10 MG tablet [id number]  TAKE ONE TABLET BY MOUTH three times daily AS NEEDED FOR  MUSCLE SPASMS Johnny Garnette LABOR, MD  Active   dapagliflozin  propanediol (FARXIGA ) 10 MG TABS tablet [id number]  Take 1 tablet (10 mg total) by mouth daily. Johnny Garnette LABOR, MD  Active   diltiazem  (CARDIZEM  CD) 360 MG 24 hr capsule [id number]  Take 1 capsule (360 mg total) by mouth daily. Johnny Garnette LABOR, MD  Active   diphenhydrAMINE (BENADRYL) 25 mg capsule [id number]  Take 25 mg by mouth as needed. [provider]  Active   EPINEPHrine  0.3 mg/0.3 mL IJ SOAJ injection [id number]  Inject 0.3 mg into the muscle as needed for anaphylaxis. Johnny Garnette LABOR, MD  Active   fenofibrate  160 MG tablet [id number]  Take 1 tablet (160 mg total) by mouth daily. Johnny Garnette LABOR, MD  Active   gabapentin  (NEURONTIN ) 300 MG capsule [id number]  TAKE TWO CAPSULES BY MOUTH THREE TIMES DAILY Johnny Garnette LABOR, MD  Active   levothyroxine  (SYNTHROID ) 50 MCG tablet [id number]  Take 1 tablet (50 mcg total) by mouth every morning. Johnny Garnette LABOR, MD  Active   Melatonin 10 MG CAPS [id number]  Take 10 mg by mouth at bedtime. [provider]  Active   metFORMIN  (GLUCOPHAGE -XR) 500 MG 24 hr tablet [id number]  Take 1 tablet (500 mg total) by mouth daily with breakfast. Johnny Garnette LABOR, MD  Active   metoprolol  succinate (TOPROL -XL) 100 MG 24 hr tablet [id number]  TAKE ONE TABLET BY MOUTH ONCE DAILY WITH A MEAL Johnny Garnette LABOR, MD  Active   omeprazole  (PRILOSEC) 20 MG capsule [id number]  Take 1 capsule (20 mg total) by mouth 2 (two) times daily. Johnny Garnette LABOR, MD  Active   Discontinued 09/22/11 [id number] (Reorder)   potassium chloride  (KLOR-CON  M) 10 MEQ tablet [id number]  Take 1 tablet (10 mEq total) by mouth daily. Johnny Garnette LABOR, MD  Active   promethazine  (PHENERGAN ) 25 MG tablet [id number]  Take 1 tablet (25 mg total) by mouth every 6 (six) hours as needed for nausea. Johnny Garnette LABOR, MD  Active   Semaglutide , 2 MG/DOSE, (OZEMPIC , 2 MG/DOSE,) 8 MG/3ML SOPN [id number]  Inject 2 mg as directed once a week. Johnny Garnette LABOR, MD  Active   temazepam   (RESTORIL ) 30 MG capsule [id number]  Take 1 capsule by mouth at bedtime Johnny Garnette LABOR, MD  Active   traMADol  (ULTRAM ) 50 MG tablet [id number]  TAKE TWO TABLETS BY MOUTH THREE TIMES DAILY AS NEEDED Johnny Garnette LABOR, MD  Active   venlafaxine  XR (EFFEXOR -XR) 150 MG 24 hr capsule [id number]  Take 1 capsule (150 mg total) by mouth every morning. Johnny Garnette LABOR, MD  Active   vitamin B-12 (CYANOCOBALAMIN ) 1000 MCG tablet [id number]  Take 1,000 mcg by mouth daily. 2500 mcg once daily [provider]  Active Self  vitamin E 180 MG (400 UNITS) capsule [id number]  Take 400 Units by mouth daily. [provider]  Active Self              Assessment/Plan:   Diabetes: - Currently uncontrolled but improving - Reviewed long term cardiovascular and  renal outcomes of uncontrolled blood sugar - Reviewed goal A1c, goal fasting, and goal 2 hour post prandial glucose -Recommend low-carb diet (less pancakes for breakfast) and more protein -Continue current med therapy (finish out Ozempic  2mg  supply - has 3 months on hand) -Will coordinate Trulicity  PAP with PCP to replace Ozempic  when patient runs out. Patient requests application be mailed out   Follow Up Plan: 4 weeks  Jon VEAR Lindau, PharmD Clinical Pharmacist [phone]      "

## 2024-02-29 ENCOUNTER — Other Ambulatory Visit

## 2024-02-29 ENCOUNTER — Other Ambulatory Visit (HOSPITAL_COMMUNITY): Payer: Self-pay

## 2024-03-04 ENCOUNTER — Telehealth: Payer: Self-pay

## 2024-03-04 NOTE — Telephone Encounter (Signed)
 PAP: Patient assistance application for Trulicity through [employer] has been mailed to pt's home address on file. Provider portion of application will be faxed to provider's office.

## 2024-03-28 ENCOUNTER — Other Ambulatory Visit

## 2024-07-22 ENCOUNTER — Ambulatory Visit
